# Patient Record
Sex: Female | Born: 1970
Health system: Southern US, Community
[De-identification: ages and names within clinical notes are randomized; demographics above are authoritative.]

## PROBLEM LIST (undated history)

## (undated) DIAGNOSIS — Q231 Congenital insufficiency of aortic valve: Secondary | ICD-10-CM

## (undated) DIAGNOSIS — Q2381 Bicuspid aortic valve: Secondary | ICD-10-CM

## (undated) DIAGNOSIS — E119 Type 2 diabetes mellitus without complications: Secondary | ICD-10-CM

## (undated) DIAGNOSIS — Q230 Congenital stenosis of aortic valve: Secondary | ICD-10-CM

## (undated) DIAGNOSIS — I350 Nonrheumatic aortic (valve) stenosis: Secondary | ICD-10-CM

## (undated) DIAGNOSIS — I10 Essential (primary) hypertension: Secondary | ICD-10-CM

## (undated) DIAGNOSIS — Z5189 Encounter for other specified aftercare: Secondary | ICD-10-CM

## (undated) DIAGNOSIS — R002 Palpitations: Secondary | ICD-10-CM

## (undated) DIAGNOSIS — Z90710 Acquired absence of both cervix and uterus: Secondary | ICD-10-CM

## (undated) HISTORY — DX: Essential (primary) hypertension: I10

## (undated) HISTORY — DX: Encounter for other specified aftercare: Z51.89

## (undated) HISTORY — DX: Bicuspid aortic valve: Q23.81

## (undated) HISTORY — DX: Congenital stenosis of aortic valve: Q23.1

## (undated) HISTORY — PX: TUBAL LIGATION: SHX77

## (undated) HISTORY — DX: Nonrheumatic aortic (valve) stenosis: I35.0

## (undated) HISTORY — DX: Congenital stenosis of aortic valve: Q23.0

## (undated) HISTORY — DX: Palpitations: R00.2

## (undated) HISTORY — PX: ABDOMINAL HYSTERECTOMY: SHX81

## (undated) HISTORY — PX: CARDIAC CATHETERIZATION: SHX172

---

## 2006-06-10 ENCOUNTER — Other Ambulatory Visit: Admission: RE | Admit: 2006-06-10 | Discharge: 2006-06-10 | Payer: Self-pay | Admitting: Unknown Physician Specialty

## 2006-06-10 ENCOUNTER — Encounter (INDEPENDENT_AMBULATORY_CARE_PROVIDER_SITE_OTHER): Payer: Self-pay | Admitting: Unknown Physician Specialty

## 2010-11-27 ENCOUNTER — Encounter: Payer: Self-pay | Admitting: Cardiology

## 2010-11-28 ENCOUNTER — Encounter: Payer: Self-pay | Admitting: Cardiology

## 2010-11-28 ENCOUNTER — Ambulatory Visit (INDEPENDENT_AMBULATORY_CARE_PROVIDER_SITE_OTHER): Payer: Medicaid Other | Admitting: Cardiology

## 2010-11-28 VITALS — BP 125/85 | HR 76 | Ht 69.0 in | Wt 243.0 lb

## 2010-11-28 DIAGNOSIS — E669 Obesity, unspecified: Secondary | ICD-10-CM

## 2010-11-28 DIAGNOSIS — I10 Essential (primary) hypertension: Secondary | ICD-10-CM

## 2010-11-28 DIAGNOSIS — R011 Cardiac murmur, unspecified: Secondary | ICD-10-CM

## 2010-11-28 DIAGNOSIS — Z136 Encounter for screening for cardiovascular disorders: Secondary | ICD-10-CM

## 2010-11-28 DIAGNOSIS — R002 Palpitations: Secondary | ICD-10-CM | POA: Insufficient documentation

## 2010-11-28 DIAGNOSIS — Z6834 Body mass index (BMI) 34.0-34.9, adult: Secondary | ICD-10-CM | POA: Insufficient documentation

## 2010-11-28 NOTE — Assessment & Plan Note (Signed)
Systolic murmur, likely benign. Could not appreciate opening snap and second heart sound is normal. ECG without right bundle branch pattern. Need to exclude bicuspid aortic valve. 2-D echocardiogram will be ordered.

## 2010-11-28 NOTE — Patient Instructions (Signed)
Your follow up will be based on your test results. Your physician recommends that you continue on your current medications as directed. Please refer to the Current Medication list given to you today. Your physician has requested that you have an echocardiogram. Echocardiography is a painless test that uses sound waves to create images of your heart. It provides your doctor with information about the size and shape of your heart and how well your heart's chambers and valves are working. This procedure takes approximately one hour. There are no restrictions for this procedure. If the results of your test are normal or stable, you will receive a letter. If they are abnormal, the nurse will contact you by phone.

## 2010-11-28 NOTE — Assessment & Plan Note (Signed)
Blood pressure is reasonably well controlled today. 

## 2010-11-28 NOTE — Assessment & Plan Note (Signed)
Brief, not associated with dizziness or syncope. Patient states she is not particularly bothered by these. No further evaluation at this point unless symptoms progress.

## 2010-11-28 NOTE — Assessment & Plan Note (Signed)
We did discuss diet and weight loss.

## 2010-11-28 NOTE — Progress Notes (Signed)
Clinical Summary Ms. Cenci is a 40 y.o.female referred for cardiology consultation from the [facility]. She was noted to have a heart murmur on recent examination. History includes hypertension over the last several months by report, recently on antihypertensive medication. She also reports a history of brief palpitations, not associated with dizziness or syncope. She states that this occurs sporadically during the day, and is not particularly bothersome to her.  She is not aware of any history of heart murmur. No family history of congenital heart disease or valvular heart disease by report.  She reports no exertional chest pain or shortness of breath. 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 No orthopnea or PND.  No Known Allergies  Medication list reviewed.  Past Medical History  Diagnosis Date  . Essential hypertension, benign   . Palpitations     Past Surgical History  Procedure Date  . Cesarean section 2009    Family History  Problem Relation Age of Onset  . Diabetes Mother   . Brain cancer Brother   . Cancer Maternal Grandmother     Social History Ms. Stallings reports that she has never smoked. She has never used smokeless tobacco. Ms. Roulhac reports that she does not drink alcohol.  Review of Systems As outlined above. No history of TIA or stroke. No orthopnea or PND. Stable appetite. Otherwise negative.  Physical Examination Filed Vitals:   11/28/10 0857  BP: 125/85  Pulse: 76   Obese woman in no acute distress. HEENT: Conjunctiva and lids normal, oropharynx clear. Neck: Supple, no bruits, no elevated JVP, no thyromegaly. Lungs: Clear to auscultation, nonlabored. Cardiac: Regular rate and rhythm, 2/6 systolic murmur heard best at the right base, no diastolic murmur, no rub or gallop. Does not change in intensity on standing. Abdomen: Soft, nontender, bowel sounds present. Skin: Warm and dry. Extremities: No pitting edema,  distal pulses full. No cyanosis. Musculoskeletal: No kyphosis. Neuropsychiatric: Alert and oriented x3, affect appropriate.  ECG Reviewed in EMR.   Problem List and Plan

## 2010-12-13 ENCOUNTER — Other Ambulatory Visit (INDEPENDENT_AMBULATORY_CARE_PROVIDER_SITE_OTHER): Payer: Medicaid Other | Admitting: *Deleted

## 2010-12-13 DIAGNOSIS — R002 Palpitations: Secondary | ICD-10-CM

## 2010-12-13 DIAGNOSIS — R011 Cardiac murmur, unspecified: Secondary | ICD-10-CM

## 2010-12-17 ENCOUNTER — Telehealth: Payer: Self-pay | Admitting: *Deleted

## 2010-12-17 NOTE — Telephone Encounter (Signed)
Patient informed. 

## 2010-12-17 NOTE — Telephone Encounter (Signed)
Message copied by Eustace Moore on Mon Dec 17, 2010  9:29 AM ------      Message from: Eustace Moore      Created: Fri Dec 14, 2010  4:11 PM                   ----- Message -----         From: Jonelle Sidle, MD         Sent: 12/13/2010   9:40 PM           To: Hoover Brunette, LPN            Reviewed report. Aortic valve is described as trileaflet and mildly calcified, with mildly increased mean gradient of 15 mm mercury. Perhaps very mildly stenotic and an explanation for her murmur, however not of major clinical significance at this time. This can most likely be followed on serial exam by her primary care provider.

## 2012-06-05 ENCOUNTER — Other Ambulatory Visit (HOSPITAL_COMMUNITY): Payer: Self-pay | Admitting: Nurse Practitioner

## 2012-06-05 DIAGNOSIS — Z139 Encounter for screening, unspecified: Secondary | ICD-10-CM

## 2012-06-29 ENCOUNTER — Ambulatory Visit (HOSPITAL_COMMUNITY)
Admission: RE | Admit: 2012-06-29 | Discharge: 2012-06-29 | Disposition: A | Discharge status: Home / Self Care | Payer: Medicaid Other | Source: Ambulatory Visit | Attending: Nurse Practitioner | Admitting: Nurse Practitioner

## 2012-06-29 DIAGNOSIS — Z139 Encounter for screening, unspecified: Secondary | ICD-10-CM

## 2012-06-29 DIAGNOSIS — Z1231 Encounter for screening mammogram for malignant neoplasm of breast: Secondary | ICD-10-CM | POA: Insufficient documentation

## 2013-07-12 ENCOUNTER — Other Ambulatory Visit (HOSPITAL_COMMUNITY): Payer: Self-pay | Admitting: Nurse Practitioner

## 2013-07-12 DIAGNOSIS — Z1231 Encounter for screening mammogram for malignant neoplasm of breast: Secondary | ICD-10-CM

## 2013-07-23 ENCOUNTER — Ambulatory Visit (HOSPITAL_COMMUNITY): Payer: Medicaid Other

## 2013-07-29 ENCOUNTER — Ambulatory Visit (HOSPITAL_COMMUNITY)
Admission: RE | Admit: 2013-07-29 | Discharge: 2013-07-29 | Disposition: A | Discharge status: Home / Self Care | Payer: Medicaid Other | Source: Ambulatory Visit | Attending: Nurse Practitioner | Admitting: Nurse Practitioner

## 2013-07-29 DIAGNOSIS — Z1231 Encounter for screening mammogram for malignant neoplasm of breast: Secondary | ICD-10-CM | POA: Diagnosis not present

## 2013-10-27 ENCOUNTER — Ambulatory Visit (INDEPENDENT_AMBULATORY_CARE_PROVIDER_SITE_OTHER): Payer: BC Managed Care – PPO | Admitting: Cardiology

## 2013-10-27 ENCOUNTER — Encounter: Payer: Self-pay | Admitting: Cardiology

## 2013-10-27 VITALS — BP 128/78 | HR 82 | Ht 68.0 in | Wt 223.1 lb

## 2013-10-27 DIAGNOSIS — I10 Essential (primary) hypertension: Secondary | ICD-10-CM

## 2013-10-27 DIAGNOSIS — I350 Nonrheumatic aortic (valve) stenosis: Secondary | ICD-10-CM | POA: Insufficient documentation

## 2013-10-27 NOTE — Progress Notes (Signed)
   Anna Salinas Date of Birth: [birth year] Medical Record #[id number]  History of Present Illness: Anna Salinas is seen today for evaluation of murmur. She is a 43 yo WF with history of HTN and obesity. She has a history of a murmur evaluated by Dr Domenic Polite in 2012. An Echo was obtained which showed calcification of the aortic valve which appeared trileaflet. Mean gradient was 15 mm Hg. Peak of 28 mm Hg. One month ago she had a tubal ligation and was told that her murmur was worse. She does complain of SOB if she walks up steps or with more than usual exertion. Occasional heartburn. No dizziness. No syncope. Has regular dental follow up.    Medication List       This list is accurate as of: 10/27/13  5:22 PM.  Always use your most recent med list.               lisinopril-hydrochlorothiazide 10-12.5 MG per tablet  Commonly known as:  PRINZIDE,ZESTORETIC  Take 1 tablet by mouth daily.         Allergies  Allergen Reactions  . Glipizide Nausea And Vomiting and Other (See Comments)    GI upset    Past Medical History  Diagnosis Date  . Essential hypertension, benign   . Palpitations   . Prediabetes     Past Surgical History  Procedure Laterality Date  . Cesarean section  2009  . Tubal ligation      History   Social History  . Marital Status: Married    Spouse Name: N/A    Number of Children: 3  . Years of Education: N/A   Occupational History  . cashier    Social History Main Topics  . Smoking status: Never Smoker   . Smokeless tobacco: Never Used  . Alcohol Use: No  . Drug Use: No  . Sexual Activity: None   Other Topics Concern  . None   Social History Narrative  . None    Family History  Problem Relation Age of Onset  . Diabetes Mother   . Brain cancer Brother   . Cancer Maternal Grandmother     Review of Systems: As noted in HPI.  All other systems were reviewed and are negative.  Physical Exam: BP 128/78  Pulse 82  Ht 5\' 8"  (1.727 m)   Wt 223 lb 1.6 oz (101.197 kg)  BMI 33.93 kg/m2 Filed Weights   10/27/13 1507  Weight: 223 lb 1.6 oz (101.197 kg)  GENERAL:  Well appearing, obese WF in NAD HEENT:  PERRL, EOMI, sclera are clear. Oropharynx is clear. NECK:  No jugular venous distention, carotid upstroke brisk and symmetric, no bruits, no thyromegaly or adenopathy LUNGS:  Clear to auscultation bilaterally CHEST:  Unremarkable HEART:  RRR,  PMI not displaced or sustained,S1 and S2 within normal limits, no S3, no S4: there is a grade 2/6 harsh systolic murmur noted at the RUSB. No diastolic murmur.  ABD:  Soft, nontender. BS +, no masses or bruits. No hepatomegaly, no splenomegaly EXT:  2 + pulses throughout, no edema, no cyanosis no clubbing SKIN:  Warm and dry.  No rashes NEURO:  Alert and oriented x 3. Cranial nerves II through XII intact. PSYCH:  Cognitively intact    LABORATORY DATA: Ecg: NSR with minimal nonspecific ST abnormality.  Assessment / Plan: 1. Aortic stenosis. Mild by Echo in 2012. Class 1-2 symptoms. Will update Echo and compare to 2012.  2. HTN controlled.

## 2013-10-27 NOTE — Patient Instructions (Signed)
We will schedule you for an Echocardiogram   

## 2013-11-01 ENCOUNTER — Ambulatory Visit (HOSPITAL_COMMUNITY)
Admission: RE | Admit: 2013-11-01 | Discharge: 2013-11-01 | Disposition: A | Discharge status: Home / Self Care | Payer: BC Managed Care – PPO | Source: Ambulatory Visit | Attending: Cardiology | Admitting: Cardiology

## 2013-11-01 DIAGNOSIS — R011 Cardiac murmur, unspecified: Secondary | ICD-10-CM | POA: Diagnosis not present

## 2013-11-01 DIAGNOSIS — I359 Nonrheumatic aortic valve disorder, unspecified: Secondary | ICD-10-CM

## 2013-11-01 DIAGNOSIS — I10 Essential (primary) hypertension: Secondary | ICD-10-CM | POA: Diagnosis not present

## 2013-11-01 DIAGNOSIS — I350 Nonrheumatic aortic (valve) stenosis: Secondary | ICD-10-CM

## 2013-11-01 NOTE — Progress Notes (Signed)
2D Echo Performed 11/01/2013    Marygrace Drought, RCS

## 2013-11-08 ENCOUNTER — Ambulatory Visit: Payer: BC Managed Care – PPO | Admitting: Cardiology

## 2013-11-09 ENCOUNTER — Ambulatory Visit (INDEPENDENT_AMBULATORY_CARE_PROVIDER_SITE_OTHER): Payer: BC Managed Care – PPO | Admitting: Cardiology

## 2013-11-09 ENCOUNTER — Encounter: Payer: Self-pay | Admitting: Cardiology

## 2013-11-09 VITALS — BP 114/62 | HR 92 | Ht 68.0 in | Wt 225.8 lb

## 2013-11-09 DIAGNOSIS — I10 Essential (primary) hypertension: Secondary | ICD-10-CM

## 2013-11-09 DIAGNOSIS — I350 Nonrheumatic aortic (valve) stenosis: Secondary | ICD-10-CM

## 2013-11-09 NOTE — Patient Instructions (Signed)
We will see you back in January to arrange a cardiac cath

## 2013-11-09 NOTE — Progress Notes (Signed)
Anna Salinas Date of Birth: 04/02/1970 Medical Record #[id number]  History of Present Illness: Anna Salinas is seen today for follow up of her Echo. She is a 43 yo WF with history of HTN and obesity. She has a history of a murmur evaluated by Dr Domenic Polite in 2012. An Echo was obtained which showed calcification of the aortic valve which appeared trileaflet. Mean gradient was 15 mm Hg. Peak of 28 mm Hg. One month ago she had a tubal ligation and was told that her murmur was worse. She does complain of SOB if she walks up steps or with more than usual exertion. This has been noticeable over the past 2 months. Occasional heartburn. No dizziness. No syncope. Has regular dental follow up.    Medication List       This list is accurate as of: 11/09/13 12:09 PM.  Always use your most recent med list.               lisinopril-hydrochlorothiazide 10-12.5 MG per tablet  Commonly known as:  PRINZIDE,ZESTORETIC  Take 1 tablet by mouth daily.         Allergies  Allergen Reactions  . Glipizide Nausea And Vomiting and Other (See Comments)    GI upset    Past Medical History  Diagnosis Date  . Essential hypertension, benign   . Palpitations   . Prediabetes   . Aortic stenosis due to bicuspid aortic valve     Past Surgical History  Procedure Laterality Date  . Cesarean section  2009  . Tubal ligation      History   Social History  . Marital Status: Married    Spouse Name: N/A    Number of Children: 3  . Years of Education: N/A   Occupational History  . cashier    Social History Main Topics  . Smoking status: Never Smoker   . Smokeless tobacco: Never Used  . Alcohol Use: No  . Drug Use: No  . Sexual Activity: None   Other Topics Concern  . None   Social History Narrative  . None    Family History  Problem Relation Age of Onset  . Diabetes Mother   . Brain cancer Brother   . Cancer Maternal Grandmother     Review of Systems: As noted in HPI.  All other  systems were reviewed and are negative.  Physical Exam: BP 114/62  Pulse 92  Ht 5\' 8"  (1.727 m)  Wt 225 lb 12.8 oz (102.422 kg)  BMI 34.34 kg/m2 Filed Weights   11/09/13 1116  Weight: 225 lb 12.8 oz (102.422 kg)  GENERAL:  Well appearing, obese WF in NAD HEENT:  PERRL, EOMI, sclera are clear. Oropharynx is clear. NECK:  No jugular venous distention, carotid upstroke brisk and symmetric, no bruits, no thyromegaly or adenopathy LUNGS:  Clear to auscultation bilaterally CHEST:  Unremarkable HEART:  RRR,  PMI not displaced or sustained,S1 and S2 within normal limits, no S3, no S4: there is a harsh grade 2/6 harsh systolic murmur noted at the RUSB. No diastolic murmur.  ABD:  Soft, nontender. BS +, no masses or bruits. No hepatomegaly, no splenomegaly EXT:  2 + pulses throughout, no edema, no cyanosis no clubbing SKIN:  Warm and dry.  No rashes NEURO:  Alert and oriented x 3. Cranial nerves II through XII intact. PSYCH:  Cognitively intact    LABORATORY DATA: Echo:11/01/13:Study Conclusions  - Left ventricle: The cavity size was normal. Systolic function was vigorous. The  estimated ejection fraction was in the range of 65% to 70%. Wall motion was normal; there were no regional wall motion abnormalities. Doppler parameters are consistent with abnormal left ventricular relaxation (grade 1 diastolic dysfunction). - Aortic valve: A bicuspid morphology cannot be excluded; moderately thickened, moderately calcified leaflets. Valve mobility was restricted. There was moderate to severe stenosis. Peak velocity (S): 412 cm/s. Mean gradient (S): 31 mm Hg. Valve area (Vmax): 0.95 cm^2.  Impressions:  - When compared to 2012 echocardiogram, aortic stenosis has advanced (previously peak velocity 2.36m/s).  Transthoracic echocardiography. M-mode, complete 2D, spectral Doppler, and color Doppler. Birthdate: Patient birthdate: 1970-02-19. Age: Patient is 43 yr old. Sex: Gender: female. BMI:  33.9 kg/m^2. Blood pressure: 128/78 Patient status: Outpatient. Study date: Study date: 11/01/2013. Study time: 02:51 PM. Location: Echo laboratory.  -------------------------------------------------------------------  ------------------------------------------------------------------- Left ventricle: The cavity size was normal. Systolic function was vigorous. The estimated ejection fraction was in the range of 65% to 70%. Wall motion was normal; there were no regional wall motion abnormalities. The transmitral flow pattern was normal. The deceleration time of the early transmitral flow velocity was normal. The pulmonary vein flow pattern was normal. The tissue Doppler parameters were normal. Doppler parameters are consistent with abnormal left ventricular relaxation (grade 1 diastolic dysfunction).  ------------------------------------------------------------------- Aortic valve: A bicuspid morphology cannot be excluded; moderately thickened, moderately calcified leaflets. Valve mobility was restricted. Doppler: There was moderate to severe stenosis. There was no regurgitation. Valve area (VTI): 0.82 cm^2. Indexed valve area (VTI): 0.37 cm^2/m^2. Peak velocity ratio of LVOT to aortic valve: 0.3. Valve area (Vmax): 0.95 cm^2. Indexed valve area (Vmax): 0.43 cm^2/m^2. Mean gradient (S): 31 mm Hg. Peak gradient (S): 68 mm Hg.  ------------------------------------------------------------------- Aorta: The aorta was normal, not dilated, and non-diseased.  ------------------------------------------------------------------- Mitral valve: Structurally normal valve. Leaflet separation was normal. Doppler: Transvalvular velocity was within the normal range. There was no evidence for stenosis. There was no regurgitation. Peak gradient (D): 4 mm Hg.  ------------------------------------------------------------------- Left atrium: LA volume/ BSA=15.9 ml/m2. The atrium was normal  in size.  ------------------------------------------------------------------- Right ventricle: The cavity size was normal. Wall thickness was normal. Systolic function was normal.  ------------------------------------------------------------------- Pulmonic valve: Poorly visualized. Structurally normal valve. Cusp separation was normal. Doppler: Transvalvular velocity was within the normal range. There was trivial regurgitation.  ------------------------------------------------------------------- Tricuspid valve: Structurally normal valve. Leaflet separation was normal. Doppler: Transvalvular velocity was within the normal range. There was trivial regurgitation.  ------------------------------------------------------------------- Pulmonary artery: The main pulmonary artery was normal-sized.  ------------------------------------------------------------------- Right atrium: The atrium was normal in size.  ------------------------------------------------------------------- Pericardium: The pericardium was normal in appearance. There was no pericardial effusion.  ------------------------------------------------------------------- Systemic veins: Inferior vena cava: The vessel was normal in size. The respirophasic diameter changes were in the normal range (= 50%), consistent with normal central venous pressure. Diameter: 11 mm.  ------------------------------------------------------------------- Post procedure conclusions Ascending Aorta:  - The aorta was normal, not dilated, and non-diseased.  ------------------------------------------------------------------- Measurements  IVC Value Reference ID 11 mm ---------  Left ventricle Value Reference LV ID, ED, PLAX chordal (L) 34.4 mm 43 - 52 LV ID, ES, PLAX chordal 23.2 mm 23 - 38 LV fx shortening, PLAX chordal 33 % >=29 LV PW thickness, ED 9.82 mm --------- IVS/LV PW ratio, ED 0.7 <=1.3 Stroke volume, 2D 77 ml  --------- Stroke volume/bsa, 2D 34 ml/m^2 --------- LV e&', lateral 14.3 cm/s --------- LV E/e&', lateral 6.7 --------- LV e&', medial 8.99 cm/s --------- LV E/e&', medial 10.66 --------- LV e&', average 11.65 cm/s ---------  LV E/e&', average 8.23 ---------  Ventricular septum Value Reference IVS thickness, ED 6.86 mm ---------  LVOT Value Reference LVOT ID, S 20 mm --------- LVOT area 3.14 cm^2 --------- LVOT peak velocity, S 125 cm/s --------- LVOT peak gradient, S 6 mm Hg ---------  Aortic valve Value Reference Aortic valve peak velocity, S 412 cm/s --------- Aortic mean gradient, S 31 mm Hg --------- Aortic peak gradient, S 68 mm Hg --------- Aortic valve area, VTI 0.82 cm^2 --------- Aortic valve area/bsa, VTI 0.37 cm^2/m^2 --------- Velocity ratio, peak, LVOT/AV 0.3 --------- Aortic valve area, peak velocity 0.95 cm^2 --------- Aortic valve area/bsa, peak 0.43 cm^2/m^2 --------- velocity  Aorta Value Reference Aortic root ID, ED 30 mm ---------  Left atrium Value Reference LA ID, A-P, ES 33 mm --------- LA ID/bsa, A-P 1.47 cm/m^2 <=2.2 LA volume, ES, 1-p A4C 29 ml --------- LA volume/bsa, ES, 1-p A4C 12.9 ml/m^2 --------- LA volume, ES, 1-p A2C 33 ml --------- LA volume/bsa, ES, 1-p A2C 14.7 ml/m^2 ---------  Mitral valve Value Reference Mitral E-wave peak velocity 95.8 cm/s --------- Mitral A-wave peak velocity 70.1 cm/s --------- Mitral deceleration time 208 ms 150 - 230 Mitral peak gradient, D 4 mm Hg --------- Mitral E/A ratio, peak 1.4 ---------  Pulmonary arteries Value Reference PA pressure, S, DP 16 mm Hg <=30  Tricuspid valve Value Reference Tricuspid regurg peak velocity 182 cm/s --------- Tricuspid peak RV-RA gradient 13 mm Hg --------- Tricuspid maximal regurg 182 cm/s --------- velocity, PISA  Systemic veins Value Reference Estimated CVP 3 mm Hg ---------  Right ventricle Value Reference RV pressure, S, DP 16 mm Hg <=30 RV s&', lateral,  S 13.9 cm/s ---------  Legend: (L) and (H) mark values outside specified reference range.  ------------------------------------------------------------------- Prepared and Electronically Authenticated by  Candee Furbish, M.D. 2015-10-19T18:06:05   Assessment / Plan: 1. Aortic stenosis. Now severe. Stenosis has progressed significantly over the past 3 years with doubling of her valve gradient and now velocity greater than 4 m/sec. She does have mild symptoms. I have personally reviewed her Echo images and I think this is a functionally bicuspid valve. Given the significant progression from 2012 I have informed the patient that she will need to consider AV replacement in the near future. We discussed that this is a progressive process and discussed the natural history. At her age she will need a mechanical valve. She is not able to have more children with prior tubal ligation. We discussed the need for coumadin long term. Her family was present and very supportive. Will tentatively plan on reevaluation at the beginning of the year with a right and left heart cath. The procedure and risks were reviewed including but not limited to death, myocardial infarction, stroke, arrythmias, bleeding, transfusion, emergency surgery, dye allergy, or renal dysfunction. The patient voices understanding and is agreeable to proceed. Following cardiac cath will arrange evaluation with CT surgery.   2. HTN controlled.

## 2014-01-17 ENCOUNTER — Telehealth: Payer: Self-pay | Admitting: Nurse Practitioner

## 2014-01-17 NOTE — Telephone Encounter (Signed)
Pt requesting new pt appt, given with MMM 2/15 @10 :15.

## 2014-01-21 ENCOUNTER — Other Ambulatory Visit: Payer: Self-pay | Admitting: Cardiology

## 2014-01-21 ENCOUNTER — Ambulatory Visit
Admission: RE | Admit: 2014-01-21 | Discharge: 2014-01-21 | Disposition: A | Discharge status: Home / Self Care | Payer: BLUE CROSS/BLUE SHIELD | Source: Ambulatory Visit | Attending: Cardiology | Admitting: Cardiology

## 2014-01-21 ENCOUNTER — Encounter: Payer: Self-pay | Admitting: Cardiology

## 2014-01-21 ENCOUNTER — Ambulatory Visit (INDEPENDENT_AMBULATORY_CARE_PROVIDER_SITE_OTHER): Payer: BC Managed Care – PPO | Admitting: Cardiology

## 2014-01-21 VITALS — BP 102/70 | HR 74 | Ht 67.0 in | Wt 223.1 lb

## 2014-01-21 DIAGNOSIS — I10 Essential (primary) hypertension: Secondary | ICD-10-CM

## 2014-01-21 DIAGNOSIS — I350 Nonrheumatic aortic (valve) stenosis: Secondary | ICD-10-CM

## 2014-01-21 LAB — BASIC METABOLIC PANEL
BUN: 13 mg/dL (ref 6–23)
CO2: 22 mEq/L (ref 19–32)
Calcium: 8.9 mg/dL (ref 8.4–10.5)
Chloride: 97 mEq/L (ref 96–112)
Creat: 0.76 mg/dL (ref 0.50–1.10)
Glucose, Bld: 348 mg/dL — ABNORMAL HIGH (ref 70–99)
POTASSIUM: 3.9 meq/L (ref 3.5–5.3)
SODIUM: 134 meq/L — AB (ref 135–145)

## 2014-01-21 LAB — PROTIME-INR
INR: 1.08 (ref <1.50)
Prothrombin Time: 14 seconds (ref 11.6–15.2)

## 2014-01-21 LAB — CBC WITH DIFFERENTIAL/PLATELET
BASOS ABS: 0.1 10*3/uL (ref 0.0–0.1)
BASOS PCT: 1 % (ref 0–1)
Eosinophils Absolute: 0.2 10*3/uL (ref 0.0–0.7)
Eosinophils Relative: 2 % (ref 0–5)
HEMATOCRIT: 44.4 % (ref 36.0–46.0)
Hemoglobin: 14.6 g/dL (ref 12.0–15.0)
LYMPHS ABS: 2.3 10*3/uL (ref 0.7–4.0)
LYMPHS PCT: 30 % (ref 12–46)
MCH: 29.1 pg (ref 26.0–34.0)
MCHC: 32.9 g/dL (ref 30.0–36.0)
MCV: 88.6 fL (ref 78.0–100.0)
MONOS PCT: 5 % (ref 3–12)
Monocytes Absolute: 0.4 10*3/uL (ref 0.1–1.0)
Neutro Abs: 4.7 10*3/uL (ref 1.7–7.7)
Neutrophils Relative %: 62 % (ref 43–77)
Platelets: 200 10*3/uL (ref 150–400)
RBC: 5.01 MIL/uL (ref 3.87–5.11)
RDW: 13.5 % (ref 11.5–15.5)
WBC: 7.5 10*3/uL (ref 4.0–10.5)

## 2014-01-21 NOTE — Progress Notes (Signed)
Anna Salinas Date of Birth: August 13, 1970 Medical Record #[id number]  History of Present Illness: Anna Salinas is seen today for follow up of aortic stenosis. She is a 44 yo WF with history of HTN and obesity. She has a history of a murmur evaluated by Dr Domenic Polite in 2012. An Echo was obtained which showed calcification of the aortic valve which appeared trileaflet. Mean gradient was 15 mm Hg. Peak of 28 mm Hg. When she had a tubal ligation she was told that her murmur was worse. Repeat Echo showed a bicuspid valve with severe aortic stenosis. She does complain of SOB if she walks up steps or with more than usual exertion. This has been noticeable over the past 5 months. Occasional dyspnea and lightheadedness. Has regular dental follow up. She is now prepared to proceed with cardiac cath.    Medication List       This list is accurate as of: 01/21/14  9:04 AM.  Always use your most recent med list.               lisinopril-hydrochlorothiazide 10-12.5 MG per tablet  Commonly known as:  PRINZIDE,ZESTORETIC  Take 1 tablet by mouth daily.         Allergies  Allergen Reactions  . Glipizide Nausea And Vomiting and Other (See Comments)    GI upset    Past Medical History  Diagnosis Date  . Essential hypertension, benign   . Palpitations   . Prediabetes   . Aortic stenosis due to bicuspid aortic valve     Past Surgical History  Procedure Laterality Date  . Cesarean section  2009  . Tubal ligation      History   Social History  . Marital Status: Married    Spouse Name: N/A    Number of Children: 3  . Years of Education: N/A   Occupational History  . cashier    Social History Main Topics  . Smoking status: Never Smoker   . Smokeless tobacco: Never Used  . Alcohol Use: No  . Drug Use: No  . Sexual Activity: None   Other Topics Concern  . None   Social History Narrative    Family History  Problem Relation Age of Onset  . Diabetes Mother   . Brain cancer  Brother   . Cancer Maternal Grandmother     Review of Systems: As noted in HPI.  All other systems were reviewed and are negative.  Physical Exam: BP 102/70 mmHg  Pulse 74  Ht 5\' 7"  (1.702 m)  Wt 223 lb 1 oz (101.18 kg)  BMI 34.93 kg/m2 Filed Weights   01/21/14 0833  Weight: 223 lb 1 oz (101.18 kg)  GENERAL:  Well appearing, obese WF in NAD HEENT:  PERRL, EOMI, sclera are clear. Oropharynx is clear. NECK:  No jugular venous distention, carotid upstroke mildly reduced, no bruits, no thyromegaly or adenopathy LUNGS:  Clear to auscultation bilaterally CHEST:  Unremarkable HEART:  RRR,  PMI not displaced or sustained,S1 and S2 within normal limits, no S3, no S4: there is a harsh grade 2/6 harsh systolic murmur noted at the RUSB radiating to carotids. No diastolic murmur.  ABD:  Soft, nontender. BS +, no masses or bruits. No hepatomegaly, no splenomegaly EXT:  2 + pulses throughout, no edema, no cyanosis no clubbing SKIN:  Warm and dry.  No rashes NEURO:  Alert and oriented x 3. Cranial nerves II through XII intact. PSYCH:  Cognitively intact    LABORATORY DATA:  Echo:11/01/13:Study Conclusions  - Left ventricle: The cavity size was normal. Systolic function was vigorous. The estimated ejection fraction was in the range of 65% to 70%. Wall motion was normal; there were no regional wall motion abnormalities. Doppler parameters are consistent with abnormal left ventricular relaxation (grade 1 diastolic dysfunction). - Aortic valve: A bicuspid morphology cannot be excluded; moderately thickened, moderately calcified leaflets. Valve mobility was restricted. There was moderate to severe stenosis. Peak velocity (S): 412 cm/s. Mean gradient (S): 31 mm Hg. Valve area (Vmax): 0.95 cm^2.  Impressions:  - When compared to 2012 echocardiogram, aortic stenosis has advanced (previously peak velocity 2.57m/s).  Transthoracic echocardiography. M-mode, complete 2D, spectral Doppler, and  color Doppler. Birthdate: Patient birthdate: 11/14/1970. Age: Patient is 44 yr old. Sex: Gender: female. BMI: 33.9 kg/m^2. Blood pressure: 128/78 Patient status: Outpatient. Study date: Study date: 11/01/2013. Study time: 02:51 PM. Location: Echo laboratory.  -------------------------------------------------------------------  ------------------------------------------------------------------- Left ventricle: The cavity size was normal. Systolic function was vigorous. The estimated ejection fraction was in the range of 65% to 70%. Wall motion was normal; there were no regional wall motion abnormalities. The transmitral flow pattern was normal. The deceleration time of the early transmitral flow velocity was normal. The pulmonary vein flow pattern was normal. The tissue Doppler parameters were normal. Doppler parameters are consistent with abnormal left ventricular relaxation (grade 1 diastolic dysfunction).  ------------------------------------------------------------------- Aortic valve: A bicuspid morphology cannot be excluded; moderately thickened, moderately calcified leaflets. Valve mobility was restricted. Doppler: There was moderate to severe stenosis. There was no regurgitation. Valve area (VTI): 0.82 cm^2. Indexed valve area (VTI): 0.37 cm^2/m^2. Peak velocity ratio of LVOT to aortic valve: 0.3. Valve area (Vmax): 0.95 cm^2. Indexed valve area (Vmax): 0.43 cm^2/m^2. Mean gradient (S): 31 mm Hg. Peak gradient (S): 68 mm Hg.  ------------------------------------------------------------------- Aorta: The aorta was normal, not dilated, and non-diseased.  ------------------------------------------------------------------- Mitral valve: Structurally normal valve. Leaflet separation was normal. Doppler: Transvalvular velocity was within the normal range. There was no evidence for stenosis. There was no regurgitation. Peak gradient (D): 4 mm  Hg.  ------------------------------------------------------------------- Left atrium: LA volume/ BSA=15.9 ml/m2. The atrium was normal in size.  ------------------------------------------------------------------- Right ventricle: The cavity size was normal. Wall thickness was normal. Systolic function was normal.  ------------------------------------------------------------------- Pulmonic valve: Poorly visualized. Structurally normal valve. Cusp separation was normal. Doppler: Transvalvular velocity was within the normal range. There was trivial regurgitation.  ------------------------------------------------------------------- Tricuspid valve: Structurally normal valve. Leaflet separation was normal. Doppler: Transvalvular velocity was within the normal range. There was trivial regurgitation.  ------------------------------------------------------------------- Pulmonary artery: The main pulmonary artery was normal-sized.  ------------------------------------------------------------------- Right atrium: The atrium was normal in size.  ------------------------------------------------------------------- Pericardium: The pericardium was normal in appearance. There was no pericardial effusion.  ------------------------------------------------------------------- Systemic veins: Inferior vena cava: The vessel was normal in size. The respirophasic diameter changes were in the normal range (= 50%), consistent with normal central venous pressure. Diameter: 11 mm.  ------------------------------------------------------------------- Post procedure conclusions Ascending Aorta:  - The aorta was normal, not dilated, and non-diseased.  ------------------------------------------------------------------- Measurements  IVC Value Reference ID 11 mm ---------  Left ventricle Value Reference LV ID, ED, PLAX chordal (L) 34.4 mm 43 - 52 LV ID, ES, PLAX chordal 23.2 mm 23 - 38 LV fx  shortening, PLAX chordal 33 % >=29 LV PW thickness, ED 9.82 mm --------- IVS/LV PW ratio, ED 0.7 <=1.3 Stroke volume, 2D 77 ml --------- Stroke volume/bsa, 2D 34 ml/m^2 --------- LV e&', lateral 14.3 cm/s --------- LV E/e&', lateral 6.7 ---------  LV e&', medial 8.99 cm/s --------- LV E/e&', medial 10.66 --------- LV e&', average 11.65 cm/s --------- LV E/e&', average 8.23 ---------  Ventricular septum Value Reference IVS thickness, ED 6.86 mm ---------  LVOT Value Reference LVOT ID, S 20 mm --------- LVOT area 3.14 cm^2 --------- LVOT peak velocity, S 125 cm/s --------- LVOT peak gradient, S 6 mm Hg ---------  Aortic valve Value Reference Aortic valve peak velocity, S 412 cm/s --------- Aortic mean gradient, S 31 mm Hg --------- Aortic peak gradient, S 68 mm Hg --------- Aortic valve area, VTI 0.82 cm^2 --------- Aortic valve area/bsa, VTI 0.37 cm^2/m^2 --------- Velocity ratio, peak, LVOT/AV 0.3 --------- Aortic valve area, peak velocity 0.95 cm^2 --------- Aortic valve area/bsa, peak 0.43 cm^2/m^2 --------- velocity  Aorta Value Reference Aortic root ID, ED 30 mm ---------  Left atrium Value Reference LA ID, A-P, ES 33 mm --------- LA ID/bsa, A-P 1.47 cm/m^2 <=2.2 LA volume, ES, 1-p A4C 29 ml --------- LA volume/bsa, ES, 1-p A4C 12.9 ml/m^2 --------- LA volume, ES, 1-p A2C 33 ml --------- LA volume/bsa, ES, 1-p A2C 14.7 ml/m^2 ---------  Mitral valve Value Reference Mitral E-wave peak velocity 95.8 cm/s --------- Mitral A-wave peak velocity 70.1 cm/s --------- Mitral deceleration time 208 ms 150 - 230 Mitral peak gradient, D 4 mm Hg --------- Mitral E/A ratio, peak 1.4 ---------  Pulmonary arteries Value Reference PA pressure, S, DP 16 mm Hg <=30  Tricuspid valve Value Reference Tricuspid regurg peak velocity 182 cm/s --------- Tricuspid peak RV-RA gradient 13 mm Hg --------- Tricuspid maximal regurg 182 cm/s --------- velocity, PISA  Systemic veins Value  Reference Estimated CVP 3 mm Hg ---------  Right ventricle Value Reference RV pressure, S, DP 16 mm Hg <=30 RV s&', lateral, S 13.9 cm/s ---------  Legend: (L) and (H) mark values outside specified reference range.  ------------------------------------------------------------------- Prepared and Electronically Authenticated by  Candee Furbish, M.D. 2015-10-19T18:06:05  Ecg: 10/27/13 NSR with nonspecific ST abnormality.  Assessment / Plan: 1. Aortic stenosis. Now severe. Stenosis has progressed significantly over the past 3 years with doubling of her valve gradient and now velocity greater than 4 m/sec. She does have mild symptoms. I have personally reviewed her Echo images and I think this is a functionally bicuspid valve. Given the significant progression from 2012 I have informed the patient that she will need to consider AV replacement in the near future. We discussed that this is a progressive process and discussed the natural history. At her age she will need a mechanical valve. She is not able to have more children with prior tubal ligation. We discussed the need for coumadin long term. Her family was present and very supportive. Will proceed now with right and left heart cath. The procedure and risks were reviewed including but not limited to death, myocardial infarction, stroke, arrythmias, bleeding, transfusion, emergency surgery, dye allergy, or renal dysfunction. The patient voices understanding and is agreeable to proceed. Following cardiac cath will arrange evaluation with CT surgery.   2. HTN controlled.

## 2014-01-25 ENCOUNTER — Ambulatory Visit (HOSPITAL_COMMUNITY)
Admission: RE | Admit: 2014-01-25 | Discharge: 2014-01-25 | Disposition: A | Discharge status: Home / Self Care | Payer: BLUE CROSS/BLUE SHIELD | Source: Ambulatory Visit | Attending: Cardiology | Admitting: Cardiology

## 2014-01-25 ENCOUNTER — Encounter (HOSPITAL_COMMUNITY): Payer: Self-pay | Admitting: Cardiology

## 2014-01-25 ENCOUNTER — Encounter (HOSPITAL_COMMUNITY)
Admission: RE | Disposition: A | Discharge status: Home / Self Care | Payer: Self-pay | Source: Ambulatory Visit | Attending: Cardiology

## 2014-01-25 DIAGNOSIS — I10 Essential (primary) hypertension: Secondary | ICD-10-CM | POA: Diagnosis not present

## 2014-01-25 DIAGNOSIS — Z6834 Body mass index (BMI) 34.0-34.9, adult: Secondary | ICD-10-CM | POA: Insufficient documentation

## 2014-01-25 DIAGNOSIS — E669 Obesity, unspecified: Secondary | ICD-10-CM | POA: Diagnosis not present

## 2014-01-25 DIAGNOSIS — I350 Nonrheumatic aortic (valve) stenosis: Secondary | ICD-10-CM | POA: Diagnosis present

## 2014-01-25 HISTORY — PX: LEFT AND RIGHT HEART CATHETERIZATION WITH CORONARY ANGIOGRAM: SHX5449

## 2014-01-25 LAB — POCT I-STAT 3, VENOUS BLOOD GAS (G3P V)
BICARBONATE: 24.8 meq/L — AB (ref 20.0–24.0)
O2 Saturation: 70 %
PCO2 VEN: 42 mmHg — AB (ref 45.0–50.0)
TCO2: 26 mmol/L (ref 0–100)
pH, Ven: 7.38 — ABNORMAL HIGH (ref 7.250–7.300)
pO2, Ven: 38 mmHg (ref 30.0–45.0)

## 2014-01-25 LAB — POCT I-STAT 3, ART BLOOD GAS (G3+)
Bicarbonate: 24.2 mEq/L — ABNORMAL HIGH (ref 20.0–24.0)
O2 Saturation: 98 %
TCO2: 25 mmol/L (ref 0–100)
pCO2 arterial: 36.4 mmHg (ref 35.0–45.0)
pH, Arterial: 7.432 (ref 7.350–7.450)
pO2, Arterial: 97 mmHg (ref 80.0–100.0)

## 2014-01-25 LAB — HEMOGLOBIN A1C
Hgb A1c MFr Bld: 10.7 % — ABNORMAL HIGH (ref <5.7)
Mean Plasma Glucose: 260 mg/dL — ABNORMAL HIGH (ref <117)

## 2014-01-25 LAB — GLUCOSE, CAPILLARY: Glucose-Capillary: 258 mg/dL — ABNORMAL HIGH (ref 70–99)

## 2014-01-25 SURGERY — LEFT AND RIGHT HEART CATHETERIZATION WITH CORONARY ANGIOGRAM
Anesthesia: LOCAL

## 2014-01-25 MED ORDER — SODIUM CHLORIDE 0.9 % IJ SOLN: 3.0000 mL | INTRAMUSCULAR | Status: DC | PRN

## 2014-01-25 MED ORDER — HEPARIN (PORCINE) IN NACL 2-0.9 UNIT/ML-% IJ SOLN
INTRAMUSCULAR | Status: AC
  Filled 2014-01-25: qty 1000

## 2014-01-25 MED ORDER — HEPARIN SODIUM (PORCINE) 1000 UNIT/ML IJ SOLN
INTRAMUSCULAR | Status: AC
  Filled 2014-01-25: qty 1

## 2014-01-25 MED ORDER — SODIUM CHLORIDE 0.9 % IV SOLN: 250.0000 mL | INTRAVENOUS | Status: DC | PRN

## 2014-01-25 MED ORDER — LIDOCAINE HCL (PF) 1 % IJ SOLN
INTRAMUSCULAR | Status: AC
  Filled 2014-01-25: qty 30

## 2014-01-25 MED ORDER — FENTANYL CITRATE 0.05 MG/ML IJ SOLN
INTRAMUSCULAR | Status: AC
  Filled 2014-01-25: qty 2

## 2014-01-25 MED ORDER — SODIUM CHLORIDE 0.9 % IV SOLN
INTRAVENOUS | Status: DC
  Administered 2014-01-25: 08:00:00 via INTRAVENOUS

## 2014-01-25 MED ORDER — SODIUM CHLORIDE 0.9 % IJ SOLN: 3.0000 mL | Freq: Two times a day (BID) | INTRAMUSCULAR | Status: DC

## 2014-01-25 MED ORDER — MIDAZOLAM HCL 2 MG/2ML IJ SOLN
INTRAMUSCULAR | Status: AC
  Filled 2014-01-25: qty 2

## 2014-01-25 MED ORDER — SODIUM CHLORIDE 0.9 % IV SOLN: 1.0000 mL/kg/h | INTRAVENOUS | Status: DC

## 2014-01-25 MED ORDER — VERAPAMIL HCL 2.5 MG/ML IV SOLN
INTRAVENOUS | Status: AC
  Filled 2014-01-25: qty 2

## 2014-01-25 NOTE — Discharge Instructions (Signed)
Radial Site Care °Refer to this sheet in the next few weeks. These instructions provide you with information on caring for yourself after your procedure. Your caregiver may also give you more specific instructions. Your treatment has been planned according to current medical practices, but problems sometimes occur. Call your caregiver if you have any problems or questions after your procedure. °HOME CARE INSTRUCTIONS °· You may shower the day after the procedure. Remove the bandage (dressing) and gently wash the site with plain soap and water. Gently pat the site dry. °· Do not apply powder or lotion to the site. °· Do not submerge the affected site in water for 3 to 5 days. °· Inspect the site at least twice daily. °· Do not flex or bend the affected arm for 24 hours. °· No lifting over 5 pounds (2.3 kg) for 5 days after your procedure. °· Do not drive home if you are discharged the same day of the procedure. Have someone else drive you. °· You may drive 24 hours after the procedure unless otherwise instructed by your caregiver. °· Do not operate machinery or power tools for 24 hours. °· A responsible adult should be with you for the first 24 hours after you arrive home. °What to expect: °· Any bruising will usually fade within 1 to 2 weeks. °· Blood that collects in the tissue (hematoma) may be painful to the touch. It should usually decrease in size and tenderness within 1 to 2 weeks. °SEEK IMMEDIATE MEDICAL CARE IF: °· You have unusual pain at the radial site. °· You have redness, warmth, swelling, or pain at the radial site. °· You have drainage (other than a small amount of blood on the dressing). °· You have chills. °· You have a fever or persistent symptoms for more than 72 hours. °· You have a fever and your symptoms suddenly get worse. °· Your arm becomes pale, cool, tingly, or numb. °· You have heavy bleeding from the site. Hold pressure on the site. °Document Released: 02/02/2010 Document Revised:  03/25/2011 Document Reviewed: 02/02/2010 °[employer]® Patient Information ©2015 [employer]. This information is not intended to replace advice given to you by your health care provider. Make sure you discuss any questions you have with your health care provider. ° °

## 2014-01-25 NOTE — Interval H&P Note (Signed)
History and Physical Interval Note:  01/25/2014 9:19 AM  Anna Salinas  has presented today for surgery, with the diagnosis of severe aortic stenosis  The various methods of treatment have been discussed with the patient and family. After consideration of risks, benefits and other options for treatment, the patient has consented to  Procedure(s): LEFT AND RIGHT HEART CATHETERIZATION WITH CORONARY ANGIOGRAM (N/A) as a surgical intervention .  The patient's history has been reviewed, patient examined, no change in status, stable for surgery.  I have reviewed the patient's chart and labs.  Questions were answered to the patient's satisfaction.   Cath Lab Visit (complete for each Cath Lab visit)  Clinical Evaluation Leading to the Procedure:   ACS: No.  Non-ACS:    Anginal Classification: CCS II  Anti-ischemic medical therapy: No Therapy  Non-Invasive Test Results: No non-invasive testing performed  Prior CABG: No previous CABG        [name] [facility] 01/25/2014 9:19 AM

## 2014-01-25 NOTE — H&P (View-Only) (Signed)
[name] Date of Birth: [birth year] Medical Record #[id number]  History of Present Illness: Anna Salinas is seen today for follow up of aortic stenosis. She is a 44 yo WF with history of HTN and obesity. She has a history of a murmur evaluated by Dr Domenic Polite in 2012. An Echo was obtained which showed calcification of the aortic valve which appeared trileaflet. Mean gradient was 15 mm Hg. Peak of 28 mm Hg. When she had a tubal ligation she was told that her murmur was worse. Repeat Echo showed a bicuspid valve with severe aortic stenosis. She does complain of SOB if she walks up steps or with more than usual exertion. This has been noticeable over the past 5 months. Occasional dyspnea and lightheadedness. Has regular dental follow up. She is now prepared to proceed with cardiac cath.    Medication List       This list is accurate as of: 01/21/14  9:04 AM.  Always use your most recent med list.               lisinopril-hydrochlorothiazide 10-12.5 MG per tablet  Commonly known as:  PRINZIDE,ZESTORETIC  Take 1 tablet by mouth daily.         Allergies  Allergen Reactions  . Glipizide Nausea And Vomiting and Other (See Comments)    GI upset    Past Medical History  Diagnosis Date  . Essential hypertension, benign   . Palpitations   . Prediabetes   . Aortic stenosis due to bicuspid aortic valve     Past Surgical History  Procedure Laterality Date  . Cesarean section  2009  . Tubal ligation      History   Social History  . Marital Status: Married    Spouse Name: N/A    Number of Children: 3  . Years of Education: N/A   Occupational History  . cashier    Social History Main Topics  . Smoking status: Never Smoker   . Smokeless tobacco: Never Used  . Alcohol Use: No  . Drug Use: No  . Sexual Activity: None   Other Topics Concern  . None   Social History Narrative    Family History  Problem Relation Age of Onset  . Diabetes Mother   . Brain cancer  Brother   . Cancer Maternal Grandmother     Review of Systems: As noted in HPI.  All other systems were reviewed and are negative.  Physical Exam: BP 102/70 mmHg  Pulse 74  Ht 5\' 7"  (1.702 m)  Wt 223 lb 1 oz (101.18 kg)  BMI 34.93 kg/m2 Filed Weights   01/21/14 0833  Weight: 223 lb 1 oz (101.18 kg)  GENERAL:  Well appearing, obese WF in NAD HEENT:  PERRL, EOMI, sclera are clear. Oropharynx is clear. NECK:  No jugular venous distention, carotid upstroke mildly reduced, no bruits, no thyromegaly or adenopathy LUNGS:  Clear to auscultation bilaterally CHEST:  Unremarkable HEART:  RRR,  PMI not displaced or sustained,S1 and S2 within normal limits, no S3, no S4: there is a harsh grade 2/6 harsh systolic murmur noted at the RUSB radiating to carotids. No diastolic murmur.  ABD:  Soft, nontender. BS +, no masses or bruits. No hepatomegaly, no splenomegaly EXT:  2 + pulses throughout, no edema, no cyanosis no clubbing SKIN:  Warm and dry.  No rashes NEURO:  Alert and oriented x 3. Cranial nerves II through XII intact. PSYCH:  Cognitively intact    LABORATORY DATA:  Echo:11/01/13:Study Conclusions  - Left ventricle: The cavity size was normal. Systolic function was vigorous. The estimated ejection fraction was in the range of 65% to 70%. Wall motion was normal; there were no regional wall motion abnormalities. Doppler parameters are consistent with abnormal left ventricular relaxation (grade 1 diastolic dysfunction). - Aortic valve: A bicuspid morphology cannot be excluded; moderately thickened, moderately calcified leaflets. Valve mobility was restricted. There was moderate to severe stenosis. Peak velocity (S): 412 cm/s. Mean gradient (S): 31 mm Hg. Valve area (Vmax): 0.95 cm^2.  Impressions:  - When compared to 2012 echocardiogram, aortic stenosis has advanced (previously peak velocity 2.11m/s).  Transthoracic echocardiography. M-mode, complete 2D, spectral Doppler, and  color Doppler. Birthdate: Patient birthdate: August 13, 1970. Age: Patient is 44 yr old. Sex: Gender: female. BMI: 33.9 kg/m^2. Blood pressure: 128/78 Patient status: Outpatient. Study date: Study date: 11/01/2013. Study time: 02:51 PM. Location: Echo laboratory.  -------------------------------------------------------------------  ------------------------------------------------------------------- Left ventricle: The cavity size was normal. Systolic function was vigorous. The estimated ejection fraction was in the range of 65% to 70%. Wall motion was normal; there were no regional wall motion abnormalities. The transmitral flow pattern was normal. The deceleration time of the early transmitral flow velocity was normal. The pulmonary vein flow pattern was normal. The tissue Doppler parameters were normal. Doppler parameters are consistent with abnormal left ventricular relaxation (grade 1 diastolic dysfunction).  ------------------------------------------------------------------- Aortic valve: A bicuspid morphology cannot be excluded; moderately thickened, moderately calcified leaflets. Valve mobility was restricted. Doppler: There was moderate to severe stenosis. There was no regurgitation. Valve area (VTI): 0.82 cm^2. Indexed valve area (VTI): 0.37 cm^2/m^2. Peak velocity ratio of LVOT to aortic valve: 0.3. Valve area (Vmax): 0.95 cm^2. Indexed valve area (Vmax): 0.43 cm^2/m^2. Mean gradient (S): 31 mm Hg. Peak gradient (S): 68 mm Hg.  ------------------------------------------------------------------- Aorta: The aorta was normal, not dilated, and non-diseased.  ------------------------------------------------------------------- Mitral valve: Structurally normal valve. Leaflet separation was normal. Doppler: Transvalvular velocity was within the normal range. There was no evidence for stenosis. There was no regurgitation. Peak gradient (D): 4 mm  Hg.  ------------------------------------------------------------------- Left atrium: LA volume/ BSA=15.9 ml/m2. The atrium was normal in size.  ------------------------------------------------------------------- Right ventricle: The cavity size was normal. Wall thickness was normal. Systolic function was normal.  ------------------------------------------------------------------- Pulmonic valve: Poorly visualized. Structurally normal valve. Cusp separation was normal. Doppler: Transvalvular velocity was within the normal range. There was trivial regurgitation.  ------------------------------------------------------------------- Tricuspid valve: Structurally normal valve. Leaflet separation was normal. Doppler: Transvalvular velocity was within the normal range. There was trivial regurgitation.  ------------------------------------------------------------------- Pulmonary artery: The main pulmonary artery was normal-sized.  ------------------------------------------------------------------- Right atrium: The atrium was normal in size.  ------------------------------------------------------------------- Pericardium: The pericardium was normal in appearance. There was no pericardial effusion.  ------------------------------------------------------------------- Systemic veins: Inferior vena cava: The vessel was normal in size. The respirophasic diameter changes were in the normal range (= 50%), consistent with normal central venous pressure. Diameter: 11 mm.  ------------------------------------------------------------------- Post procedure conclusions Ascending Aorta:  - The aorta was normal, not dilated, and non-diseased.  ------------------------------------------------------------------- Measurements  IVC Value Reference ID 11 mm ---------  Left ventricle Value Reference LV ID, ED, PLAX chordal (L) 34.4 mm 43 - 52 LV ID, ES, PLAX chordal 23.2 mm 23 - 38 LV fx  shortening, PLAX chordal 33 % >=29 LV PW thickness, ED 9.82 mm --------- IVS/LV PW ratio, ED 0.7 <=1.3 Stroke volume, 2D 77 ml --------- Stroke volume/bsa, 2D 34 ml/m^2 --------- LV e&', lateral 14.3 cm/s --------- LV E/e&', lateral 6.7 ---------  LV e&', medial 8.99 cm/s --------- LV E/e&', medial 10.66 --------- LV e&', average 11.65 cm/s --------- LV E/e&', average 8.23 ---------  Ventricular septum Value Reference IVS thickness, ED 6.86 mm ---------  LVOT Value Reference LVOT ID, S 20 mm --------- LVOT area 3.14 cm^2 --------- LVOT peak velocity, S 125 cm/s --------- LVOT peak gradient, S 6 mm Hg ---------  Aortic valve Value Reference Aortic valve peak velocity, S 412 cm/s --------- Aortic mean gradient, S 31 mm Hg --------- Aortic peak gradient, S 68 mm Hg --------- Aortic valve area, VTI 0.82 cm^2 --------- Aortic valve area/bsa, VTI 0.37 cm^2/m^2 --------- Velocity ratio, peak, LVOT/AV 0.3 --------- Aortic valve area, peak velocity 0.95 cm^2 --------- Aortic valve area/bsa, peak 0.43 cm^2/m^2 --------- velocity  Aorta Value Reference Aortic root ID, ED 30 mm ---------  Left atrium Value Reference LA ID, A-P, ES 33 mm --------- LA ID/bsa, A-P 1.47 cm/m^2 <=2.2 LA volume, ES, 1-p A4C 29 ml --------- LA volume/bsa, ES, 1-p A4C 12.9 ml/m^2 --------- LA volume, ES, 1-p A2C 33 ml --------- LA volume/bsa, ES, 1-p A2C 14.7 ml/m^2 ---------  Mitral valve Value Reference Mitral E-wave peak velocity 95.8 cm/s --------- Mitral A-wave peak velocity 70.1 cm/s --------- Mitral deceleration time 208 ms 150 - 230 Mitral peak gradient, D 4 mm Hg --------- Mitral E/A ratio, peak 1.4 ---------  Pulmonary arteries Value Reference PA pressure, S, DP 16 mm Hg <=30  Tricuspid valve Value Reference Tricuspid regurg peak velocity 182 cm/s --------- Tricuspid peak RV-RA gradient 13 mm Hg --------- Tricuspid maximal regurg 182 cm/s --------- velocity, PISA  Systemic veins Value  Reference Estimated CVP 3 mm Hg ---------  Right ventricle Value Reference RV pressure, S, DP 16 mm Hg <=30 RV s&', lateral, S 13.9 cm/s ---------  Legend: (L) and (H) mark values outside specified reference range.  ------------------------------------------------------------------- Prepared and Electronically Authenticated by  Candee Furbish, M.D. 2015-10-19T18:06:05  Ecg: 10/27/13 NSR with nonspecific ST abnormality.  Assessment / Plan: 1. Aortic stenosis. Now severe. Stenosis has progressed significantly over the past 3 years with doubling of her valve gradient and now velocity greater than 4 m/sec. She does have mild symptoms. I have personally reviewed her Echo images and I think this is a functionally bicuspid valve. Given the significant progression from 2012 I have informed the patient that she will need to consider AV replacement in the near future. We discussed that this is a progressive process and discussed the natural history. At her age she will need a mechanical valve. She is not able to have more children with prior tubal ligation. We discussed the need for coumadin long term. Her family was present and very supportive. Will proceed now with right and left heart cath. The procedure and risks were reviewed including but not limited to death, myocardial infarction, stroke, arrythmias, bleeding, transfusion, emergency surgery, dye allergy, or renal dysfunction. The patient voices understanding and is agreeable to proceed. Following cardiac cath will arrange evaluation with CT surgery.   2. HTN controlled.

## 2014-01-25 NOTE — CV Procedure (Signed)
    Cardiac Catheterization Procedure Note  Name: Anna Salinas. Sliwinski MRN: [id number] DOB: 07-12-1970  Procedure: Right Heart Cath, Left Heart Cath, Selective Coronary Angiography, LV angiography, Aortic root angiography.  Indication: 44 yo WF with moderate to severe aortic stenosis that has progressed since 2012 and is now symptomatic.    Procedural Details: The right wrist was prepped, draped, and anesthetized with 1% lidocaine. Using the modified Seldinger technique a 6 Fr slender sheath was placed in the right radial artery and a 5 French sheath was placed in the right brachial vein. A Swan-Ganz catheter was used for the right heart catheterization. Standard protocol was followed for recording of right heart pressures and sampling of oxygen saturations. Fick cardiac output was calculated. Standard Judkins catheters were used for selective coronary angiography and left ventriculography. It was very difficult to cross the AV. Initially unable to cross with a pigtail catheter, LA1, or MP. Finally crossed with the JR4 catheter and straight wire. There were no immediate procedural complications. The patient was transferred to the post catheterization recovery area for further monitoring.  Procedural Findings: Hemodynamics RA 8/7 mean 5 mm Hg RV 27/6 mm Hg PA 23/6 mean 14 mm Hg PCWP 9/8 mean 7 mm Hg LV 144/16 mm Hg AO 110/73 mean 90 mm Hg  AV gradient mean 24 mm Hg AV area 1.0 cm squared.  Oxygen saturations: PA 70% AO 98%  Cardiac Output (Fick) 5.1 L/min  Cardiac Index (Fick) 2.4 L/min/meter squared.    Coronary angiography: Coronary dominance: right  Left mainstem: Normal  Left anterior descending (LAD): Normal  Left circumflex (LCx): Normal  Right coronary artery (RCA): Normal  Left ventriculography: Left ventricular systolic function is normal, LVEF is estimated at 65%, there is no significant mitral regurgitation   The proximal aorta is dilated. There is no significant  aortic insufficiency. The AV is calcified with restricted motion.  Final Conclusions:   1. Normal coronary anatomy 2. Moderate to severe aortic stenosis 3. Normal LV function 4. Normal right heart pressures. 5. Dilated aortic root.   Recommendations: Consider AV replacement +/- aortic root grafting for progressive aortic stenosis.    Peter Martinique, Potter 01/25/2014, 10:25 AM

## 2014-01-25 NOTE — Progress Notes (Signed)
TR BAND REMOVAL  LOCATION:    right radial  DEFLATED PER PROTOCOL:    Yes.    TIME BAND OFF / DRESSING APPLIED:    1300   SITE UPON ARRIVAL:    Level 0  SITE AFTER BAND REMOVAL:    Level 0  CIRCULATION SENSATION AND MOVEMENT:    Within Normal Limits   Yes.    COMMENTS:   TRB REMOVED/ TEGADERM DSG APPLIED.

## 2014-01-26 ENCOUNTER — Other Ambulatory Visit: Payer: Self-pay | Admitting: *Deleted

## 2014-01-26 ENCOUNTER — Encounter: Payer: Self-pay | Admitting: Surgery

## 2014-01-26 ENCOUNTER — Institutional Professional Consult (permissible substitution) (INDEPENDENT_AMBULATORY_CARE_PROVIDER_SITE_OTHER): Payer: BLUE CROSS/BLUE SHIELD | Admitting: Surgery

## 2014-01-26 VITALS — BP 109/80 | HR 78 | Resp 20 | Ht 67.0 in | Wt 223.0 lb

## 2014-01-26 DIAGNOSIS — I350 Nonrheumatic aortic (valve) stenosis: Secondary | ICD-10-CM

## 2014-01-26 NOTE — Progress Notes (Signed)
Cardiothoracic Surgery Consultation   PCP is Chevis Pretty, FNP Referring Provider is Martinique, Peter M, MD  Chief Complaint  Patient presents with  . Aortic Stenosis    Surgical eval for possible AVR, cardiac cath 01/25/14    HPI:  The patient is a 44 year old woman with a history of hypertension and known aortic stenosis since she was evaluated for a heart murmur by Dr. Domenic Polite in 2012. The valve was felt to be tri-leaflet with a mean gradient of 15 mm Hg and a peak of 28 mm Hg. An echo on 11/01/2013 showed progression of her aortic stenosis with a mean gradient of 31 mm Hg, peak of 68 mm Hg, and a valve area of 0.95 cm2 by Vmax. The LV function was normal. She does report development of symptoms over the past  6 months with shortness of breath going up stairs or doing more than mild exertion. She has had some dizziness and fatigue as well as occasional chest pressure. Left and right heart pressures were normal. The AV mean gradient was 24 mm Hg with an AVA of 1.0 cm2.   A recent cath showed no coronary disease. The ascending aorta appeared mildly dilated.  Past Medical History  Diagnosis Date  . Essential hypertension, benign   . Palpitations   . Prediabetes   . Aortic stenosis due to bicuspid aortic valve     Past Surgical History  Procedure Laterality Date  . Cesarean section  2009  . Tubal ligation    . Left and right heart catheterization with coronary angiogram N/A 01/25/2014    Procedure: LEFT AND RIGHT HEART CATHETERIZATION WITH CORONARY ANGIOGRAM;  Surgeon: Peter M Martinique, MD;  Location: [facility] CATH LAB;  Service: Cardiovascular;  Laterality: N/A;    Family History  Problem Relation Age of Onset  . Diabetes Mother   . Brain cancer Brother   . Cancer Maternal Grandmother     Social History History  Substance Use Topics  . Smoking status: Never Smoker   . Smokeless tobacco: Never Used  . Alcohol Use: No    Current Outpatient Prescriptions  Medication Sig  Dispense Refill  . lisinopril-hydrochlorothiazide (PRINZIDE,ZESTORETIC) 10-12.5 MG per tablet Take 1 tablet by mouth daily.       No current facility-administered medications for this visit.    Allergies  Allergen Reactions  . Glipizide Nausea And Vomiting and Other (See Comments)    GI upset  . Shellfish Allergy Swelling    Review of Systems  Constitutional: Positive for activity change and fatigue. Negative for fever, chills, diaphoresis, appetite change and unexpected weight change.  HENT: Negative.   Eyes: Negative.   Respiratory: Positive for shortness of breath.   Cardiovascular: Positive for chest pain. Negative for palpitations and leg swelling.  Gastrointestinal: Negative.   Endocrine: Negative.   Genitourinary: Negative.   Musculoskeletal: Negative.   Skin: Negative.   Allergic/Immunologic: Negative.   Neurological: Positive for dizziness. Negative for syncope.  Hematological: Negative.   Psychiatric/Behavioral: Negative.     BP 109/80 mmHg  Pulse 78  Resp 20  Ht 5\' 7"  (1.702 m)  Wt 223 lb (101.152 kg)  BMI 34.92 kg/m2  SpO2 99%  LMP 01/18/2014 (Within Days) Physical Exam  Constitutional: She is oriented to person, place, and time. She appears well-developed and well-nourished. No distress.  Obese   HENT:  Head: Normocephalic and atraumatic.  Mouth/Throat: Oropharynx is clear and moist.  Teeth in fair condition. Sees her dentist every 6 months.  Eyes: EOM are normal. Pupils are equal, round, and reactive to light.  Neck: Normal range of motion. Neck supple. No JVD present. No thyromegaly present.  Cardiovascular: Normal rate, regular rhythm and intact distal pulses.   Murmur heard. 3/6 systolic murmur along RSB  Pulmonary/Chest: Effort normal and breath sounds normal. No respiratory distress. She has no wheezes. She has no rales.  Abdominal: Soft. Bowel sounds are normal. She exhibits no distension and no mass. There is no tenderness.  Musculoskeletal:  Normal range of motion. She exhibits no edema.  Lymphadenopathy:    She has no cervical adenopathy.  Neurological: She is alert and oriented to person, place, and time. She has normal strength. No cranial nerve deficit or sensory deficit.  Skin: Skin is warm and dry.  Psychiatric: She has a normal mood and affect.     Diagnostic Tests:   *[facility] [place], [place], [place] [address]              [phone]  ------------------------------------------------------------------- Transthoracic Echocardiography  Patient:  Anna Salinas, Anna Salinas MR #:    [id number] Study Date: 11/01/2013 Gender:   F Age:    56 Height:   172.7 cm Weight:   101.2 kg BSA:    2.24 m^2 Pt. Status: Room:  ATTENDING  Peter Martinique, M.D. ORDERING   Peter Martinique, M.D. REFERRING  Peter Martinique, M.D. SONOGRAPHER Marygrace Drought, RCS PERFORMING  [facility], Outpatient  cc:  ------------------------------------------------------------------- LV EF: 65% -  70%  ------------------------------------------------------------------- Indications:   Murmur 785.2.  ------------------------------------------------------------------- History:  PMH:  Dyspnea. Aortic valve disease. Risk factors: Hypertension.  ------------------------------------------------------------------- Study Conclusions  - Left ventricle: The cavity size was normal. Systolic function was vigorous. The estimated ejection fraction was in the range of 65% to 70%. Wall motion was normal; there were no regional wall motion abnormalities. Doppler parameters are consistent with abnormal left ventricular relaxation (grade 1 diastolic dysfunction). - Aortic valve: A bicuspid morphology cannot be excluded; moderately thickened, moderately calcified leaflets. Valve mobility was restricted. There was  moderate to severe stenosis. Peak velocity (S): 412 cm/s. Mean gradient (S): 31 mm Hg. Valve area (Vmax): 0.95 cm^2.  Impressions:  - When compared to 2012 echocardiogram, aortic stenosis has advanced (previously peak velocity 2.70m/s).  Transthoracic echocardiography. M-mode, complete 2D, spectral Doppler, and color Doppler. Birthdate: Patient birthdate: 1970/08/11. Age: Patient is 44 yr old. Sex: Gender: female. BMI: 33.9 kg/m^2. Blood pressure:   128/78 Patient status: Outpatient. Study date: Study date: 11/01/2013. Study time: 02:51 PM. Location: Echo laboratory.  -------------------------------------------------------------------  ------------------------------------------------------------------- Left ventricle: The cavity size was normal. Systolic function was vigorous. The estimated ejection fraction was in the range of 65% to 70%. Wall motion was normal; there were no regional wall motion abnormalities. The transmitral flow pattern was normal. The deceleration time of the early transmitral flow velocity was normal. The pulmonary vein flow pattern was normal. The tissue Doppler parameters were normal. Doppler parameters are consistent with abnormal left ventricular relaxation (grade 1 diastolic dysfunction).  ------------------------------------------------------------------- Aortic valve:  A bicuspid morphology cannot be excluded; moderately thickened, moderately calcified leaflets. Valve mobility was restricted. Doppler:  There was moderate to severe stenosis. There was no regurgitation.  Valve area (VTI): 0.82 cm^2. Indexed valve area (VTI): 0.37 cm^2/m^2. Peak velocity ratio of LVOT to aortic valve: 0.3. Valve area (Vmax): 0.95 cm^2. Indexed valve area (Vmax): 0.43 cm^2/m^2.  Mean gradient (S): 31 mm Hg. Peak gradient (S): 68 mm Hg.  ------------------------------------------------------------------- Aorta: The aorta was normal, not  dilated, and non-diseased.  ------------------------------------------------------------------- Mitral valve:  Structurally normal valve.  Leaflet separation was normal. Doppler: Transvalvular velocity was within the normal range. There was no evidence for stenosis. There was no regurgitation.  Peak gradient (D): 4 mm Hg.  ------------------------------------------------------------------- Left atrium: LA volume/ BSA=15.9 ml/m2. The atrium was normal in size.  ------------------------------------------------------------------- Right ventricle: The cavity size was normal. Wall thickness was normal. Systolic function was normal.  ------------------------------------------------------------------- Pulmonic valve:  Poorly visualized. Structurally normal valve. Cusp separation was normal. Doppler: Transvalvular velocity was within the normal range. There was trivial regurgitation.  ------------------------------------------------------------------- Tricuspid valve:  Structurally normal valve.  Leaflet separation was normal. Doppler: Transvalvular velocity was within the normal range. There was trivial regurgitation.  ------------------------------------------------------------------- Pulmonary artery:  The main pulmonary artery was normal-sized.  ------------------------------------------------------------------- Right atrium: The atrium was normal in size.  ------------------------------------------------------------------- Pericardium: The pericardium was normal in appearance. There was no pericardial effusion.  ------------------------------------------------------------------- Systemic veins: Inferior vena cava: The vessel was normal in size. The respirophasic diameter changes were in the normal range (= 50%), consistent with normal central venous pressure. Diameter: 11 mm.  ------------------------------------------------------------------- Post procedure  conclusions Ascending Aorta:  - The aorta was normal, not dilated, and non-diseased.  ------------------------------------------------------------------- Measurements  IVC                    Value     Reference ID                    11  mm    ---------  Left ventricle              Value     Reference LV ID, ED, PLAX chordal      (L)   34.4 mm    43 - 52 LV ID, ES, PLAX chordal          23.2 mm    23 - 38 LV fx shortening, PLAX chordal      33  %    >=29 LV PW thickness, ED            9.82 mm    --------- IVS/LV PW ratio, ED            0.7      <=1.3 Stroke volume, 2D             77  ml    --------- Stroke volume/bsa, 2D           34  ml/m^2  --------- LV e&', lateral              14.3 cm/s   --------- LV E/e&', lateral             6.7      --------- LV e&', medial               8.99 cm/s   --------- LV E/e&', medial              10.66     --------- LV e&', average              11.65 cm/s   --------- LV E/e&', average             8.23      ---------  Ventricular septum            Value  Reference IVS thickness, ED             6.86 mm    ---------  LVOT                   Value     Reference LVOT ID, S                20  mm    --------- LVOT area                 3.14 cm^2   --------- LVOT peak velocity, S           125  cm/s   --------- LVOT peak gradient, S           6   mm Hg  ---------  Aortic valve               Value     Reference Aortic valve peak velocity, S       412  cm/s   --------- Aortic mean gradient, S           31  mm Hg  --------- Aortic peak gradient, S          68  mm Hg  --------- Aortic valve area, VTI          0.82 cm^2   --------- Aortic valve area/bsa, VTI        0.37 cm^2/m^2 --------- Velocity ratio, peak, LVOT/AV       0.3      --------- Aortic valve area, peak velocity     0.95 cm^2   --------- Aortic valve area/bsa, peak        0.43 cm^2/m^2 --------- velocity  Aorta                   Value     Reference Aortic root ID, ED            30  mm    ---------  Left atrium                Value     Reference LA ID, A-P, ES              33  mm    --------- LA ID/bsa, A-P              1.47 cm/m^2  <=2.2 LA volume, ES, 1-p A4C          29  ml    --------- LA volume/bsa, ES, 1-p A4C        12.9 ml/m^2  --------- LA volume, ES, 1-p A2C          33  ml    --------- LA volume/bsa, ES, 1-p A2C        14.7 ml/m^2  ---------  Mitral valve               Value     Reference Mitral E-wave peak velocity        95.8 cm/s   --------- Mitral A-wave peak velocity        70.1 cm/s   --------- Mitral deceleration time         208  ms    150 - 230 Mitral peak gradient, D          4   mm Hg  --------- Mitral E/A ratio, peak          1.4      ---------  Pulmonary arteries            Value     Reference PA pressure, S, DP            16  mm Hg  <=30  Tricuspid valve              Value     Reference Tricuspid regurg peak velocity      182  cm/s   --------- Tricuspid peak RV-RA gradient       13  mm Hg  --------- Tricuspid maximal regurg         182  cm/s   --------- velocity, PISA  Systemic veins               Value     Reference Estimated CVP               3   mm Hg  ---------  Right ventricle              Value     Reference RV pressure, S, DP            16  mm Hg  <=30 RV s&', lateral, S             13.9 cm/s   ---------  Legend: (L) and (H) mark values outside specified reference range.  ------------------------------------------------------------------- Prepared and Electronically Authenticated by  Candee Furbish, M.D. 2015-10-19T18:06:05    Cardiac Catheterization Procedure Note  Name: Anna Salinas MRN: [id number] DOB: 30-Dec-1970  Procedure: Right Heart Cath, Left Heart Cath, Selective Coronary Angiography, LV angiography, Aortic root angiography.  Indication: 44 yo WF with moderate to severe aortic stenosis that has progressed since 2012 and is now symptomatic.   Procedural Details: The right wrist was prepped, draped, and anesthetized with 1% lidocaine. Using the modified Seldinger technique a 6 Fr slender sheath was placed in the right radial artery and a 5 French sheath was placed in the right brachial vein. A Swan-Ganz catheter was used for the right heart catheterization. Standard protocol was followed for recording of right heart pressures and sampling of oxygen saturations. Fick cardiac output was calculated. Standard Judkins catheters were used for selective coronary angiography and left ventriculography. It was very difficult to cross the AV. Initially unable to cross with a pigtail catheter, LA1, or MP. Finally crossed with the JR4 catheter and straight wire. There were no immediate procedural complications. The patient was transferred to the post catheterization recovery area for further monitoring.  Procedural Findings: Hemodynamics RA 8/7 mean 5 mm Hg RV 27/6 mm Hg PA 23/6 mean 14 mm Hg PCWP 9/8 mean 7 mm Hg LV 144/16 mm Hg AO 110/73 mean 90 mm Hg  AV  gradient mean 24 mm Hg AV area 1.0 cm squared.  Oxygen saturations: PA 70% AO 98%  Cardiac Output (Fick) 5.1 L/min  Cardiac Index (Fick) 2.4 L/min/meter squared.   Coronary angiography: Coronary dominance: right  Left mainstem: Normal  Left anterior descending (LAD): Normal  Left circumflex (LCx): Normal  Right coronary artery (RCA): Normal  Left ventriculography: Left ventricular systolic function is normal, LVEF is estimated at 65%, there is no significant mitral regurgitation   The proximal aorta is dilated. There is no significant aortic insufficiency. The AV is calcified with restricted motion.  Final Conclusions:  1. Normal coronary anatomy 2. Moderate to severe aortic stenosis 3. Normal LV function 4. Normal right heart pressures. 5. Dilated aortic root.   Recommendations: Consider AV replacement +/-  aortic root grafting for progressive aortic stenosis.    Peter Martinique, [place] 01/25/2014, 10:25 AM    Impression:  I have personally reviewed her echo and cath films. Her aortic valve is very calcified with marked restriction to leaflet opening and looks like severe AS. Her transvalvular gradient by echo is almost in the severe range. She is symptomatic and I agree with Dr. Martinique that it is time to replace her valve. She does have some enlargement of the ascending aorta and I will check a CTA to assess the thoracic aorta. It is not clear if she has a bicuspid or a tricuspid valve but I think there are three commissures. I reviewed these studies with her, her two sisters, and mother and answered their questions. I discussed the operative procedure with the patient and family including alternatives, benefits and risks; including but not limited to bleeding, blood transfusion, infection, stroke, myocardial infarction, heart block requiring a permanent pacemaker, organ dysfunction, and death.  Anna Salinas understands and agrees to proceed.  I discussed the  pros and cons of mechanical and tissue valves and my strong recommendation for a mechanical valve given her age. She has already had children and a tubal ligation. She feels that compliance with coumadin would not be a problem and is in agreement with a mechanical valve.   Plan:  I will see her back after the CTA to discuss the results and make operative plans.

## 2014-02-02 ENCOUNTER — Ambulatory Visit
Admission: RE | Admit: 2014-02-02 | Discharge: 2014-02-02 | Disposition: A | Discharge status: Home / Self Care | Payer: BLUE CROSS/BLUE SHIELD | Source: Ambulatory Visit | Attending: Surgery | Admitting: Surgery

## 2014-02-02 ENCOUNTER — Other Ambulatory Visit: Payer: Self-pay | Admitting: *Deleted

## 2014-02-02 ENCOUNTER — Ambulatory Visit (INDEPENDENT_AMBULATORY_CARE_PROVIDER_SITE_OTHER): Payer: BLUE CROSS/BLUE SHIELD | Admitting: Surgery

## 2014-02-02 VITALS — BP 107/63 | HR 86 | Resp 20 | Ht 67.0 in | Wt 223.0 lb

## 2014-02-02 DIAGNOSIS — I350 Nonrheumatic aortic (valve) stenosis: Secondary | ICD-10-CM

## 2014-02-02 MED ORDER — IOHEXOL 350 MG/ML SOLN
75.0000 mL | Freq: Once | INTRAVENOUS | Status: AC | PRN
  Administered 2014-02-02: 75 mL via INTRAVENOUS

## 2014-02-03 ENCOUNTER — Encounter: Payer: Self-pay | Admitting: Surgery

## 2014-02-03 NOTE — Progress Notes (Signed)
HPI:  She returns today to review the results of the chest CTA and to schedule surgery for severe aortic stenosis. I have personally reviewed the CTA and the sinus portion appears normal. Above the sinotubular junction there is mild fusiform dilatation with a diameter of 3.5 cm at the level of the right pulmonary artery. The proximal aortic arch is 3.3 cm and the distal arch is 2.1 cm. The descending aorta is 2.25 cm.  She did see her dentist and is scheduled to have a broken tooth extracted tomorrow. She said that he did not feel that there was any associated infection.  Current Outpatient Prescriptions  Medication Sig Dispense Refill  . lisinopril-hydrochlorothiazide (PRINZIDE,ZESTORETIC) 10-12.5 MG per tablet Take 1 tablet by mouth daily.       No current facility-administered medications for this visit.     Physical Exam: BP 107/63 mmHg  Pulse 86  Resp 20  Ht 5\' 7"  (1.702 m)  Wt 223 lb (101.152 kg)  BMI 34.92 kg/m2  SpO2 97%  LMP 01/18/2014 (Within Days) She looks well Cardiac exam shows a regular rate and rhythm with a 3/6 harsh systolic murmur over the aorta. Lungs are clear There is no peripheral edema  Diagnostic Tests:  CLINICAL DATA: Aortic valvular stenosis with dyspnea on exertion and dizziness.  EXAM: CT ANGIOGRAPHY CHEST WITH CONTRAST  TECHNIQUE: Multidetector CT imaging of the chest was performed using the standard protocol during bolus administration of intravenous contrast. Multiplanar CT image reconstructions and MIPs were obtained to evaluate the vascular anatomy.  CONTRAST: 51mL OMNIPAQUE IOHEXOL 350 MG/ML SOLN  COMPARISON: Chest x-ray on 01/21/2014  FINDINGS: The aortic valve is calcified. The sinuses of Valsalva appear normal with no evidence of dilatation. The ascending thoracic aorta is mildly prominent in caliber, measuring 3.5- 3.7 cm in greatest diameter compared to caliber of the descending thoracic aorta of 2.0 cm. The  proximal arch measures 3.3 cm. The distal arch measures 2.1 cm.  Proximal great vessels show normal branching anatomy and are widely patent. No evidence of aortic dissection.  The heart size is normal. No calcified coronary artery plaque is identified. No pleural or pericardial fluid is seen.  There is no evidence of pulmonary edema, consolidation, pneumothorax, pulmonary nodule or lymphadenopathy. No airway obstruction identified. The thyroid gland appears normal. Visualized upper abdomen shows steatosis of the liver and probable small sliding hiatal hernia. Minimal spondylosis noted of the thoracic spine.  Review of the MIP images confirms the above findings.  IMPRESSION: 1. Calcified aortic valve. The ascending thoracic aorta is mildly prominent in caliber, measuring 3.5- 3.7 cm in greatest diameter. This is likely representative of mild aneurysmal disease given relative dilatation compared to the diameter of the descending thoracic aorta of 2 cm. 2. Steatosis of the liver.   Electronically Signed  By: Aletta Edouard M.D.  On: 02/02/2014 09:03  Impression:  She has severe, symptomatic aortic stenosis. It is not clear if she has a bicuspid valve. She has mild dilatation of the ascending aorta but at 3.5 cm it is still significantly below the threshold for recommending replacement with AVR even if it is a bicuspid valve. I reviewed the scan with her and her family and my recommendation for leaving the aorta alone. I discussed the small risk that it may progressively enlarge and require intervention in the future. I discussed the operative procedure again with the patient and family including alternatives, benefits and risks; including but not limited to bleeding, blood transfusion,  infection, stroke, myocardial infarction, heart block requiring a permanent pacemaker, organ dysfunction, and death. Jabria D. Charles understands and agrees to proceed. I discussed the pros  and cons of mechanical and tissue valves and my strong recommendation for a mechanical valve given her age. She has already had children and a tubal ligation. She feels that compliance with coumadin would not be a problem and is in agreement with a mechanical valve. She is having a tooth extracted tomorrow so we will give her a week to recover before AVR.  Plan:  AVR with a mechanical valve on Monday 02/14/2014.

## 2014-02-09 NOTE — Pre-Procedure Instructions (Signed)
Anna Salinas  02/09/2014   Your procedure is scheduled on:  Monday February 14, 2014 at 7:30 AM.  Report to [facility] Admitting at 5:30 AM.  Call this number if you have problems the morning of surgery: [phone]   For any other questions Monday-Friday from 8am-4pm call: [phone]   Remember:   Do not eat food or drink liquids after midnight.   Take these medicines the morning of surgery with A SIP OF WATER: NONE   Do not wear jewelry, make-up or nail polish.  Do not wear lotions, powders, or perfumes.   Do not shave 48 hours prior to surgery.   Do not bring valuables to the hospital.  [facility] is not responsible for any belongings or valuables.               Contacts, dentures or bridgework may not be worn into surgery.  Leave suitcase in the car. After surgery it may be brought to your room.  For patients admitted to the hospital, discharge time is determined by your treatment team.               Patients discharged the day of surgery will not be allowed to drive home.  Name and phone number of your driver:   Special Instructions: Shower using CHG soap the night before and the morning of your surgery   Please read over the following fact sheets that you were given: Pain Booklet, Coughing and Deep Breathing, Blood Transfusion Information, MRSA Information and Surgical Site Infection Prevention

## 2014-02-10 ENCOUNTER — Ambulatory Visit (HOSPITAL_COMMUNITY)
Admission: RE | Admit: 2014-02-10 | Discharge: 2014-02-10 | Disposition: A | Discharge status: Home / Self Care | Payer: BLUE CROSS/BLUE SHIELD | Source: Ambulatory Visit | Attending: Surgery | Admitting: Surgery

## 2014-02-10 ENCOUNTER — Encounter (HOSPITAL_COMMUNITY)
Admission: RE | Admit: 2014-02-10 | Discharge: 2014-02-10 | Disposition: A | Discharge status: Home / Self Care | Payer: BLUE CROSS/BLUE SHIELD | Source: Ambulatory Visit | Attending: Surgery | Admitting: Surgery

## 2014-02-10 ENCOUNTER — Encounter (HOSPITAL_COMMUNITY): Payer: Self-pay

## 2014-02-10 VITALS — BP 116/79 | HR 82 | Temp 98.1°F | Resp 20 | Ht 67.0 in | Wt 221.2 lb

## 2014-02-10 DIAGNOSIS — I350 Nonrheumatic aortic (valve) stenosis: Secondary | ICD-10-CM

## 2014-02-10 DIAGNOSIS — Z01818 Encounter for other preprocedural examination: Secondary | ICD-10-CM | POA: Diagnosis not present

## 2014-02-10 LAB — PULMONARY FUNCTION TEST
DL/VA % pred: 126 %
DL/VA: 6.63 ml/min/mmHg/L
DLCO cor % pred: 106 %
DLCO cor: 31.55 ml/min/mmHg
DLCO unc % pred: 110 %
DLCO unc: 32.66 ml/min/mmHg
FEF 25-75 Post: 3.58 L/sec
FEF 25-75 Pre: 3.03 L/sec
FEF2575-%Change-Post: 18 %
FEF2575-%Pred-Post: 108 %
FEF2575-%Pred-Pre: 91 %
FEV1-%Change-Post: 5 %
FEV1-%Pred-Post: 89 %
FEV1-%Pred-Pre: 84 %
FEV1-Post: 3.01 L
FEV1-Pre: 2.86 L
FEV1FVC-%Change-Post: -1 %
FEV1FVC-%Pred-Pre: 100 %
FEV6-%Change-Post: 4 %
FEV6-%Pred-Post: 87 %
FEV6-%Pred-Pre: 83 %
FEV6-Post: 3.59 L
FEV6-Pre: 3.42 L
FEV6FVC-%Change-Post: 0 %
FEV6FVC-%Pred-Post: 100 %
FEV6FVC-%Pred-Pre: 100 %
FVC-%Change-Post: 6 %
FVC-%Pred-Post: 88 %
FVC-%Pred-Pre: 82 %
FVC-Post: 3.69 L
FVC-Pre: 3.46 L
Post FEV1/FVC ratio: 82 %
Post FEV6/FVC ratio: 99 %
Pre FEV1/FVC ratio: 83 %
Pre FEV6/FVC Ratio: 99 %
RV % pred: 122 %
RV: 2.23 L
TLC % pred: 98 %
TLC: 5.55 L

## 2014-02-10 LAB — CBC
HCT: 41.2 % (ref 36.0–46.0)
HEMOGLOBIN: 14.2 g/dL (ref 12.0–15.0)
MCH: 29.3 pg (ref 26.0–34.0)
MCHC: 34.5 g/dL (ref 30.0–36.0)
MCV: 85.1 fL (ref 78.0–100.0)
Platelets: 154 10*3/uL (ref 150–400)
RBC: 4.84 MIL/uL (ref 3.87–5.11)
RDW: 12.9 % (ref 11.5–15.5)
WBC: 6.8 10*3/uL (ref 4.0–10.5)

## 2014-02-10 LAB — APTT: APTT: 32 s (ref 24–37)

## 2014-02-10 LAB — URINALYSIS, ROUTINE W REFLEX MICROSCOPIC
Bilirubin Urine: NEGATIVE
HGB URINE DIPSTICK: NEGATIVE
KETONES UR: NEGATIVE mg/dL
LEUKOCYTES UA: NEGATIVE
Nitrite: NEGATIVE
PH: 5 (ref 5.0–8.0)
Protein, ur: NEGATIVE mg/dL
Specific Gravity, Urine: 1.026 (ref 1.005–1.030)
Urobilinogen, UA: 0.2 mg/dL (ref 0.0–1.0)

## 2014-02-10 LAB — BLOOD GAS, ARTERIAL
Acid-base deficit: 0 mmol/L (ref 0.0–2.0)
BICARBONATE: 23.5 meq/L (ref 20.0–24.0)
Drawn by: 206361
FIO2: 0.21 %
O2 SAT: 98.4 %
Patient temperature: 98.6
TCO2: 24.5 mmol/L (ref 0–100)
pCO2 arterial: 34.2 mmHg — ABNORMAL LOW (ref 35.0–45.0)
pH, Arterial: 7.452 — ABNORMAL HIGH (ref 7.350–7.450)
pO2, Arterial: 102 mmHg — ABNORMAL HIGH (ref 80.0–100.0)

## 2014-02-10 LAB — GLUCOSE, CAPILLARY: Glucose-Capillary: 258 mg/dL — ABNORMAL HIGH (ref 70–99)

## 2014-02-10 LAB — COMPREHENSIVE METABOLIC PANEL
ALBUMIN: 3.8 g/dL (ref 3.5–5.2)
ALT: 155 U/L — AB (ref 0–35)
AST: 173 U/L — AB (ref 0–37)
Alkaline Phosphatase: 72 U/L (ref 39–117)
Anion gap: 11 (ref 5–15)
BUN: 10 mg/dL (ref 6–23)
CO2: 22 mmol/L (ref 19–32)
Calcium: 8.9 mg/dL (ref 8.4–10.5)
Chloride: 101 mmol/L (ref 96–112)
Creatinine, Ser: 0.62 mg/dL (ref 0.50–1.10)
GFR calc Af Amer: >90 mL/min (ref >90)
GFR calc non Af Amer: >90 mL/min (ref >90)
Glucose, Bld: 250 mg/dL — ABNORMAL HIGH (ref 70–99)
Potassium: 3.3 mmol/L — ABNORMAL LOW (ref 3.5–5.1)
Sodium: 134 mmol/L — ABNORMAL LOW (ref 135–145)
TOTAL PROTEIN: 7.3 g/dL (ref 6.0–8.3)
Total Bilirubin: 0.6 mg/dL (ref 0.3–1.2)

## 2014-02-10 LAB — HCG, SERUM, QUALITATIVE: PREG SERUM: NEGATIVE

## 2014-02-10 LAB — PROTIME-INR
INR: 1.07 (ref 0.00–1.49)
PROTHROMBIN TIME: 14 s (ref 11.6–15.2)

## 2014-02-10 LAB — URINE MICROSCOPIC-ADD ON

## 2014-02-10 LAB — SURGICAL PCR SCREEN
MRSA, PCR: NEGATIVE
Staphylococcus aureus: POSITIVE — AB

## 2014-02-10 LAB — ABO/RH: ABO/RH(D): A POS

## 2014-02-10 MED ORDER — ALBUTEROL SULFATE (2.5 MG/3ML) 0.083% IN NEBU
2.5000 mg | INHALATION_SOLUTION | Freq: Once | RESPIRATORY_TRACT | Status: AC
Start: 2014-02-10 — End: 2014-02-10
  Administered 2014-02-10: 2.5 mg via RESPIRATORY_TRACT

## 2014-02-10 NOTE — Progress Notes (Signed)
Nurse called pharmacy and left a voicemail as Pharmacist was busy with another patient. Nurse then called patient and informed her of positive PCR results and instructed her to call [facility] pharmacy to ensure prescription was ready for pickup before picking it up. Patient verbalized understanding.

## 2014-02-10 NOTE — Progress Notes (Addendum)
VASCULAR LAB PRELIMINARY  PRELIMINARY  PRELIMINARY  PRELIMINARY  Pre-op Cardiac Surgery  Carotid Findings:  Bilateral:  1-39% ICA stenosis.  Vertebral artery flow is antegrade.      Upper Extremity Right Left  Brachial Pressures  triphasic 109 triphasic  Radial Waveforms triphasic triphasic  Ulnar Waveforms triphasic triphasic  Palmar Arch (Allen's Test) WNL WNL   Findings:  Doppler waveforms remain normal with ulnar and radial compressions bilaterally.   Anna Salinas, RVT 02/10/2014, 2:38 PM

## 2014-02-11 LAB — HEMOGLOBIN A1C
HEMOGLOBIN A1C: 10.3 % — AB (ref 4.8–5.6)
Mean Plasma Glucose: 249 mg/dL

## 2014-02-11 NOTE — Progress Notes (Signed)
Anesthesia Chart Review:  Patient is a 44 year old female scheduled for AVR on 02/14/14 by Dr. Cyndia Bent.  History includes severe AS, non-smoker, HTN, palpitations, DM2 (currently no meds). Fatty liver and 3.7 ascending thoracic aortic aneurysm by 02/02/14 CTA. BMI is consistent with obesity. PCP Ronnald Collum, FNP (scheduled to get established 02/28/14).  Cardiologist Dr. Martinique.  Meds listed are lisinopril-HCTZ and PCN.  01/25/14 EKG: NSR.  01/25/14 cardiac cath: Final Conclusions:  1. Normal coronary anatomy 2. Moderate to severe aortic stenosis 3. Normal LV function 4. Normal right heart pressures. 5. Dilated aortic root.  Recommendations: Consider AV replacement +/- aortic root grafting for progressive aortic stenosis.   11/01/13 echo: - Left ventricle: The cavity size was normal. Systolic function was vigorous. The estimated ejection fraction was in the range of 65% to 70%. Wall motion was normal; there were no regional wall motion abnormalities. Doppler parameters are consistent with abnormal left ventricular relaxation (grade 1 diastolic dysfunction). - Aortic valve: A bicuspid morphology cannot be excluded; moderately thickened, moderately calcified leaflets. Valve mobility was restricted. There was moderate to severe stenosis. Peak velocity (S): 412 cm/s. Mean gradient (S): 31 mm Hg. Valve area (Vmax): 0.95 cm^2. Impressions: When compared to 2012 echocardiogram, aortic stenosis hasadvanced (previously peak velocity 2.41m/s).  02/02/14 CTA chest: IMPRESSION: 1. Calcified aortic valve. The ascending thoracic aorta is mildly prominent in caliber, measuring 3.5- 3.7 cm in greatest diameter. This is likely representative of mild aneurysmal disease given relative dilatation compared to the diameter of the descending thoracic aorta of 2 cm. 2. Steatosis of the liver.  Normal CXR 02/10/14.   02/10/14 PFTs: FVC 3.46 (82%), FEV1 2.86 (84%), DLCOunc 32.66  (110)  Preoperative labs noted.  AST/ALT elevated at 173/155. Glucose 250 with A1C of 10.3.  Results called to Pollyann Glen. She reviewed with Dr. Cyndia Bent.  He feels case should proceed as planned.  He is planning to hold her lisinopril and arrange further evaluation of her DM, elevated LFTs post-operatively.  I discussed with anesthesiologist Dr. Conrad Lewisburg.  Plan to repeat STAT HFP on the day of surgery.  She will also get a fasting CBG.  If labs are felt stable/acceptable by Dr. Cyndia Bent and her anesthesiologist then it is anticipated that she can proceed as planned.  George Hugh [facility] Short Stay Center/Anesthesiology Phone [phone] 02/11/2014 3:14 PM

## 2014-02-13 MED ORDER — POTASSIUM CHLORIDE 2 MEQ/ML IV SOLN
80.0000 meq | INTRAVENOUS | Status: DC
  Filled 2014-02-13: qty 40

## 2014-02-13 MED ORDER — DEXTROSE 5 % IV SOLN
1.5000 g | INTRAVENOUS | Status: AC
  Administered 2014-02-14: 1.5 g via INTRAVENOUS
  Administered 2014-02-14: .75 g via INTRAVENOUS
  Filled 2014-02-13: qty 1.5

## 2014-02-13 MED ORDER — PLASMA-LYTE 148 IV SOLN
INTRAVENOUS | Status: DC
  Filled 2014-02-13: qty 2.5

## 2014-02-13 MED ORDER — NITROGLYCERIN IN D5W 200-5 MCG/ML-% IV SOLN
2.0000 ug/min | INTRAVENOUS | Status: AC
  Administered 2014-02-14: 5 ug/min via INTRAVENOUS
  Filled 2014-02-13: qty 250

## 2014-02-13 MED ORDER — DEXMEDETOMIDINE HCL IN NACL 400 MCG/100ML IV SOLN
0.1000 ug/kg/h | INTRAVENOUS | Status: AC
  Administered 2014-02-14: 0.7 ug/kg/h via INTRAVENOUS
  Filled 2014-02-13: qty 100

## 2014-02-13 MED ORDER — CEFUROXIME SODIUM 750 MG IJ SOLR
750.0000 mg | INTRAMUSCULAR | Status: DC
  Filled 2014-02-13: qty 750

## 2014-02-13 MED ORDER — PHENYLEPHRINE HCL 10 MG/ML IJ SOLN
30.0000 ug/min | INTRAVENOUS | Status: DC
  Filled 2014-02-13: qty 2

## 2014-02-13 MED ORDER — SODIUM CHLORIDE 0.9 % IV SOLN
INTRAVENOUS | Status: AC
  Administered 2014-02-14: 69 mL/h via INTRAVENOUS
  Filled 2014-02-13: qty 40

## 2014-02-13 MED ORDER — MAGNESIUM SULFATE 50 % IJ SOLN
40.0000 meq | INTRAMUSCULAR | Status: DC
  Filled 2014-02-13: qty 10

## 2014-02-13 MED ORDER — EPINEPHRINE HCL 1 MG/ML IJ SOLN
0.0000 ug/min | INTRAVENOUS | Status: DC
  Filled 2014-02-13: qty 4

## 2014-02-13 MED ORDER — VANCOMYCIN HCL 10 G IV SOLR
1500.0000 mg | INTRAVENOUS | Status: AC
  Administered 2014-02-14: 1500 mg via INTRAVENOUS
  Filled 2014-02-13: qty 1500

## 2014-02-13 MED ORDER — DOPAMINE-DEXTROSE 3.2-5 MG/ML-% IV SOLN
0.0000 ug/kg/min | INTRAVENOUS | Status: DC
  Filled 2014-02-13: qty 250

## 2014-02-13 MED ORDER — SODIUM CHLORIDE 0.9 % IV SOLN
INTRAVENOUS | Status: AC
  Administered 2014-02-14: 6.5 [IU]/h via INTRAVENOUS
  Filled 2014-02-13: qty 2.5

## 2014-02-13 MED ORDER — SODIUM CHLORIDE 0.9 % IV SOLN
INTRAVENOUS | Status: DC
  Filled 2014-02-13: qty 30

## 2014-02-14 ENCOUNTER — Inpatient Hospital Stay (HOSPITAL_COMMUNITY): Payer: BLUE CROSS/BLUE SHIELD

## 2014-02-14 ENCOUNTER — Ambulatory Visit: Payer: Self-pay | Admitting: Cardiology

## 2014-02-14 ENCOUNTER — Inpatient Hospital Stay (HOSPITAL_COMMUNITY): Payer: BLUE CROSS/BLUE SHIELD | Admitting: Vascular Surgery

## 2014-02-14 ENCOUNTER — Encounter (HOSPITAL_COMMUNITY): Payer: Self-pay | Admitting: *Deleted

## 2014-02-14 ENCOUNTER — Inpatient Hospital Stay (HOSPITAL_COMMUNITY): Payer: BLUE CROSS/BLUE SHIELD | Admitting: Anesthesiology

## 2014-02-14 ENCOUNTER — Inpatient Hospital Stay (HOSPITAL_COMMUNITY)
Admission: RE | Admit: 2014-02-14 | Discharge: 2014-02-18 | DRG: 220 | Disposition: A | Discharge status: Home / Self Care | Payer: BLUE CROSS/BLUE SHIELD | Source: Ambulatory Visit | Attending: Surgery | Admitting: Surgery

## 2014-02-14 ENCOUNTER — Encounter (HOSPITAL_COMMUNITY)
Admission: RE | Disposition: A | Discharge status: Home / Self Care | Payer: BLUE CROSS/BLUE SHIELD | Source: Ambulatory Visit | Attending: Surgery

## 2014-02-14 DIAGNOSIS — I350 Nonrheumatic aortic (valve) stenosis: Principal | ICD-10-CM | POA: Diagnosis present

## 2014-02-14 DIAGNOSIS — Z91013 Allergy to seafood: Secondary | ICD-10-CM

## 2014-02-14 DIAGNOSIS — E6609 Other obesity due to excess calories: Secondary | ICD-10-CM | POA: Diagnosis present

## 2014-02-14 DIAGNOSIS — E8779 Other fluid overload: Secondary | ICD-10-CM | POA: Diagnosis not present

## 2014-02-14 DIAGNOSIS — Z6834 Body mass index (BMI) 34.0-34.9, adult: Secondary | ICD-10-CM | POA: Diagnosis not present

## 2014-02-14 DIAGNOSIS — Z79899 Other long term (current) drug therapy: Secondary | ICD-10-CM

## 2014-02-14 DIAGNOSIS — D696 Thrombocytopenia, unspecified: Secondary | ICD-10-CM | POA: Diagnosis not present

## 2014-02-14 DIAGNOSIS — Q231 Congenital insufficiency of aortic valve: Secondary | ICD-10-CM | POA: Diagnosis not present

## 2014-02-14 DIAGNOSIS — Z833 Family history of diabetes mellitus: Secondary | ICD-10-CM | POA: Diagnosis not present

## 2014-02-14 DIAGNOSIS — Z952 Presence of prosthetic heart valve: Secondary | ICD-10-CM

## 2014-02-14 DIAGNOSIS — E119 Type 2 diabetes mellitus without complications: Secondary | ICD-10-CM | POA: Diagnosis present

## 2014-02-14 DIAGNOSIS — I10 Essential (primary) hypertension: Secondary | ICD-10-CM | POA: Diagnosis present

## 2014-02-14 DIAGNOSIS — Z7901 Long term (current) use of anticoagulants: Secondary | ICD-10-CM | POA: Diagnosis not present

## 2014-02-14 DIAGNOSIS — E876 Hypokalemia: Secondary | ICD-10-CM | POA: Diagnosis not present

## 2014-02-14 DIAGNOSIS — D62 Acute posthemorrhagic anemia: Secondary | ICD-10-CM | POA: Diagnosis not present

## 2014-02-14 HISTORY — PX: INTRAOPERATIVE TRANSESOPHAGEAL ECHOCARDIOGRAM: SHX5062

## 2014-02-14 HISTORY — PX: AORTIC VALVE REPLACEMENT: SHX41

## 2014-02-14 LAB — POCT I-STAT 3, ART BLOOD GAS (G3+)
ACID-BASE EXCESS: 1 mmol/L (ref 0.0–2.0)
Acid-Base Excess: 1 mmol/L (ref 0.0–2.0)
Acid-base deficit: 2 mmol/L (ref 0.0–2.0)
Acid-base deficit: 4 mmol/L — ABNORMAL HIGH (ref 0.0–2.0)
Acid-base deficit: 3 mmol/L — ABNORMAL HIGH (ref 0.0–2.0)
BICARBONATE: 25.6 meq/L — AB (ref 20.0–24.0)
BICARBONATE: 23.7 meq/L (ref 20.0–24.0)
BICARBONATE: 22.9 meq/L (ref 20.0–24.0)
Bicarbonate: 27.3 mEq/L — ABNORMAL HIGH (ref 20.0–24.0)
Bicarbonate: 25.3 mEq/L — ABNORMAL HIGH (ref 20.0–24.0)
Bicarbonate: 22 mEq/L (ref 20.0–24.0)
O2 SAT: 100 %
O2 Saturation: 100 %
O2 Saturation: 100 %
O2 Saturation: 100 %
O2 Saturation: 100 %
O2 Saturation: 99 %
PCO2 ART: 51.7 mmHg — AB (ref 35.0–45.0)
PCO2 ART: 43.5 mmHg (ref 35.0–45.0)
PCO2 ART: 41.5 mmHg (ref 35.0–45.0)
PO2 ART: 437 mmHg — AB (ref 80.0–100.0)
PO2 ART: 381 mmHg — AB (ref 80.0–100.0)
PO2 ART: 307 mmHg — AB (ref 80.0–100.0)
PO2 ART: 186 mmHg — AB (ref 80.0–100.0)
Patient temperature: 36.9
Patient temperature: 37
TCO2: 29 mmol/L (ref 0–100)
TCO2: 26 mmol/L (ref 0–100)
TCO2: 27 mmol/L (ref 0–100)
TCO2: 25 mmol/L (ref 0–100)
TCO2: 23 mmol/L (ref 0–100)
TCO2: 24 mmol/L (ref 0–100)
pCO2 arterial: 39.7 mmHg (ref 35.0–45.0)
pCO2 arterial: 45.5 mmHg — ABNORMAL HIGH (ref 35.0–45.0)
pCO2 arterial: 40.3 mmHg (ref 35.0–45.0)
pH, Arterial: 7.332 — ABNORMAL LOW (ref 7.350–7.450)
pH, Arterial: 7.412 (ref 7.350–7.450)
pH, Arterial: 7.358 (ref 7.350–7.450)
pH, Arterial: 7.369 (ref 7.350–7.450)
pH, Arterial: 7.311 — ABNORMAL LOW (ref 7.350–7.450)
pH, Arterial: 7.35 (ref 7.350–7.450)
pO2, Arterial: 190 mmHg — ABNORMAL HIGH (ref 80.0–100.0)
pO2, Arterial: 170 mmHg — ABNORMAL HIGH (ref 80.0–100.0)

## 2014-02-14 LAB — POCT I-STAT, CHEM 8
BUN: 11 mg/dL (ref 6–23)
BUN: 12 mg/dL (ref 6–23)
BUN: 9 mg/dL (ref 6–23)
BUN: 11 mg/dL (ref 6–23)
BUN: 9 mg/dL (ref 6–23)
BUN: 9 mg/dL (ref 6–23)
BUN: 7 mg/dL (ref 6–23)
CALCIUM ION: 1.2 mmol/L (ref 1.12–1.23)
CALCIUM ION: 0.86 mmol/L — AB (ref 1.12–1.23)
CALCIUM ION: 0.93 mmol/L — AB (ref 1.12–1.23)
CHLORIDE: 101 mmol/L (ref 96–112)
CHLORIDE: 102 mmol/L (ref 96–112)
CHLORIDE: 97 mmol/L (ref 96–112)
CHLORIDE: 102 mmol/L (ref 96–112)
CHLORIDE: 99 mmol/L (ref 96–112)
CHLORIDE: 107 mmol/L (ref 96–112)
Calcium, Ion: 1.16 mmol/L (ref 1.12–1.23)
Calcium, Ion: 1.02 mmol/L — ABNORMAL LOW (ref 1.12–1.23)
Calcium, Ion: 0.95 mmol/L — ABNORMAL LOW (ref 1.12–1.23)
Calcium, Ion: 1.11 mmol/L — ABNORMAL LOW (ref 1.12–1.23)
Chloride: 98 mmol/L (ref 96–112)
Creatinine, Ser: 0.3 mg/dL — ABNORMAL LOW (ref 0.50–1.10)
Creatinine, Ser: 0.2 mg/dL — ABNORMAL LOW (ref 0.50–1.10)
Creatinine, Ser: 0.3 mg/dL — ABNORMAL LOW (ref 0.50–1.10)
Creatinine, Ser: 0.3 mg/dL — ABNORMAL LOW (ref 0.50–1.10)
Creatinine, Ser: 0.4 mg/dL — ABNORMAL LOW (ref 0.50–1.10)
Creatinine, Ser: 0.3 mg/dL — ABNORMAL LOW (ref 0.50–1.10)
Creatinine, Ser: 0.4 mg/dL — ABNORMAL LOW (ref 0.50–1.10)
GLUCOSE: 275 mg/dL — AB (ref 70–99)
GLUCOSE: 278 mg/dL — AB (ref 70–99)
GLUCOSE: 246 mg/dL — AB (ref 70–99)
GLUCOSE: 225 mg/dL — AB (ref 70–99)
Glucose, Bld: 223 mg/dL — ABNORMAL HIGH (ref 70–99)
Glucose, Bld: 217 mg/dL — ABNORMAL HIGH (ref 70–99)
Glucose, Bld: 154 mg/dL — ABNORMAL HIGH (ref 70–99)
HCT: 31 % — ABNORMAL LOW (ref 36.0–46.0)
HCT: 26 % — ABNORMAL LOW (ref 36.0–46.0)
HCT: 34 % — ABNORMAL LOW (ref 36.0–46.0)
HEMATOCRIT: 36 % (ref 36.0–46.0)
HEMATOCRIT: 26 % — AB (ref 36.0–46.0)
HEMATOCRIT: 34 % — AB (ref 36.0–46.0)
HEMATOCRIT: 26 % — AB (ref 36.0–46.0)
HEMOGLOBIN: 10.5 g/dL — AB (ref 12.0–15.0)
HEMOGLOBIN: 8.8 g/dL — AB (ref 12.0–15.0)
Hemoglobin: 12.2 g/dL (ref 12.0–15.0)
Hemoglobin: 11.6 g/dL — ABNORMAL LOW (ref 12.0–15.0)
Hemoglobin: 8.8 g/dL — ABNORMAL LOW (ref 12.0–15.0)
Hemoglobin: 8.8 g/dL — ABNORMAL LOW (ref 12.0–15.0)
Hemoglobin: 11.6 g/dL — ABNORMAL LOW (ref 12.0–15.0)
POTASSIUM: 3.2 mmol/L — AB (ref 3.5–5.1)
POTASSIUM: 3.8 mmol/L (ref 3.5–5.1)
Potassium: 3.3 mmol/L — ABNORMAL LOW (ref 3.5–5.1)
Potassium: 3.1 mmol/L — ABNORMAL LOW (ref 3.5–5.1)
Potassium: 3.2 mmol/L — ABNORMAL LOW (ref 3.5–5.1)
Potassium: 4.7 mmol/L (ref 3.5–5.1)
Potassium: 3.5 mmol/L (ref 3.5–5.1)
SODIUM: 137 mmol/L (ref 135–145)
SODIUM: 139 mmol/L (ref 135–145)
SODIUM: 142 mmol/L (ref 135–145)
Sodium: 139 mmol/L (ref 135–145)
Sodium: 139 mmol/L (ref 135–145)
Sodium: 139 mmol/L (ref 135–145)
Sodium: 134 mmol/L — ABNORMAL LOW (ref 135–145)
TCO2: 22 mmol/L (ref 0–100)
TCO2: 21 mmol/L (ref 0–100)
TCO2: 23 mmol/L (ref 0–100)
TCO2: 22 mmol/L (ref 0–100)
TCO2: 20 mmol/L (ref 0–100)
TCO2: 22 mmol/L (ref 0–100)
TCO2: 21 mmol/L (ref 0–100)

## 2014-02-14 LAB — GLUCOSE, CAPILLARY
GLUCOSE-CAPILLARY: 131 mg/dL — AB (ref 70–99)
GLUCOSE-CAPILLARY: 132 mg/dL — AB (ref 70–99)
GLUCOSE-CAPILLARY: 144 mg/dL — AB (ref 70–99)
GLUCOSE-CAPILLARY: 128 mg/dL — AB (ref 70–99)
Glucose-Capillary: 266 mg/dL — ABNORMAL HIGH (ref 70–99)
Glucose-Capillary: 129 mg/dL — ABNORMAL HIGH (ref 70–99)
Glucose-Capillary: 114 mg/dL — ABNORMAL HIGH (ref 70–99)
Glucose-Capillary: 155 mg/dL — ABNORMAL HIGH (ref 70–99)
Glucose-Capillary: 128 mg/dL — ABNORMAL HIGH (ref 70–99)

## 2014-02-14 LAB — POCT I-STAT 4, (NA,K, GLUC, HGB,HCT)
Glucose, Bld: 170 mg/dL — ABNORMAL HIGH (ref 70–99)
HEMATOCRIT: 30 % — AB (ref 36.0–46.0)
Hemoglobin: 10.2 g/dL — ABNORMAL LOW (ref 12.0–15.0)
Potassium: 3.5 mmol/L (ref 3.5–5.1)
Sodium: 140 mmol/L (ref 135–145)

## 2014-02-14 LAB — HEPATIC FUNCTION PANEL
ALK PHOS: 68 U/L (ref 39–117)
ALT: 141 U/L — ABNORMAL HIGH (ref 0–35)
AST: 72 U/L — ABNORMAL HIGH (ref 0–37)
Albumin: 3.3 g/dL — ABNORMAL LOW (ref 3.5–5.2)
BILIRUBIN TOTAL: 0.5 mg/dL (ref 0.3–1.2)
Total Protein: 6.6 g/dL (ref 6.0–8.3)

## 2014-02-14 LAB — CBC
HEMATOCRIT: 31 % — AB (ref 36.0–46.0)
HEMATOCRIT: 32.2 % — AB (ref 36.0–46.0)
HEMOGLOBIN: 11 g/dL — AB (ref 12.0–15.0)
Hemoglobin: 10.6 g/dL — ABNORMAL LOW (ref 12.0–15.0)
MCH: 29.2 pg (ref 26.0–34.0)
MCH: 29.4 pg (ref 26.0–34.0)
MCHC: 34.2 g/dL (ref 30.0–36.0)
MCHC: 34.2 g/dL (ref 30.0–36.0)
MCV: 85.4 fL (ref 78.0–100.0)
MCV: 86.1 fL (ref 78.0–100.0)
PLATELETS: 131 10*3/uL — AB (ref 150–400)
Platelets: 188 10*3/uL (ref 150–400)
RBC: 3.63 MIL/uL — ABNORMAL LOW (ref 3.87–5.11)
RBC: 3.74 MIL/uL — AB (ref 3.87–5.11)
RDW: 13 % (ref 11.5–15.5)
RDW: 13.3 % (ref 11.5–15.5)
WBC: 13.4 10*3/uL — ABNORMAL HIGH (ref 4.0–10.5)
WBC: 14.4 10*3/uL — AB (ref 4.0–10.5)

## 2014-02-14 LAB — CREATININE, SERUM
CREATININE: 0.51 mg/dL (ref 0.50–1.10)
GFR calc non Af Amer: >90 mL/min (ref >90)

## 2014-02-14 LAB — HEMOGLOBIN AND HEMATOCRIT, BLOOD
HEMATOCRIT: 26.6 % — AB (ref 36.0–46.0)
HEMOGLOBIN: 9.2 g/dL — AB (ref 12.0–15.0)

## 2014-02-14 LAB — PROTIME-INR
INR: 1.37 (ref 0.00–1.49)
Prothrombin Time: 17 seconds — ABNORMAL HIGH (ref 11.6–15.2)

## 2014-02-14 LAB — MAGNESIUM: Magnesium: 3.2 mg/dL — ABNORMAL HIGH (ref 1.5–2.5)

## 2014-02-14 LAB — PLATELET COUNT: Platelets: 99 10*3/uL — ABNORMAL LOW (ref 150–400)

## 2014-02-14 LAB — APTT: aPTT: 33 seconds (ref 24–37)

## 2014-02-14 SURGERY — REPLACEMENT, AORTIC VALVE, OPEN
Anesthesia: General | Site: Chest

## 2014-02-14 MED ORDER — MORPHINE SULFATE 2 MG/ML IJ SOLN
1.0000 mg | INTRAMUSCULAR | Status: AC | PRN
  Administered 2014-02-14: 1 mg via INTRAVENOUS
  Filled 2014-02-14: qty 1

## 2014-02-14 MED ORDER — SODIUM CHLORIDE 0.9 % IV SOLN
INTRAVENOUS | Status: DC
  Administered 2014-02-14: 13:00:00 via INTRAVENOUS

## 2014-02-14 MED ORDER — ROCURONIUM BROMIDE 100 MG/10ML IV SOLN
INTRAVENOUS | Status: DC | PRN
  Administered 2014-02-14: 30 mg via INTRAVENOUS
  Administered 2014-02-14: 70 mg via INTRAVENOUS

## 2014-02-14 MED ORDER — FENTANYL CITRATE 0.05 MG/ML IJ SOLN
INTRAMUSCULAR | Status: AC
  Filled 2014-02-14: qty 5

## 2014-02-14 MED ORDER — LACTATED RINGERS IV SOLN: 500.0000 mL | Freq: Once | INTRAVENOUS | Status: AC | PRN

## 2014-02-14 MED ORDER — NITROGLYCERIN IN D5W 200-5 MCG/ML-% IV SOLN: 0.0000 ug/min | INTRAVENOUS | Status: DC

## 2014-02-14 MED ORDER — SODIUM CHLORIDE 0.9 % IJ SOLN
10.0000 mL | Freq: Two times a day (BID) | INTRAMUSCULAR | Status: DC
  Administered 2014-02-14 – 2014-02-16 (×3): 10 mL

## 2014-02-14 MED ORDER — SODIUM CHLORIDE 0.9 % IV SOLN
INTRAVENOUS | Status: DC
  Administered 2014-02-14: 6.7 [IU]/h via INTRAVENOUS
  Filled 2014-02-14 (×2): qty 2.5

## 2014-02-14 MED ORDER — SODIUM CHLORIDE 0.9 % IV SOLN: 250.0000 mL | INTRAVENOUS | Status: DC

## 2014-02-14 MED ORDER — LACTATED RINGERS IV SOLN
INTRAVENOUS | Status: DC | PRN
  Administered 2014-02-14 (×4): via INTRAVENOUS

## 2014-02-14 MED ORDER — PNEUMOCOCCAL VAC POLYVALENT 25 MCG/0.5ML IJ INJ
0.5000 mL | INJECTION | INTRAMUSCULAR | Status: DC
  Filled 2014-02-14: qty 0.5

## 2014-02-14 MED ORDER — GELATIN ABSORBABLE MT POWD
OROMUCOSAL | Status: DC | PRN
  Administered 2014-02-14: 20 mL via TOPICAL

## 2014-02-14 MED ORDER — DOCUSATE SODIUM 100 MG PO CAPS
200.0000 mg | ORAL_CAPSULE | Freq: Every day | ORAL | Status: DC
  Administered 2014-02-15 – 2014-02-17 (×3): 200 mg via ORAL
  Filled 2014-02-14 (×4): qty 2

## 2014-02-14 MED ORDER — OXYCODONE HCL 5 MG PO TABS
5.0000 mg | ORAL_TABLET | ORAL | Status: DC | PRN
  Administered 2014-02-14: 5 mg via ORAL
  Administered 2014-02-15: 10 mg via ORAL
  Administered 2014-02-15: 5 mg via ORAL
  Administered 2014-02-15 – 2014-02-16 (×7): 10 mg via ORAL
  Administered 2014-02-16: 5 mg via ORAL
  Administered 2014-02-17 (×2): 10 mg via ORAL
  Filled 2014-02-14 (×2): qty 1
  Filled 2014-02-14 (×9): qty 2
  Filled 2014-02-14: qty 1

## 2014-02-14 MED ORDER — SODIUM CHLORIDE 0.9 % IJ SOLN
10.0000 mL | INTRAMUSCULAR | Status: DC | PRN
  Administered 2014-02-14: 20 mL
  Filled 2014-02-14: qty 40

## 2014-02-14 MED ORDER — ARTIFICIAL TEARS OP OINT
TOPICAL_OINTMENT | OPHTHALMIC | Status: DC | PRN
  Administered 2014-02-14: 1 via OPHTHALMIC

## 2014-02-14 MED ORDER — DEXMEDETOMIDINE HCL IN NACL 200 MCG/50ML IV SOLN
0.0000 ug/kg/h | INTRAVENOUS | Status: DC
  Administered 2014-02-14: 0.7 ug/kg/h via INTRAVENOUS
  Filled 2014-02-14: qty 50

## 2014-02-14 MED ORDER — PROTAMINE SULFATE 10 MG/ML IV SOLN
INTRAVENOUS | Status: DC | PRN
  Administered 2014-02-14: 160 mg via INTRAVENOUS
  Administered 2014-02-14: 10 mg via INTRAVENOUS

## 2014-02-14 MED ORDER — METOPROLOL TARTRATE 25 MG/10 ML ORAL SUSPENSION
12.5000 mg | Freq: Two times a day (BID) | ORAL | Status: DC
  Filled 2014-02-14 (×3): qty 5

## 2014-02-14 MED ORDER — ASPIRIN 81 MG PO CHEW: 324.0000 mg | CHEWABLE_TABLET | Freq: Every day | ORAL | Status: DC

## 2014-02-14 MED ORDER — POTASSIUM CHLORIDE 10 MEQ/50ML IV SOLN
10.0000 meq | Freq: Once | INTRAVENOUS | Status: AC
  Administered 2014-02-14: 10 meq via INTRAVENOUS

## 2014-02-14 MED ORDER — CHLORHEXIDINE GLUCONATE 4 % EX LIQD
30.0000 mL | CUTANEOUS | Status: DC
  Filled 2014-02-14: qty 30

## 2014-02-14 MED ORDER — POTASSIUM CHLORIDE 10 MEQ/50ML IV SOLN
10.0000 meq | INTRAVENOUS | Status: AC
  Administered 2014-02-14 (×3): 10 meq via INTRAVENOUS

## 2014-02-14 MED ORDER — ACETAMINOPHEN 160 MG/5ML PO SOLN
650.0000 mg | Freq: Once | ORAL | Status: AC
Start: 2014-02-14 — End: 2014-02-14

## 2014-02-14 MED ORDER — PROTAMINE SULFATE 10 MG/ML IV SOLN
INTRAVENOUS | Status: AC
  Filled 2014-02-14: qty 25

## 2014-02-14 MED ORDER — ONDANSETRON HCL 4 MG/2ML IJ SOLN
4.0000 mg | Freq: Four times a day (QID) | INTRAMUSCULAR | Status: DC | PRN
  Administered 2014-02-14: 4 mg via INTRAVENOUS
  Filled 2014-02-14: qty 2

## 2014-02-14 MED ORDER — MUPIROCIN 2 % EX OINT
1.0000 "application " | TOPICAL_OINTMENT | Freq: Two times a day (BID) | CUTANEOUS | Status: DC
  Administered 2014-02-14 – 2014-02-17 (×5): 1 via NASAL
  Filled 2014-02-14: qty 22

## 2014-02-14 MED ORDER — PANTOPRAZOLE SODIUM 40 MG PO TBEC
40.0000 mg | DELAYED_RELEASE_TABLET | Freq: Every day | ORAL | Status: DC
  Administered 2014-02-16 – 2014-02-17 (×2): 40 mg via ORAL
  Filled 2014-02-14 (×2): qty 1

## 2014-02-14 MED ORDER — INSULIN REGULAR BOLUS VIA INFUSION
0.0000 [IU] | Freq: Three times a day (TID) | INTRAVENOUS | Status: DC
  Filled 2014-02-14: qty 10

## 2014-02-14 MED ORDER — METOPROLOL TARTRATE 12.5 MG HALF TABLET
12.5000 mg | ORAL_TABLET | Freq: Once | ORAL | Status: AC
  Administered 2014-02-14: 12.5 mg via ORAL
  Filled 2014-02-14: qty 1

## 2014-02-14 MED ORDER — HEMOSTATIC AGENTS (NO CHARGE) OPTIME
TOPICAL | Status: DC | PRN
  Administered 2014-02-14: 1 via TOPICAL

## 2014-02-14 MED ORDER — THROMBIN 20000 UNITS EX SOLR
CUTANEOUS | Status: AC
  Filled 2014-02-14: qty 20000

## 2014-02-14 MED ORDER — ACETAMINOPHEN 500 MG PO TABS
1000.0000 mg | ORAL_TABLET | Freq: Four times a day (QID) | ORAL | Status: DC
  Administered 2014-02-15 – 2014-02-18 (×7): 1000 mg via ORAL
  Filled 2014-02-14 (×17): qty 2

## 2014-02-14 MED ORDER — FENTANYL CITRATE 0.05 MG/ML IJ SOLN
INTRAMUSCULAR | Status: DC | PRN
Start: 2014-02-14 — End: 2014-02-14
  Administered 2014-02-14 (×3): 250 ug via INTRAVENOUS
  Administered 2014-02-14: 1000 ug via INTRAVENOUS

## 2014-02-14 MED ORDER — BISACODYL 10 MG RE SUPP: 10.0000 mg | Freq: Every day | RECTAL | Status: DC

## 2014-02-14 MED ORDER — TRAMADOL HCL 50 MG PO TABS: 50.0000 mg | ORAL_TABLET | ORAL | Status: DC | PRN

## 2014-02-14 MED ORDER — SODIUM CHLORIDE 0.9 % IJ SOLN: 3.0000 mL | INTRAMUSCULAR | Status: DC | PRN

## 2014-02-14 MED ORDER — DEXTROSE 5 % IV SOLN
1.5000 g | Freq: Two times a day (BID) | INTRAVENOUS | Status: AC
  Administered 2014-02-14 – 2014-02-16 (×4): 1.5 g via INTRAVENOUS
  Filled 2014-02-14 (×4): qty 1.5

## 2014-02-14 MED ORDER — ALBUMIN HUMAN 5 % IV SOLN
250.0000 mL | INTRAVENOUS | Status: AC | PRN
  Administered 2014-02-14 (×3): 250 mL via INTRAVENOUS
  Filled 2014-02-14: qty 250

## 2014-02-14 MED ORDER — MORPHINE SULFATE 2 MG/ML IJ SOLN
2.0000 mg | INTRAMUSCULAR | Status: DC | PRN
  Administered 2014-02-14: 2 mg via INTRAVENOUS
  Administered 2014-02-15 (×5): 4 mg via INTRAVENOUS
  Administered 2014-02-16: 2 mg via INTRAVENOUS
  Administered 2014-02-16: 4 mg via INTRAVENOUS
  Filled 2014-02-14 (×3): qty 2
  Filled 2014-02-14 (×2): qty 1
  Filled 2014-02-14 (×3): qty 2

## 2014-02-14 MED ORDER — CETYLPYRIDINIUM CHLORIDE 0.05 % MT LIQD
7.0000 mL | Freq: Two times a day (BID) | OROMUCOSAL | Status: DC
  Administered 2014-02-14 – 2014-02-17 (×6): 7 mL via OROMUCOSAL

## 2014-02-14 MED ORDER — ACETAMINOPHEN 160 MG/5ML PO SOLN
1000.0000 mg | Freq: Four times a day (QID) | ORAL | Status: DC
  Filled 2014-02-14: qty 40

## 2014-02-14 MED ORDER — MIDAZOLAM HCL 10 MG/2ML IJ SOLN
INTRAMUSCULAR | Status: AC
  Filled 2014-02-14: qty 2

## 2014-02-14 MED ORDER — CHLORHEXIDINE GLUCONATE CLOTH 2 % EX PADS
6.0000 | MEDICATED_PAD | Freq: Every day | CUTANEOUS | Status: DC
  Administered 2014-02-14 – 2014-02-17 (×4): 6 via TOPICAL

## 2014-02-14 MED ORDER — METOPROLOL TARTRATE 1 MG/ML IV SOLN: 2.5000 mg | INTRAVENOUS | Status: DC | PRN

## 2014-02-14 MED ORDER — MIDAZOLAM HCL 2 MG/2ML IJ SOLN: 2.0000 mg | INTRAMUSCULAR | Status: DC | PRN

## 2014-02-14 MED ORDER — MIDAZOLAM HCL 5 MG/5ML IJ SOLN
INTRAMUSCULAR | Status: DC | PRN
  Administered 2014-02-14: 3 mg via INTRAVENOUS
  Administered 2014-02-14: 7 mg via INTRAVENOUS

## 2014-02-14 MED ORDER — ACETAMINOPHEN 650 MG RE SUPP
650.0000 mg | Freq: Once | RECTAL | Status: AC
Start: 2014-02-14 — End: 2014-02-14
  Administered 2014-02-14: 650 mg via RECTAL

## 2014-02-14 MED ORDER — SODIUM CHLORIDE 0.9 % IJ SOLN
3.0000 mL | Freq: Two times a day (BID) | INTRAMUSCULAR | Status: DC
Start: 2014-02-15 — End: 2014-02-16
  Administered 2014-02-15 (×2): 3 mL via INTRAVENOUS

## 2014-02-14 MED ORDER — FAMOTIDINE IN NACL 20-0.9 MG/50ML-% IV SOLN
20.0000 mg | Freq: Two times a day (BID) | INTRAVENOUS | Status: AC
  Administered 2014-02-14: 20 mg via INTRAVENOUS

## 2014-02-14 MED ORDER — METOPROLOL TARTRATE 12.5 MG HALF TABLET
12.5000 mg | ORAL_TABLET | Freq: Two times a day (BID) | ORAL | Status: DC
  Administered 2014-02-15: 12.5 mg via ORAL
  Filled 2014-02-14 (×3): qty 1

## 2014-02-14 MED ORDER — VANCOMYCIN HCL IN DEXTROSE 1-5 GM/200ML-% IV SOLN
1000.0000 mg | Freq: Once | INTRAVENOUS | Status: AC
  Administered 2014-02-14: 1000 mg via INTRAVENOUS
  Filled 2014-02-14: qty 200

## 2014-02-14 MED ORDER — PROPOFOL 10 MG/ML IV BOLUS
INTRAVENOUS | Status: DC | PRN
  Administered 2014-02-14: 30 mg via INTRAVENOUS

## 2014-02-14 MED ORDER — BISACODYL 5 MG PO TBEC
10.0000 mg | DELAYED_RELEASE_TABLET | Freq: Every day | ORAL | Status: DC
  Administered 2014-02-15 – 2014-02-17 (×3): 10 mg via ORAL
  Filled 2014-02-14 (×3): qty 2

## 2014-02-14 MED ORDER — LACTATED RINGERS IV SOLN
INTRAVENOUS | Status: DC
  Administered 2014-02-14 (×2): via INTRAVENOUS

## 2014-02-14 MED ORDER — HEPARIN SODIUM (PORCINE) 1000 UNIT/ML IJ SOLN
INTRAMUSCULAR | Status: DC | PRN
  Administered 2014-02-14: 22000 [IU] via INTRAVENOUS

## 2014-02-14 MED ORDER — ASPIRIN EC 325 MG PO TBEC
325.0000 mg | DELAYED_RELEASE_TABLET | Freq: Every day | ORAL | Status: DC
Start: 2014-02-15 — End: 2014-02-15
  Filled 2014-02-14: qty 1

## 2014-02-14 MED ORDER — MAGNESIUM SULFATE 4 GM/100ML IV SOLN
4.0000 g | Freq: Once | INTRAVENOUS | Status: AC
  Administered 2014-02-14: 4 g via INTRAVENOUS
  Filled 2014-02-14: qty 100

## 2014-02-14 MED ORDER — PHENYLEPHRINE HCL 10 MG/ML IJ SOLN
10.0000 mg | INTRAVENOUS | Status: DC | PRN
  Administered 2014-02-14: 15 ug/min via INTRAVENOUS

## 2014-02-14 MED ORDER — SODIUM CHLORIDE 0.45 % IV SOLN
INTRAVENOUS | Status: DC
  Administered 2014-02-14 (×2): via INTRAVENOUS

## 2014-02-14 MED ORDER — PHENYLEPHRINE HCL 10 MG/ML IJ SOLN
0.0000 ug/min | INTRAVENOUS | Status: DC
  Administered 2014-02-14 – 2014-02-15 (×2): 10 ug/min via INTRAVENOUS
  Filled 2014-02-14 (×2): qty 2

## 2014-02-14 MED ORDER — PROPOFOL 10 MG/ML IV BOLUS
INTRAVENOUS | Status: AC
  Filled 2014-02-14: qty 20

## 2014-02-14 SURGICAL SUPPLY — 62 items
ADAPTER CARDIO PERF ANTE/RETRO (ADAPTER) ×3 IMPLANT
APPLICATOR COTTON TIP 6IN STRL (MISCELLANEOUS) ×3 IMPLANT
ATTRACTOMAT 16X20 MAGNETIC DRP (DRAPES) ×3 IMPLANT
BAG DECANTER FOR FLEXI CONT (MISCELLANEOUS) ×3 IMPLANT
BLADE STERNUM SYSTEM 6 (BLADE) ×3 IMPLANT
BLADE SURG 15 STRL LF DISP TIS (BLADE) ×2 IMPLANT
BLADE SURG 15 STRL SS (BLADE) ×1
CANISTER SUCTION 2500CC (MISCELLANEOUS) ×3 IMPLANT
CANNULA GUNDRY RCSP 15FR (MISCELLANEOUS) ×3 IMPLANT
CATH HEART VENT LEFT (CATHETERS) ×2 IMPLANT
CATH ROBINSON RED A/P 18FR (CATHETERS) ×9 IMPLANT
CATH THORACIC 36FR (CATHETERS) ×3 IMPLANT
CATH THORACIC 36FR RT ANG (CATHETERS) ×3 IMPLANT
CLIP TI WIDE RED SMALL 24 (CLIP) ×3 IMPLANT
CONT SPEC 4OZ CLIKSEAL STRL BL (MISCELLANEOUS) ×3 IMPLANT
CRADLE DONUT ADULT HEAD (MISCELLANEOUS) ×3 IMPLANT
DRAPE SLUSH/WARMER DISC (DRAPES) ×3 IMPLANT
DRSG COVADERM 4X10 (GAUZE/BANDAGES/DRESSINGS) ×3 IMPLANT
DRSG COVADERM 4X14 (GAUZE/BANDAGES/DRESSINGS) ×3 IMPLANT
ELECT CAUTERY BLADE 6.4 (BLADE) ×3 IMPLANT
ELECT REM PT RETURN 9FT ADLT (ELECTROSURGICAL) ×6
ELECTRODE REM PT RTRN 9FT ADLT (ELECTROSURGICAL) ×4 IMPLANT
GAUZE SPONGE 4X4 12PLY STRL (GAUZE/BANDAGES/DRESSINGS) ×3 IMPLANT
GLOVE BIO SURGEON STRL SZ 6 (GLOVE) ×15 IMPLANT
GLOVE BIO SURGEON STRL SZ 6.5 (GLOVE) ×12 IMPLANT
GLOVE BIO SURGEON STRL SZ7 (GLOVE) ×3 IMPLANT
GLOVE BIO SURGEON STRL SZ7.5 (GLOVE) IMPLANT
GLOVE EUDERMIC 7 POWDERFREE (GLOVE) ×6 IMPLANT
GOWN STRL REUS W/ TWL LRG LVL3 (GOWN DISPOSABLE) ×12 IMPLANT
GOWN STRL REUS W/ TWL XL LVL3 (GOWN DISPOSABLE) ×2 IMPLANT
GOWN STRL REUS W/TWL LRG LVL3 (GOWN DISPOSABLE) ×6
GOWN STRL REUS W/TWL XL LVL3 (GOWN DISPOSABLE) ×1
HEART VENT LT CURVED (MISCELLANEOUS) ×3 IMPLANT
HEMOSTAT POWDER SURGIFOAM 1G (HEMOSTASIS) ×9 IMPLANT
HEMOSTAT SURGICEL 2X14 (HEMOSTASIS) ×3 IMPLANT
KIT BASIN OR (CUSTOM PROCEDURE TRAY) ×3 IMPLANT
KIT CATH CPB BARTLE (MISCELLANEOUS) ×3 IMPLANT
KIT ROOM TURNOVER OR (KITS) ×3 IMPLANT
KIT SUCTION CATH 14FR (SUCTIONS) ×3 IMPLANT
LINE VENT (MISCELLANEOUS) ×3 IMPLANT
NS IRRIG 1000ML POUR BTL (IV SOLUTION) ×18 IMPLANT
PACK OPEN HEART (CUSTOM PROCEDURE TRAY) ×3 IMPLANT
PAD ARMBOARD 7.5X6 YLW CONV (MISCELLANEOUS) ×6 IMPLANT
SET CARDIOPLEGIA MPS 5001102 (MISCELLANEOUS) ×3 IMPLANT
SUT BONE WAX W31G (SUTURE) ×3 IMPLANT
SUT ETHIBON 2 0 V 52N 30 (SUTURE) ×6 IMPLANT
SUT PROLENE 3 0 SH DA (SUTURE) ×3 IMPLANT
SUT PROLENE 3 0 SH1 36 (SUTURE) ×3 IMPLANT
SUT PROLENE 4 0 RB 1 (SUTURE) ×4
SUT PROLENE 4-0 RB1 .5 CRCL 36 (SUTURE) ×8 IMPLANT
SUT STEEL 6MS V (SUTURE) ×6 IMPLANT
SUT VIC AB 1 CTX 36 (SUTURE) ×2
SUT VIC AB 1 CTX36XBRD ANBCTR (SUTURE) ×4 IMPLANT
SUTURE E-PAK OPEN HEART (SUTURE) ×3 IMPLANT
SYSTEM SAHARA CHEST DRAIN ATS (WOUND CARE) ×3 IMPLANT
TOWEL OR 17X24 6PK STRL BLUE (TOWEL DISPOSABLE) ×6 IMPLANT
TOWEL OR 17X26 10 PK STRL BLUE (TOWEL DISPOSABLE) ×6 IMPLANT
TRAY FOLEY IC TEMP SENS 16FR (CATHETERS) ×3 IMPLANT
UNDERPAD 30X30 INCONTINENT (UNDERPADS AND DIAPERS) ×3 IMPLANT
VALVE REGENT 21MM (Valve) ×3 IMPLANT
VENT LEFT HEART 12002 (CATHETERS) ×3
WATER STERILE IRR 1000ML POUR (IV SOLUTION) ×6 IMPLANT

## 2014-02-14 NOTE — Plan of Care (Signed)
Problem: Phase I - Pre-Op Goal: Point person for discharge identified Outcome: Completed/Met Date Met:  02/14/14 Husband Jeff Mcgaugh     

## 2014-02-14 NOTE — Progress Notes (Signed)
NIF -40 VC 1.0 L 

## 2014-02-14 NOTE — Interval H&P Note (Signed)
History and Physical Interval Note:  02/14/2014 7:25 AM  Anna Salinas  has presented today for surgery, with the diagnosis of SEVERE AS  The various methods of treatment have been discussed with the patient and family. After consideration of risks, benefits and other options for treatment, the patient has consented to  Procedure(s): AORTIC VALVE REPLACEMENT (AVR) (N/A) INTRAOPERATIVE TRANSESOPHAGEAL ECHOCARDIOGRAM (N/A) as a surgical intervention .  The patient's history has been reviewed, patient examined, no change in status, stable for surgery.  I have reviewed the patient's chart and labs.  Questions were answered to the patient's satisfaction.     Gaye Pollack

## 2014-02-14 NOTE — Anesthesia Postprocedure Evaluation (Signed)
  Anesthesia Post-op Note  Patient: Anna Salinas  Procedure(s) Performed: Procedure(s): AORTIC VALVE REPLACEMENT (AVR) (N/A) INTRAOPERATIVE TRANSESOPHAGEAL ECHOCARDIOGRAM (N/A)  Patient Location: SICU  Anesthesia Type:General  Level of Consciousness: sedated and unresponsive  Airway and Oxygen Therapy: Patient remains intubated per anesthesia plan and Patient placed on Ventilator (see vital sign flow sheet for setting)  Post-op Pain: none  Post-op Assessment: Post-op Vital signs reviewed, Patient's Cardiovascular Status Stable, Respiratory Function Stable and Patent Airway  Post-op Vital Signs: Reviewed and stable  Last Vitals:  Filed Vitals:   02/14/14 0605  BP: 126/83  Pulse: 77  Temp: 36.9 C  Resp: 20    Complications: No apparent anesthesia complications

## 2014-02-14 NOTE — Plan of Care (Signed)
Problem: Phase II - Intermediate Post-Op Goal: Maintain Hemodynamic Stability Outcome: Progressing On neosynephrine at 20 mcg and AAI paced at 88. Goal: CBGs/Blood Glucose per SCIP Criteria Outcome: Progressing On IV insulin drip. Hgb A1c 10.3.

## 2014-02-14 NOTE — Anesthesia Procedure Notes (Signed)
Anesthesia Procedure Note PA catheter:  Routine monitors. Timeout, sterile prep, drape, FBP R neck.  Trendelenburg position.  1% [place] local, finder and trocar RIJ 1st pass with US guidance.  Cordis placed over J wire. PA catheter in easily.  Sterile dressing applied.  Patient tolerated well, VSS.  Jenita Seashore, MD  [phone]

## 2014-02-14 NOTE — Transfer of Care (Signed)
Immediate Anesthesia Transfer of Care Note  Patient: Anna Salinas  Procedure(s) Performed: Procedure(s): AORTIC VALVE REPLACEMENT (AVR) (N/A) INTRAOPERATIVE TRANSESOPHAGEAL ECHOCARDIOGRAM (N/A)  Patient Location: SICU  Anesthesia Type:General  Level of Consciousness: sedated and unresponsive  Airway & Oxygen Therapy: Patient remains intubated per anesthesia plan and Patient placed on Ventilator (see vital sign flow sheet for setting)  Post-op Assessment: Post -op Vital signs reviewed and stable  Post vital signs: Reviewed and stable  Last Vitals:  Filed Vitals:   02/14/14 0605  BP: 126/83  Pulse: 77  Temp: 36.9 C  Resp: 20    Complications: No apparent anesthesia complications

## 2014-02-14 NOTE — Progress Notes (Signed)
S/p AVR  Extubated  BP 94/55 mmHg  Pulse 88  Temp(Src) 98.4 F (36.9 C) (Oral)  Resp 14  Ht 5\' 7"  (1.702 m)  Wt 221 lb 1.9 oz (100.3 kg)  BMI 34.62 kg/m2  SpO2 100%  LMP 01/18/2014 (Within Days)   Intake/Output Summary (Last 24 hours) at 02/14/14 1803 Last data filed at 02/14/14 1600  Gross per 24 hour  Intake 4799.34 ml  Output   3445 ml  Net 1354.34 ml   Minimal CT output  CBG 170, insulin gtt at 5   Doing well early postop

## 2014-02-14 NOTE — Op Note (Signed)
CARDIOVASCULAR SURGERY OPERATIVE NOTE  02/14/2014 Anna Salinas [id number]  Surgeon:  Gaye Pollack, MD  First Assistant: Lars Pinks, PA-C   Preoperative Diagnosis:  Severe aortic stenosis   Postoperative Diagnosis:  Same   Procedure:  1. Median Sternotomy 2. Extracorporeal circulation 3.   Aortic valve replacement using a 21 mm St. Jude Regent Mechanical valve.  Anesthesia:  General Endotracheal   Clinical History/Surgical Indication:  The patient is a 44 year old woman with a history of hypertension and known aortic stenosis since she was evaluated for a heart murmur by Dr. Domenic Polite in 2012. The valve was felt to be tri-leaflet with a mean gradient of 15 mm Hg and a peak of 28 mm Hg. An echo on 11/01/2013 showed progression of her aortic stenosis with a mean gradient of 31 mm Hg, peak of 68 mm Hg, and a valve area of 0.95 cm2 by Vmax. The LV function was normal. She does report development of symptoms over the past 6 months with shortness of breath going up stairs or doing more than mild exertion. She has had some dizziness and fatigue as well as occasional chest pressure. Left and right heart pressures were normal. The AV mean gradient was 24 mm Hg with an AVA of 1.0 cm2.   A recent cath showed no coronary disease. The ascending aorta appeared mildly dilated.  CTA of the chest shows no significant enlargement of the aortic root or ascending aorta or arch.   Preparation:  The patient was seen in the preoperative holding area and the correct patient, correct operation were confirmed with the patient after reviewing the medical record and catheterization. The consent was signed by me. Preoperative antibiotics were given. A pulmonary arterial line and radial arterial line were placed by the anesthesia team. The patient was taken back to the operating room and positioned supine on the operating room table. After being placed under general endotracheal anesthesia by the  anesthesia team a foley catheter was placed. The neck, chest, abdomen, and both legs were prepped with betadine soap and solution and draped in the usual sterile manner. A surgical time-out was taken and the correct patient and operative procedure were confirmed with the nursing and anesthesia staff.   Pre-bypass TEE:   Complete TEE assessment was performed by Dr. Annye Asa. This showed moderate aortic stenosis with a bicuspid aortic valve.     Post-bypass TEE:   Normal functioning prosthetic aortic valve with no perivalvular leak or regurgitation through the valve. Left ventricular function preserved. no mitral regurgitation.    Cardiopulmonary Bypass:  A median sternotomy was performed. The pericardium was opened in the midline. Right ventricular function appeared normal. The ascending aorta was of normal size and had no palpable plaque. There were no contraindications to aortic cannulation or cross-clamping. The patient was fully systemically heparinized and the ACT was maintained > 400 sec. The proximal aortic arch was cannulated with a 20 F aortic cannula for arterial inflow. Venous cannulation was performed via the right atrial appendage using a two-staged venous cannula. An antegrade cardioplegia/vent cannula was inserted into the mid-ascending aorta. A left ventricular vent was placed via the right superior pulmonary vein. A retrograde cardioplegia cannnula was placed into the coronary sinus via the right atrium. Aortic occlusion was performed with a single cross-clamp. Systemic cooling to 32 degrees Centigrade and topical cooling of the heart with iced saline were used. Hyperkalemic antegrade cold blood cardioplegia was used to induce diastolic arrest and then cold blood  retrograde cardioplegia was given at about 20 minute intervals throughout the period of arrest to maintain myocardial temperature at or below 10 degrees centigrade. A temperature probe was inserted into the  interventricular septum and an insulating pad was placed in the pericardium. Carbon dioxide was insufflated into the pericardium at 5L/min throughout the procedure to minimize intracardiac air.   Aortic Valve Replacement:   The ascending aorta was relatively short and did not appear enlarged at all. A transverse aortotomy was performed 1 cm above the take-off of the right coronary artery. The native valve was bicuspid with calcified and thickened leaflets and minimal annular calcification. The right and non-coronary leaflets were fused with raphe.The ostia of the coronary arteries were in normal position and were not obstructed. The native valve leaflets were excised and the annulus was decalcified with rongeurs. Care was taken to remove all particulate debris. The left ventricle was directly inspected for debris and then irrigated with ice saline solution. The annulus was sized and a size 21 mm St. Jude Regent mechanical valve was chosen. The model number was [id number]  and the serial number was [id number]. While the valve was being prepared 2-0 Ethibond pledgeted horizontal mattress sutures were placed around the annulus with the pledgets in a sub-annular position. The sutures were placed through the sewing ring and the valve lowered into place. The sutures were tied sequentially. The valve seated nicely and the coronary ostia were not obstructed. The prosthetic valve leaflets moved normally and there was no sub-valvular obstruction. The aortotomy was closed using 4-0 Prolene suture in 2 layers with felt strips to reinforce the closure.  Completion:  The patient was rewarmed to 37 degrees Centigrade. De-airing maneuvers were performed and the head placed in trendelenburg position. The crossclamp was removed with a time of 98 minutes. There was spontaneous return of sinus rhythm. The aortotomy was checked for hemostasis. Two temporary epicardial pacing wires were placed on the right atrium and two on the  right ventricle. The left ventricular vent and retrograde cardioplegia cannulas were removed. The patient was weaned from CPB without difficulty on no inotropes. CPB time was 131 minutes. Cardiac output was 6 LPM. Heparin was fully reversed with protamine and the aortic and venous cannulas removed. Hemostasis was achieved. Mediastinal drainage tubes were placed. The sternum was closed with  #6 stainless steel wires. The fascia was closed with continuous # 1 vicryl suture. The subcutaneous tissue was closed with 2-0 vicryl continuous suture. The skin was closed with 3-0 vicryl subcuticular suture. All sponge, needle, and instrument counts were reported correct at the end of the case. Dry sterile dressings were placed over the incisions and around the chest tubes which were connected to pleurevac suction. The patient was then transported to the surgical intensive care unit in critical but stable condition.

## 2014-02-14 NOTE — Procedures (Signed)
Extubation Procedure Note  Patient Details:   Name: VIVIEN BARRETTO DOB: 25-Jan-1970 MRN: [id number]   Airway Documentation:     Evaluation  O2 sats: stable throughout Complications: No apparent complications Patient did tolerate procedure well. Bilateral Breath Sounds: Clear, Diminished Suctioning: Airway Yes   Patient extubated to [place]. Patient tolerated well. Vital signs stable. No complications. RN at bedside. RT will continue to monitor.  Mcneil Sober 02/14/2014, 5:43 PM

## 2014-02-14 NOTE — H&P (Signed)
[name]             [phone]      Cardiothoracic Surgery History and Physical   PCP is Chevis Pretty, FNP Referring Provider is Martinique, Peter M, MD  Chief Complaint  Patient presents with  . Aortic Stenosis    Surgical eval for possible AVR, cardiac cath 01/25/14    HPI:  The patient is a 44 year old woman with a history of hypertension and known aortic stenosis since she was evaluated for a heart murmur by Dr. Domenic Polite in 2012. The valve was felt to be tri-leaflet with a mean gradient of 15 mm Hg and a peak of 28 mm Hg. An echo on 11/01/2013 showed progression of her aortic stenosis with a mean gradient of 31 mm Hg, peak of 68 mm Hg, and a valve area of 0.95 cm2 by Vmax. The LV function was normal. She does report development of symptoms over the past 6 months with shortness of breath going up stairs or doing more than mild exertion. She has had some dizziness and fatigue as well as occasional chest pressure. Left and right heart pressures were normal. The AV mean gradient was 24 mm Hg with an AVA of 1.0 cm2.   A recent cath showed no coronary disease. The ascending aorta appeared mildly dilated.  CTA of the chest shows no significant enlargement of the aortic root or ascending aorta or arch.  She was seen by her dentist and had one tooth that needed to be extracted preop and this was done.   Past Medical History  Diagnosis Date  . Essential hypertension, benign   . Palpitations   . Prediabetes   . Aortic stenosis due to bicuspid aortic valve     Past Surgical History  Procedure Laterality Date  . Cesarean section  2009  . Tubal ligation    . Left and right heart catheterization with coronary angiogram N/A 01/25/2014    Procedure: LEFT AND RIGHT HEART CATHETERIZATION WITH CORONARY ANGIOGRAM; Surgeon: Peter M Martinique, MD; Location: [facility] CATH LAB; Service:  Cardiovascular; Laterality: N/A;    Family History  Problem Relation Age of Onset  . Diabetes Mother   . Brain cancer Brother   . Cancer Maternal Grandmother     Social History History  Substance Use Topics  . Smoking status: Never Smoker   . Smokeless tobacco: Never Used  . Alcohol Use: No    Current Outpatient Prescriptions  Medication Sig Dispense Refill  . lisinopril-hydrochlorothiazide (PRINZIDE,ZESTORETIC) 10-12.5 MG per tablet Take 1 tablet by mouth daily.      No current facility-administered medications for this visit.    Allergies  Allergen Reactions  . Glipizide Nausea And Vomiting and Other (See Comments)    GI upset  . Shellfish Allergy Swelling    Review of Systems  Constitutional: Positive for activity change and fatigue. Negative for fever, chills, diaphoresis, appetite change and unexpected weight change.  HENT: Negative.  Eyes: Negative.  Respiratory: Positive for shortness of breath.  Cardiovascular: Positive for chest pain. Negative for palpitations and leg swelling.  Gastrointestinal: Negative.  Endocrine: Negative.  Genitourinary: Negative.  Musculoskeletal: Negative.  Skin: Negative.  Allergic/Immunologic: Negative.  Neurological: Positive for dizziness. Negative for syncope.  Hematological: Negative.  Psychiatric/Behavioral: Negative.    BP 109/80 mmHg  Pulse 78  Resp 20  Ht 5\' 7"  (1.702 m)  Wt 223 lb (101.152  kg)  BMI 34.92 kg/m2  SpO2 99%  LMP 01/18/2014 (Within Days) Physical Exam  Constitutional: She is oriented to person, place, and time. She appears well-developed and well-nourished. No distress.  Obese HENT:  Head: Normocephalic and atraumatic.  Mouth/Throat: Oropharynx is clear and moist.  Teeth in fair condition. Sees her dentist every 6 months.  Eyes: EOM are normal. Pupils are equal, round, and reactive to light.  Neck: Normal range of motion.  Neck supple. No JVD present. No thyromegaly present.  Cardiovascular: Normal rate, regular rhythm and intact distal pulses.  Murmur heard. 3/6 systolic murmur along RSB  Pulmonary/Chest: Effort normal and breath sounds normal. No respiratory distress. She has no wheezes. She has no rales.  Abdominal: Soft. Bowel sounds are normal. She exhibits no distension and no mass. There is no tenderness.  Musculoskeletal: Normal range of motion. She exhibits no edema.  Lymphadenopathy:   She has no cervical adenopathy.  Neurological: She is alert and oriented to person, place, and time. She has normal strength. No cranial nerve deficit or sensory deficit.  Skin: Skin is warm and dry.  Psychiatric: She has a normal mood and affect.     Diagnostic Tests:   *[facility] [place], [place], [place] [address]              [phone]  ------------------------------------------------------------------- Transthoracic Echocardiography  Patient:  Anna Salinas, Anna Salinas MR #:    [id number] Study Date: 11/01/2013 Gender:   F Age:    62 Height:   172.7 cm Weight:   101.2 kg BSA:    2.24 m^2 Pt. Status: Room:  ATTENDING  Peter Martinique, M.D. ORDERING   Peter Martinique, M.D. REFERRING  Peter Martinique, M.D. SONOGRAPHER Marygrace Drought, RCS PERFORMING  [facility], Outpatient  cc:  ------------------------------------------------------------------- LV EF: 65% -  70%  ------------------------------------------------------------------- Indications:   Murmur 785.2.  ------------------------------------------------------------------- History:  PMH:  Dyspnea. Aortic valve disease. Risk factors: Hypertension.  ------------------------------------------------------------------- Study Conclusions  - Left ventricle: The cavity size was normal. Systolic function was vigorous.  The estimated ejection fraction was in the range of 65% to 70%. Wall motion was normal; there were no regional wall motion abnormalities. Doppler parameters are consistent with abnormal left ventricular relaxation (grade 1 diastolic dysfunction). - Aortic valve: A bicuspid morphology cannot be excluded; moderately thickened, moderately calcified leaflets. Valve mobility was restricted. There was moderate to severe stenosis. Peak velocity (S): 412 cm/s. Mean gradient (S): 31 mm Hg. Valve area (Vmax): 0.95 cm^2.  Impressions:  - When compared to 2012 echocardiogram, aortic stenosis has advanced (previously peak velocity 2.73m/s).  Transthoracic echocardiography. M-mode, complete 2D, spectral Doppler, and color Doppler. Birthdate: Patient birthdate: June 23, 1970. Age: Patient is 44 yr old. Sex: Gender: female. BMI: 33.9 kg/m^2. Blood pressure:   128/78 Patient status: Outpatient. Study date: Study date: 11/01/2013. Study time: 02:51 PM. Location: Echo laboratory.  -------------------------------------------------------------------  ------------------------------------------------------------------- Left ventricle: The cavity size was normal. Systolic function was vigorous. The estimated ejection fraction was in the range of 65% to 70%. Wall motion was normal; there were no regional wall motion abnormalities. The transmitral flow pattern was normal. The deceleration time of the early transmitral flow velocity was normal. The pulmonary vein flow pattern was normal. The tissue Doppler parameters were normal. Doppler parameters are consistent with abnormal left ventricular relaxation (grade 1 diastolic dysfunction).  ------------------------------------------------------------------- Aortic valve:  A bicuspid  morphology cannot be excluded; moderately thickened, moderately calcified leaflets. Valve mobility was restricted. Doppler:  There was moderate  to severe stenosis. There was no regurgitation.  Valve area (VTI): 0.82 cm^2. Indexed valve area (VTI): 0.37 cm^2/m^2. Peak velocity ratio of LVOT to aortic valve: 0.3. Valve area (Vmax): 0.95 cm^2. Indexed valve area (Vmax): 0.43 cm^2/m^2.  Mean gradient (S): 31 mm Hg. Peak gradient (S): 68 mm Hg.  ------------------------------------------------------------------- Aorta: The aorta was normal, not dilated, and non-diseased.  ------------------------------------------------------------------- Mitral valve:  Structurally normal valve.  Leaflet separation was normal. Doppler: Transvalvular velocity was within the normal range. There was no evidence for stenosis. There was no regurgitation.  Peak gradient (D): 4 mm Hg.  ------------------------------------------------------------------- Left atrium: LA volume/ BSA=15.9 ml/m2. The atrium was normal in size.  ------------------------------------------------------------------- Right ventricle: The cavity size was normal. Wall thickness was normal. Systolic function was normal.  ------------------------------------------------------------------- Pulmonic valve:  Poorly visualized. Structurally normal valve. Cusp separation was normal. Doppler: Transvalvular velocity was within the normal range. There was trivial regurgitation.  ------------------------------------------------------------------- Tricuspid valve:  Structurally normal valve.  Leaflet separation was normal. Doppler: Transvalvular velocity was within the normal range. There was trivial regurgitation.  ------------------------------------------------------------------- Pulmonary artery:  The main pulmonary artery was normal-sized.  ------------------------------------------------------------------- Right atrium: The atrium was normal in size.  ------------------------------------------------------------------- Pericardium: The pericardium was  normal in appearance. There was no pericardial effusion.  ------------------------------------------------------------------- Systemic veins: Inferior vena cava: The vessel was normal in size. The respirophasic diameter changes were in the normal range (= 50%), consistent with normal central venous pressure. Diameter: 11 mm.  ------------------------------------------------------------------- Post procedure conclusions Ascending Aorta:  - The aorta was normal, not dilated, and non-diseased.  ------------------------------------------------------------------- Measurements  IVC                    Value     Reference ID                    11  mm    ---------  Left ventricle              Value     Reference LV ID, ED, PLAX chordal      (L)   34.4 mm    43 - 52 LV ID, ES, PLAX chordal          23.2 mm    23 - 38 LV fx shortening, PLAX chordal      33  %    >=29 LV PW thickness, ED            9.82 mm    --------- IVS/LV PW ratio, ED            0.7      <=1.3 Stroke volume, 2D             77  ml    --------- Stroke volume/bsa, 2D           34  ml/m^2  --------- LV e&', lateral              14.3 cm/s   --------- LV E/e&', lateral             6.7      --------- LV e&', medial               8.99 cm/s   --------- LV E/e&', medial              10.66     --------- LV e&', average  11.65 cm/s   --------- LV E/e&', average             8.23      ---------  Ventricular septum            Value     Reference IVS thickness, ED             6.86 mm    ---------  LVOT                   Value     Reference LVOT ID, S                 20  mm    --------- LVOT area                 3.14 cm^2   --------- LVOT peak velocity, S           125  cm/s   --------- LVOT peak gradient, S           6   mm Hg  ---------  Aortic valve               Value     Reference Aortic valve peak velocity, S       412  cm/s   --------- Aortic mean gradient, S          31  mm Hg  --------- Aortic peak gradient, S          68  mm Hg  --------- Aortic valve area, VTI          0.82 cm^2   --------- Aortic valve area/bsa, VTI        0.37 cm^2/m^2 --------- Velocity ratio, peak, LVOT/AV       0.3      --------- Aortic valve area, peak velocity     0.95 cm^2   --------- Aortic valve area/bsa, peak        0.43 cm^2/m^2 --------- velocity  Aorta                   Value     Reference Aortic root ID, ED            30  mm    ---------  Left atrium                Value     Reference LA ID, A-P, ES              33  mm    --------- LA ID/bsa, A-P              1.47 cm/m^2  <=2.2 LA volume, ES, 1-p A4C          29  ml    --------- LA volume/bsa, ES, 1-p A4C        12.9 ml/m^2  --------- LA volume, ES, 1-p A2C          33  ml    --------- LA volume/bsa, ES, 1-p A2C        14.7 ml/m^2  ---------  Mitral valve               Value     Reference Mitral E-wave peak velocity        95.8 cm/s   --------- Mitral A-wave peak velocity        70.1 cm/s   --------- Mitral deceleration time         208  ms  150 - 230 Mitral peak gradient, D          4   mm Hg  --------- Mitral E/A ratio, peak          1.4      ---------  Pulmonary arteries             Value     Reference PA pressure, S, DP            16  mm Hg  <=30  Tricuspid valve              Value     Reference Tricuspid regurg peak velocity      182  cm/s   --------- Tricuspid peak RV-RA gradient       13  mm Hg  --------- Tricuspid maximal regurg         182  cm/s   --------- velocity, PISA  Systemic veins              Value     Reference Estimated CVP               3   mm Hg  ---------  Right ventricle              Value     Reference RV pressure, S, DP            16  mm Hg  <=30 RV s&', lateral, S             13.9 cm/s   ---------  Legend: (L) and (H) mark values outside specified reference range.  ------------------------------------------------------------------- Prepared and Electronically Authenticated by  Candee Furbish, M.D. 2015-10-19T18:06:05    Cardiac Catheterization Procedure Note  Name: Anna Salinas MRN: [id number] DOB: [birth year]  Procedure: Right Heart Cath, Left Heart Cath, Selective Coronary Angiography, LV angiography, Aortic root angiography.  Indication: 44 yo WF with moderate to severe aortic stenosis that has progressed since 2012 and is now symptomatic.   Procedural Details: The right wrist was prepped, draped, and anesthetized with 1% lidocaine. Using the modified Seldinger technique a 6 Fr slender sheath was placed in the right radial artery and a 5 French sheath was placed in the right brachial vein. A Swan-Ganz catheter was used for the right heart catheterization. Standard protocol was followed for recording of right heart pressures and sampling of oxygen saturations. Fick cardiac output was calculated. Standard Judkins catheters were used for selective coronary angiography and left ventriculography. It was very difficult to cross the AV. Initially unable  to cross with a pigtail catheter, LA1, or MP. Finally crossed with the JR4 catheter and straight wire. There were no immediate procedural complications. The patient was transferred to the post catheterization recovery area for further monitoring.  Procedural Findings: Hemodynamics RA 8/7 mean 5 mm Hg RV 27/6 mm Hg PA 23/6 mean 14 mm Hg PCWP 9/8 mean 7 mm Hg LV 144/16 mm Hg AO 110/73 mean 90 mm Hg  AV gradient mean 24 mm Hg AV area 1.0 cm squared.  Oxygen saturations: PA 70% AO 98%  Cardiac Output (Fick) 5.1 L/min  Cardiac Index (Fick) 2.4 L/min/meter squared.   Coronary angiography: Coronary dominance: right  Left mainstem: Normal  Left anterior descending (LAD): Normal  Left circumflex (LCx): Normal  Right coronary artery (RCA): Normal  Left ventriculography: Left ventricular systolic function is normal, LVEF is estimated at 65%, there is no significant mitral regurgitation   The proximal aorta is dilated. There is  no significant aortic insufficiency. The AV is calcified with restricted motion.  Final Conclusions:  1. Normal coronary anatomy 2. Moderate to severe aortic stenosis 3. Normal LV function 4. Normal right heart pressures. 5. Dilated aortic root.   Recommendations: Consider AV replacement +/- aortic root grafting for progressive aortic stenosis.    Peter Martinique, [place] 01/25/2014, 10:25 AM    Impression:  I have personally reviewed her echo and cath films and her CTA of the chest. Her aortic valve is very calcified with marked restriction to leaflet opening and looks like severe AS. Her transvalvular gradient by echo is almost in the severe range. She is symptomatic and I agree with Dr. Martinique that it is time to replace her valve. The sinus portion of the aorta appears normal. Above the sinotubular junction there is mild fusiform dilatation with a diameter of 3.5 cm at the level of the right pulmonary artery. The proximal aortic arch is 3.3  cm and the distal arch is 2.1 cm. The descending aorta is 2.25 cm. There is no indication for replacement of her aorta. I reviewed the scan with her and her family and my recommendation for leaving the aorta alone. I discussed the small risk that it may progressively enlarge and require intervention in the future. I discussed the operative procedure again with the patient and family including alternatives, benefits and risks; including but not limited to bleeding, blood transfusion, infection, stroke, myocardial infarction, heart block requiring a permanent pacemaker, organ dysfunction, and death. Anna Salinas understands and agrees to proceed. I discussed the pros and cons of mechanical and tissue valves and my strong recommendation for a mechanical valve given her age. She has already had children and a tubal ligation. She feels that compliance with coumadin would not be a problem and is in agreement with a mechanical valve.  Plan:  AVR with a mechanical valve on Monday 02/14/2014.

## 2014-02-14 NOTE — Progress Notes (Signed)
  Echocardiogram Echocardiogram Transesophageal has been performed.  Darlina Sicilian M 02/14/2014, 8:29 AM

## 2014-02-14 NOTE — Brief Op Note (Addendum)
02/14/2014  11:01 AM  PATIENT:  Anna Salinas  44 y.o. female  PRE-OPERATIVE DIAGNOSIS:  Severe AS  POST-OPERATIVE DIAGNOSIS: Severe AS  PROCEDURE:  INTRAOPERATIVE TRANSESOPHAGEAL ECHOCARDIOGRAM, MEDIAN STERNOTOMY for AORTIC VALVE REPLACEMENT (AVR)   SURGEON:  Surgeon(s) and Role:    * Gaye Pollack, MD - Primary  PHYSICIAN ASSISTANT: Lars Pinks PA-C  ANESTHESIA:   general  EBL:  Total I/O In: -  Out: 1400 [Urine:1400]  BLOOD ADMINISTERED:One PLTS  DRAINS: Chest tubes placed in the mediastinal and pleural spaces    PRE OP WEIGHT: 100 kg  Aortic Valve Etiology   Aortic Insufficiency:  Trivial/Trace  Aortic Valve Disease:  Yes.  Aortic Stenosis:  Yes. Smallest Aortic Valve Area: 0.95cm2; Highest Mean Gradient: 25mmHg.  Etiology (Choose at least one and up to  5 etiologies):  Bicuspid valve disease    SPECIMEN:  Source of Specimen:  Native aortic valve leaflets  DISPOSITION OF SPECIMEN:  PATHOLOGY  COUNTS CORRECT:  YES  DICTATION: .Dragon Dictation  PLAN OF CARE: Admit to inpatient   PATIENT DISPOSITION:  ICU - intubated and hemodynamically stable.   Delay start of Pharmacological VTE agent (>24hrs) due to surgical blood loss or risk of bleeding: yes  Aortic Valve  Procedure Performed:  Replacement: Yes.  Mechanical Valve. Implant Model Number:[id number], Size:21, Unique Device Identifier:[id number]  Repair/Reconstruction: No.   Aortic Annular Enlargement: No.

## 2014-02-14 NOTE — Anesthesia Preprocedure Evaluation (Addendum)
Anesthesia Evaluation  Patient identified by MRN, date of birth, ID band Patient awake    Reviewed: Allergy & Precautions, NPO status , Patient's Chart, lab work & pertinent test results, reviewed documented beta blocker date and time   History of Anesthesia Complications Negative for: history of anesthetic complications  Airway Mallampati: II  TM Distance: >3 FB Neck ROM: Full    Dental  (+) Dental Advisory Given   Pulmonary neg pulmonary ROS,  breath sounds clear to auscultation        Cardiovascular hypertension, Pt. on medications - angina- CAD Rhythm:Regular Rate:Normal + Systolic murmurs '15 ECHO: EF 75%, grade 1 diastolic dysfunction,  bicuspid aortic valve cannot be excluded; moderately thickened, moderately calcified leaflets. Valve mobility was restricted. There was moderate to severe stenosis. Peak velocity (S): 412 cm/s. Mean gradient (S): 31 mm Hg. Valve area (Vmax): 0.95 cm^2.   Neuro/Psych negative neurological ROS     GI/Hepatic negative GI ROS, Slightly elevated LFTs   Endo/Other  diabetes (no meds, diet controlled, glu 266)Morbid obesity  Renal/GU negative Renal ROS     Musculoskeletal   Abdominal (+) + obese,   Peds  Hematology negative hematology ROS (+)   Anesthesia Other Findings   Reproductive/Obstetrics 02/10/14 preg test: NEG                            Anesthesia Physical Anesthesia Plan  ASA: III  Anesthesia Plan: General   Post-op Pain Management:    Induction: Intravenous  Airway Management Planned: Oral ETT  Additional Equipment: Arterial line, PA Cath, 3D TEE and Ultrasound Guidance Line Placement  Intra-op Plan:   Post-operative Plan: Post-operative intubation/ventilation  Informed Consent: I have reviewed the patients History and Physical, chart, labs and discussed the procedure including the risks, benefits and alternatives for the proposed  anesthesia with the patient or authorized representative who has indicated his/her understanding and acceptance.   Dental advisory given  Plan Discussed with: CRNA and Surgeon  Anesthesia Plan Comments: (Plan routine monitors, A line, PA cath, GETA with TEE and post op ventilation)        Anesthesia Quick Evaluation

## 2014-02-15 ENCOUNTER — Inpatient Hospital Stay (HOSPITAL_COMMUNITY): Payer: BLUE CROSS/BLUE SHIELD

## 2014-02-15 ENCOUNTER — Encounter (HOSPITAL_COMMUNITY): Payer: Self-pay | Admitting: Surgery

## 2014-02-15 LAB — POCT I-STAT, CHEM 8
BUN: 7 mg/dL (ref 6–23)
CREATININE: 0.6 mg/dL (ref 0.50–1.10)
Calcium, Ion: 1.16 mmol/L (ref 1.12–1.23)
Chloride: 102 mmol/L (ref 96–112)
GLUCOSE: 180 mg/dL — AB (ref 70–99)
HEMATOCRIT: 29 % — AB (ref 36.0–46.0)
Hemoglobin: 9.9 g/dL — ABNORMAL LOW (ref 12.0–15.0)
Potassium: 4.1 mmol/L (ref 3.5–5.1)
Sodium: 138 mmol/L (ref 135–145)
TCO2: 21 mmol/L (ref 0–100)

## 2014-02-15 LAB — GLUCOSE, CAPILLARY
GLUCOSE-CAPILLARY: 119 mg/dL — AB (ref 70–99)
GLUCOSE-CAPILLARY: 129 mg/dL — AB (ref 70–99)
GLUCOSE-CAPILLARY: 139 mg/dL — AB (ref 70–99)
GLUCOSE-CAPILLARY: 149 mg/dL — AB (ref 70–99)
GLUCOSE-CAPILLARY: 172 mg/dL — AB (ref 70–99)
GLUCOSE-CAPILLARY: 175 mg/dL — AB (ref 70–99)
GLUCOSE-CAPILLARY: 190 mg/dL — AB (ref 70–99)
Glucose-Capillary: 100 mg/dL — ABNORMAL HIGH (ref 70–99)
Glucose-Capillary: 107 mg/dL — ABNORMAL HIGH (ref 70–99)
Glucose-Capillary: 135 mg/dL — ABNORMAL HIGH (ref 70–99)
Glucose-Capillary: 159 mg/dL — ABNORMAL HIGH (ref 70–99)
Glucose-Capillary: 177 mg/dL — ABNORMAL HIGH (ref 70–99)
Glucose-Capillary: 191 mg/dL — ABNORMAL HIGH (ref 70–99)

## 2014-02-15 LAB — CBC
HCT: 31.8 % — ABNORMAL LOW (ref 36.0–46.0)
HCT: 29.3 % — ABNORMAL LOW (ref 36.0–46.0)
HEMOGLOBIN: 10.7 g/dL — AB (ref 12.0–15.0)
HEMOGLOBIN: 9.8 g/dL — AB (ref 12.0–15.0)
MCH: 29.6 pg (ref 26.0–34.0)
MCH: 30 pg (ref 26.0–34.0)
MCHC: 33.6 g/dL (ref 30.0–36.0)
MCHC: 33.4 g/dL (ref 30.0–36.0)
MCV: 88.1 fL (ref 78.0–100.0)
MCV: 89.6 fL (ref 78.0–100.0)
PLATELETS: 167 10*3/uL (ref 150–400)
Platelets: 130 10*3/uL — ABNORMAL LOW (ref 150–400)
RBC: 3.61 MIL/uL — ABNORMAL LOW (ref 3.87–5.11)
RBC: 3.27 MIL/uL — ABNORMAL LOW (ref 3.87–5.11)
RDW: 13.6 % (ref 11.5–15.5)
RDW: 14 % (ref 11.5–15.5)
WBC: 15 10*3/uL — AB (ref 4.0–10.5)
WBC: 12.6 10*3/uL — ABNORMAL HIGH (ref 4.0–10.5)

## 2014-02-15 LAB — BASIC METABOLIC PANEL
ANION GAP: 3 — AB (ref 5–15)
BUN: 7 mg/dL (ref 6–23)
CALCIUM: 7.3 mg/dL — AB (ref 8.4–10.5)
CO2: 24 mmol/L (ref 19–32)
CREATININE: 0.53 mg/dL (ref 0.50–1.10)
Chloride: 109 mmol/L (ref 96–112)
GFR calc Af Amer: >90 mL/min (ref >90)
GFR calc non Af Amer: >90 mL/min (ref >90)
Glucose, Bld: 169 mg/dL — ABNORMAL HIGH (ref 70–99)
Potassium: 3.6 mmol/L (ref 3.5–5.1)
SODIUM: 136 mmol/L (ref 135–145)

## 2014-02-15 LAB — PREPARE PLATELET PHERESIS: Unit division: 0

## 2014-02-15 LAB — CREATININE, SERUM
Creatinine, Ser: 0.69 mg/dL (ref 0.50–1.10)
GFR calc Af Amer: >90 mL/min (ref >90)
GFR calc non Af Amer: >90 mL/min (ref >90)

## 2014-02-15 LAB — MAGNESIUM
Magnesium: 2.4 mg/dL (ref 1.5–2.5)
Magnesium: 2 mg/dL (ref 1.5–2.5)

## 2014-02-15 LAB — TYPE AND SCREEN
ABO/RH(D): A POS
ANTIBODY SCREEN: NEGATIVE

## 2014-02-15 MED ORDER — INSULIN ASPART 100 UNIT/ML ~~LOC~~ SOLN
0.0000 [IU] | SUBCUTANEOUS | Status: DC
  Administered 2014-02-15: 4 [IU] via SUBCUTANEOUS
  Administered 2014-02-15: 2 [IU] via SUBCUTANEOUS
  Administered 2014-02-16 (×3): 4 [IU] via SUBCUTANEOUS

## 2014-02-15 MED ORDER — POTASSIUM CHLORIDE 10 MEQ/50ML IV SOLN
10.0000 meq | INTRAVENOUS | Status: AC | PRN
  Administered 2014-02-15 (×3): 10 meq via INTRAVENOUS
  Filled 2014-02-15: qty 50

## 2014-02-15 MED ORDER — FUROSEMIDE 10 MG/ML IJ SOLN
40.0000 mg | Freq: Once | INTRAMUSCULAR | Status: AC
  Administered 2014-02-15: 40 mg via INTRAVENOUS
  Filled 2014-02-15: qty 4

## 2014-02-15 MED ORDER — KETOROLAC TROMETHAMINE 30 MG/ML IJ SOLN
30.0000 mg | Freq: Four times a day (QID) | INTRAMUSCULAR | Status: DC
  Administered 2014-02-15: 30 mg via INTRAVENOUS
  Filled 2014-02-15: qty 1

## 2014-02-15 MED ORDER — POTASSIUM CHLORIDE CRYS ER 20 MEQ PO TBCR
40.0000 meq | EXTENDED_RELEASE_TABLET | Freq: Once | ORAL | Status: AC
  Administered 2014-02-15: 40 meq via ORAL
  Filled 2014-02-15: qty 2

## 2014-02-15 MED ORDER — WARFARIN - PHYSICIAN DOSING INPATIENT
Freq: Every day | Status: DC
  Administered 2014-02-15 – 2014-02-17 (×3)

## 2014-02-15 MED ORDER — METOPROLOL TARTRATE 25 MG PO TABS
25.0000 mg | ORAL_TABLET | Freq: Two times a day (BID) | ORAL | Status: DC
Start: 2014-02-15 — End: 2014-02-15
  Filled 2014-02-15: qty 1

## 2014-02-15 MED ORDER — METOPROLOL TARTRATE 25 MG/10 ML ORAL SUSPENSION: 25.0000 mg | Freq: Two times a day (BID) | ORAL | Status: DC

## 2014-02-15 MED ORDER — INSULIN DETEMIR 100 UNIT/ML ~~LOC~~ SOLN
10.0000 [IU] | Freq: Every day | SUBCUTANEOUS | Status: DC
  Administered 2014-02-15: 10 [IU] via SUBCUTANEOUS
  Filled 2014-02-15 (×2): qty 0.1

## 2014-02-15 MED ORDER — WARFARIN SODIUM 5 MG PO TABS
5.0000 mg | ORAL_TABLET | Freq: Once | ORAL | Status: AC
  Administered 2014-02-15: 5 mg via ORAL
  Filled 2014-02-15: qty 1

## 2014-02-15 NOTE — Progress Notes (Signed)
Inpatient Diabetes Program Recommendations  AACE/ADA: New Consensus Statement on Inpatient Glycemic Control (2013)  Target Ranges:  Prepandial:   less than 140 mg/dL      Peak postprandial:   less than 180 mg/dL (1-2 hours)      Critically ill patients:  140 - 180 mg/dL   Reason for Assessment:  Note patient transitioning off insulin drip today post-op. Results for TRENESHA, ALCAIDE (MRN [id number]) as of 02/15/2014 10:02  Ref. Range 02/10/2014 14:03  Hgb A1c MFr Bld Latest Range: 4.8-5.6 % 10.3 (H)   Based on insulin drip rates over night and patient's weight, may consider increasing Levemir to 30 units daily and adding Novolog meal coverage 4 units tid with meals (Hold if patient eats less than 50%).   Will talk with patient regarding A1C.  Note that history indicates pre-diabetes, however A1c is markedly elevated.   Thanks, Adah Perl, RN, BC-ADM Inpatient Diabetes Coordinator Pager [phone]

## 2014-02-15 NOTE — Progress Notes (Addendum)
TCTS DAILY ICU PROGRESS NOTE                   [address]            [place] [address]          [phone]   1 Day Post-Op Procedure(s) (LRB): AORTIC VALVE REPLACEMENT (AVR) (N/A) INTRAOPERATIVE TRANSESOPHAGEAL ECHOCARDIOGRAM (N/A)  Total Length of Stay:  LOS: 1 day   Subjective: Patient awake, alert, has incisional pain.  Objective: Vital signs in last 24 hours: Temp:  [95.9 F (35.5 C)-99.3 F (37.4 C)] 99 F (37.2 C) (02/02 0700) Pulse Rate:  [78-108] 108 (02/02 0700) Cardiac Rhythm:  [-] Sinus tachycardia (02/02 0400) Resp:  [9-36] 20 (02/02 0700) BP: (64-112)/(39-65) 80/39 mmHg (02/01 2000) SpO2:  [96 %-100 %] 96 % (02/02 0700) Arterial Line BP: (86-145)/(49-73) 123/54 mmHg (02/02 0700) FiO2 (%):  [40 %-50 %] 40 % (02/01 1615) Weight:  [221 lb 1.9 oz (100.3 kg)-235 lb 14.3 oz (107 kg)] 235 lb 14.3 oz (107 kg) (02/02 0500)  Filed Weights   02/14/14 1245 02/15/14 0500  Weight: 221 lb 1.9 oz (100.3 kg) 235 lb 14.3 oz (107 kg)    Weight change:    Hemodynamic parameters for last 24 hours: PAP: (14-36)/(7-23) 24/13 mmHg CO:  [3.3 L/min-5.4 L/min] 5.1 L/min CI:  [1.6 L/min/m2-2.5 L/min/m2] 2.4 L/min/m2  Intake/Output from previous day: 02/01 0701 - 02/02 0700 In: 6090 [P.O.:180; I.V.:4018; Blood:612; NG/GT:30; IV Piggyback:1250] Out: 8938 [Urine:3530; Blood:500; Chest Tube:400]  Intake/Output this shift:    Current Meds: Scheduled Meds: . acetaminophen  1,000 mg Oral 4 times per day   Or  . acetaminophen (TYLENOL) oral liquid 160 mg/5 mL  1,000 mg Per Tube 4 times per day  . antiseptic oral rinse  7 mL Mouth Rinse BID  . aspirin EC  325 mg Oral Daily   Or  . aspirin  324 mg Per Tube Daily  . bisacodyl  10 mg Oral Daily   Or  . bisacodyl  10 mg Rectal Daily  . cefUROXime (ZINACEF)  IV  1.5 g Intravenous Q12H  . Chlorhexidine Gluconate Cloth  6 each Topical Daily  . docusate sodium  200 mg Oral Daily  . famotidine (PEPCID) IV  20 mg  Intravenous Q12H  . insulin regular  0-10 Units Intravenous TID WC  . metoprolol tartrate  12.5 mg Oral BID   Or  . metoprolol tartrate  12.5 mg Per Tube BID  . mupirocin ointment  1 application Nasal BID  . [START ON 02/16/2014] pantoprazole  40 mg Oral Daily  . [START ON 02/18/2014] pneumococcal 23 valent vaccine  0.5 mL Intramuscular Tomorrow-1000  . sodium chloride  10-40 mL Intracatheter Q12H  . sodium chloride  3 mL Intravenous Q12H   Continuous Infusions: . sodium chloride 20 mL/hr at 02/15/14 0400  . sodium chloride    . sodium chloride 20 mL/hr at 02/14/14 1300  . dexmedetomidine Stopped (02/14/14 1900)  . insulin (NOVOLIN-R) infusion 11.3 Units/hr (02/15/14 0700)  . lactated ringers 20 mL/hr at 02/15/14 0400  . nitroGLYCERIN 83.333 mcg/min (02/15/14 0600)  . phenylephrine (NEO-SYNEPHRINE) Adult infusion Stopped (02/15/14 0200)   PRN Meds:.albumin human, metoprolol, midazolam, morphine injection, ondansetron (ZOFRAN) IV, oxyCODONE, potassium chloride, sodium chloride, sodium chloride, traMADol  General appearance: alert, cooperative and no distress Neurologic: intact Heart: RRR, rub with CTS in place, sharp valve click Lungs: Diminished at bases Abdomen: soft, non-tender; bowel sounds normal; no masses,  no organomegaly Extremities: SCDs in place Wound: Dressing is clean, dry, intact  Lab Results: CBC: Recent Labs  02/14/14 1800 02/14/14 1806 02/15/14 0302  WBC 14.4*  --  15.0*  HGB 11.0* 11.6* 10.7*  HCT 32.2* 34.0* 31.8*  PLT 188  --  167   BMET:  Recent Labs  02/14/14 1806 02/15/14 0302  NA 142 136  K 3.8 3.6  CL 107 109  CO2  --  24  GLUCOSE 154* 169*  BUN 7 7  CREATININE 0.40* 0.53  CALCIUM  --  7.3*    PT/INR:  Recent Labs  02/14/14 1255  LABPROT 17.0*  INR 1.37   Radiology: Dg Chest Port 1 View  02/14/2014   CLINICAL DATA:  Status post aortic valve replacement today for aortic stenosis secondary to a bicuspid aortic valve.  EXAM: PORTABLE  CHEST - 1 VIEW  COMPARISON:  PA and lateral chest 02/10/2014.  CT chest 02/02/2014.  FINDINGS: Endotracheal tube is in place with the tip in good position at the level of the clavicular heads. Tip of right IJ approach Swan-Ganz catheter is in the right main pulmonary artery. NG tube courses into the stomach and below the inferior margin of the film. Chest tubes are in place. Lung volumes are low but the lungs are clear. No pneumothorax or pleural effusion.  IMPRESSION: Support apparatus a projects in good position. Negative for pneumothorax or other acute abnormality.   Electronically Signed   By: Inge Rise M.D.   On: 02/14/2014 13:01    Assessment/Plan: S/P Procedure(s) (LRB): AORTIC VALVE REPLACEMENT (AVR) (N/A) INTRAOPERATIVE TRANSESOPHAGEAL ECHOCARDIOGRAM (N/A)  1.CV-Previously AAI paced. Not given Lopressor yesterday. ST, PVCs, and hypertensive this am. Lopressor 12.5 given. Will increase to 25 bid. On Nitro drip as well-wean as tolerates. Was on Zestoretic pre op. Will start Lisinopril component if remains hypertensive. Will start Coumadin tonight as has St. Jude mechanical valve and monitor daily PT/INR. 2.Pulmonary-Chest tubes with 400 cc since surgery. May be able to remove later today if output decreases;if not CTs to remain until am.CXR shows no pneumothorax, cardiomegaly, and low lung volumes. Encourage incentive spirometer and flutter valve. 3.Volume overload-will give Lasix 40 IV today 4.ABL anemia-H and H 10.7 and 31.8 5. Supplement potassium 6. Regarding pain control, taking a lot of Morphine. Will give several doses of Toradol 7.DM-CBGs 149/177/175. Pre op HGA1C 10.3. Will give Levemir to stay off Insulin drip. Will start oral agent once tolerating diet better. Will need to follow up with medical doctor after discharge. 8. Please see progression orders    Arnoldo Lenis 02/15/2014 7:39 AM   Chart reviewed, patient examined, agree with above. She has been very  stable. Will start coumadin tonight.  She has newly diagnosed DM and will need to get started on an oral agent with outpatient education and followup. Her brother is diabetic.

## 2014-02-15 NOTE — Progress Notes (Signed)
Patient ID: Anna Salinas, female   DOB: 14-May-1970, 44 y.o.   MRN: [id number]  SICU Evening Rounds:  Hemodynamically stable but still on neo Sinus rhythm Urine output adequate Labs pending this afternoon.  Awake and alert. No complaints.

## 2014-02-16 ENCOUNTER — Inpatient Hospital Stay (HOSPITAL_COMMUNITY): Payer: BLUE CROSS/BLUE SHIELD

## 2014-02-16 LAB — GLUCOSE, CAPILLARY
GLUCOSE-CAPILLARY: 171 mg/dL — AB (ref 70–99)
GLUCOSE-CAPILLARY: 167 mg/dL — AB (ref 70–99)
Glucose-Capillary: 189 mg/dL — ABNORMAL HIGH (ref 70–99)
Glucose-Capillary: 211 mg/dL — ABNORMAL HIGH (ref 70–99)
Glucose-Capillary: 178 mg/dL — ABNORMAL HIGH (ref 70–99)

## 2014-02-16 LAB — BASIC METABOLIC PANEL
Anion gap: 3 — ABNORMAL LOW (ref 5–15)
BUN: 8 mg/dL (ref 6–23)
CO2: 28 mmol/L (ref 19–32)
Calcium: 7.7 mg/dL — ABNORMAL LOW (ref 8.4–10.5)
Chloride: 105 mmol/L (ref 96–112)
Creatinine, Ser: 0.63 mg/dL (ref 0.50–1.10)
GFR calc Af Amer: >90 mL/min (ref >90)
Glucose, Bld: 198 mg/dL — ABNORMAL HIGH (ref 70–99)
Potassium: 3.8 mmol/L (ref 3.5–5.1)
SODIUM: 136 mmol/L (ref 135–145)

## 2014-02-16 LAB — CBC
HCT: 29.4 % — ABNORMAL LOW (ref 36.0–46.0)
HEMOGLOBIN: 9.7 g/dL — AB (ref 12.0–15.0)
MCH: 29.2 pg (ref 26.0–34.0)
MCHC: 33 g/dL (ref 30.0–36.0)
MCV: 88.6 fL (ref 78.0–100.0)
PLATELETS: 121 10*3/uL — AB (ref 150–400)
RBC: 3.32 MIL/uL — ABNORMAL LOW (ref 3.87–5.11)
RDW: 14.2 % (ref 11.5–15.5)
WBC: 9.9 10*3/uL (ref 4.0–10.5)

## 2014-02-16 LAB — PROTIME-INR
INR: 1.4 (ref 0.00–1.49)
Prothrombin Time: 17.3 seconds — ABNORMAL HIGH (ref 11.6–15.2)

## 2014-02-16 MED ORDER — LIVING WELL WITH DIABETES BOOK
Freq: Once | Status: AC
  Administered 2014-02-16: 17:00:00
  Filled 2014-02-16: qty 1

## 2014-02-16 MED ORDER — PATIENT'S GUIDE TO USING COUMADIN BOOK
Freq: Once | Status: AC
  Administered 2014-02-16: 17:00:00
  Filled 2014-02-16: qty 1

## 2014-02-16 MED ORDER — POTASSIUM CHLORIDE CRYS ER 20 MEQ PO TBCR
20.0000 meq | EXTENDED_RELEASE_TABLET | Freq: Every day | ORAL | Status: DC
  Filled 2014-02-16: qty 1

## 2014-02-16 MED ORDER — SODIUM CHLORIDE 0.9 % IJ SOLN
3.0000 mL | Freq: Two times a day (BID) | INTRAMUSCULAR | Status: DC
  Administered 2014-02-16 – 2014-02-17 (×3): 3 mL via INTRAVENOUS

## 2014-02-16 MED ORDER — WARFARIN VIDEO
Freq: Once | Status: AC
  Administered 2014-02-16: 17:00:00

## 2014-02-16 MED ORDER — SODIUM CHLORIDE 0.9 % IV SOLN: 250.0000 mL | INTRAVENOUS | Status: DC | PRN

## 2014-02-16 MED ORDER — SODIUM CHLORIDE 0.9 % IJ SOLN: 3.0000 mL | INTRAMUSCULAR | Status: DC | PRN

## 2014-02-16 MED ORDER — WARFARIN SODIUM 5 MG PO TABS
5.0000 mg | ORAL_TABLET | Freq: Every day | ORAL | Status: DC
  Administered 2014-02-16 – 2014-02-17 (×2): 5 mg via ORAL
  Filled 2014-02-16 (×4): qty 1

## 2014-02-16 MED ORDER — METOPROLOL TARTRATE 25 MG PO TABS
25.0000 mg | ORAL_TABLET | Freq: Two times a day (BID) | ORAL | Status: DC
  Administered 2014-02-16: 25 mg via ORAL
  Filled 2014-02-16 (×2): qty 1

## 2014-02-16 MED ORDER — MOVING RIGHT ALONG BOOK
Freq: Once | Status: DC
  Filled 2014-02-16: qty 1

## 2014-02-16 MED ORDER — METOPROLOL TARTRATE 25 MG PO TABS
25.0000 mg | ORAL_TABLET | Freq: Two times a day (BID) | ORAL | Status: DC
  Administered 2014-02-16 – 2014-02-18 (×4): 25 mg via ORAL
  Filled 2014-02-16 (×5): qty 1

## 2014-02-16 MED ORDER — INSULIN ASPART 100 UNIT/ML ~~LOC~~ SOLN
0.0000 [IU] | Freq: Three times a day (TID) | SUBCUTANEOUS | Status: DC
  Administered 2014-02-16 (×2): 4 [IU] via SUBCUTANEOUS
  Administered 2014-02-16 – 2014-02-17 (×2): 8 [IU] via SUBCUTANEOUS
  Administered 2014-02-17 (×2): 4 [IU] via SUBCUTANEOUS
  Administered 2014-02-17: 8 [IU] via SUBCUTANEOUS
  Administered 2014-02-18: 2 [IU] via SUBCUTANEOUS

## 2014-02-16 MED ORDER — FUROSEMIDE 40 MG PO TABS
40.0000 mg | ORAL_TABLET | Freq: Every day | ORAL | Status: DC
  Administered 2014-02-17 – 2014-02-18 (×2): 40 mg via ORAL
  Filled 2014-02-16 (×2): qty 1

## 2014-02-16 MED ORDER — POTASSIUM CHLORIDE CRYS ER 20 MEQ PO TBCR
40.0000 meq | EXTENDED_RELEASE_TABLET | Freq: Once | ORAL | Status: AC
  Administered 2014-02-16: 40 meq via ORAL
  Filled 2014-02-16: qty 2

## 2014-02-16 MED ORDER — LINAGLIPTIN 5 MG PO TABS
5.0000 mg | ORAL_TABLET | Freq: Every day | ORAL | Status: DC
  Administered 2014-02-16 – 2014-02-18 (×3): 5 mg via ORAL
  Filled 2014-02-16 (×3): qty 1

## 2014-02-16 MED ORDER — FUROSEMIDE 40 MG PO TABS: 40.0000 mg | ORAL_TABLET | Freq: Every day | ORAL | Status: DC

## 2014-02-16 MED ORDER — FUROSEMIDE 10 MG/ML IJ SOLN
40.0000 mg | Freq: Once | INTRAMUSCULAR | Status: AC
  Administered 2014-02-16: 40 mg via INTRAVENOUS
  Filled 2014-02-16: qty 4

## 2014-02-16 MED FILL — Potassium Chloride Inj 2 mEq/ML: INTRAVENOUS | Qty: 40 | Status: AC

## 2014-02-16 MED FILL — Heparin Sodium (Porcine) Inj 1000 Unit/ML: INTRAMUSCULAR | Qty: 30 | Status: AC

## 2014-02-16 MED FILL — Magnesium Sulfate Inj 50%: INTRAMUSCULAR | Qty: 10 | Status: AC

## 2014-02-16 NOTE — Progress Notes (Signed)
Report called to [place], 2W-RN. VS stable at time of transfer. Pt to transfer to 2W-39 via ambulation. Family and belongings at bedside. Meds in chart. No current questions or complaints at this time. Bedside handoff to [place], [place]. Anna Salinas

## 2014-02-16 NOTE — Progress Notes (Signed)
Spoke briefly with patient regarding new onset diabetes.  She states that she was told that she had "Borderline" diabetes and has been on medications in the past.  She states that the metformin upset her stomach.  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  Discussed results of A1C=10.3%.  She states,  "they told me".  She seemed very interested in learning about eating and caring for her diabetes.  Will order outpatient diabetes education per protocol.  Patient will need meter, strips, and Rx. For diabetes medications at discharge.  States she has a f/u appt. With PCP on February 15.  Will order basic diabetes education by bedside RN including videos, booklet, and diet education.  Will follow-up with patient on 2/4 to answer further questions.  Adah Perl, RN, BC-ADM Inpatient Diabetes Coordinator Pager [phone]

## 2014-02-16 NOTE — Discharge Instructions (Addendum)
Information on my medicine - Coumadin®   (Warfarin) ° °This medication education was reviewed with me or my healthcare representative as part of my discharge preparation. ° °Why was Coumadin prescribed for you? °Coumadin was prescribed for you because you have a blood clot or a medical condition that can cause an increased risk of forming blood clots. Blood clots can cause serious health problems by blocking the flow of blood to the heart, lung, or brain. Coumadin can prevent harmful blood clots from forming. °As a reminder your indication for Coumadin is:   Blood Clot Prevention After Heart Valve Surgery ° °What test will check on my response to Coumadin? °While on Coumadin (warfarin) you will need to have an INR test regularly to ensure that your dose is keeping you in the desired range. The INR (international normalized ratio) number is calculated from the result of the laboratory test called prothrombin time (PT). ° °If an INR APPOINTMENT HAS NOT ALREADY BEEN MADE FOR YOU please schedule an appointment to have this lab work done by your health care provider within 7 days. °Your INR goal is usually a number between:  2 to 3 or your provider may give you a more narrow range like 2-2.5.  Ask your health care provider during an office visit what your goal INR is. ° °What  do you need to  know  About  COUMADIN? °Take Coumadin (warfarin) exactly as prescribed by your healthcare provider about the same time each day.  DO NOT stop taking without talking to the doctor who prescribed the medication.  Stopping without other blood clot prevention medication to take the place of Coumadin may increase your risk of developing a new clot or stroke.  Get refills before you run out. ° °What do you do if you miss a dose? °If you miss a dose, take it as soon as you remember on the same day then continue your regularly scheduled regimen the next day.  Do not take two doses of Coumadin at the same time. ° °Important Safety  Information °A possible side effect of Coumadin (Warfarin) is an increased risk of bleeding. You should call your healthcare provider right away if you experience any of the following: °? Bleeding from an injury or your nose that does not stop. °? Unusual colored urine (red or dark brown) or unusual colored stools (red or black). °? Unusual bruising for unknown reasons. °? A serious fall or if you hit your head (even if there is no bleeding). ° °Some foods or medicines interact with Coumadin® (warfarin) and might alter your response to warfarin. To help avoid this: °? Eat a balanced diet, maintaining a consistent amount of Vitamin K. °? Notify your provider about major diet changes you plan to make. °? Avoid alcohol or limit your intake to 1 drink for women and 2 drinks for men per day. °(1 drink is 5 oz. wine, 12 oz. beer, or 1.5 oz. liquor.) ° °Make sure that ANY health care provider who prescribes medication for you knows that you are taking Coumadin (warfarin).  Also make sure the healthcare provider who is monitoring your Coumadin knows when you have started a new medication including herbals and non-prescription products. ° °Coumadin® (Warfarin)  Major Drug Interactions  °Increased Warfarin Effect Decreased Warfarin Effect  °Alcohol (large quantities) °Antibiotics (esp. Septra/Bactrim, Flagyl, Cipro) °Amiodarone (Cordarone) °Aspirin (ASA) °Cimetidine (Tagamet) °Megestrol (Megace) °NSAIDs (ibuprofen, naproxen, etc.) °Piroxicam (Feldene) °Propafenone (Rythmol SR) °Propranolol (Inderal) °Isoniazid (INH) °Posaconazole (Noxafil) Barbiturates (Phenobarbital) °Carbamazepine (  Phenobarbital) Carbamazepine (Tegretol) Chlordiazepoxide (Librium) Cholestyramine (Questran) Griseofulvin Oral Contraceptives Rifampin Sucralfate (Carafate) Vitamin K   Coumadin (Warfarin) Major Herbal Interactions  Increased Warfarin Effect Decreased Warfarin Effect  Garlic Ginseng Ginkgo biloba Coenzyme Q10 Green tea St. Johns wort    Coumadin (Warfarin)  FOOD Interactions  Eat a consistent number of servings per week of foods HIGH in Vitamin K (1 serving =  cup)  Collards (cooked, or boiled & drained) Kale (cooked, or boiled & drained) Mustard greens (cooked, or boiled & drained) Parsley *serving size only =  cup Spinach (cooked, or boiled & drained) Swiss chard (cooked, or boiled & drained) Turnip greens (cooked, or boiled & drained)  Eat a consistent number of servings per week of foods MEDIUM-HIGH in Vitamin K (1 serving = 1 cup)  Asparagus (cooked, or boiled & drained) Broccoli (cooked, boiled & drained, or raw & chopped) Brussel sprouts (cooked, or boiled & drained) *serving size only =  cup Lettuce, raw (green leaf, endive, romaine) Spinach, raw Turnip greens, raw & chopped   These websites have more information on Coumadin (warfarin):  FailFactory.se; VeganReport.com.au;   Activity: 1.May walk up steps                2.No lifting more than ten pounds for four weeks.                 3.No driving for four weeks.                4.Stop any activity that causes chest pain, shortness of breath, dizziness,                            sweating or excessive weakness.                5.Avoid straining.                6.Continue with your breathing exercises daily.  Diet: Diabetic diet and Low fat, Low salt diet  Wound Care: May shower.  Clean wounds with mild soap and water daily. Contact the office at [phone] if any problems arise.  Aortic Valve Replacement, Care After Refer to this sheet in the next few weeks. These instructions provide you with information on caring for yourself after your procedure. Your health care provider may also give you specific instructions. Your treatment has been planned according to current medical practices, but problems sometimes occur. Call your health care provider if you have any problems or questions after your procedure. HOME CARE INSTRUCTIONS   Take medicines only as  directed by your health care provider.  If your health care provider has prescribed elastic stockings, wear them as directed.  Take frequent naps or rest often throughout the day.  Avoid lifting over 10 lbs (4.5 kg) or pushing or pulling things with your arms for 6-8 weeks or as directed by your health care provider.  Avoid driving or airplane travel for 4-6 weeks after surgery or as directed by your health care provider. If you are riding in a car for an extended period, stop every 1-2 hours to stretch your legs. Keep a record of your medicines and medical history with you when traveling.  Do not drive or operate heavy machinery while taking pain medicine. (narcotics).  Do not cross your legs.  Do not use any tobacco products including cigarettes, chewing tobacco, or electronic cigarettes. If you need help quitting, ask your health care provider.  Do not take baths, swim, or use a hot tub until your health care provider approves. Take showers once your health care provider approves. Pat incisions dry. Do not rub incisions with a washcloth or towel.  Avoid climbing stairs and using the handrail to pull yourself up for the first 2-3 weeks after surgery.  Return to work as directed by your health care provider.  Drink enough fluid to keep your urine clear or pale yellow.  Do not strain to have a bowel movement. Eat high-fiber foods if you become constipated. You may also take a medicine to help you have a bowel movement (laxative) as directed by your health care provider.  Resume sexual activity as directed by your health care provider. Men should not use medicines for erectile dysfunction until their doctor says it isokay.  If you had a certain type of heart condition in the past, you may need to take antibiotic medicine before having dental work or surgery. Let your dentist and health care providers know if you had one or more of the following:  Previous endocarditis.  An artificial  (prosthetic) heart valve.  Congenital heart disease. SEEK MEDICAL CARE IF:  You develop a skin rash.   You experience sudden changes in your weight.  You have a fever. SEEK IMMEDIATE MEDICAL CARE IF:   You develop chest pain that is not coming from your incision.  You have drainage (pus), redness, swelling, or pain at your incision site.   You develop shortness of breath or have difficulty breathing.   You have increased bleeding from your incision site.   You develop light-headedness.  MAKE SURE YOU:   Understand these directions.  Will watch your condition.  Will get help right away if you are not doing well or get worse. Document Released: 07/19/2004 Document Revised: 05/17/2013 Document Reviewed: 10/15/2011 [facility] Patient Information 2015 [place], [place]. This information is not intended to replace advice given to you by your health care provider. Make sure you discuss any questions you have with your health care provider.

## 2014-02-16 NOTE — Progress Notes (Addendum)
TCTS DAILY ICU PROGRESS NOTE                   [address]            [place] [address]          [phone]   2 Days Post-Op Procedure(s) (LRB): AORTIC VALVE REPLACEMENT (AVR) (N/A) INTRAOPERATIVE TRANSESOPHAGEAL ECHOCARDIOGRAM (N/A)  Total Length of Stay:  LOS: 2 days   Subjective: Patient awake, alert. Toradol helped with incisional pain.  Objective: Vital signs in last 24 hours: Temp:  [98.1 F (36.7 C)-99.3 F (37.4 C)] 98.9 F (37.2 C) (02/03 0726) Pulse Rate:  [85-110] 104 (02/03 0700) Cardiac Rhythm:  [-] Normal sinus rhythm (02/03 0400) Resp:  [13-24] 24 (02/03 0700) BP: (90-121)/(27-74) 121/74 mmHg (02/03 0700) SpO2:  [92 %-100 %] 95 % (02/03 0700) Arterial Line BP: (91-136)/(41-57) 97/54 mmHg (02/02 1300) Weight:  [236 lb 8.9 oz (107.3 kg)] 236 lb 8.9 oz (107.3 kg) (02/03 0500)  Filed Weights   02/14/14 1245 02/15/14 0500 02/16/14 0500  Weight: 221 lb 1.9 oz (100.3 kg) 235 lb 14.3 oz (107 kg) 236 lb 8.9 oz (107.3 kg)    Weight change: 15 lb 6.9 oz (7 kg)   Hemodynamic parameters for last 24 hours: PAP: (22-23)/(12-13) 23/12 mmHg CO:  [5.1 L/min] 5.1 L/min CI:  [2.4 L/min/m2] 2.4 L/min/m2  Intake/Output from previous day: 02/02 0701 - 02/03 0700 In: 2244.9 [P.O.:720; I.V.:1424.9; IV Piggyback:100] Out: 6546 [Urine:1295; Chest Tube:240]     Current Meds: Scheduled Meds: . acetaminophen  1,000 mg Oral 4 times per day   Or  . acetaminophen (TYLENOL) oral liquid 160 mg/5 mL  1,000 mg Per Tube 4 times per day  . antiseptic oral rinse  7 mL Mouth Rinse BID  . bisacodyl  10 mg Oral Daily   Or  . bisacodyl  10 mg Rectal Daily  . cefUROXime (ZINACEF)  IV  1.5 g Intravenous Q12H  . Chlorhexidine Gluconate Cloth  6 each Topical Daily  . docusate sodium  200 mg Oral Daily  . insulin aspart  0-24 Units Subcutaneous 6 times per day  . insulin detemir  10 Units Subcutaneous Daily  . insulin regular  0-10 Units Intravenous TID WC  .  mupirocin ointment  1 application Nasal BID  . pantoprazole  40 mg Oral Daily  . [START ON 02/18/2014] pneumococcal 23 valent vaccine  0.5 mL Intramuscular Tomorrow-1000  . sodium chloride  10-40 mL Intracatheter Q12H  . sodium chloride  3 mL Intravenous Q12H  . Warfarin - Physician Dosing Inpatient   Does not apply q1800   Continuous Infusions: . sodium chloride 20 mL/hr at 02/16/14 0400  . sodium chloride    . sodium chloride 20 mL/hr at 02/15/14 1800  . insulin (NOVOLIN-R) infusion Stopped (02/15/14 1400)  . lactated ringers 20 mL/hr at 02/15/14 1800  . phenylephrine (NEO-SYNEPHRINE) Adult infusion Stopped (02/16/14 0400)   PRN Meds:.morphine injection, ondansetron (ZOFRAN) IV, oxyCODONE, sodium chloride, sodium chloride, traMADol  General appearance: alert, cooperative and no distress Neurologic: intact Heart: RRR, rub with CTS in place, sharp valve click Lungs: Diminished at bases Abdomen: soft, non-tender; bowel sounds normal; no masses,  no organomegaly Extremities: SCDs in place Wound: Dressing is clean, dry, intact  Lab Results: CBC:  Recent Labs  02/15/14 1700 02/15/14 1715 02/16/14 0400  WBC 12.6*  --  9.9  HGB 9.8* 9.9* 9.7*  HCT 29.3* 29.0* 29.4*  PLT 130*  --  121*   BMET:  Recent Labs  02/15/14 0302  02/15/14 1715 02/16/14 0400  NA 136  --  138 136  K 3.6  --  4.1 3.8  CL 109  --  102 105  CO2 24  --   --  28  GLUCOSE 169*  --  180* 198*  BUN 7  --  7 8  CREATININE 0.53  < > 0.60 0.63  CALCIUM 7.3*  --   --  7.7*  < > = values in this interval not displayed.  PT/INR:   Recent Labs  02/16/14 0400  LABPROT 17.3*  INR 1.40   Radiology: Dg Chest Port 1 View  02/15/2014   CLINICAL DATA:  Aortic valve replacement.  EXAM: PORTABLE CHEST - 1 VIEW  COMPARISON:  02/14/2014.  FINDINGS: Interim extubation and removal of NG tube. Swan-Ganz catheter tip in the left main pulmonary artery. Mediastinal and pericardial drainage catheters in stable position.  Stable cardiomegaly. Aortic valve replacement . Low lung volumes with basilar atelectasis. No pleural effusion or pneumothorax. Mild gastric distention.  IMPRESSION: 1. Interim extubation and removal NG tube. 2. Swan-Ganz catheter tip noted in the left main pulmonary artery. Mediastinal and pericardial drainage catheters in stable position. 3. Stable cardiomegaly.  Prior aortic valve replacement. 4. Low lung volumes with mild bibasilar atelectasis. 5. Mild gastric distention.   Electronically Signed   By: Marcello Moores  Register   On: 02/15/2014 07:43    Assessment/Plan: S/P Procedure(s) (LRB): AORTIC VALVE REPLACEMENT (AVR) (N/A) INTRAOPERATIVE TRANSESOPHAGEAL ECHOCARDIOGRAM (N/A)  1.CV- ST this am. On Lopressor 25 bid.  Continue Coumadin 5 mg tonight as has St. Jude mechanical valve. INR 1.4 this am. 2.Pulmonary-On room air. Chest tubes with 250 cc last 24 hours (90 cc last 12 hours). Remove chest tubes.CXR is stable. Encourage incentive spirometer and flutter valve. 3.Volume overload-will give Lasix 40 IV today and start oral in am 4.ABL anemia-H and H 9.7 and 29.4 5. Supplement potassium 6. Regarding pain control,Toradol helped a lot. 7.DM-CBGs 190/191/171. Pre op HGA1C 10.3. On Levemir to stay off Insulin drip. Patient states she cannot tolerate Glipizide or Metformin. Will start Januvia (substitured by Tradjenta here) and see if she can tolerate this. She will need close medical follow up after discharge. 8. Mild thrombocytopenia-platelets 121,000 9. Remove foley 10. Transfer to [place] PA-C 02/16/2014 7:43 AM   Chart reviewed, patient examined, agree with above. She feels well and is ready to go to 2W. Weight is 15 lbs over preop. Start diuresis today.

## 2014-02-17 ENCOUNTER — Inpatient Hospital Stay (HOSPITAL_COMMUNITY): Payer: BLUE CROSS/BLUE SHIELD

## 2014-02-17 DIAGNOSIS — Z736 Limitation of activities due to disability: Secondary | ICD-10-CM

## 2014-02-17 LAB — CBC
HCT: 29.1 % — ABNORMAL LOW (ref 36.0–46.0)
Hemoglobin: 9.5 g/dL — ABNORMAL LOW (ref 12.0–15.0)
MCH: 29.2 pg (ref 26.0–34.0)
MCHC: 32.6 g/dL (ref 30.0–36.0)
MCV: 89.5 fL (ref 78.0–100.0)
Platelets: 135 10*3/uL — ABNORMAL LOW (ref 150–400)
RBC: 3.25 MIL/uL — ABNORMAL LOW (ref 3.87–5.11)
RDW: 14.2 % (ref 11.5–15.5)
WBC: 8.7 10*3/uL (ref 4.0–10.5)

## 2014-02-17 LAB — PROTIME-INR
INR: 1.66 — AB (ref 0.00–1.49)
Prothrombin Time: 19.7 seconds — ABNORMAL HIGH (ref 11.6–15.2)

## 2014-02-17 LAB — BASIC METABOLIC PANEL
Anion gap: 5 (ref 5–15)
BUN: 10 mg/dL (ref 6–23)
CALCIUM: 7.9 mg/dL — AB (ref 8.4–10.5)
CO2: 24 mmol/L (ref 19–32)
Chloride: 107 mmol/L (ref 96–112)
Creatinine, Ser: 0.53 mg/dL (ref 0.50–1.10)
GFR calc Af Amer: >90 mL/min (ref >90)
Glucose, Bld: 168 mg/dL — ABNORMAL HIGH (ref 70–99)
POTASSIUM: 3.6 mmol/L (ref 3.5–5.1)
SODIUM: 136 mmol/L (ref 135–145)

## 2014-02-17 LAB — GLUCOSE, CAPILLARY
GLUCOSE-CAPILLARY: 221 mg/dL — AB (ref 70–99)
Glucose-Capillary: 192 mg/dL — ABNORMAL HIGH (ref 70–99)
Glucose-Capillary: 164 mg/dL — ABNORMAL HIGH (ref 70–99)
Glucose-Capillary: 211 mg/dL — ABNORMAL HIGH (ref 70–99)

## 2014-02-17 MED ORDER — POTASSIUM CHLORIDE CRYS ER 20 MEQ PO TBCR
40.0000 meq | EXTENDED_RELEASE_TABLET | Freq: Every day | ORAL | Status: DC
  Administered 2014-02-17 – 2014-02-18 (×2): 40 meq via ORAL
  Filled 2014-02-17 (×2): qty 2

## 2014-02-17 NOTE — Progress Notes (Addendum)
       [address]       [place] [address]             [phone]          3 Days Post-Op Procedure(s) (LRB): AORTIC VALVE REPLACEMENT (AVR) (N/A) INTRAOPERATIVE TRANSESOPHAGEAL ECHOCARDIOGRAM (N/A)  Subjective: Feels well, no complaints. Appetite good, +BM.  Walking in halls without difficulty.   Objective: Vital signs in last 24 hours: Patient Vitals for the past 24 hrs:  BP Temp Temp src Pulse Resp SpO2 Weight  02/17/14 0424 (!) 97/58 mmHg 98.8 F (37.1 C) Oral 96 18 99 % 233 lb 11 oz (106 kg)  02/16/14 2000 107/62 mmHg 99 F (37.2 C) Oral 94 18 98 % -  02/16/14 1300 108/65 mmHg 97.8 F (36.6 C) Oral 75 20 96 % -  02/16/14 1200 108/65 mmHg - - - - - -  02/16/14 1100 113/71 mmHg - - (!) 101 (!) 22 95 % -  02/16/14 1000 126/83 mmHg - - (!) 108 (!) 23 96 % -  02/16/14 0900 127/70 mmHg - - (!) 101 18 96 % -  02/16/14 0800 125/72 mmHg - - (!) 104 18 97 % -   Current Weight  02/17/14 233 lb 11 oz (106 kg)   PRE OP WEIGHT: 100 kg   Intake/Output from previous day: 02/03 0701 - 02/04 0700 In: 850 [P.O.:720; I.V.:80; IV Piggyback:50] Out: 1480 [Urine:1460; Chest Tube:20]  CBGs [id number]   PHYSICAL EXAM:  Heart: RRR Lungs: Clear Wound: Clean and dry Extremities: Mild LE edema    Lab Results: CBC: Recent Labs  02/16/14 0400 02/17/14 0402  WBC 9.9 8.7  HGB 9.7* 9.5*  HCT 29.4* 29.1*  PLT 121* 135*   BMET:  Recent Labs  02/16/14 0400 02/17/14 0402  NA 136 136  K 3.8 3.6  CL 105 107  CO2 28 24  GLUCOSE 198* 168*  BUN 8 10  CREATININE 0.63 0.53  CALCIUM 7.7* 7.9*    PT/INR:  Recent Labs  02/17/14 0402  LABPROT 19.7*  INR 1.66*      Assessment/Plan: S/P Procedure(s) (LRB): AORTIC VALVE REPLACEMENT (AVR) (N/A) INTRAOPERATIVE TRANSESOPHAGEAL ECHOCARDIOGRAM (N/A)  CV- Mildly tachy, BPs stable. Continue current Lopressor dose, Coumadin load. She had a run of ST/VT on telemetry overnight, asymptomatic.  Review of  tele reveals other runs which appear to be artifact.  She has mild hypokalemia, so will replace.  Continue to monitor.  Vol overload- continue diuresis, resume po Lasix.   DM- CBGs generally stable. Continue Trajenta (pt intolerant of Glipizide and Metformin). A1C=10.3  CRPI, pulm toilet.  Hopefully home in 1-2 days if she remains stable.   LOS: 3 days    Lace Chenevert H 02/17/2014

## 2014-02-17 NOTE — Discharge Summary (Signed)
Physician Discharge Summary       [address]       [place] [address]             [phone]    Patient ID: Anna Salinas MRN: [id number] DOB/AGE: 07/23/1970 44 y.o.  Admit date: 02/14/2014 Discharge date: 02/18/2014   Admission Diagnoses: 1. Severe aortic stenosis 2. Possible bicuspid aortic valve 3. History of benign, essential hypertension 4. History of pre diabetes   Discharge Diagnoses:  1. Severe aortic stenosis 2. Bicuspid aortic valve 3. History of benign, essential hypertension 4. Diabetes Mellitus (HGA1C 10.3) 5. ABL anemia 6. Mild thrombocytopenia   Procedure (s):  1. Median Sternotomy 2. Extracorporeal circulation 3. Aortic valve replacement using a 21 mm [employer] Mechanical valve by Dr. Cyndia Bent on 02/14/2014   History of Presenting Illness: This is a 44 year old woman with a history of hypertension and known aortic stenosis since she was evaluated for a heart murmur by Dr. Domenic Polite in 2012. The valve was felt to be tri-leaflet with a mean gradient of 15 mm Hg and a peak of 28 mm Hg. An echo on 11/01/2013 showed progression of her aortic stenosis with a mean gradient of 31 mm Hg, peak of 68 mm Hg, and a valve area of 0.95 cm2 by Vmax. The LV function was normal. She does report development of symptoms over the past 6 months with shortness of breath going up stairs or doing more than mild exertion. She has had some dizziness and fatigue as well as occasional chest pressure. Left and right heart pressures were normal. The AV mean gradient was 24 mm Hg with an AVA of 1.0 cm2.   A recent cath showed no coronary disease. The ascending aorta appeared mildly dilated.  CTA of the chest shows no significant enlargement of the aortic root or ascending aorta or arch.  She was seen by her dentist and had one tooth that needed to be extracted preop and this was done.  Dr. Cyndia Bent discussed the need for aortic valve replacement. Potential risks,  benefits, and complications were discussed with the patient and she agreed to proceed with surgery. A discussion was also had regarding the pros and cons of tissue vs mechanical valve. She has children and has had a tubal ligation. She opted for a mechanical valve and compliance with Coumadin. Preoperative carotid duplex US showed no significant carotid artery stenosis bilaterally. She underwent an AVR (using a St. Jude Regent mechanical valve) on 02/14/2014.     Brief Hospital Course:  The patient was extubated the evening of surgery without difficulty. She remained afebrile and hemodynamically stable. She was initially AAI paced and required Neo synephrine. Gordy Councilman, a line, and foley were removed early in the postoperative course.  Chest tubes remained until post operative day 2 and then were removed.  She did have a fair amount of inicisonal pain and Toradol was given, which helped a lot. Neo synephrine drip was stopped.  Lopressor was then started and titrated accordingly.  She was started on Coumadin for her mechanical valve. PT and INR were monitored daily. Last INR was 2.08.  She was volume overloaded and diuresed. She was weaned off the insulin drip.  Initially, Levemir was started.  Once she was tolerating a diet, Tradjenta was started. (She has intolerance to Metformin and allergy to Glipizide.)  The patient's HGA1C pre op was 10.3.  She will need close medical follow up after discharge.  She did  have mild thrombocytopenia.  Platelet count was up to 135,000 on 2/4.  She also had postop anemia. She did not require a transfusion. H and H were 9.5 and 29.1 on 2/4. The patient was felt surgically stable for transfer from the ICU to [facility]U for further convalescence on 02/16/2014. She continues to progress with cardiac rehab. She was ambulating on room air. She has been tolerating a diet and has had a bowel movement.  Epicardial pacing wires will be removed prior to discharge.  Chest tube sutures will be  removed after discharge. The patient has been evaluated and is surgically stable for discharge on 02/18/2014.    Latest Vital Signs: Blood pressure 101/51, pulse 90, temperature 97.8 F (36.6 C), temperature source Oral, resp. rate 18, height 5\' 7"  (1.702 m), weight 228 lb 6.3 oz (103.6 kg), last menstrual period 01/18/2014, SpO2 98 %.  Physical Exam: Heart: RRR Lungs: Clear Wound: Clean and dry Extremities: Mild LE edema    Discharge Condition:Stable  Recent laboratory studies:  Lab Results  Component Value Date   WBC 8.7 02/17/2014   HGB 9.5* 02/17/2014   HCT 29.1* 02/17/2014   MCV 89.5 02/17/2014   PLT 135* 02/17/2014   Lab Results  Component Value Date   NA 136 02/17/2014   K 3.6 02/17/2014   CL 107 02/17/2014   CO2 24 02/17/2014   CREATININE 0.53 02/17/2014   GLUCOSE 168* 02/17/2014      Diagnostic Studies: Dg Chest 2 View  02/17/2014   CLINICAL DATA:  Aortic valve replacement.  Soreness after surgery.  EXAM: CHEST  2 VIEW  COMPARISON:  02/16/2014  FINDINGS: Stable heart size and aortic contours after aortic valve replacement. There is shallow inspiration with bibasilar atelectasis and trace effusions. No pulmonary edema. No pneumothorax. The sternotomy wires are in line and intact.  IMPRESSION: Shallow inspiration with bibasilar atelectasis and small effusions.   Electronically Signed   By: Jorje Guild M.D.   On: 02/17/2014 08:00    Ct Angio Chest Aorta W/cm &/or Wo/cm  02/02/2014   CLINICAL DATA:  Aortic valvular stenosis with dyspnea on exertion and dizziness.  EXAM: CT ANGIOGRAPHY CHEST WITH CONTRAST  TECHNIQUE: Multidetector CT imaging of the chest was performed using the standard protocol during bolus administration of intravenous contrast. Multiplanar CT image reconstructions and MIPs were obtained to evaluate the vascular anatomy.  CONTRAST:  76mL OMNIPAQUE IOHEXOL 350 MG/ML SOLN  COMPARISON:  Chest x-ray on 01/21/2014  FINDINGS: The aortic valve is calcified.  The sinuses of Valsalva appear normal with no evidence of dilatation. The ascending thoracic aorta is mildly prominent in caliber, measuring 3.5- 3.7 cm in greatest diameter compared to caliber of the descending thoracic aorta of 2.0 cm. The proximal arch measures 3.3 cm. The distal arch measures 2.1 cm.  Proximal great vessels show normal branching anatomy and are widely patent. No evidence of aortic dissection.  The heart size is normal. No calcified coronary artery plaque is identified. No pleural or pericardial fluid is seen.  There is no evidence of pulmonary edema, consolidation, pneumothorax, pulmonary nodule or lymphadenopathy. No airway obstruction identified. The thyroid gland appears normal. Visualized upper abdomen shows steatosis of the liver and probable small sliding hiatal hernia. Minimal spondylosis noted of the thoracic spine.  Review of the MIP images confirms the above findings.  IMPRESSION: 1. Calcified aortic valve. The ascending thoracic aorta is mildly prominent in caliber, measuring 3.5- 3.7 cm in greatest diameter. This is likely representative of mild aneurysmal  disease given relative dilatation compared to the diameter of the descending thoracic aorta of 2 cm. 2. Steatosis of the liver.   Electronically Signed   By: Aletta Edouard M.D.   On: 02/02/2014 09:03     Discharge Instructions    Ambulatory referral to Nutrition and Diabetic Education    Complete by:  As directed   New onset:  A1C=10.3%.  Lives in [place]. Please wait 4-6 weeks to set-up appointment.           Discharge Medications:   Medication List    STOP taking these medications        lisinopril-hydrochlorothiazide 10-12.5 MG per tablet  Commonly known as:  PRINZIDE,ZESTORETIC     penicillin v potassium 500 MG tablet  Commonly known as:  VEETID      TAKE these medications        furosemide 40 MG tablet  Commonly known as:  LASIX  Take 1 tablet (40 mg total) by mouth daily. X 1 week       linagliptin 5 MG Tabs tablet  Commonly known as:  TRADJENTA  Take 1 tablet (5 mg total) by mouth daily.     metoprolol tartrate 25 MG tablet  Commonly known as:  LOPRESSOR  Take 1 tablet (25 mg total) by mouth 2 (two) times daily.     oxyCODONE 5 MG immediate release tablet  Commonly known as:  Oxy IR/ROXICODONE  Take 1-2 tablets (5-10 mg total) by mouth every 3 (three) hours as needed for severe pain.     potassium chloride SA 20 MEQ tablet  Commonly known as:  K-DUR,KLOR-CON  Take 1 tablet (20 mEq total) by mouth daily. X 1 week     warfarin 5 MG tablet  Commonly known as:  COUMADIN  Take 1 tablet (5 mg total) by mouth daily. Or as directed by the Coumadin Clinic         The patient has been discharged on:   1.Beta Blocker:  Yes [  x ]                              No   [   ]                              If No, reason:  2.Ace Inhibitor/ARB: Yes [   ]                                     No  [  x  ]                                     If No, reason: low systolic BP and preserved LVEF  3.Statin:   Yes [   ]                  No  [  x ]                  If No, reason: No CAD  4.Shela CommonsVelta Addison  [   ]                  No   [ x  ]  If No, reason: No CAD, on Coumadin    Follow Up Appointments: Follow-up Information    Follow up with HAGER, BRYAN, PA-C On 03/09/2014.   Specialty:  Physician Assistant   Why:  Appoointment time is at 10:00 am   Contact information:   [address] [place] [address] [phone]       Follow up with Gaye Pollack, MD On 03/23/2014.   Specialty:  Cardiothoracic Surgery   Why:  PA/LAT CXR to be taken (at [facility] which is in the same building as Dr. Vivi Martens office) on 03/23/2014 at 8:30 am;Appointment time is at 9:30 am   Contact information:   [place] [address] [phone]       Follow up with Chevis Pretty, [place].   Specialty:  Nurse Practitioner    Why:  Call for a follow up appointment regarding further surveillance of HGA1C 10.3 and further diabetes management   Contact information:   [place] [address] [phone]       Follow up with Nurse On 02/24/2014.   Why:  Appointment is with nurse only to have chest tube sutures removed. Appointment time is at 10:00 am   Contact information:   [place] [address] [phone]      Follow up with [facility] On 02/21/2014.   Why:  PT and INR (as is on Coumadin) need to be drawn on Monday at 9:50 am   Contact information:   [place] [address] [phone]      Signed: Zamauri Nez HPA-C 02/18/2014, 8:02 AM

## 2014-02-17 NOTE — Progress Notes (Signed)
CARDIAC REHAB PHASE I   PRE:  Rate/Rhythm: 98 SR  BP:  Supine: 101/59  Sitting:   Standing:    SaO2: 97%RA  MODE:  Ambulation: 550 ft   POST:  Rate/Rhythm: 101 ST  BP:  Supine:   Sitting: 123/57  Standing:    SaO2: 99%RA 0820-0850 Pt walked 550 ft with hand held asst with steady gait. Tolerated well. No complaints. To recliner after walk. Discussed with pt the videos she could watch re post surgery and diabetes when ready.   Graylon Good, RN BSN  02/17/2014 8:47 AM

## 2014-02-17 NOTE — Progress Notes (Signed)
Inpatient Diabetes Program Recommendations  AACE/ADA: New Consensus Statement on Inpatient Glycemic Control (2013)  Target Ranges:  Prepandial:   less than 140 mg/dL      Peak postprandial:   less than 180 mg/dL (1-2 hours)      Critically ill patients:  140 - 180 mg/dL   Reason for Assessment: Results for MEHREEN, AZIZI (MRN [id number]) as of 02/17/2014 10:09  Ref. Range 02/16/2014 07:22 02/16/2014 12:09 02/16/2014 16:59 02/16/2014 20:43 02/17/2014 06:00  Glucose-Capillary Latest Range: 70-99 mg/dL 189 (H) 211 (H) 178 (H) 167 (H) 221 (H)    Results for MIREYA, MEDITZ (MRN [id number]) as of 02/17/2014 10:09  Ref. Range 02/10/2014 14:03  Hgb A1c MFr Bld Latest Range: 4.8-5.6 % 10.3 (H)   Based on AM CBG and A1C, patient may benefit from basal insulin at discharge.  Consider Levemir 14 units q HS. If basal insulin ordered at discharge, will need insulin education by bedside RN.   Thanks, Adah Perl, RN, BC-ADM Inpatient Diabetes Coordinator Pager [phone]

## 2014-02-17 NOTE — Progress Notes (Signed)
Spoke with PA will d/c EPW tomorrow morning.

## 2014-02-17 NOTE — Progress Notes (Signed)
Problem: Food- and Nutrition-Related Knowledge Deficit (NB-1.1) Goal: Nutrition Education Formal process to instruct or train a patient/client in a skill or to impart knowledge to help patients/clients voluntarily manage or modify food choices and eating behavior to maintain or improve health.  Outcome: Completed/Met Date Met: 01/24/14  RD consulted for nutrition education regarding diabetes.   Pt with hx of prediabetes, and reported mother has it. She is somewhat familiar with CHO sources. Pt likes to drink Sprite and sweet tea. She usually eats three meals a day, and stated she would ask her mother for carb counting advice.   Hemoglobin A1c is 10.3  RD provided "Carbohydrate Counting for People with Diabetes" handout from the Academy of Nutrition and Dietetics. Discussed reducing intake of sugary beverages such as Sprite and sweet tea. Explained differences between food groups and their effects on blood sugar, emphasizing carbohydrate-containing foods. Provided list of carbohydrates and recommended serving sizes of common foods.  Discussed importance of controlled and consistent carbohydrate intake throughout the day. Provided examples of ways to balance meals/snacks, by demonstrating appropriate macronutrient distributions on plate. Provided healthy snack options. Teach back method used.   Expect fair compliance.  Body mass index is 34.7 kg/(m^2). Pt meets criteria for obesity, class I based on current BMI.  Current diet order is Heart Healthy/Carb Modified. Labs and medications reviewed. No further nutrition interventions warranted at this time. If additional nutrition issues arise, please re-consult RD.  Elisa Grant, MS Dietetic Intern Pager: [phone]  

## 2014-02-18 LAB — PROTIME-INR
INR: 2.08 — ABNORMAL HIGH (ref 0.00–1.49)
Prothrombin Time: 23.6 seconds — ABNORMAL HIGH (ref 11.6–15.2)

## 2014-02-18 LAB — GLUCOSE, CAPILLARY: Glucose-Capillary: 160 mg/dL — ABNORMAL HIGH (ref 70–99)

## 2014-02-18 MED ORDER — LINAGLIPTIN 5 MG PO TABS: 5.0000 mg | ORAL_TABLET | Freq: Every day | ORAL | Status: DC

## 2014-02-18 MED ORDER — METOPROLOL TARTRATE 25 MG PO TABS: 25.0000 mg | ORAL_TABLET | Freq: Two times a day (BID) | ORAL | Status: DC

## 2014-02-18 MED ORDER — WARFARIN SODIUM 5 MG PO TABS: 5.0000 mg | ORAL_TABLET | Freq: Every day | ORAL | Status: DC

## 2014-02-18 MED ORDER — POTASSIUM CHLORIDE CRYS ER 20 MEQ PO TBCR
20.0000 meq | EXTENDED_RELEASE_TABLET | Freq: Every day | ORAL | Status: DC
Start: 2014-02-18 — End: 2014-03-03

## 2014-02-18 MED ORDER — FUROSEMIDE 40 MG PO TABS: 40.0000 mg | ORAL_TABLET | Freq: Every day | ORAL | Status: DC

## 2014-02-18 MED ORDER — OXYCODONE HCL 5 MG PO TABS: 5.0000 mg | ORAL_TABLET | ORAL | Status: DC | PRN

## 2014-02-18 NOTE — Progress Notes (Signed)
Pacing wires removed per MD order.  Sutures were clipped and wires were removed without incident.  There was some minimal bleeding from the left wire site that was stopped after a minute and gauze was placed over sites as well as chest tube suture sites.  Chest tube sutures are still in place.  Pt VS remained stable before and after removal.  Pt does not have c/o pain.  Will continue to monitor.

## 2014-02-18 NOTE — Progress Notes (Signed)
       [address]       [place] [address]             [phone]          4 Days Post-Op Procedure(s) (LRB): AORTIC VALVE REPLACEMENT (AVR) (N/A) INTRAOPERATIVE TRANSESOPHAGEAL ECHOCARDIOGRAM (N/A)  Subjective: Feels well, no complaints.  Ready to go home.   Objective: Vital signs in last 24 hours: Patient Vitals for the past 24 hrs:  BP Temp Temp src Pulse Resp SpO2 Weight  02/18/14 0535 (!) 101/51 mmHg 97.8 F (36.6 C) Oral 90 18 98 % 228 lb 6.3 oz (103.6 kg)  02/17/14 2244 (!) 122/56 mmHg - - - - - -  02/17/14 2021 109/65 mmHg 99.4 F (37.4 C) Oral (!) 103 18 97 % -  02/17/14 1448 91/64 mmHg 98.4 F (36.9 C) Oral 99 19 98 % -   Current Weight  02/18/14 228 lb 6.3 oz (103.6 kg)  PRE OP WEIGHT: 100 kg   Intake/Output from previous day: 02/04 0701 - 02/05 0700 In: 720 [P.O.:720] Out: -   CBGs [phone]   PHYSICAL EXAM:  Heart: RRR, good valve click Lungs: Clear Wound: Clean and dry Extremities: No significant LE edema    Lab Results: CBC: Recent Labs  02/16/14 0400 02/17/14 0402  WBC 9.9 8.7  HGB 9.7* 9.5*  HCT 29.4* 29.1*  PLT 121* 135*   BMET:  Recent Labs  02/16/14 0400 02/17/14 0402  NA 136 136  K 3.8 3.6  CL 105 107  CO2 28 24  GLUCOSE 198* 168*  BUN 8 10  CREATININE 0.63 0.53  CALCIUM 7.7* 7.9*    PT/INR:  Recent Labs  02/18/14 0526  LABPROT 23.6*  INR 2.08*      Assessment/Plan: S/P Procedure(s) (LRB): AORTIC VALVE REPLACEMENT (AVR) (N/A) INTRAOPERATIVE TRANSESOPHAGEAL ECHOCARDIOGRAM (N/A)  CV- HR controlled, BPs stable. Continue Lopressor. Coumadin.   Vol overload- continue diuresis.   DM- CBGs generally stable. Continue Trajenta (pt intolerant of Glipizide and Metformin). A1C=10.3  Home later today- instructions reviewed with patient.   LOS: 4 days    Xitlalic Maslin H 02/18/2014

## 2014-02-18 NOTE — Progress Notes (Signed)
0921-0952 Education completed with pt and daughter who voiced understanding. Discussed carb counting, sternal precautions, IS, ex ed, CRP 2. Pt stated she had had ed on Coumadin already. Put on discharge video when I left. Discussed CRP 2 and permission given to refer to [place] 2. Graylon Good RN BSN 02/18/2014 9:50 AM

## 2014-02-18 NOTE — Progress Notes (Signed)
Pt D/C'd home.  Alert and oriented x4.  VS at time of discharge were WNL.  Pt had no c/o pain.  Education was given to pt regarding medications including coumadin, post-op avr discharge instructions, diet, activity, and follow-up care and appointments.  Pt verbalized understanding.  Pt was given her prescriptions and taken home by husband and daughter.

## 2014-02-21 ENCOUNTER — Ambulatory Visit (INDEPENDENT_AMBULATORY_CARE_PROVIDER_SITE_OTHER): Payer: BLUE CROSS/BLUE SHIELD | Admitting: Pharmacist Clinician (PhC)/ Clinical Pharmacy Specialist

## 2014-02-21 DIAGNOSIS — Z7901 Long term (current) use of anticoagulants: Secondary | ICD-10-CM | POA: Insufficient documentation

## 2014-02-21 DIAGNOSIS — Z954 Presence of other heart-valve replacement: Secondary | ICD-10-CM

## 2014-02-21 DIAGNOSIS — Z952 Presence of prosthetic heart valve: Secondary | ICD-10-CM

## 2014-02-24 ENCOUNTER — Ambulatory Visit: Payer: Self-pay

## 2014-02-24 DIAGNOSIS — G8918 Other acute postprocedural pain: Secondary | ICD-10-CM

## 2014-02-24 DIAGNOSIS — Z954 Presence of other heart-valve replacement: Secondary | ICD-10-CM

## 2014-02-24 DIAGNOSIS — Z4802 Encounter for removal of sutures: Secondary | ICD-10-CM

## 2014-02-24 MED ORDER — OXYCODONE HCL 5 MG PO TABS: 5.0000 mg | ORAL_TABLET | Freq: Four times a day (QID) | ORAL | Status: DC | PRN

## 2014-02-24 NOTE — Progress Notes (Signed)
Removed 2 sutures from chest tube sites. No signs of infection and patient tolerated well. RX refill given for pain medication.

## 2014-02-28 ENCOUNTER — Ambulatory Visit: Payer: BLUE CROSS/BLUE SHIELD | Admitting: Pharmacist Clinician (PhC)/ Clinical Pharmacy Specialist

## 2014-02-28 ENCOUNTER — Ambulatory Visit: Payer: Self-pay | Admitting: Nurse Practitioner

## 2014-03-03 ENCOUNTER — Telehealth: Payer: Self-pay | Admitting: Nurse Practitioner

## 2014-03-03 ENCOUNTER — Ambulatory Visit (INDEPENDENT_AMBULATORY_CARE_PROVIDER_SITE_OTHER): Payer: BLUE CROSS/BLUE SHIELD | Admitting: Family

## 2014-03-03 ENCOUNTER — Encounter: Payer: Self-pay | Admitting: Family

## 2014-03-03 VITALS — BP 127/87 | HR 90 | Temp 99.0°F | Ht 67.0 in | Wt 222.0 lb

## 2014-03-03 DIAGNOSIS — R059 Cough, unspecified: Secondary | ICD-10-CM

## 2014-03-03 DIAGNOSIS — R05 Cough: Secondary | ICD-10-CM

## 2014-03-03 DIAGNOSIS — J069 Acute upper respiratory infection, unspecified: Secondary | ICD-10-CM

## 2014-03-03 MED ORDER — ALBUTEROL SULFATE HFA 108 (90 BASE) MCG/ACT IN AERS: 2.0000 | INHALATION_SPRAY | Freq: Four times a day (QID) | RESPIRATORY_TRACT | Status: DC | PRN

## 2014-03-03 MED ORDER — BENZONATATE 200 MG PO CAPS: 200.0000 mg | ORAL_CAPSULE | Freq: Three times a day (TID) | ORAL | Status: DC | PRN

## 2014-03-03 MED ORDER — AZITHROMYCIN 250 MG PO TABS: ORAL_TABLET | ORAL | Status: DC

## 2014-03-03 NOTE — Patient Instructions (Addendum)
Upper Respiratory Infection, Adult An upper respiratory infection (URI) is also sometimes known as the common cold. The upper respiratory tract includes the nose, sinuses, throat, trachea, and bronchi. Bronchi are the airways leading to the lungs. Most people improve within 1 week, but symptoms can last up to 2 weeks. A residual cough may last even longer.  CAUSES Many different viruses can infect the tissues lining the upper respiratory tract. The tissues become irritated and inflamed and often become very moist. Mucus production is also common. A cold is contagious. You can easily spread the virus to others by oral contact. This includes kissing, sharing a glass, coughing, or sneezing. Touching your mouth or nose and then touching a surface, which is then touched by another person, can also spread the virus. SYMPTOMS  Symptoms typically develop 1 to 3 days after you come in contact with a cold virus. Symptoms vary from person to person. They may include:  Runny nose.  Sneezing.  Nasal congestion.  Sinus irritation.  Sore throat.  Loss of voice (laryngitis).  Cough.  Fatigue.  Muscle aches.  Loss of appetite.  Headache.  Low-grade fever. DIAGNOSIS  You might diagnose your own cold based on familiar symptoms, since most people get a cold 2 to 3 times a year. Your caregiver can confirm this based on your exam. Most importantly, your caregiver can check that your symptoms are not due to another disease such as strep throat, sinusitis, pneumonia, asthma, or epiglottitis. Blood tests, throat tests, and X-rays are not necessary to diagnose a common cold, but they may sometimes be helpful in excluding other more serious diseases. Your caregiver will decide if any further tests are required. RISKS AND COMPLICATIONS  You may be at risk for a more severe case of the common cold if you smoke cigarettes, have chronic heart disease (such as heart failure) or lung disease (such as asthma), or if  you have a weakened immune system. The very young and very old are also at risk for more serious infections. Bacterial sinusitis, middle ear infections, and bacterial pneumonia can complicate the common cold. The common cold can worsen asthma and chronic obstructive pulmonary disease (COPD). Sometimes, these complications can require emergency medical care and may be life-threatening. PREVENTION  The best way to protect against getting a cold is to practice good hygiene. Avoid oral or hand contact with people with cold symptoms. Wash your hands often if contact occurs. There is no clear evidence that vitamin C, vitamin E, echinacea, or exercise reduces the chance of developing a cold. However, it is always recommended to get plenty of rest and practice good nutrition. TREATMENT  Treatment is directed at relieving symptoms. There is no cure. Antibiotics are not effective, because the infection is caused by a virus, not by bacteria. Treatment may include:  Increased fluid intake. Sports drinks offer valuable electrolytes, sugars, and fluids.  Breathing heated mist or steam (vaporizer or shower).  Eating chicken soup or other clear broths, and maintaining good nutrition.  Getting plenty of rest.  Using gargles or lozenges for comfort.  Controlling fevers with ibuprofen or acetaminophen as directed by your caregiver.  Increasing usage of your inhaler if you have asthma. Zinc gel and zinc lozenges, taken in the first 24 hours of the common cold, can shorten the duration and lessen the severity of symptoms. Pain medicines may help with fever, muscle aches, and throat pain. A variety of non-prescription medicines are available to treat congestion and runny nose. Your caregiver   can make recommendations and may suggest nasal or lung inhalers for other symptoms.  HOME CARE INSTRUCTIONS   Only take over-the-counter or prescription medicines for pain, discomfort, or fever as directed by your  caregiver.  Use a warm mist humidifier or inhale steam from a shower to increase air moisture. This may keep secretions moist and make it easier to breathe.  Drink enough water and fluids to keep your urine clear or pale yellow.  Rest as needed.  Return to work when your temperature has returned to normal or as your caregiver advises. You may need to stay home longer to avoid infecting others. You can also use a face mask and careful hand washing to prevent spread of the virus. SEEK MEDICAL CARE IF:   After the first few days, you feel you are getting worse rather than better.  You need your caregiver's advice about medicines to control symptoms.  You develop chills, worsening shortness of breath, or brown or red sputum. These may be signs of pneumonia.  You develop yellow or brown nasal discharge or pain in the face, especially when you bend forward. These may be signs of sinusitis.  You develop a fever, swollen neck glands, pain with swallowing, or white areas in the back of your throat. These may be signs of strep throat. SEEK IMMEDIATE MEDICAL CARE IF:   You have a fever.  You develop severe or persistent headache, ear pain, sinus pain, or chest pain.  You develop wheezing, a prolonged cough, cough up blood, or have a change in your usual mucus (if you have chronic lung disease).  You develop sore muscles or a stiff neck. Document Released: 06/26/2000 Document Revised: 03/25/2011 Document Reviewed: 04/07/2013 ExitCare Patient Information 2015 [employer]. This information is not intended to replace advice given to you by your health care provider. Make sure you discuss any questions you have with your health care provider.  - Take meds as prescribed - Use a cool mist humidifier  -Use saline nose sprays frequently -Saline irrigations of the nose can be very helpful if done frequently.  * 4X daily for 1 week*  * Use of a nettie pot can be helpful with this. Follow  directions with this* -Force fluids -For any cough or congestion  Use plain Mucinex- regular strength or max strength is fine   * Children- consult with Pharmacist for dosing -For fever or aces or pains- take tylenol or ibuprofen appropriate for age and weight.  * for fevers greater than 101 orally you may alternate ibuprofen and tylenol every  3 hours. -Throat lozenges if help   Yazmeen Tab Rylee, FNP   

## 2014-03-03 NOTE — Telephone Encounter (Signed)
Appointment for 2:40 today

## 2014-03-03 NOTE — Progress Notes (Signed)
Subjective:    Patient ID: Anna Salinas, female    DOB: May 08, 1970, 44 y.o.   MRN: [id number]  Cough This is a new problem. The current episode started yesterday. The problem has been unchanged. The problem occurs constantly. The cough is productive of sputum and productive of purulent sputum. Associated symptoms include chills, nasal congestion, postnasal drip and rhinorrhea. Pertinent negatives include no ear congestion, ear pain, fever, headaches, hemoptysis, myalgias, sore throat, shortness of breath or wheezing. The symptoms are aggravated by lying down. She has tried OTC cough suppressant for the symptoms. The treatment provided mild relief. There is no history of asthma or COPD.      Review of Systems  Constitutional: Positive for chills. Negative for fever.  HENT: Positive for postnasal drip and rhinorrhea. Negative for ear pain and sore throat.   Eyes: Negative.   Respiratory: Positive for cough. Negative for hemoptysis, shortness of breath and wheezing.   Cardiovascular: Negative.  Negative for palpitations.  Gastrointestinal: Negative.   Endocrine: Negative.   Genitourinary: Negative.   Musculoskeletal: Negative.  Negative for myalgias.  Neurological: Negative.  Negative for headaches.  Hematological: Negative.   Psychiatric/Behavioral: Negative.   All other systems reviewed and are negative.      Objective:   Physical Exam  Constitutional: She is oriented to person, place, and time. She appears well-developed and well-nourished. No distress.  HENT:  Head: Normocephalic and atraumatic.  Right Ear: External ear normal.  Left Ear: External ear normal.  Nasal passage erythemas with mild swelling  Oropharynx erythemas   Eyes: Pupils are equal, round, and reactive to light.  Neck: Normal range of motion. Neck supple. No thyromegaly present.  Cardiovascular: Normal rate, regular rhythm, normal heart sounds and intact distal pulses.   No murmur  heard. Pulmonary/Chest: Effort normal. No respiratory distress. She has wheezes.  Abdominal: Soft. Bowel sounds are normal. She exhibits no distension. There is no tenderness.  Musculoskeletal: Normal range of motion. She exhibits no edema or tenderness.  Neurological: She is alert and oriented to person, place, and time. She has normal reflexes. No cranial nerve deficit.  Skin: Skin is warm and dry.  Psychiatric: She has a normal mood and affect. Her behavior is normal. Judgment and thought content normal.  Vitals reviewed.   BP 127/87 mmHg  Pulse 90  Temp(Src) 99 F (37.2 C) (Oral)  Ht 5\' 7"  (1.702 m)  Wt 222 lb (100.699 kg)  BMI 34.76 kg/m2  LMP 02/10/2014       Assessment & Plan:  1. URI (upper respiratory infection) -- Take meds as prescribed - Use a cool mist humidifier  -Use saline nose sprays frequently -Saline irrigations of the nose can be very helpful if done frequently.  * 4X daily for 1 week*  * Use of a nettie pot can be helpful with this. Follow directions with this* -Force fluids -For any cough or congestion  Use plain Mucinex- regular strength or max strength is fine   * Children- consult with Pharmacist for dosing -For fever or aces or pains- take tylenol or ibuprofen appropriate for age and weight.  * for fevers greater than 101 orally you may alternate ibuprofen and tylenol every  3 hours. -Throat lozenges if help - albuterol (PROVENTIL HFA;VENTOLIN HFA) 108 (90 BASE) MCG/ACT inhaler; Inhale 2 puffs into the lungs every 6 (six) hours as needed for wheezing or shortness of breath.  Dispense: 1 Inhaler; Refill: 2 - benzonatate (TESSALON) 200 MG capsule; Take 1 capsule (  200 mg total) by mouth 3 (three) times daily as needed.  Dispense: 30 capsule; Refill: 1 - azithromycin (ZITHROMAX) 250 MG tablet; Take 500 mg once, then 250 mg for four days  Dispense: 6 tablet; Refill: 0  2. Cough - benzonatate (TESSALON) 200 MG capsule; Take 1 capsule (200 mg total) by  mouth 3 (three) times daily as needed.  Dispense: 30 capsule; Refill: Cross, FNP

## 2014-03-04 ENCOUNTER — Ambulatory Visit: Payer: Self-pay | Admitting: Nurse Practitioner

## 2014-03-07 ENCOUNTER — Telehealth: Payer: Self-pay | Admitting: Cardiology

## 2014-03-07 ENCOUNTER — Ambulatory Visit (INDEPENDENT_AMBULATORY_CARE_PROVIDER_SITE_OTHER): Payer: BLUE CROSS/BLUE SHIELD | Admitting: Pharmacist Clinician (PhC)/ Clinical Pharmacy Specialist

## 2014-03-07 DIAGNOSIS — Z7901 Long term (current) use of anticoagulants: Secondary | ICD-10-CM

## 2014-03-07 DIAGNOSIS — Z952 Presence of prosthetic heart valve: Secondary | ICD-10-CM

## 2014-03-07 DIAGNOSIS — Z954 Presence of other heart-valve replacement: Secondary | ICD-10-CM

## 2014-03-07 LAB — PROTIME-INR
INR: 5.33 (ref <1.50)
PROTHROMBIN TIME: 48.8 s — AB (ref 11.6–15.2)

## 2014-03-07 NOTE — Telephone Encounter (Signed)
See anit coag note,  Pt informed, will repeat INR Wednesday

## 2014-03-07 NOTE — Telephone Encounter (Signed)
Received a call from [facility] with [facility].Patient's INR 5.33.Message sent to [facility].

## 2014-03-09 ENCOUNTER — Ambulatory Visit (INDEPENDENT_AMBULATORY_CARE_PROVIDER_SITE_OTHER): Payer: BLUE CROSS/BLUE SHIELD | Admitting: Physician Assistant

## 2014-03-09 ENCOUNTER — Encounter: Payer: Self-pay | Admitting: Physician Assistant

## 2014-03-09 ENCOUNTER — Ambulatory Visit (INDEPENDENT_AMBULATORY_CARE_PROVIDER_SITE_OTHER): Payer: BLUE CROSS/BLUE SHIELD | Admitting: Pharmacist Clinician (PhC)/ Clinical Pharmacy Specialist

## 2014-03-09 VITALS — BP 110/70 | HR 88 | Ht 67.0 in | Wt 227.0 lb

## 2014-03-09 DIAGNOSIS — R0600 Dyspnea, unspecified: Secondary | ICD-10-CM

## 2014-03-09 DIAGNOSIS — Z954 Presence of other heart-valve replacement: Secondary | ICD-10-CM

## 2014-03-09 DIAGNOSIS — Z952 Presence of prosthetic heart valve: Secondary | ICD-10-CM

## 2014-03-09 DIAGNOSIS — Z7901 Long term (current) use of anticoagulants: Secondary | ICD-10-CM

## 2014-03-09 DIAGNOSIS — I10 Essential (primary) hypertension: Secondary | ICD-10-CM

## 2014-03-09 LAB — POCT INR: INR: 2.5

## 2014-03-09 NOTE — Patient Instructions (Signed)
Your physician recommends that you schedule a follow-up appointment in: 3 months with Dr. Martinique

## 2014-03-09 NOTE — Assessment & Plan Note (Signed)
INR today is 2.5. Crisp mechanical valve clicks noted on exam. There is no murmur noted.

## 2014-03-09 NOTE — Assessment & Plan Note (Signed)
Blood pressure well controlled

## 2014-03-09 NOTE — Progress Notes (Signed)
Patient ID: Anna Salinas, female   DOB: 01-Apr-1970, 44 y.o.   MRN: [id number]    Date:  03/09/2014   ID:  Anna Salinas, DOB 06-23-1970, MRN [id number]  PCP:  Chevis Pretty, FNP  Primary Cardiologist:  Martinique  Chief Complaint  Patient presents with  . Follow-up    Post aortic valve surgery follow-up     History of Present Illness: Anna Salinas is a 44 y.o. female with a history of hypertension and known aortic stenosis. She underwent aortic valve replacement with Dr. Caffie Pinto on 02/14/2014 with a St. Jude Regent mechanical heart valve. She is now on Coumadin with an INR today of 2.5.  She presents today for a posthospital follow-up. She was seen by her primary care provider recently started on azithromycin for upper to upper respiratory infection. She's had some mild left pectoral/axillary pain which was precipitated by cough. Other than this she feels well  The patient currently denies nausea, vomiting, fever, chest pain, shortness of breath, orthopnea, dizziness, PND, cough, congestion, abdominal pain, hematochezia, melena, lower extremity edema, claudication.  Wt Readings from Last 3 Encounters:  03/09/14 227 lb (102.967 kg)  03/03/14 222 lb (100.699 kg)  02/18/14 228 lb 6.3 oz (103.6 kg)     Past Medical History  Diagnosis Date  . Essential hypertension, benign   . Palpitations   . Prediabetes   . Aortic stenosis due to bicuspid aortic valve   . Heart murmur   . Diabetes mellitus without complication     Current Outpatient Prescriptions  Medication Sig Dispense Refill  . Chlorpheniramine-DM (CORICIDIN HBP COUGH/COLD PO) Take 1 tablet by mouth as needed (for cough).    . metoprolol tartrate (LOPRESSOR) 25 MG tablet Take 1 tablet (25 mg total) by mouth 2 (two) times daily. 60 tablet 1  . oxyCODONE (OXY IR/ROXICODONE) 5 MG immediate release tablet Take 1-2 tablets (5-10 mg total) by mouth every 6 (six) hours as needed for severe pain. 40 tablet 0  . warfarin  (COUMADIN) 5 MG tablet Take 1 tablet (5 mg total) by mouth daily. Or as directed by the Coumadin Clinic 40 tablet 1   No current facility-administered medications for this visit.    Allergies:    Allergies  Allergen Reactions  . Glipizide Nausea And Vomiting and Other (See Comments)    GI upset  . Shellfish Allergy Swelling    Social History:  The patient  reports that she has never smoked. She has never used smokeless tobacco. She reports that she does not drink alcohol or use illicit drugs.   Family history:   Family History  Problem Relation Age of Onset  . Diabetes Mother   . Brain cancer Brother   . Cancer Maternal Grandmother     ROS:  Please see the history of present illness.  All other systems reviewed and negative.   PHYSICAL EXAM: VS:  BP 110/70 mmHg  Pulse 88  Ht 5\' 7"  (1.702 m)  Wt 227 lb (102.967 kg)  BMI 35.55 kg/m2  LMP 02/10/2014 Obese, well developed, in no acute distress HEENT: Pupils are equal round react to light accommodation extraocular movements are intact.  Neck: no JVDNo cervical lymphadenopathy. Cardiac: Regular rate and rhythm crisp mechanical valve clicks  Lungs:  Left-sided crackles and wheezing Abd: soft, nontender, positive bowel sounds all quadrants, no hepatosplenomegaly Ext: no lower extremity edema.  2+ radial and dorsalis pedis pulses. Skin: warm and dry Neuro:  Grossly normal  EKG:  Normal sinus rhythm  rate 88 bpm septal Q waves   ASSESSMENT AND PLAN:  Problem List Items Addressed This Visit    Long-term (current) use of anticoagulants   Essential hypertension, benign    Blood pressure well-controlled.      Aortic valve replaced    INR today is 2.5. Crisp mechanical valve clicks noted on exam. There is no murmur noted.       Other Visit Diagnoses    Dyspnea    -  Primary    Relevant Orders    EKG 12-Lead

## 2014-03-14 ENCOUNTER — Other Ambulatory Visit: Payer: Self-pay | Admitting: *Deleted

## 2014-03-14 DIAGNOSIS — G8918 Other acute postprocedural pain: Secondary | ICD-10-CM

## 2014-03-14 MED ORDER — OXYCODONE HCL 5 MG PO TABS: 5.0000 mg | ORAL_TABLET | Freq: Four times a day (QID) | ORAL | Status: DC | PRN

## 2014-03-14 NOTE — Telephone Encounter (Signed)
Anna Salinas has called requesting a refill for pain.  I said a new script would be available at our office tomorrow and she agreed.

## 2014-03-16 ENCOUNTER — Telehealth: Payer: Self-pay | Admitting: Pharmacist Clinician (PhC)/ Clinical Pharmacy Specialist

## 2014-03-16 ENCOUNTER — Ambulatory Visit (INDEPENDENT_AMBULATORY_CARE_PROVIDER_SITE_OTHER): Payer: BLUE CROSS/BLUE SHIELD | Admitting: Pharmacist Clinician (PhC)/ Clinical Pharmacy Specialist

## 2014-03-16 DIAGNOSIS — Z7901 Long term (current) use of anticoagulants: Secondary | ICD-10-CM

## 2014-03-16 DIAGNOSIS — Z954 Presence of other heart-valve replacement: Secondary | ICD-10-CM

## 2014-03-16 DIAGNOSIS — Z952 Presence of prosthetic heart valve: Secondary | ICD-10-CM

## 2014-03-16 LAB — POCT INR: INR: 5.2

## 2014-03-16 NOTE — Telephone Encounter (Signed)
Pt in office for INR check this AM.  Has complaint about metoprolol, states that 30-60 minutes after taking she begins to feel flush, nauseated, dizzy, like she might pass out.   Spoke to her surgeon who suggested only take qd (25 mg bid dose) until sees cardiology.  I asked pt to try 12.5 mg (1/2 tab) twice daily for 2-3 days and call if no change.  Pt agreeable to this.  Also, patient concerned due to increased swelling in R foot for last day or two.  Denies twisting ankle, hitting foot.  No muscle pain or tenderness, just swollen.  Advised patient I would pass this information to Dr. Martinique to see how it can be addressed.

## 2014-03-17 ENCOUNTER — Encounter (HOSPITAL_COMMUNITY)
Admission: RE | Admit: 2014-03-17 | Discharge: 2014-03-17 | Disposition: A | Discharge status: Home / Self Care | Payer: BLUE CROSS/BLUE SHIELD | Source: Ambulatory Visit | Attending: Cardiology | Admitting: Cardiology

## 2014-03-17 ENCOUNTER — Telehealth: Payer: Self-pay | Admitting: Cardiology

## 2014-03-17 ENCOUNTER — Encounter (HOSPITAL_COMMUNITY): Payer: Self-pay

## 2014-03-17 VITALS — BP 118/78 | HR 103 | Ht 67.0 in | Wt 237.8 lb

## 2014-03-17 DIAGNOSIS — Z954 Presence of other heart-valve replacement: Secondary | ICD-10-CM | POA: Insufficient documentation

## 2014-03-17 DIAGNOSIS — Z952 Presence of prosthetic heart valve: Secondary | ICD-10-CM

## 2014-03-17 DIAGNOSIS — I350 Nonrheumatic aortic (valve) stenosis: Secondary | ICD-10-CM | POA: Insufficient documentation

## 2014-03-17 NOTE — Telephone Encounter (Signed)
Not sure what to make of swollen right ankle. If no pain or redness I would just elevate it. She has appt. Next week with Dr. Cyndia Bent.   Peter Martinique MD, [facility]

## 2014-03-17 NOTE — Progress Notes (Signed)
Patient referred to Cardiac Rehab by Dr. Peter Martinique due to Aortic Valve Repair (Z95.4) Dr. Peter Martinique is her cardiologist and Dr. Shelah Lewandowsky Martinis her PCP.  During orientation advised patient on arrival and appointment times what to wear, what to do before, during and after exercise.  Reviewed attendance and class policy.  Talked about inclement weather and class consultation policy. Patient is scheduled to start cardiac Rehab on Monday, March 21, 2014 at 0930 am.  Patient was advised to come to class 5 minutes before class starts.  She was also given instructions on meeting with the dietician and attending the Family Structure classes. Pt is eager to get started.  Pt finished pre 6 minute walk test.

## 2014-03-17 NOTE — Telephone Encounter (Signed)
Returned call to patient no answer.LMTC. 

## 2014-03-17 NOTE — Patient Instructions (Signed)
Pt has finished orientation and is scheduled to start CR on Monday, March 21, 2014 at 0930. Pt has been instructed to arrive to class 15 minutes early for scheduled class. Pt has been instructed to wear comfortable clothing and shoes with rubber soles. Pt has been told to take their medications 1 hour prior to coming to class.  If the patient is not going to attend class, she has been instructed to call.

## 2014-03-17 NOTE — Telephone Encounter (Signed)
See previous 03/17/14 note.

## 2014-03-17 NOTE — Telephone Encounter (Signed)
Returned call to patient no answer.Left message on personal voice mail Dr.Jordan advised if no pain or redness just elevate it.Advised to keep appointment with [facility] 03/23/14.

## 2014-03-17 NOTE — Telephone Encounter (Signed)
Pt is returning Anna Salinas's call in reference to some swelling she was having , and she was waiting for input from Dr. Martinique on what she should do next. Please f/u  Thanks

## 2014-03-17 NOTE — Progress Notes (Signed)
Cardiac/Pulmonary Rehab Medication Review by a Pharmacist  Does the patient  feel that his/her medications are working for him/her?  yes  Has the patient been experiencing any side effects to the medications prescribed?  yes  Does the patient measure his/her own blood pressure or blood glucose at home?  no   Does the patient have any problems obtaining medications due to transportation or finances?   no  Understanding of regimen: good Understanding of indications: good Potential of compliance: excellent  Questions asked to Determine Patient Understanding of Medication Regimen:  1. What is the name of the medication?  2. What is the medication used for?  3. When should it be taken?  4. How much should be taken?  5. How will you take it?  6. What side effects should you report?  Understanding Defined as: Excellent: All questions above are correct Good: Questions 1-4 are correct Fair: Questions 1-2 are correct  Poor: 1 or none of the above questions are correct   Pharmacist comments: Pt states that the Metoprolol is "making her sick" and the Dr reduced the dose to 12.5mg  (1/2 tablet) twice daily.  Will re-evaluate on next visit to MD.  Pt on Coumadin for mechanical valve and dose was reduced due to elevated INR at last check.  Next INR check is scheduled for next Wed (03/23/14).  Pt tolerates Oxycodone fine with no issues except gets sleepy.    Hart Robinsons A 03/17/2014 9:16 AM

## 2014-03-21 ENCOUNTER — Telehealth: Payer: Self-pay | Admitting: Cardiology

## 2014-03-21 ENCOUNTER — Encounter (HOSPITAL_COMMUNITY)
Admission: RE | Admit: 2014-03-21 | Discharge: 2014-03-21 | Disposition: A | Discharge status: Home / Self Care | Payer: BLUE CROSS/BLUE SHIELD | Source: Ambulatory Visit | Attending: Cardiology | Admitting: Cardiology

## 2014-03-21 ENCOUNTER — Telehealth: Payer: Self-pay | Admitting: Nurse Practitioner

## 2014-03-21 ENCOUNTER — Other Ambulatory Visit: Payer: Self-pay | Admitting: Surgery

## 2014-03-21 DIAGNOSIS — I350 Nonrheumatic aortic (valve) stenosis: Secondary | ICD-10-CM | POA: Diagnosis not present

## 2014-03-21 DIAGNOSIS — Z954 Presence of other heart-valve replacement: Secondary | ICD-10-CM | POA: Diagnosis present

## 2014-03-21 NOTE — Telephone Encounter (Signed)
Pt says her feet are swollen much worse than last week. Please call to advise.

## 2014-03-21 NOTE — Telephone Encounter (Signed)
Appointment scheduled for 3/17 with Ronnald Collum, FNP.

## 2014-03-21 NOTE — Telephone Encounter (Signed)
Returned call to patient she stated swelling in feet worse,can hardly get shoes on this morning.Sob when she walks.Stated she has been elevating feet and is not helping.Message sent to [place] for advice.

## 2014-03-22 ENCOUNTER — Telehealth: Payer: Self-pay | Admitting: Cardiology

## 2014-03-22 NOTE — Telephone Encounter (Signed)
I spoke with patient.  The forms came from her insurance company inquiring about her diagnosis. They are not FMLA forms.  I don't see anything in Epic.  I will defer to [facility] for form information.

## 2014-03-22 NOTE — Telephone Encounter (Signed)
Pt called in stating that her insurance company faxed over some disability forms on 3/2 for Dr. Martinique to sign and she wanted to know if he received them. Please call  Thanks

## 2014-03-22 NOTE — Telephone Encounter (Signed)
Start lasix 40 mg daily x 3 days then 20 mg daily. Also take potassium 20 meq daily. Will need BMET in 1-2 weeks.  Brittannie Tawney Martinique MD, [facility]

## 2014-03-23 ENCOUNTER — Encounter (HOSPITAL_COMMUNITY): Payer: BLUE CROSS/BLUE SHIELD

## 2014-03-23 ENCOUNTER — Ambulatory Visit
Admission: RE | Admit: 2014-03-23 | Discharge: 2014-03-23 | Disposition: A | Discharge status: Home / Self Care | Payer: BLUE CROSS/BLUE SHIELD | Source: Ambulatory Visit | Attending: Surgery | Admitting: Surgery

## 2014-03-23 ENCOUNTER — Ambulatory Visit: Payer: Self-pay | Admitting: Surgery

## 2014-03-23 ENCOUNTER — Ambulatory Visit (INDEPENDENT_AMBULATORY_CARE_PROVIDER_SITE_OTHER): Payer: BLUE CROSS/BLUE SHIELD | Admitting: Pharmacist Clinician (PhC)/ Clinical Pharmacy Specialist

## 2014-03-23 DIAGNOSIS — Z7901 Long term (current) use of anticoagulants: Secondary | ICD-10-CM

## 2014-03-23 DIAGNOSIS — Z952 Presence of prosthetic heart valve: Secondary | ICD-10-CM

## 2014-03-23 DIAGNOSIS — Z954 Presence of other heart-valve replacement: Secondary | ICD-10-CM

## 2014-03-23 DIAGNOSIS — I350 Nonrheumatic aortic (valve) stenosis: Secondary | ICD-10-CM

## 2014-03-23 LAB — POCT INR: INR: 2.7

## 2014-03-23 MED ORDER — POTASSIUM CHLORIDE ER 20 MEQ PO TBCR: 20.0000 meq | EXTENDED_RELEASE_TABLET | Freq: Every day | ORAL | Status: DC

## 2014-03-23 MED ORDER — FUROSEMIDE 20 MG PO TABS: ORAL_TABLET | ORAL | Status: DC

## 2014-03-23 NOTE — Telephone Encounter (Signed)
She wants to know if her papers are ready? Also she has a question about her medicine please.

## 2014-03-23 NOTE — Telephone Encounter (Signed)
Returned call to patient.She stated metoprolol 25 mg 1/2 tablet twice a day making her sick.Advised Dr.Jordan out of office will check with him tomorrow and call her back.Also she stated her work faxed over short term disability paperwork 03/16/14 and they have not heard back.Advised I will check with Jeani Hawking in medical records and let her know.

## 2014-03-23 NOTE — Telephone Encounter (Signed)
Patient had  INR with Erasmo Downer this morning she gave patient Dr.Jordan's instructions and sent prescriptions to pharmacy.

## 2014-03-24 NOTE — Telephone Encounter (Signed)
Returned call to patient Dr.Jordan advised to stop metoprolol.Advised to monitor B/P and call back if elevated.Advised Jeani Hawking in medical records out of office this afternoon will check with her tomorrow about paperwork.

## 2014-03-25 ENCOUNTER — Other Ambulatory Visit: Payer: Self-pay | Admitting: Physician Assistant

## 2014-03-25 ENCOUNTER — Encounter (HOSPITAL_COMMUNITY): Payer: BLUE CROSS/BLUE SHIELD

## 2014-03-25 ENCOUNTER — Other Ambulatory Visit: Payer: Self-pay | Admitting: Surgery

## 2014-03-25 ENCOUNTER — Telehealth: Payer: Self-pay | Admitting: Physician Assistant

## 2014-03-25 DIAGNOSIS — I350 Nonrheumatic aortic (valve) stenosis: Secondary | ICD-10-CM

## 2014-03-25 DIAGNOSIS — Z952 Presence of prosthetic heart valve: Secondary | ICD-10-CM

## 2014-03-25 DIAGNOSIS — R600 Localized edema: Secondary | ICD-10-CM

## 2014-03-25 NOTE — Telephone Encounter (Signed)
Has this been handled yet?

## 2014-03-25 NOTE — Telephone Encounter (Signed)
Returned call to patient spoke to [place] in medical records she will check on disability paperwork and call you back this afternoon.

## 2014-03-25 NOTE — Telephone Encounter (Signed)
Pt called because she has been gaining fluid, with increasing LE edema and DOE. Her weight is up about 6 lbs. She cannot get on her shoes. The edema is bilateral and equal.  She had called about this on 03/07, and had taken Lasix 40 mg daily x 3 days as directed. She got some relief from this, but still had significant edema. What to do?  Advised her to continue taking the extra Lasix and potassium. Advised her she needs lab work next week.   Will send message to office to get Flex clinic visit next week w/ BMET.  Rosaria Ferries, PA-C 03/25/2014 10:35 PM Beeper [phone]

## 2014-03-26 ENCOUNTER — Emergency Department: Payer: BLUE CROSS/BLUE SHIELD

## 2014-03-26 ENCOUNTER — Inpatient Hospital Stay (HOSPITAL_COMMUNITY)
Admission: EM | Admit: 2014-03-26 | Discharge: 2014-04-01 | DRG: 271 | Disposition: A | Discharge status: Home / Self Care | Payer: BLUE CROSS/BLUE SHIELD | Attending: Cardiology | Admitting: Cardiology

## 2014-03-26 ENCOUNTER — Emergency Department (HOSPITAL_COMMUNITY)
Admit: 2014-03-26 | Discharge: 2014-03-26 | Disposition: A | Discharge status: Home / Self Care | Payer: BLUE CROSS/BLUE SHIELD | Attending: Surgery | Admitting: Surgery

## 2014-03-26 ENCOUNTER — Encounter (HOSPITAL_COMMUNITY): Payer: Self-pay | Admitting: Physical Medicine and Rehabilitation

## 2014-03-26 DIAGNOSIS — J90 Pleural effusion, not elsewhere classified: Secondary | ICD-10-CM | POA: Diagnosis present

## 2014-03-26 DIAGNOSIS — Z952 Presence of prosthetic heart valve: Secondary | ICD-10-CM | POA: Diagnosis not present

## 2014-03-26 DIAGNOSIS — I319 Disease of pericardium, unspecified: Secondary | ICD-10-CM

## 2014-03-26 DIAGNOSIS — R000 Tachycardia, unspecified: Secondary | ICD-10-CM | POA: Diagnosis present

## 2014-03-26 DIAGNOSIS — Z794 Long term (current) use of insulin: Secondary | ICD-10-CM

## 2014-03-26 DIAGNOSIS — E119 Type 2 diabetes mellitus without complications: Secondary | ICD-10-CM | POA: Diagnosis present

## 2014-03-26 DIAGNOSIS — Z79899 Other long term (current) drug therapy: Secondary | ICD-10-CM | POA: Diagnosis not present

## 2014-03-26 DIAGNOSIS — J9811 Atelectasis: Secondary | ICD-10-CM | POA: Diagnosis present

## 2014-03-26 DIAGNOSIS — I313 Pericardial effusion (noninflammatory): Secondary | ICD-10-CM | POA: Diagnosis present

## 2014-03-26 DIAGNOSIS — I350 Nonrheumatic aortic (valve) stenosis: Secondary | ICD-10-CM | POA: Diagnosis present

## 2014-03-26 DIAGNOSIS — E669 Obesity, unspecified: Secondary | ICD-10-CM | POA: Diagnosis present

## 2014-03-26 DIAGNOSIS — Z6837 Body mass index (BMI) 37.0-37.9, adult: Secondary | ICD-10-CM | POA: Diagnosis not present

## 2014-03-26 DIAGNOSIS — Z91013 Allergy to seafood: Secondary | ICD-10-CM | POA: Diagnosis not present

## 2014-03-26 DIAGNOSIS — I314 Cardiac tamponade: Secondary | ICD-10-CM | POA: Diagnosis present

## 2014-03-26 DIAGNOSIS — Z7901 Long term (current) use of anticoagulants: Secondary | ICD-10-CM

## 2014-03-26 DIAGNOSIS — Z6834 Body mass index (BMI) 34.0-34.9, adult: Secondary | ICD-10-CM | POA: Diagnosis present

## 2014-03-26 DIAGNOSIS — I970 Postcardiotomy syndrome: Secondary | ICD-10-CM | POA: Insufficient documentation

## 2014-03-26 DIAGNOSIS — I3139 Other pericardial effusion (noninflammatory): Secondary | ICD-10-CM | POA: Diagnosis present

## 2014-03-26 DIAGNOSIS — Z9689 Presence of other specified functional implants: Secondary | ICD-10-CM

## 2014-03-26 DIAGNOSIS — I10 Essential (primary) hypertension: Secondary | ICD-10-CM | POA: Diagnosis present

## 2014-03-26 DIAGNOSIS — Z888 Allergy status to other drugs, medicaments and biological substances status: Secondary | ICD-10-CM | POA: Diagnosis not present

## 2014-03-26 DIAGNOSIS — Z954 Presence of other heart-valve replacement: Secondary | ICD-10-CM | POA: Diagnosis not present

## 2014-03-26 DIAGNOSIS — Z452 Encounter for adjustment and management of vascular access device: Secondary | ICD-10-CM

## 2014-03-26 HISTORY — DX: Type 2 diabetes mellitus without complications: E11.9

## 2014-03-26 LAB — CBC WITH DIFFERENTIAL/PLATELET
Basophils Absolute: 0 10*3/uL (ref 0.0–0.1)
Basophils Relative: 0 % (ref 0–1)
Eosinophils Absolute: 0.1 10*3/uL (ref 0.0–0.7)
Eosinophils Relative: 2 % (ref 0–5)
HCT: 36.5 % (ref 36.0–46.0)
HEMOGLOBIN: 11.3 g/dL — AB (ref 12.0–15.0)
Lymphocytes Relative: 25 % (ref 12–46)
Lymphs Abs: 1.5 10*3/uL (ref 0.7–4.0)
MCH: 26 pg (ref 26.0–34.0)
MCHC: 31 g/dL (ref 30.0–36.0)
MCV: 84.1 fL (ref 78.0–100.0)
MONOS PCT: 11 % (ref 3–12)
Monocytes Absolute: 0.6 10*3/uL (ref 0.1–1.0)
Neutro Abs: 3.7 10*3/uL (ref 1.7–7.7)
Neutrophils Relative %: 62 % (ref 43–77)
Platelets: 229 10*3/uL (ref 150–400)
RBC: 4.34 MIL/uL (ref 3.87–5.11)
RDW: 14.8 % (ref 11.5–15.5)
WBC: 5.9 10*3/uL (ref 4.0–10.5)

## 2014-03-26 LAB — COMPREHENSIVE METABOLIC PANEL
ALBUMIN: 3.5 g/dL (ref 3.5–5.2)
ALT: 27 U/L (ref 0–35)
ANION GAP: 10 (ref 5–15)
AST: 41 U/L — AB (ref 0–37)
Alkaline Phosphatase: 65 U/L (ref 39–117)
BUN: <5 mg/dL — ABNORMAL LOW (ref 6–23)
CALCIUM: 8.8 mg/dL (ref 8.4–10.5)
CHLORIDE: 99 mmol/L (ref 96–112)
CO2: 29 mmol/L (ref 19–32)
Creatinine, Ser: 0.72 mg/dL (ref 0.50–1.10)
GFR calc Af Amer: >90 mL/min (ref >90)
GFR calc non Af Amer: >90 mL/min (ref >90)
Glucose, Bld: 178 mg/dL — ABNORMAL HIGH (ref 70–99)
Potassium: 2.9 mmol/L — ABNORMAL LOW (ref 3.5–5.1)
SODIUM: 138 mmol/L (ref 135–145)
TOTAL PROTEIN: 6.7 g/dL (ref 6.0–8.3)
Total Bilirubin: 0.8 mg/dL (ref 0.3–1.2)

## 2014-03-26 LAB — GLUCOSE, CAPILLARY: Glucose-Capillary: 154 mg/dL — ABNORMAL HIGH (ref 70–99)

## 2014-03-26 LAB — BRAIN NATRIURETIC PEPTIDE: B Natriuretic Peptide: 272 pg/mL — ABNORMAL HIGH (ref 0.0–100.0)

## 2014-03-26 LAB — I-STAT TROPONIN, ED: Troponin i, poc: 0.01 ng/mL (ref 0.00–0.08)

## 2014-03-26 LAB — PROTIME-INR
INR: 2.22 — ABNORMAL HIGH (ref 0.00–1.49)
PROTHROMBIN TIME: 24.8 s — AB (ref 11.6–15.2)

## 2014-03-26 LAB — MRSA PCR SCREENING: MRSA by PCR: NEGATIVE

## 2014-03-26 MED ORDER — DEXMEDETOMIDINE HCL IN NACL 400 MCG/100ML IV SOLN
0.1000 ug/kg/h | INTRAVENOUS | Status: DC
  Filled 2014-03-26: qty 100

## 2014-03-26 MED ORDER — INSULIN ASPART 100 UNIT/ML ~~LOC~~ SOLN: 0.0000 [IU] | Freq: Three times a day (TID) | SUBCUTANEOUS | Status: DC

## 2014-03-26 MED ORDER — POTASSIUM CHLORIDE 2 MEQ/ML IV SOLN
80.0000 meq | INTRAVENOUS | Status: DC
  Filled 2014-03-26: qty 40

## 2014-03-26 MED ORDER — MAGNESIUM SULFATE 50 % IJ SOLN
40.0000 meq | INTRAMUSCULAR | Status: DC
  Filled 2014-03-26: qty 10

## 2014-03-26 MED ORDER — SODIUM CHLORIDE 0.9 % IV SOLN
INTRAVENOUS | Status: DC
  Filled 2014-03-26: qty 30

## 2014-03-26 MED ORDER — CEFUROXIME SODIUM 1.5 G IJ SOLR
1.5000 g | INTRAMUSCULAR | Status: AC
  Administered 2014-03-27: 1.5 g via INTRAVENOUS
  Filled 2014-03-26: qty 1.5

## 2014-03-26 MED ORDER — SODIUM CHLORIDE 0.9 % IJ SOLN: 3.0000 mL | INTRAMUSCULAR | Status: DC | PRN

## 2014-03-26 MED ORDER — AMINOCAPROIC ACID 250 MG/ML IV SOLN
INTRAVENOUS | Status: DC
  Filled 2014-03-26: qty 40

## 2014-03-26 MED ORDER — PAPAVERINE HCL 30 MG/ML IJ SOLN
INTRAMUSCULAR | Status: DC
  Filled 2014-03-26: qty 2.5

## 2014-03-26 MED ORDER — CHLORHEXIDINE GLUCONATE CLOTH 2 % EX PADS
6.0000 | MEDICATED_PAD | Freq: Once | CUTANEOUS | Status: AC
  Administered 2014-03-27: 6 via TOPICAL

## 2014-03-26 MED ORDER — NITROGLYCERIN IN D5W 200-5 MCG/ML-% IV SOLN
2.0000 ug/min | INTRAVENOUS | Status: DC
  Filled 2014-03-26: qty 250

## 2014-03-26 MED ORDER — METOPROLOL TARTRATE 12.5 MG HALF TABLET
12.5000 mg | ORAL_TABLET | Freq: Two times a day (BID) | ORAL | Status: DC
  Administered 2014-03-26 – 2014-03-29 (×5): 12.5 mg via ORAL
  Filled 2014-03-26 (×9): qty 1

## 2014-03-26 MED ORDER — POTASSIUM CHLORIDE 10 MEQ/100ML IV SOLN
10.0000 meq | Freq: Once | INTRAVENOUS | Status: AC
  Administered 2014-03-26: 10 meq via INTRAVENOUS
  Filled 2014-03-26: qty 100

## 2014-03-26 MED ORDER — SODIUM CHLORIDE 0.9 % IV SOLN
INTRAVENOUS | Status: DC
  Filled 2014-03-26: qty 2.5

## 2014-03-26 MED ORDER — FUROSEMIDE 10 MG/ML IJ SOLN
40.0000 mg | Freq: Once | INTRAMUSCULAR | Status: AC
  Administered 2014-03-26: 40 mg via INTRAVENOUS
  Filled 2014-03-26: qty 4

## 2014-03-26 MED ORDER — POTASSIUM CHLORIDE CRYS ER 20 MEQ PO TBCR
40.0000 meq | EXTENDED_RELEASE_TABLET | Freq: Once | ORAL | Status: AC
  Administered 2014-03-26: 40 meq via ORAL
  Filled 2014-03-26: qty 2

## 2014-03-26 MED ORDER — CHLORHEXIDINE GLUCONATE CLOTH 2 % EX PADS
6.0000 | MEDICATED_PAD | Freq: Once | CUTANEOUS | Status: AC
  Administered 2014-03-26: 6 via TOPICAL

## 2014-03-26 MED ORDER — SODIUM CHLORIDE 0.9 % IJ SOLN
3.0000 mL | Freq: Two times a day (BID) | INTRAMUSCULAR | Status: DC
Start: 2014-03-26 — End: 2014-03-27
  Administered 2014-03-26: 3 mL via INTRAVENOUS

## 2014-03-26 MED ORDER — EPINEPHRINE HCL 1 MG/ML IJ SOLN
0.0000 ug/min | INTRAVENOUS | Status: DC
  Filled 2014-03-26: qty 4

## 2014-03-26 MED ORDER — PHENYLEPHRINE HCL 10 MG/ML IJ SOLN
30.0000 ug/min | INTRAMUSCULAR | Status: DC
  Filled 2014-03-26: qty 2

## 2014-03-26 MED ORDER — DEXTROSE 5 % IV SOLN
750.0000 mg | INTRAVENOUS | Status: DC
  Filled 2014-03-26: qty 750

## 2014-03-26 MED ORDER — DOPAMINE-DEXTROSE 3.2-5 MG/ML-% IV SOLN
0.0000 ug/kg/min | INTRAVENOUS | Status: DC
  Filled 2014-03-26: qty 250

## 2014-03-26 MED ORDER — ACETAMINOPHEN 325 MG PO TABS
650.0000 mg | ORAL_TABLET | Freq: Four times a day (QID) | ORAL | Status: DC | PRN
  Administered 2014-03-26: 650 mg via ORAL
  Filled 2014-03-26: qty 2

## 2014-03-26 MED ORDER — SODIUM CHLORIDE 0.9 % IV SOLN
1500.0000 mg | INTRAVENOUS | Status: DC
  Filled 2014-03-26: qty 1500

## 2014-03-26 MED ORDER — INSULIN ASPART 100 UNIT/ML ~~LOC~~ SOLN: 0.0000 [IU] | Freq: Every day | SUBCUTANEOUS | Status: DC

## 2014-03-26 NOTE — ED Notes (Signed)
Patient returned from X-ray 

## 2014-03-26 NOTE — H&P (Signed)
Primary cardiologist: Dr. Peter Martinique  Reason for admission: Shortness of breath, leg edema, pericardial effusion with tamponade physiology  Clinical Summary Anna Salinas is a 44 y.o.female with a history of bicuspid aortic valve and aortic stenosis status post St. Jude Regent mechanical AVR or Dr. Cyndia Bent in February of this year. She has been maintained on Coumadin and has done reasonably well until the last 7-10 days. She has been experiencing progressive leg edema associated with approximately 6 pound weight gain, also feeling of shortness of breath. She was seen in the office on February 24, at that time has been prescribed Lasix with marginal benefit. She came to the ER today with continued symptoms, noted to be tachycardic with heart rate between 680-321 bpm, systolic blood pressure in the 120s. Chest x-ray reported a moderate to large right pleural effusion and also moderate cardiomegaly. ECG showed sinus tachycardia with poor R-wave progression and low voltage in the limb leads. Comparison to prior chest x-rays shows significant progression in cardiac silhouette. Concern was that she had developed a pericardial effusion, and we requested a stat echocardiogram be performed. This study shows a large circumferential pericardial effusion with compression of the RV noted in the subcostal views, also associated with respiratory variation in mitral inflow greater than 50% consistent with tamponade physiology. Right pleural effusion also visualized.  She does not report any progressive chest pain, perhaps some intermittent tightness. No palpitations. She states that she has been compliant with her medications, INR is 2.2 on Coumadin.   Allergies  Allergen Reactions  . Glipizide Nausea And Vomiting and Other (See Comments)    GI upset  . Metoprolol Other (See Comments)    "Made me sick"  . Shellfish Allergy Swelling    Throat and eyes were swollen.  Had difficulty breathing.  Was hospitalized.       Home Medications No current facility-administered medications on file prior to encounter.   Current Outpatient Prescriptions on File Prior to Encounter  Medication Sig Dispense Refill  . Chlorpheniramine-DM (CORICIDIN HBP COUGH/COLD PO) Take 1 tablet by mouth as needed (for cough).    . furosemide (LASIX) 20 MG tablet Take 1 to 2 tablets by mouth daily as directed 45 tablet 3  . oxyCODONE (OXY IR/ROXICODONE) 5 MG immediate release tablet Take 1-2 tablets (5-10 mg total) by mouth every 6 (six) hours as needed for severe pain. 40 tablet 0  . Potassium Chloride ER 20 MEQ TBCR Take 20 mEq by mouth daily. 30 tablet 3  . warfarin (COUMADIN) 5 MG tablet Take 1 tablet (5 mg total) by mouth daily. Or as directed by the Coumadin Clinic (Patient taking differently: Take 5 mg by mouth daily. 03/17/14:  Pt states she takes Coumadin 5mg  on Monday then 2.5mg  all other days of the week.) 40 tablet 1  . metoprolol tartrate (LOPRESSOR) 25 MG tablet Take 1 tablet (25 mg total) by mouth 2 (two) times daily. (Patient taking differently: Take 12.5 mg by mouth 2 (two) times daily. Pt states Metoprolol is making her sick and dose has been reduced to 12.5mg  PO BID for now. MD to evaluate at next appointment.) 60 tablet 1     Past Medical History  Diagnosis Date  . Essential hypertension, benign   . Palpitations   . Aortic stenosis due to bicuspid aortic valve   . Type 2 diabetes mellitus     Past Surgical History  Procedure Laterality Date  . Cesarean section  2009  . Tubal ligation    .  Left and right heart catheterization with coronary angiogram N/A 01/25/2014    Procedure: LEFT AND RIGHT HEART CATHETERIZATION WITH CORONARY ANGIOGRAM;  Surgeon: Peter M Martinique, MD;  Location: [facility] CATH LAB;  Service: Cardiovascular;  Laterality: N/A;  . Cardiac catheterization    . Aortic valve replacement N/A 02/14/2014    Procedure: AORTIC VALVE REPLACEMENT (AVR);  Surgeon: Gaye Pollack, MD;  Location: [place];  Service: Open  Heart Surgery;  Laterality: N/A;  . Intraoperative transesophageal echocardiogram N/A 02/14/2014    Procedure: INTRAOPERATIVE TRANSESOPHAGEAL ECHOCARDIOGRAM;  Surgeon: Gaye Pollack, MD;  Location: [facility] OR;  Service: Open Heart Surgery;  Laterality: N/A;    Family History  Problem Relation Age of Onset  . Diabetes Mother   . Brain cancer Brother   . Cancer Maternal Grandmother     Social History Anna Salinas reports that she has never smoked. She has never used smokeless tobacco. Ms. Mione reports that she does not drink alcohol.  Review of Systems Complete review of systems negative except as otherwise outlined in the clinical summary and also the following. No fevers or chills. Stable appetite. No syncope.  Physical Examination Temp:  [98.3 F (36.8 C)] 98.3 F (36.8 C) (03/12 1031) Pulse Rate:  [106-114] 114 (03/12 1630) Resp:  [20-31] 31 (03/12 1630) BP: (101-133)/(50-86) 126/67 mmHg (03/12 1630) SpO2:  [96 %-100 %] 97 % (03/12 1630) Weight:  [241 lb 8 oz (109.544 kg)-242 lb (109.77 kg)] 241 lb 8 oz (109.544 kg) (03/12 1109) No intake or output data in the 24 hours ending 03/26/14 1700  Telemetry: Sinus tachycardia.  Obese woman, no acute distress. HEENT: Conjunctiva and lids normal, oropharynx clear. Neck: Supple, elevated JVP, no carotid bruits, no thyromegaly. Lungs: Decreased breath sounds right base, question Ewart's sign, nonlabored breathing at rest. Cardiac: Indistinct PMI, regular rate and rhythm, no S3, metallic click in S2. no pericardial rub. Abdomen: Soft, nontender, bowel sounds present, no guarding or rebound. Extremities: 1-2+ edema, distal pulses 2+. Skin: Warm and dry. Musculoskeletal: No kyphosis. Neuropsychiatric: Alert and oriented x3, affect grossly appropriate.   Lab Results  Basic Metabolic Panel:  Recent Labs Lab 03/26/14 1050  NA 138  K 2.9*  CL 99  CO2 29  GLUCOSE 178*  BUN <5*  CREATININE 0.72  CALCIUM 8.8    Liver Function  Tests:  Recent Labs Lab 03/26/14 1050  AST 41*  ALT 27  ALKPHOS 65  BILITOT 0.8  PROT 6.7  ALBUMIN 3.5    CBC:  Recent Labs Lab 03/26/14 1050  WBC 5.9  NEUTROABS 3.7  HGB 11.3*  HCT 36.5  MCV 84.1  PLT 229    Imaging CHEST 2 VIEW  COMPARISON: 03/23/2014  FINDINGS: The patient is status post median sternotomy and aortic valve replacement. The heart size is moderately enlarged. There is a moderate to large right pleural effusion which is unchanged from previous exam decrease in left pleural effusion. Atelectasis noted in the left base.  IMPRESSION: 1. Persistent right pleural effusion. 2. Interval improvement in left effusion.   Impression  1. Large circumferential pericardial effusion with echocardiographic evidence of tamponade physiology. She is tachycardic but not hypotensive at this time. She underwent St. Jude Regent mechanical AVR in February, has been compliant with Coumadin. INR 2.2.  2. History of bicuspid aortic valve with aortic stenosis status post mechanical AVR in February with Dr. Cyndia Bent as outlined.  3. Moderately large right pleural effusion.  4. Type 2 diabetes mellitus.  5. Essential hypertension.  Recommendations  Case discussed with patient and husband, also consulted Dr. Servando Snare on call for [facility] who is evaluating the patient now as well. We will hold Coumadin, she is being admitted to the ICU for further observation and management, plan therapeutic pericardial window and possibly also associated drainage of right pleural effusion with chest tube.  Satira Sark, M.D., F.A.C.C.

## 2014-03-26 NOTE — ED Notes (Signed)
MD at bedside. 

## 2014-03-26 NOTE — ED Notes (Signed)
Called Radiology for information on CXR results; they are going to fax them as it will not cross over in the system.

## 2014-03-26 NOTE — ED Notes (Signed)
Family at bedside. 

## 2014-03-26 NOTE — Progress Notes (Signed)
Patient ID: Anna Salinas, female   DOB: 07/30/1970, 44 y.o.   MRN: [id number]      [address]       [place], [address]             [phone]        [facility] Medical Record #[id number] Date of Birth: 1970/04/10  Referring: Dr Martinique Primary Care: Chevis Pretty, FNP  Chief Complaint:    Chief Complaint  Patient presents with  . Leg Swelling    History of Present Illness:     Patient is  44 y.o.female s/p  St. Jude [employer] mechanical AVR by  Dr. Cyndia Bent in February 1,2016 for  bicuspid aortic valve and aortic stenosis . She has been maintained on Coumadin, with inr as high as 5.5 . She had done well until 03/17/2014 when she started having increased SOB and pedal edema. She has been experiencing progressive leg edema associated with approximately 6 pound weight gain, also feeling of shortness of breath. She was seen in the office on February 24, at that time has been prescribed Lasix with marginal benefit. She came to the ER today with continued symptoms, noted to be tachycardic with heart rate between 621-308 bpm, systolic blood pressure in the 120s. Chest x-ray reported a moderate to large right pleural effusion and also moderate cardiomegaly. ECG showed sinus tachycardia with poor R-wave progression and low voltage in the limb leads.  This study shows a large circumferential pericardial effusion with compression of the RV noted in the subcostal views, also associated with respiratory variation in mitral inflow greater than 50% consistent with tamponade physiology. Right pleural effusion also visualized. The patient ate 3 hours ago and is drinking sprite now.  She does not report any progressive chest pain, perhaps some intermittent tightness. No palpitations.  INR is 2.2 on Coumadin.   Current Activity/ Functional Status: Patient is independent with mobility/ambulation, transfers, ADL's, IADL's.   Zubrod Score: At the time of surgery this  patient's 44 most appropriate activity status/level should be described as: []     0    Normal activity, no symptoms [x]     1    Restricted in physical strenuous activity but ambulatory, able to do out light work []     2    Ambulatory and capable of self care, unable to do work activities, up and about                 more than 50%  Of the time                            []     3    Only limited self care, in bed greater than 50% of waking hours []     4    Completely disabled, no self care, confined to bed or chair []     5    Moribund  Past Medical History  Diagnosis Date  . Essential hypertension, benign   . Palpitations   . Aortic stenosis due to bicuspid aortic valve   . Type 2 diabetes mellitus     Past Surgical History  Procedure Laterality Date  . Cesarean section  2009  . Tubal ligation    . Left and right heart catheterization with coronary angiogram N/A 01/25/2014    Procedure: LEFT AND RIGHT HEART CATHETERIZATION WITH CORONARY ANGIOGRAM;  Surgeon: Peter M Martinique, MD;  Location: [facility] CATH LAB;  Service: Cardiovascular;  Laterality: N/A;  . Cardiac catheterization    . Aortic valve replacement N/A 02/14/2014    Procedure: AORTIC VALVE REPLACEMENT (AVR);  Surgeon: Gaye Pollack, MD;  Location: [facility];  Service: Open Heart Surgery;  Laterality: N/A;  . Intraoperative transesophageal echocardiogram N/A 02/14/2014    Procedure: INTRAOPERATIVE TRANSESOPHAGEAL ECHOCARDIOGRAM;  Surgeon: Gaye Pollack, MD;  Location: [facility] OR;  Service: Open Heart Surgery;  Laterality: N/A;    History  Smoking status  . Never Smoker   Smokeless tobacco  . Never Used   History  Alcohol Use No    History   Social History  . Marital Status: Married    Spouse Name: N/A  . Number of Children: 3  . Years of Education: N/A   Occupational History  . cashier    Social History Main Topics  . Smoking status: Never Smoker   . Smokeless tobacco: Never Used  . Alcohol Use: No  . Drug Use: No  . Sexual  Activity: Not on file   Other Topics Concern  . Not on file   Social History Narrative    Allergies  Allergen Reactions  . Glipizide Nausea And Vomiting and Other (See Comments)    GI upset  . Metoprolol Other (See Comments)    "Made me sick"  . Shellfish Allergy Swelling    Throat and eyes were swollen.  Had difficulty breathing.  Was hospitalized.      No current facility-administered medications for this encounter.   Current Outpatient Prescriptions  Medication Sig Dispense Refill  . acetaminophen (TYLENOL) 325 MG tablet Take 650 mg by mouth every 6 (six) hours as needed (pain).    . Chlorpheniramine-DM (CORICIDIN HBP COUGH/COLD PO) Take 1 tablet by mouth as needed (for cough).    . furosemide (LASIX) 20 MG tablet Take 1 to 2 tablets by mouth daily as directed 45 tablet 3  . oxyCODONE (OXY IR/ROXICODONE) 5 MG immediate release tablet Take 1-2 tablets (5-10 mg total) by mouth every 6 (six) hours as needed for severe pain. 40 tablet 0  . Potassium Chloride ER 20 MEQ TBCR Take 20 mEq by mouth daily. 30 tablet 3  . warfarin (COUMADIN) 5 MG tablet Take 1 tablet (5 mg total) by mouth daily. Or as directed by the [facility] (Patient taking differently: Take 5 mg by mouth daily. 03/17/14:  Pt states she takes Coumadin 5mg  on Monday then 2.5mg  all other days of the week.) 40 tablet 1  . metoprolol tartrate (LOPRESSOR) 25 MG tablet Take 1 tablet (25 mg total) by mouth 2 (two) times daily. (Patient taking differently: Take 12.5 mg by mouth 2 (two) times daily. Pt states Metoprolol is making her sick and dose has been reduced to 12.5mg  PO BID for now. MD to evaluate at next appointment.) 60 tablet 1     Family History  Problem Relation Age of Onset  . Diabetes Mother   . Brain cancer Brother   . Cancer Maternal Grandmother      Review of Systems:     Cardiac Review of Systems: Y or N  Chest Pain [  n  ]  Resting SOB [  y ] Exertional SOB  [  y]  Orthopnea Blue.Reese  ]   Pedal Edema Blue.Reese    ]    Palpitations [  ] Syncope  [ n ]   Presyncope [  lightheaded ]  General Review of Systems: [Y] = yes [  ]=no Constitional:  recent weight change [  ]; anorexia [  ]; fatigue [  ]; nausea [  ]; night sweats [  ]; fever [ n ]; or chills [ n ]                                                               Dental: poor dentition[  ]; Last Dentist visit:   Eye : blurred vision [  ]; diplopia [   ]; vision changes [  ];  Amaurosis fugax[  ]; Resp: cough [  ];  wheezing[  ];  hemoptysis[  ]; shortness of breath[y  ]; paroxysmal nocturnal dyspnea[  ]; dyspnea on exertion[  ]; or orthopnea[  ];  GI:  gallstones[  ], vomiting[  ];  dysphagia[  ]; melena[  ];  hematochezia [  ]; heartburn[  ];   Hx of  Colonoscopy[  ]; GU: kidney stones [  ]; hematuria[  ];   dysuria [  ];  nocturia[  ];  history of     obstruction [  ]; urinary frequency [  ]             Skin: rash, swelling[  ];, hair loss[  ];  peripheral edema[  ];  or itching[  ]; Musculosketetal: myalgias[  ];  joint swelling[  ];  joint erythema[  ];  joint pain[  ];  back pain[  ];  Heme/Lymph: bruising[  ];  bleeding[  ];  anemia[  ];  Neuro: TIA[  ];  headaches[  ];  stroke[  ];  vertigo[  ];  seizures[  ];   paresthesias[  ];  difficulty walking[ n ];  Psych:depression[  ]; anxiety[  ];  Endocrine: diabetes[  ];  thyroid dysfunction[  ];  Immunizations: Flu Blue.Reese  ]; Pneumococcal[ n ];  Other:  Physical Exam: BP 126/67 mmHg  Pulse 114  Temp(Src) 98.3 F (36.8 C) (Oral)  Resp 31  Ht 5\' 7"  (1.702 m)  Wt 241 lb 8 oz (109.544 kg)  BMI 37.82 kg/m2  SpO2 97%  LMP 03/11/2014   General appearance: alert, cooperative, appears stated age, no distress and moderately obese Head: Normocephalic, without obvious abnormality, atraumatic Neck: no adenopathy, no carotid bruit, no JVD, supple, symmetrical, trachea midline and thyroid not enlarged, symmetric, no tenderness/mass/nodules Lymph nodes: Cervical, supraclavicular, and axillary nodes  normal. Resp: diminished breath sounds RLL Back: symmetric, no curvature. ROM normal. No CVA tenderness. Cardio: ejection click present and distant heart tones GI: soft, non-tender; bowel sounds normal; no masses,  no organomegaly Extremities: extremities normal, atraumatic, no cyanosis or edema and Homans sign is negative, no sign of DVT Neurologic: Grossly normal  Diagnostic Studies & Laboratory data:     Recent Radiology Findings:   Dg Chest 2 View  03/26/2014   CLINICAL DATA:  Aortic valve replacement on have door a first.  EXAM: CHEST  2 VIEW  COMPARISON:  03/23/2014  FINDINGS: The patient is status post median sternotomy and aortic valve replacement. The heart size is moderately enlarged. There is a moderate to large right pleural effusion which is unchanged from previous exam decrease in left pleural effusion. Atelectasis noted in the left base.  IMPRESSION: 1. Persistent right pleural effusion. 2. Interval improvement in left effusion.  Electronically Signed   By: Kerby Moors M.D.   On: 03/26/2014 12:12    I have independently reviewed the above radiologic studies.  I have independently reviewed the echo and reviewed the findings with the  patient .  Recent Lab Findings: Lab Results  Component Value Date   WBC 5.9 03/26/2014   HGB 11.3* 03/26/2014   HCT 36.5 03/26/2014   PLT 229 03/26/2014   GLUCOSE 178* 03/26/2014   ALT 27 03/26/2014   AST 41* 03/26/2014   NA 138 03/26/2014   K 2.9* 03/26/2014   CL 99 03/26/2014   CREATININE 0.72 03/26/2014   BUN <5* 03/26/2014   CO2 29 03/26/2014   INR 2.22* 03/26/2014   HGBA1C 10.3* 02/10/2014      Assessment / Plan:    s/p valve replacement  6 weeks ago on coumadin with large pericardial effusion and moderate right pleural effusion.  Will proceed with drainage with subxiphoid window in am, after npo and placement of right chest tube, this would offer better drainage then cath lab drainage  The goals risks and alternatives of  the planned surgical procedure subxiphoid window  and placement of right chest tube  have been discussed with the patient in detail. The risks of the procedure including death, infection, stroke, myocardial infarction, bleeding, blood transfusion have all been discussed specifically.  I have quoted [name] a 2% of perioperative mortality and a complication rate as high as 20 %. The patient's questions have been answered.Anna Salinas is willing  to proceed with the planned procedure.  I  spent 30 minutes counseling the patient face to face and 50% or more the  time was spent in counseling and coordination of care. The total time spent in the appointment was 45 minutes.  Grace Isaac MD      [address] [place] [address] Office [phone]   Beeper [phone]  03/26/2014 5:05 PM

## 2014-03-26 NOTE — Progress Notes (Signed)
Name: MARKISHA MEDING MRN: [id number] DOB: July 22, 1970  ELECTRONIC ICU PHYSICIAN NOTE  Problem:  Tamponade p AVR Feb 2016 admitted by Cards for window 03/27/14 - also has moderate R pleural effusion   She is comfortable on RA and hemodymically stable but as tamponade physiology on Echo done today   No CCM interventions anticipated/ will continue to monitor      Christinia Gully 03/26/2014, 6:42 PM

## 2014-03-26 NOTE — ED Notes (Signed)
Echo tech at bedside.

## 2014-03-26 NOTE — Plan of Care (Signed)
Problem: Consults Goal: General Surgical Patient Education (See Patient Education module for education specifics) Outcome: Completed/Met Date Met:  03/26/14 Pt s/p AVR 02/14/14, pericardial window procedure discussed with by Dr Servando Snare. Questions answered by staff, emotional support provided. Goal: Diabetes Guidelines if Diabetic/Glucose > 140 If diabetic or lab glucose is > 140 mg/dl - Initiate Diabetes/Hyperglycemia Guidelines & Document Interventions  Outcome: Completed/Met Date Met:  03/26/14 A1C drawn andf pending, CBG AC/HS ordered.

## 2014-03-26 NOTE — Progress Notes (Signed)
  Echocardiogram 2D Echocardiogram has been performed.  Anna Salinas 03/26/2014, 5:39 PM

## 2014-03-26 NOTE — ED Notes (Signed)
Patient transported to X-ray 

## 2014-03-26 NOTE — ED Notes (Signed)
Dr.Mcdowell paged for echo tech

## 2014-03-26 NOTE — ED Notes (Signed)
Pt presents to department for evaluation of swelling to bilateral lower extremities. Also states SOB when laying flat and on exertion. Respirations unlabored. Pt is alert and oriented x4. Denies chest pain. History of open heart surgery in February.

## 2014-03-26 NOTE — ED Provider Notes (Addendum)
CSN: [id number]     Arrival date & time 03/26/14  1017 History   First MD Initiated Contact with Patient 03/26/14 1058     Chief Complaint  Patient presents with  . Leg Swelling     (Consider location/radiation/quality/duration/timing/severity/associated sxs/prior Treatment) The history is provided by the patient.  pt with hx bicuspid aortic valve, aortic stenosis, s/p St Jude aortic valve replacement surgery 02/14/13, c/o bilateral, symmetric lower leg swelling for the past 1-2 weeks. Gradual onset. Persistent. States 3 days ago saw cardiologist for same, metoprolol was d/cd, and was placed on diuretic medication then. States since then, swelling no better, ?mildly worse. No hx dvt or pe. No leg or calf pain. No cp or sob. Pt states 1 week post surgery did develop a cough, while cough is better than initial, it has persisted. Non productive. No cp or discomfort. No fever or chills.       Past Medical History  Diagnosis Date  . Essential hypertension, benign   . Palpitations   . Prediabetes   . Aortic stenosis due to bicuspid aortic valve   . Heart murmur   . Diabetes mellitus without complication    Past Surgical History  Procedure Laterality Date  . Cesarean section  2009  . Tubal ligation    . Left and right heart catheterization with coronary angiogram N/A 01/25/2014    Procedure: LEFT AND RIGHT HEART CATHETERIZATION WITH CORONARY ANGIOGRAM;  Surgeon: Peter M Martinique, MD;  Location: [facility] CATH LAB;  Service: Cardiovascular;  Laterality: N/A;  . Cardiac catheterization    . Aortic valve replacement N/A 02/14/2014    Procedure: AORTIC VALVE REPLACEMENT (AVR);  Surgeon: Gaye Pollack, MD;  Location: [facility];  Service: Open Heart Surgery;  Laterality: N/A;  . Intraoperative transesophageal echocardiogram N/A 02/14/2014    Procedure: INTRAOPERATIVE TRANSESOPHAGEAL ECHOCARDIOGRAM;  Surgeon: Gaye Pollack, MD;  Location: [facility] OR;  Service: Open Heart Surgery;  Laterality: N/A;   Family History   Problem Relation Age of Onset  . Diabetes Mother   . Brain cancer Brother   . Cancer Maternal Grandmother    History  Substance Use Topics  . Smoking status: Never Smoker   . Smokeless tobacco: Never Used  . Alcohol Use: No   OB History    No data available     Review of Systems  Constitutional: Negative for fever and chills.  HENT: Negative for sore throat.   Eyes: Negative for redness.  Respiratory: Positive for cough. Negative for shortness of breath.   Cardiovascular: Positive for leg swelling. Negative for chest pain.  Gastrointestinal: Negative for vomiting, abdominal pain and diarrhea.  Genitourinary: Negative for dysuria and flank pain.  Musculoskeletal: Negative for back pain and neck pain.  Skin: Negative for rash.  Neurological: Negative for headaches.  Hematological: Does not bruise/bleed easily.  Psychiatric/Behavioral: Negative for confusion.      Allergies  Glipizide and Shellfish allergy  Home Medications   Prior to Admission medications   Medication Sig Start Date End Date Taking? Authorizing Provider  Chlorpheniramine-DM (CORICIDIN HBP COUGH/COLD PO) Take 1 tablet by mouth as needed (for cough).    Historical Provider, MD  furosemide (LASIX) 20 MG tablet Take 1 to 2 tablets by mouth daily as directed 03/23/14   Peter M Martinique, MD  metoprolol tartrate (LOPRESSOR) 25 MG tablet Take 1 tablet (25 mg total) by mouth 2 (two) times daily. Patient taking differently: Take 12.5 mg by mouth 2 (two) times daily. Pt states Metoprolol  is making her sick and dose has been reduced to 12.5mg  PO BID for now. MD to evaluate at next appointment. 02/18/14   Coolidge Breeze, PA-C  oxyCODONE (OXY IR/ROXICODONE) 5 MG immediate release tablet Take 1-2 tablets (5-10 mg total) by mouth every 6 (six) hours as needed for severe pain. 03/14/14   Melrose Nakayama, MD  Potassium Chloride ER 20 MEQ TBCR Take 20 mEq by mouth daily. 03/23/14   Peter M Martinique, MD  warfarin (COUMADIN) 5 MG  tablet Take 1 tablet (5 mg total) by mouth daily. Or as directed by the [facility] Patient taking differently: Take 5 mg by mouth daily. 03/17/14:  Pt states she takes Coumadin 5mg  on Monday then 2.5mg  all other days of the week. 02/18/14   Coolidge Breeze, PA-C   BP 133/84 mmHg  Pulse 113  Temp(Src) 98.3 F (36.8 C) (Oral)  Resp 20  Ht 5\' 7"  (1.702 m)  Wt 242 lb (109.77 kg)  BMI 37.89 kg/m2  SpO2 100%  LMP 03/11/2014 Physical Exam  Constitutional: She appears well-developed and well-nourished. No distress.  HENT:  Mouth/Throat: Oropharynx is clear and moist.  Eyes: Conjunctivae are normal. No scleral icterus.  Neck: Neck supple. No JVD present. No tracheal deviation present.  Cardiovascular: Normal rate, regular rhythm and intact distal pulses.   +crisp mechanical heart valve sound.   Pulmonary/Chest: Effort normal. No respiratory distress.  Decreased bs right base.   Abdominal: Soft. Normal appearance and bowel sounds are normal. She exhibits no distension. There is no tenderness.  Genitourinary:  No cva tenderness  Musculoskeletal: She exhibits no edema.  Moderate bil lower leg edema to knees. Symmetric. No calf pain or tenderness.   Neurological: She is alert.  Skin: Skin is warm and dry. No rash noted. She is not diaphoretic.  Psychiatric: She has a normal mood and affect.  Nursing note and vitals reviewed.   ED Course  Procedures (including critical care time) Labs Review  Results for orders placed or performed during the hospital encounter of 03/26/14  CBC with Differential  Result Value Ref Range   WBC 5.9 4.0 - 10.5 K/uL   RBC 4.34 3.87 - 5.11 MIL/uL   Hemoglobin 11.3 (L) 12.0 - 15.0 g/dL   HCT 36.5 36.0 - 46.0 %   MCV 84.1 78.0 - 100.0 fL   MCH 26.0 26.0 - 34.0 pg   MCHC 31.0 30.0 - 36.0 g/dL   RDW 14.8 11.5 - 15.5 %   Platelets 229 150 - 400 K/uL   Neutrophils Relative % 62 43 - 77 %   Neutro Abs 3.7 1.7 - 7.7 K/uL   Lymphocytes Relative 25 12 - 46 %    Lymphs Abs 1.5 0.7 - 4.0 K/uL   Monocytes Relative 11 3 - 12 %   Monocytes Absolute 0.6 0.1 - 1.0 K/uL   Eosinophils Relative 2 0 - 5 %   Eosinophils Absolute 0.1 0.0 - 0.7 K/uL   Basophils Relative 0 0 - 1 %   Basophils Absolute 0.0 0.0 - 0.1 K/uL  Comprehensive metabolic panel  Result Value Ref Range   Sodium 138 135 - 145 mmol/L   Potassium 2.9 (L) 3.5 - 5.1 mmol/L   Chloride 99 96 - 112 mmol/L   CO2 29 19 - 32 mmol/L   Glucose, Bld 178 (H) 70 - 99 mg/dL   BUN <5 (L) 6 - 23 mg/dL   Creatinine, Ser 0.72 0.50 - 1.10 mg/dL   Calcium 8.8  8.4 - 10.5 mg/dL   Total Protein 6.7 6.0 - 8.3 g/dL   Albumin 3.5 3.5 - 5.2 g/dL   AST 41 (H) 0 - 37 U/L   ALT 27 0 - 35 U/L   Alkaline Phosphatase 65 39 - 117 U/L   Total Bilirubin 0.8 0.3 - 1.2 mg/dL   GFR calc non Af Amer >90 >90 mL/min   GFR calc Af Amer >90 >90 mL/min   Anion gap 10 5 - 15  Brain natriuretic peptide  Result Value Ref Range   B Natriuretic Peptide 272.0 (H) 0.0 - 100.0 pg/mL  Protime-INR  Result Value Ref Range   Prothrombin Time 24.8 (H) 11.6 - 15.2 seconds   INR 2.22 (H) 0.00 - 1.49  I-Stat Troponin, ED (not at [facility])  Result Value Ref Range   Troponin i, poc 0.01 0.00 - 0.08 ng/mL   Comment 3           Dg Chest 2 View  03/26/2014   CLINICAL DATA:  Aortic valve replacement on have door a first.  EXAM: CHEST  2 VIEW  COMPARISON:  03/23/2014  FINDINGS: The patient is status post median sternotomy and aortic valve replacement. The heart size is moderately enlarged. There is a moderate to large right pleural effusion which is unchanged from previous exam decrease in left pleural effusion. Atelectasis noted in the left base.  IMPRESSION: 1. Persistent right pleural effusion. 2. Interval improvement in left effusion.   Electronically Signed   By: Kerby Moors M.D.   On: 03/26/2014 12:12       EKG Interpretation   Date/Time:  Saturday March 26 2014 10:37:42 EST Ventricular Rate:  107 PR Interval:  146 QRS Duration:  74 QT Interval:  376 QTC Calculation: 501 R Axis:   100 Text Interpretation:  Sinus tachycardia Rightward axis Nonspecific T wave  abnormality Confirmed by Ashok Cordia  MD, Lennette Bihari ([id number]) on 03/26/2014 1:01:57 PM      MDM   Iv ns. Labs.  Reviewed nursing notes and prior charts for additional history.   Lasix iv, kcl iv and po. Given appearance cardiac silhoutte on current xray compared w those from Feb, ?pericardial effusion.   Discussed pt with Dr Servando Snare, on call for cvts - he indicates to consult cardiology, and from surgical standpoint to follow up with Dr Cyndia Bent as outpt as planned - he indicates if effusion fails to improve w medical rx, they can discuss thoracentesis then.  Cardiology consulted.   Answering service w delays reaching cardiology.  Cardiology repaged.  Discussed w Dr Domenic Polite, incl concern pericardial effusion - he requests ECHO, they will call in tech, and he will see in ED.  Dispo per cardiology.   Signed out to Dr Doy Mince that echo and cardiology eval pending, anticipate cardiology admit.       Lajean Saver, MD 03/26/14 [phone]

## 2014-03-27 ENCOUNTER — Inpatient Hospital Stay (HOSPITAL_COMMUNITY): Payer: BLUE CROSS/BLUE SHIELD

## 2014-03-27 ENCOUNTER — Inpatient Hospital Stay (HOSPITAL_COMMUNITY): Payer: BLUE CROSS/BLUE SHIELD | Admitting: Anesthesiology

## 2014-03-27 ENCOUNTER — Encounter (HOSPITAL_COMMUNITY)
Admission: EM | Disposition: A | Discharge status: Home / Self Care | Payer: Self-pay | Source: Home / Self Care | Attending: Cardiology

## 2014-03-27 ENCOUNTER — Encounter (HOSPITAL_COMMUNITY): Payer: Self-pay | Admitting: Anesthesiology

## 2014-03-27 DIAGNOSIS — Z954 Presence of other heart-valve replacement: Secondary | ICD-10-CM

## 2014-03-27 HISTORY — PX: SUBXYPHOID PERICARDIAL WINDOW: SHX5075

## 2014-03-27 HISTORY — PX: CHEST TUBE INSERTION: SHX231

## 2014-03-27 HISTORY — PX: PLEURAL EFFUSION DRAINAGE: SHX5099

## 2014-03-27 HISTORY — PX: PERICARDIAL FLUID DRAINAGE: SHX5100

## 2014-03-27 LAB — BASIC METABOLIC PANEL
Anion gap: 7 (ref 5–15)
BUN: <5 mg/dL — ABNORMAL LOW (ref 6–23)
CO2: 28 mmol/L (ref 19–32)
Calcium: 8.4 mg/dL (ref 8.4–10.5)
Chloride: 102 mmol/L (ref 96–112)
Creatinine, Ser: 0.7 mg/dL (ref 0.50–1.10)
GFR calc Af Amer: >90 mL/min (ref >90)
GFR calc non Af Amer: >90 mL/min (ref >90)
Glucose, Bld: 173 mg/dL — ABNORMAL HIGH (ref 70–99)
Potassium: 3.1 mmol/L — ABNORMAL LOW (ref 3.5–5.1)
Sodium: 137 mmol/L (ref 135–145)

## 2014-03-27 LAB — GLUCOSE, CAPILLARY
GLUCOSE-CAPILLARY: 176 mg/dL — AB (ref 70–99)
Glucose-Capillary: 127 mg/dL — ABNORMAL HIGH (ref 70–99)
Glucose-Capillary: 133 mg/dL — ABNORMAL HIGH (ref 70–99)
Glucose-Capillary: 135 mg/dL — ABNORMAL HIGH (ref 70–99)

## 2014-03-27 LAB — CBC
HCT: 33.3 % — ABNORMAL LOW (ref 36.0–46.0)
Hemoglobin: 10.1 g/dL — ABNORMAL LOW (ref 12.0–15.0)
MCH: 25.6 pg — ABNORMAL LOW (ref 26.0–34.0)
MCHC: 30.3 g/dL (ref 30.0–36.0)
MCV: 84.3 fL (ref 78.0–100.0)
Platelets: 188 10*3/uL (ref 150–400)
RBC: 3.95 MIL/uL (ref 3.87–5.11)
RDW: 15 % (ref 11.5–15.5)
WBC: 5.8 10*3/uL (ref 4.0–10.5)

## 2014-03-27 LAB — BODY FLUID CELL COUNT WITH DIFFERENTIAL
EOS FL: 0 %
LYMPHS FL: 91 %
Monocyte-Macrophage-Serous Fluid: 8 % — ABNORMAL LOW (ref 50–90)
NEUTROPHIL FLUID: 1 % (ref 0–25)
Total Nucleated Cell Count, Fluid: 765 cu mm (ref 0–1000)

## 2014-03-27 LAB — GLUCOSE, SEROUS FLUID: Glucose, Fluid: 170 mg/dL

## 2014-03-27 LAB — PROTIME-INR
INR: 1.97 — ABNORMAL HIGH (ref 0.00–1.49)
Prothrombin Time: 22.6 seconds — ABNORMAL HIGH (ref 11.6–15.2)

## 2014-03-27 LAB — PROTEIN, BODY FLUID: Total protein, fluid: 5.3 g/dL

## 2014-03-27 LAB — TYPE AND SCREEN
ABO/RH(D): A POS
Antibody Screen: NEGATIVE

## 2014-03-27 LAB — LACTATE DEHYDROGENASE, PLEURAL OR PERITONEAL FLUID: LD, Fluid: 153 U/L — ABNORMAL HIGH (ref 3–23)

## 2014-03-27 SURGERY — CREATION, PERICARDIAL WINDOW, SUBXIPHOID APPROACH
Anesthesia: General | Site: Chest | Laterality: Right

## 2014-03-27 MED ORDER — HYDROMORPHONE HCL 1 MG/ML IJ SOLN
0.2500 mg | INTRAMUSCULAR | Status: DC | PRN
  Administered 2014-03-27 (×3): 0.5 mg via INTRAVENOUS

## 2014-03-27 MED ORDER — ETOMIDATE 2 MG/ML IV SOLN
INTRAVENOUS | Status: AC
  Filled 2014-03-27: qty 10

## 2014-03-27 MED ORDER — OXYCODONE HCL 5 MG PO TABS
5.0000 mg | ORAL_TABLET | ORAL | Status: DC | PRN
  Administered 2014-03-27 – 2014-03-28 (×4): 10 mg via ORAL
  Administered 2014-03-29: 5 mg via ORAL
  Filled 2014-03-27 (×3): qty 2
  Filled 2014-03-27: qty 1
  Filled 2014-03-27: qty 2

## 2014-03-27 MED ORDER — NEOSTIGMINE METHYLSULFATE 10 MG/10ML IV SOLN
INTRAVENOUS | Status: DC | PRN
  Administered 2014-03-27: 2 mg via INTRAVENOUS

## 2014-03-27 MED ORDER — SODIUM CHLORIDE 0.9 % IJ SOLN
10.0000 mL | Freq: Two times a day (BID) | INTRAMUSCULAR | Status: DC
  Administered 2014-03-27 – 2014-03-29 (×4): 10 mL
  Administered 2014-03-29: 20 mL
  Administered 2014-03-30: 10 mL

## 2014-03-27 MED ORDER — NEOSTIGMINE METHYLSULFATE 10 MG/10ML IV SOLN
INTRAVENOUS | Status: AC
  Filled 2014-03-27: qty 1

## 2014-03-27 MED ORDER — ETOMIDATE 2 MG/ML IV SOLN
INTRAVENOUS | Status: DC | PRN
  Administered 2014-03-27: 10 mg via INTRAVENOUS

## 2014-03-27 MED ORDER — SCOPOLAMINE 1 MG/3DAYS TD PT72
MEDICATED_PATCH | TRANSDERMAL | Status: DC | PRN
  Administered 2014-03-27: 1 via TRANSDERMAL

## 2014-03-27 MED ORDER — OXYCODONE HCL 5 MG PO TABS: 5.0000 mg | ORAL_TABLET | Freq: Four times a day (QID) | ORAL | Status: DC | PRN

## 2014-03-27 MED ORDER — WARFARIN SODIUM 5 MG PO TABS
5.0000 mg | ORAL_TABLET | Freq: Every day | ORAL | Status: DC
  Administered 2014-03-27 – 2014-03-29 (×3): 5 mg via ORAL
  Filled 2014-03-27 (×4): qty 1

## 2014-03-27 MED ORDER — GLYCOPYRROLATE 0.2 MG/ML IJ SOLN
INTRAMUSCULAR | Status: AC
  Filled 2014-03-27: qty 2

## 2014-03-27 MED ORDER — ROCURONIUM BROMIDE 50 MG/5ML IV SOLN
INTRAVENOUS | Status: AC
  Filled 2014-03-27: qty 1

## 2014-03-27 MED ORDER — ONDANSETRON HCL 4 MG/2ML IJ SOLN
INTRAMUSCULAR | Status: AC
  Filled 2014-03-27: qty 2

## 2014-03-27 MED ORDER — ACETAMINOPHEN 500 MG PO TABS
1000.0000 mg | ORAL_TABLET | Freq: Four times a day (QID) | ORAL | Status: DC
  Administered 2014-03-27 – 2014-03-28 (×4): 1000 mg via ORAL
  Filled 2014-03-27 (×8): qty 2

## 2014-03-27 MED ORDER — FENTANYL CITRATE 0.05 MG/ML IJ SOLN
INTRAMUSCULAR | Status: AC
  Filled 2014-03-27: qty 5

## 2014-03-27 MED ORDER — ONDANSETRON HCL 4 MG/2ML IJ SOLN
INTRAMUSCULAR | Status: DC | PRN
  Administered 2014-03-27: 4 mg via INTRAVENOUS

## 2014-03-27 MED ORDER — CEFUROXIME SODIUM 1.5 G IJ SOLR
1.5000 g | Freq: Two times a day (BID) | INTRAMUSCULAR | Status: AC
  Administered 2014-03-27 – 2014-03-28 (×2): 1.5 g via INTRAVENOUS
  Filled 2014-03-27 (×2): qty 1.5

## 2014-03-27 MED ORDER — POTASSIUM CHLORIDE 10 MEQ/50ML IV SOLN
10.0000 meq | Freq: Every day | INTRAVENOUS | Status: DC | PRN
  Administered 2014-03-28: 10 meq via INTRAVENOUS
  Filled 2014-03-27 (×3): qty 50

## 2014-03-27 MED ORDER — PROPOFOL 10 MG/ML IV BOLUS
INTRAVENOUS | Status: DC | PRN
  Administered 2014-03-27: 70 mg via INTRAVENOUS

## 2014-03-27 MED ORDER — METOPROLOL TARTRATE 12.5 MG HALF TABLET: 12.5000 mg | ORAL_TABLET | Freq: Two times a day (BID) | ORAL | Status: DC

## 2014-03-27 MED ORDER — KCL IN DEXTROSE-NACL 20-5-0.45 MEQ/L-%-% IV SOLN
INTRAVENOUS | Status: DC
  Administered 2014-03-27: 15 mL/h via INTRAVENOUS
  Administered 2014-03-27: 11:00:00 via INTRAVENOUS
  Filled 2014-03-27: qty 1000

## 2014-03-27 MED ORDER — SENNOSIDES-DOCUSATE SODIUM 8.6-50 MG PO TABS
1.0000 | ORAL_TABLET | Freq: Every day | ORAL | Status: DC
  Administered 2014-03-27 – 2014-03-28 (×2): 1 via ORAL
  Filled 2014-03-27 (×6): qty 1

## 2014-03-27 MED ORDER — ONDANSETRON HCL 4 MG/2ML IJ SOLN: 4.0000 mg | Freq: Four times a day (QID) | INTRAMUSCULAR | Status: DC | PRN

## 2014-03-27 MED ORDER — WARFARIN - PHYSICIAN DOSING INPATIENT: Freq: Every day | Status: DC

## 2014-03-27 MED ORDER — 0.9 % SODIUM CHLORIDE (POUR BTL) OPTIME
TOPICAL | Status: DC | PRN
  Administered 2014-03-27: 1000 mL

## 2014-03-27 MED ORDER — LIDOCAINE HCL (CARDIAC) 20 MG/ML IV SOLN
INTRAVENOUS | Status: DC | PRN
  Administered 2014-03-27: 100 mg via INTRAVENOUS

## 2014-03-27 MED ORDER — ALBUMIN HUMAN 5 % IV SOLN
INTRAVENOUS | Status: DC | PRN
  Administered 2014-03-27: 08:00:00 via INTRAVENOUS

## 2014-03-27 MED ORDER — HYDROMORPHONE HCL 1 MG/ML IJ SOLN
INTRAMUSCULAR | Status: AC
  Administered 2014-03-27: 0.5 mg via INTRAVENOUS
  Filled 2014-03-27: qty 1

## 2014-03-27 MED ORDER — FENTANYL CITRATE 0.05 MG/ML IJ SOLN
INTRAMUSCULAR | Status: DC | PRN
  Administered 2014-03-27: 50 ug via INTRAVENOUS
  Administered 2014-03-27 (×2): 100 ug via INTRAVENOUS

## 2014-03-27 MED ORDER — ONDANSETRON HCL 4 MG/2ML IJ SOLN: 4.0000 mg | Freq: Once | INTRAMUSCULAR | Status: DC | PRN

## 2014-03-27 MED ORDER — SODIUM CHLORIDE 0.9 % IJ SOLN: 10.0000 mL | INTRAMUSCULAR | Status: DC | PRN

## 2014-03-27 MED ORDER — MIDAZOLAM HCL 2 MG/2ML IJ SOLN
INTRAMUSCULAR | Status: AC
  Filled 2014-03-27: qty 2

## 2014-03-27 MED ORDER — SCOPOLAMINE 1 MG/3DAYS TD PT72
MEDICATED_PATCH | TRANSDERMAL | Status: AC
  Administered 2014-03-27: 12:00:00
  Filled 2014-03-27: qty 1

## 2014-03-27 MED ORDER — BISACODYL 5 MG PO TBEC
10.0000 mg | DELAYED_RELEASE_TABLET | Freq: Every day | ORAL | Status: DC
  Administered 2014-03-28 – 2014-03-29 (×2): 10 mg via ORAL
  Filled 2014-03-27 (×2): qty 2

## 2014-03-27 MED ORDER — HYDROMORPHONE HCL 1 MG/ML IJ SOLN
INTRAMUSCULAR | Status: AC
  Administered 2014-03-27: 10:00:00
  Filled 2014-03-27: qty 1

## 2014-03-27 MED ORDER — LIDOCAINE HCL (CARDIAC) 20 MG/ML IV SOLN
INTRAVENOUS | Status: AC
  Filled 2014-03-27: qty 5

## 2014-03-27 MED ORDER — CETYLPYRIDINIUM CHLORIDE 0.05 % MT LIQD
7.0000 mL | Freq: Two times a day (BID) | OROMUCOSAL | Status: DC
  Administered 2014-03-27 – 2014-03-30 (×7): 7 mL via OROMUCOSAL

## 2014-03-27 MED ORDER — TRAMADOL HCL 50 MG PO TABS
50.0000 mg | ORAL_TABLET | Freq: Four times a day (QID) | ORAL | Status: DC | PRN
  Administered 2014-03-30: 100 mg via ORAL
  Filled 2014-03-27: qty 2

## 2014-03-27 MED ORDER — INSULIN ASPART 100 UNIT/ML ~~LOC~~ SOLN
0.0000 [IU] | SUBCUTANEOUS | Status: DC
  Administered 2014-03-27: 4 [IU] via SUBCUTANEOUS
  Administered 2014-03-27 (×2): 2 [IU] via SUBCUTANEOUS

## 2014-03-27 MED ORDER — ACETAMINOPHEN 160 MG/5ML PO SOLN: 1000.0000 mg | Freq: Four times a day (QID) | ORAL | Status: DC

## 2014-03-27 MED ORDER — ROCURONIUM BROMIDE 100 MG/10ML IV SOLN
INTRAVENOUS | Status: DC | PRN
  Administered 2014-03-27: 50 mg via INTRAVENOUS

## 2014-03-27 MED ORDER — LACTATED RINGERS IV SOLN
INTRAVENOUS | Status: DC | PRN
  Administered 2014-03-27: 08:00:00 via INTRAVENOUS

## 2014-03-27 MED ORDER — MIDAZOLAM HCL 2 MG/2ML IJ SOLN
INTRAMUSCULAR | Status: DC | PRN
  Administered 2014-03-27: 2 mg via INTRAVENOUS

## 2014-03-27 MED ORDER — PHENYLEPHRINE HCL 10 MG/ML IJ SOLN
10.0000 mg | INTRAVENOUS | Status: DC | PRN
  Administered 2014-03-27: 80 ug/min via INTRAVENOUS

## 2014-03-27 MED ORDER — GLYCOPYRROLATE 0.2 MG/ML IJ SOLN
INTRAMUSCULAR | Status: DC | PRN
  Administered 2014-03-27: 0.4 mg via INTRAVENOUS

## 2014-03-27 MED ORDER — LACTATED RINGERS IV SOLN
INTRAVENOUS | Status: DC | PRN
  Administered 2014-03-27 (×2): via INTRAVENOUS

## 2014-03-27 MED ORDER — PROPOFOL 10 MG/ML IV BOLUS
INTRAVENOUS | Status: AC
  Filled 2014-03-27: qty 20

## 2014-03-27 SURGICAL SUPPLY — 44 items
BENZOIN TINCTURE PRP APPL 2/3 (GAUZE/BANDAGES/DRESSINGS) IMPLANT
CANISTER SUCTION 2500CC (MISCELLANEOUS) ×4 IMPLANT
CATH THORACIC 28FR (CATHETERS) IMPLANT
CATH THORACIC 28FR RT ANG (CATHETERS) IMPLANT
CATH THORACIC 36FR (CATHETERS) IMPLANT
CATH THORACIC 36FR RT ANG (CATHETERS) IMPLANT
CONN ST 1/4X3/8  BEN (MISCELLANEOUS) ×1
CONN ST 1/4X3/8 BEN (MISCELLANEOUS) ×3 IMPLANT
CONT SPEC 4OZ CLIKSEAL STRL BL (MISCELLANEOUS) ×8 IMPLANT
COVER SURGICAL LIGHT HANDLE (MISCELLANEOUS) ×4 IMPLANT
DERMABOND ADVANCED (GAUZE/BANDAGES/DRESSINGS) ×1
DERMABOND ADVANCED .7 DNX12 (GAUZE/BANDAGES/DRESSINGS) ×3 IMPLANT
DRAIN CHANNEL 28F RND 3/8 FF (WOUND CARE) IMPLANT
DRAPE LAPAROSCOPIC ABDOMINAL (DRAPES) ×4 IMPLANT
ELECT REM PT RETURN 9FT ADLT (ELECTROSURGICAL) ×4
ELECTRODE REM PT RTRN 9FT ADLT (ELECTROSURGICAL) ×3 IMPLANT
GAUZE SPONGE 4X4 12PLY STRL (GAUZE/BANDAGES/DRESSINGS) IMPLANT
GLOVE BIO SURGEON STRL SZ 6.5 (GLOVE) ×8 IMPLANT
HEMOSTAT POWDER SURGIFOAM 1G (HEMOSTASIS) IMPLANT
KIT BASIN OR (CUSTOM PROCEDURE TRAY) ×4 IMPLANT
KIT ROOM TURNOVER OR (KITS) ×4 IMPLANT
KIT SUCTION CATH 14FR (SUCTIONS) ×4 IMPLANT
NS IRRIG 1000ML POUR BTL (IV SOLUTION) ×8 IMPLANT
PACK CHEST (CUSTOM PROCEDURE TRAY) ×4 IMPLANT
PAD ARMBOARD 7.5X6 YLW CONV (MISCELLANEOUS) ×8 IMPLANT
PAD ELECT DEFIB RADIOL ZOLL (MISCELLANEOUS) ×4 IMPLANT
SPONGE GAUZE 4X4 12PLY STER LF (GAUZE/BANDAGES/DRESSINGS) ×4 IMPLANT
STRIP CLOSURE SKIN 1/2X4 (GAUZE/BANDAGES/DRESSINGS) IMPLANT
SUT SILK  1 MH (SUTURE) ×2
SUT SILK 1 MH (SUTURE) ×6 IMPLANT
SUT VIC AB 1 CTX 18 (SUTURE) ×4 IMPLANT
SUT VIC AB 2-0 CTX 27 (SUTURE) ×4 IMPLANT
SUT VIC AB 3-0 X1 27 (SUTURE) ×4 IMPLANT
SWAB COLLECTION DEVICE MRSA (MISCELLANEOUS) IMPLANT
SYR 50ML SLIP (SYRINGE) IMPLANT
SYRINGE 10CC LL (SYRINGE) IMPLANT
SYSTEM SAHARA CHEST DRAIN ATS (WOUND CARE) ×4 IMPLANT
TAPE CLOTH SURG 4X10 WHT LF (GAUZE/BANDAGES/DRESSINGS) ×4 IMPLANT
TOWEL OR 17X24 6PK STRL BLUE (TOWEL DISPOSABLE) ×4 IMPLANT
TOWEL OR 17X26 10 PK STRL BLUE (TOWEL DISPOSABLE) ×4 IMPLANT
TRAP SPECIMEN MUCOUS 40CC (MISCELLANEOUS) ×12 IMPLANT
TRAY FOLEY CATH 14FRSI W/METER (CATHETERS) ×4 IMPLANT
TUBE ANAEROBIC SPECIMEN COL (MISCELLANEOUS) IMPLANT
WATER STERILE IRR 1000ML POUR (IV SOLUTION) ×8 IMPLANT

## 2014-03-27 NOTE — Progress Notes (Signed)
UR Completed.  [phone]  

## 2014-03-27 NOTE — Transfer of Care (Signed)
Immediate Anesthesia Transfer of Care Note  Patient: [facility]  Procedure(s) Performed: Procedure(s) with comments: SUBXYPHOID PERICARDIAL WINDOW (N/A) CHEST TUBE INSERTION (Right) DRAINAGE OF PLEURAL EFFUSION (Right) - Drainage of right pleural effusion DRAINAGE OF PERICARDIAL FLUID (N/A)  Patient Location: PACU  Anesthesia Type:General  Level of Consciousness: awake  Airway & Oxygen Therapy: Patient Spontanous Breathing and Patient connected to nasal cannula oxygen  Post-op Assessment: Report given to RN and Post -op Vital signs reviewed and stable  Post vital signs: Reviewed and stable  Last Vitals:  Filed Vitals:   03/27/14 0700  BP: 112/59  Pulse: 95  Temp:   Resp: 23    Complications: No apparent anesthesia complications

## 2014-03-27 NOTE — Anesthesia Procedure Notes (Signed)
Procedure Name: Intubation Date/Time: 03/27/2014 8:00 AM Performed by: Marinda Elk A Pre-anesthesia Checklist: Patient identified, Timeout performed, Emergency Drugs available, Suction available and Patient being monitored Patient Re-evaluated:Patient Re-evaluated prior to inductionOxygen Delivery Method: Circle system utilized Preoxygenation: Pre-oxygenation with 100% oxygen Intubation Type: IV induction Ventilation: Mask ventilation without difficulty Laryngoscope Size: Mac and 3 Grade View: Grade II Tube type: Oral Tube size: 7.5 mm Number of attempts: 1 Airway Equipment and Method: Stylet Placement Confirmation: ETT inserted through vocal cords under direct vision,  breath sounds checked- equal and bilateral and positive ETCO2 Secured at: 21 cm Tube secured with: Tape Dental Injury: Teeth and Oropharynx as per pre-operative assessment

## 2014-03-27 NOTE — Anesthesia Postprocedure Evaluation (Signed)
  Anesthesia Post-op Note  Patient: Anna Salinas  Procedure(s) Performed: Procedure(s) with comments: SUBXYPHOID PERICARDIAL WINDOW (N/A) CHEST TUBE INSERTION (Right) DRAINAGE OF PLEURAL EFFUSION (Right) - Drainage of right pleural effusion DRAINAGE OF PERICARDIAL FLUID (N/A)  Patient Location: PACU  Anesthesia Type:General  Level of Consciousness: awake, alert , oriented and patient cooperative  Airway and Oxygen Therapy: Patient Spontanous Breathing  Post-op Pain: mild, moderate  Post-op Assessment: Post-op Vital signs reviewed, Patient's Cardiovascular Status Stable, Respiratory Function Stable, Patent Airway and No signs of Nausea or vomiting  Post-op Vital Signs: stable  Last Vitals:  Filed Vitals:   03/27/14 0928  BP: 113/68  Pulse:   Temp: 36.4 C  Resp: 20    Complications: No apparent anesthesia complications

## 2014-03-27 NOTE — Anesthesia Preprocedure Evaluation (Addendum)
Anesthesia Evaluation  Patient identified by MRN, date of birth, ID band Patient awake    Reviewed: Allergy & Precautions, NPO status , Patient's Chart, lab work & pertinent test results, reviewed documented beta blocker date and time   Airway Mallampati: I  TM Distance: >3 FB Neck ROM: Full    Dental  (+) Teeth Intact, Dental Advisory Given   Pulmonary shortness of breath and at rest,          Cardiovascular hypertension, Pt. on home beta blockers and Pt. on medications + Valvular Problems/Murmurs AS     Neuro/Psych    GI/Hepatic   Endo/Other  diabetes, Type 2, Oral Hypoglycemic Agents  Renal/GU      Musculoskeletal   Abdominal   Peds  Hematology   Anesthesia Other Findings Ao valve replacement Pericardial effusion  Reproductive/Obstetrics                            Anesthesia Physical Anesthesia Plan  ASA: III  Anesthesia Plan: General   Post-op Pain Management:    Induction: Intravenous  Airway Management Planned: Oral ETT  Additional Equipment: Arterial line and CVP  Intra-op Plan:   Post-operative Plan: Extubation in OR and Possible Post-op intubation/ventilation  Informed Consent: I have reviewed the patients History and Physical, chart, labs and discussed the procedure including the risks, benefits and alternatives for the proposed anesthesia with the patient or authorized representative who has indicated his/her understanding and acceptance.     Plan Discussed with: CRNA, Anesthesiologist and Surgeon  Anesthesia Plan Comments:         Anesthesia Quick Evaluation

## 2014-03-27 NOTE — Brief Op Note (Signed)
      [address]       [place] [address]             [phone]       03/27/2014  9:12 AM  PATIENT:  Anna Salinas  44 y.o. female  PRE-OPERATIVE DIAGNOSIS:  PERICARDIAL effusion and right Pleural effusion  POST-OPERATIVE DIAGNOSIS: same  PROCEDURE:  Procedure(s) with comments: SUBXYPHOID PERICARDIAL WINDOW (N/A) CHEST TUBE INSERTION (Right) DRAINAGE OF PLEURAL EFFUSION (Right) - Drainage of right pleural effusion DRAINAGE OF PERICARDIAL FLUID (N/A)  SURGEON:  Surgeon(s) and Role:    * Grace Isaac, MD - Primary    ANESTHESIA:   general  EBL:  Total I/O In: 250 [IV Piggyback:250] Out: 65 [Urine:30; Blood:25]  BLOOD ADMINISTERED:none  DRAINS: (28) Blake drain(s) in the pericardium 28 chest tube in right chest, foley in bladder  LOCAL MEDICATIONS USED:  NONE  SPECIMEN:  Source of Specimen:  pericardial fluid  DISPOSITION OF SPECIMEN:  lab  COUNTS:  YES   DICTATION: .Dragon Dictation  PLAN OF CARE: patient already admitted  PATIENT DISPOSITION:  PACU - hemodynamically stable.   Delay start of Pharmacological VTE agent (>24hrs) due to surgical blood loss or risk of bleeding: yes  Findings: 862ml of straw colored fluid from pericardium additional 1000 ml from right chest Tee done, valve functioning well, effusion of pericardium well drained

## 2014-03-27 NOTE — Progress Notes (Signed)
Patient ID: Anna Salinas, female   DOB: 02/15/1970, 44 y.o.   MRN: [id number] EVENING ROUNDS NOTE :     [address]       [place] [address]             [phone]                 Day of Surgery Procedure(s) (LRB): SUBXYPHOID PERICARDIAL WINDOW (N/A) CHEST TUBE INSERTION (Right) DRAINAGE OF PLEURAL EFFUSION (Right) DRAINAGE OF PERICARDIAL FLUID (N/A)  Total Length of Stay:  LOS: 1 day  BP 76/56 mmHg  Pulse 88  Temp(Src) 97.9 F (36.6 C) (Oral)  Resp 20  Ht 5\' 7"  (1.702 m)  Wt 238 lb 5.1 oz (108.1 kg)  BMI 37.32 kg/m2  SpO2 97%  LMP 03/11/2014  .Intake/Output      03/13 0701 - 03/14 0700   P.O. 360   I.V. (mL/kg) 1912.5 (17.7)   Other 10   IV Piggyback 250   Total Intake(mL/kg) 2532.5 (23.4)   Urine (mL/kg/hr) 270 (0.2)   Blood 25 (0)   Chest Tube 1790 (1.3)   Total Output 2085   Net +447.5         . dextrose 5 % and 0.45 % NaCl with KCl 20 mEq/L 15 mL/hr (03/27/14 1925)     Lab Results  Component Value Date   WBC 5.8 03/27/2014   HGB 10.1* 03/27/2014   HCT 33.3* 03/27/2014   PLT 188 03/27/2014   GLUCOSE 173* 03/27/2014   ALT 27 03/26/2014   AST 41* 03/26/2014   NA 137 03/27/2014   K 3.1* 03/27/2014   CL 102 03/27/2014   CREATININE 0.70 03/27/2014   BUN <5* 03/27/2014   CO2 28 03/27/2014   INR 1.97* 03/27/2014   HGBA1C 10.3* 02/10/2014   Stable post op Will need to adjust coumadin dose in am depending on inr Feels better  Grace Isaac MD  Beeper [phone] Office [phone] 03/27/2014 7:32 PM

## 2014-03-27 NOTE — Progress Notes (Signed)
Primary cardiologist: Dr. Peter Martinique  Seen for followup: Pericardial effusion with tamponade  Subjective:    Just back from PACU status post pericardial window and drainage of pleural effusion. Awake and alert.  Objective:   Temp:  [97.3 F (36.3 C)-98.4 F (36.9 C)] 97.3 F (36.3 C) (03/13 1030) Pulse Rate:  [80-114] 90 (03/13 1045) Resp:  [11-35] 16 (03/13 1045) BP: (100-128)/(42-81) 101/66 mmHg (03/13 1045) SpO2:  [95 %-100 %] 98 % (03/13 1045) Arterial Line BP: (128)/(70-73) 128/70 mmHg (03/13 1030) Weight:  [238 lb 5.1 oz (108.1 kg)-241 lb 8 oz (109.544 kg)] 238 lb 5.1 oz (108.1 kg) (03/13 0600) Last BM Date: 03/26/14  Filed Weights   03/26/14 1109 03/26/14 1805 03/27/14 0600  Weight: 241 lb 8 oz (109.544 kg) 241 lb (109.317 kg) 238 lb 5.1 oz (108.1 kg)    Intake/Output Summary (Last 24 hours) at 03/27/14 1104 Last data filed at 03/27/14 1030  Gross per 24 hour  Intake   2533 ml  Output   1955 ml  Net    578 ml    Telemetry: Sinus rhythm.  Exam:  General: No distress.  Lungs: Clear anteriorly. Has right sided chest tube in place.  Cardiac: RRR without rub, dressed pericardial drain.  Abdomen: NABS.  Extremities: 1+ edema.   Lab Results:  Basic Metabolic Panel:  Recent Labs Lab 03/26/14 1050 03/27/14 0336  NA 138 137  K 2.9* 3.1*  CL 99 102  CO2 29 28  GLUCOSE 178* 173*  BUN <5* <5*  CREATININE 0.72 0.70  CALCIUM 8.8 8.4    Liver Function Tests:  Recent Labs Lab 03/26/14 1050  AST 41*  ALT 27  ALKPHOS 65  BILITOT 0.8  PROT 6.7  ALBUMIN 3.5    CBC:  Recent Labs Lab 03/26/14 1050 03/27/14 0336  WBC 5.9 5.8  HGB 11.3* 10.1*  HCT 36.5 33.3*  MCV 84.1 84.3  PLT 229 188    Coagulation:  Recent Labs Lab 03/23/14 0747 03/26/14 1301 03/27/14 0336  INR 2.7 2.22* 1.97*     Medications:   Scheduled Medications: . cefUROXime (ZINACEF)  IV  750 mg Intravenous To OR  . dexmedetomidine  0.1-0.7 mcg/kg/hr  Intravenous To OR  . DOPamine  0-10 mcg/kg/min Intravenous To OR  . epinephrine  0-10 mcg/min Intravenous To OR  . heparin-papaverine-plasmalyte irrigation   Irrigation To OR  . HYDROmorphone      . insulin aspart  0-15 Units Subcutaneous TID WC  . insulin aspart  0-5 Units Subcutaneous QHS  . insulin (NOVOLIN-R) infusion   Intravenous To OR  . magnesium sulfate  40 mEq Other To OR  . metoprolol tartrate  12.5 mg Oral BID  . nitroGLYCERIN  2-200 mcg/min Intravenous To OR  . phenylephrine (NEO-SYNEPHRINE) Adult infusion  30-200 mcg/min Intravenous To OR  . potassium chloride  80 mEq Other To OR  . scopolamine      . sodium chloride  3 mL Intravenous Q12H  . vancomycin  1,500 mg Intravenous To OR      PRN Medications:  acetaminophen, HYDROmorphone (DILAUDID) injection, ondansetron (ZOFRAN) IV, sodium chloride   Assessment:   1. Large pericardial effusion with tamponade now status post pericardial window with drainage in place. 800 cc of straw-colored fluid removed initially.  2. Moderately large right pleural effusion status post drainage, chest tube in place, 1000 cc removed initially.  3. Status post [employer] mechanical AVR in February of this year. Coumadin being resumed.  4. Essential hypertension, blood pressure stable.  5. Type 2 diabetes mellitus. Currently on sliding scale. Reportedly was given Januvia as an outpatient, although did not get it filled due to cost.   Plan/Discussion:    Continue Lopressor and Coumadin. Management of surgical drains per TCTS. Follow glucose and transition back to appropriate oral agent.   Satira Sark, M.D., F.A.C.C.

## 2014-03-27 NOTE — Op Note (Signed)
[name], Anna Salinas NO.:  [id number]  MEDICAL RECORD NO.:  [id number]  LOCATION:  2S10C                        FACILITY:  [facility]  PHYSICIAN:  Lanelle Bal, MD    DATE OF BIRTH:  [birth year]  DATE OF PROCEDURE:  03/27/2014 DATE OF DISCHARGE:                              OPERATIVE REPORT   PREOPERATIVE DIAGNOSES:  Large pericardial effusion status post recent aortic valve replacement and right pleural effusion.  POSTOPERATIVE DIAGNOSES:  Large pericardial effusion status post recent aortic valve replacement and right pleural effusion.  PROCEDURE:  Subxiphoid pericardial window with drainage of pericardial effusion and placement of right chest tube with drainage of right pleural effusion.  SURGEON:  Lanelle Bal, MD  BRIEF HISTORY:  The patient is a 44 year old female who has been on Coumadin for replacement of aortic valve on February 14, 2014. Initially, she had been doing well until the last 7 to 10 days, when she developed increasing pedal edema and some shortness of breath.  She was started on diuretic therapy by the Cardiology Service.  She then presented to the emergency room.  Echocardiogram was performed that demonstrated a large pericardial effusion and we made arrangements to proceed with drainage of her effusion with subxiphoid window.  Risks and options of surgery were discussed with the patient in detail.  She agreed and signed informed consent.  DESCRIPTION OF PROCEDURE:  The patient was brought to the operating room in fasting state, hemodynamically stable and appropriate.  She underwent general endotracheal anesthesia and a TEE probe was placed by Dr. Tamala Julian. Skin of the chest, abdomen, and pelvis was prepped with Betadine and draped in sterile manner.  Appropriate time-out was performed.  We then proceeded with opening the lower sternal incision down to the xiphoid, elevating the xiphoid and dissecting to the pericardial space which  was opened and approximately 1000 mL of straw-colored fluid was evacuated. Portion of this fluid was sent for cell count and chemistry and culture. A small incision was made over the right chest and the right chest tube was placed without difficulty.  An additional 900 mL of straw-colored fluid was returned.  The chest tubes were secured in place through a separate site.  A Blake mediastinal drain was left inferiorly in the pericardial space and secured.  The fascia was then closed with interrupted 0 Vicryl, running 2-0 Vicryl in subcutaneous tissue, and a 3- 0 subcuticular stitch.  Dry dressings were applied.  TEE showed good functioning of the myocardium with resolution of the effusion.  The previously placed prosthetic valve was well seated without any evidence of perivalvular leak and both leaflets were moving normally. The sponge and needle count was reported as correct at the completion of the procedure.  The patient tolerated the procedure without obvious complication and was transferred to the Surgical Intensive Care Unit for further postoperative care, having tolerated the procedure without obvious complication.     Lanelle Bal, MD     EG/MEDQ  D:  03/27/2014  T:  03/27/2014  Job:  [id number]

## 2014-03-28 ENCOUNTER — Encounter (HOSPITAL_COMMUNITY): Admission: RE | Admit: 2014-03-28 | Payer: BLUE CROSS/BLUE SHIELD | Source: Ambulatory Visit

## 2014-03-28 ENCOUNTER — Inpatient Hospital Stay (HOSPITAL_COMMUNITY): Payer: BLUE CROSS/BLUE SHIELD

## 2014-03-28 DIAGNOSIS — Z7901 Long term (current) use of anticoagulants: Secondary | ICD-10-CM

## 2014-03-28 LAB — GLUCOSE, CAPILLARY
GLUCOSE-CAPILLARY: 144 mg/dL — AB (ref 70–99)
GLUCOSE-CAPILLARY: 113 mg/dL — AB (ref 70–99)

## 2014-03-28 LAB — PROTIME-INR
INR: 2.36 — AB (ref 0.00–1.49)
Prothrombin Time: 26 seconds — ABNORMAL HIGH (ref 11.6–15.2)

## 2014-03-28 LAB — POCT I-STAT 3, ART BLOOD GAS (G3+)
Acid-Base Excess: 2 mmol/L (ref 0.0–2.0)
BICARBONATE: 26 meq/L — AB (ref 20.0–24.0)
O2 Saturation: 96 %
TCO2: 27 mmol/L (ref 0–100)
pCO2 arterial: 39.2 mmHg (ref 35.0–45.0)
pH, Arterial: 7.429 (ref 7.350–7.450)
pO2, Arterial: 82 mmHg (ref 80.0–100.0)

## 2014-03-28 LAB — BASIC METABOLIC PANEL
Anion gap: 8 (ref 5–15)
BUN: 6 mg/dL (ref 6–23)
CO2: 26 mmol/L (ref 19–32)
Calcium: 8.1 mg/dL — ABNORMAL LOW (ref 8.4–10.5)
Chloride: 101 mmol/L (ref 96–112)
Creatinine, Ser: 0.64 mg/dL (ref 0.50–1.10)
GFR calc Af Amer: >90 mL/min (ref >90)
GFR calc non Af Amer: >90 mL/min (ref >90)
Glucose, Bld: 137 mg/dL — ABNORMAL HIGH (ref 70–99)
Potassium: 3.4 mmol/L — ABNORMAL LOW (ref 3.5–5.1)
Sodium: 135 mmol/L (ref 135–145)

## 2014-03-28 LAB — CBC
HCT: 33.7 % — ABNORMAL LOW (ref 36.0–46.0)
Hemoglobin: 10.5 g/dL — ABNORMAL LOW (ref 12.0–15.0)
MCH: 26.4 pg (ref 26.0–34.0)
MCHC: 31.2 g/dL (ref 30.0–36.0)
MCV: 84.9 fL (ref 78.0–100.0)
Platelets: 178 10*3/uL (ref 150–400)
RBC: 3.97 MIL/uL (ref 3.87–5.11)
RDW: 15.1 % (ref 11.5–15.5)
WBC: 8.5 10*3/uL (ref 4.0–10.5)

## 2014-03-28 LAB — PATHOLOGIST SMEAR REVIEW

## 2014-03-28 LAB — HEMOGLOBIN A1C
Hgb A1c MFr Bld: 8.6 % — ABNORMAL HIGH (ref 4.8–5.6)
Mean Plasma Glucose: 200 mg/dL

## 2014-03-28 MED ORDER — POTASSIUM CHLORIDE 10 MEQ/50ML IV SOLN
10.0000 meq | INTRAVENOUS | Status: AC
  Administered 2014-03-28 (×2): 10 meq via INTRAVENOUS

## 2014-03-28 NOTE — Progress Notes (Signed)
Report given to night shift.

## 2014-03-28 NOTE — Plan of Care (Signed)
Problem: Phase I Progression Outcomes Goal: Tubes/drains patent Outcome: Completed/Met Date Met:  03/28/14 1 PCT and 1 MT draining to -20 cm sx serosang fluid without evidence of air leak.

## 2014-03-28 NOTE — Progress Notes (Signed)
TCTS BRIEF SICU PROGRESS NOTE  1 Day Post-Op  S/P Procedure(s) (LRB): SUBXYPHOID PERICARDIAL WINDOW (N/A) CHEST TUBE INSERTION (Right) DRAINAGE OF PLEURAL EFFUSION (Right) DRAINAGE OF PERICARDIAL FLUID (N/A)   No complaints Minimal pain and breathing comfortably NSR w/ stable BP  Plan: Continue current plan  Rexene Alberts 03/28/2014 9:03 PM

## 2014-03-28 NOTE — Progress Notes (Signed)
1 Day Post-Op Procedure(s) (LRB): SUBXYPHOID PERICARDIAL WINDOW (N/A) CHEST TUBE INSERTION (Right) DRAINAGE OF PLEURAL EFFUSION (Right) DRAINAGE OF PERICARDIAL FLUID (N/A) Subjective: No complaints  Objective: Vital signs in last 24 hours: Temp:  [97.3 F (36.3 C)-98.7 F (37.1 C)] 98.7 F (37.1 C) (03/14 0400) Pulse Rate:  [80-93] 85 (03/14 0500) Cardiac Rhythm:  [-] Normal sinus rhythm (03/13 2000) Resp:  [11-33] 25 (03/14 0500) BP: (75-128)/(37-81) 106/62 mmHg (03/13 2135) SpO2:  [95 %-100 %] 96 % (03/14 0500) Arterial Line BP: (93-130)/(55-74) 93/57 mmHg (03/14 0500)  Hemodynamic parameters for last 24 hours:    Intake/Output from previous day: 03/13 0701 - 03/14 0700 In: 3887.5 [P.O.:1440; I.V.:2087.5; IV Piggyback:350] Out: 3570 [Urine:1105; Blood:25; Chest Tube:2240] Intake/Output this shift:    General appearance: alert and cooperative Heart: regular rate and rhythm, S1, S2 normal, no murmur, click, rub or gallop Lungs: diminished breath sounds bibasilar Wound: dressing dry CT output serous  Lab Results:  Recent Labs  03/27/14 0336 03/28/14 0347  WBC 5.8 8.5  HGB 10.1* 10.5*  HCT 33.3* 33.7*  PLT 188 178   BMET:  Recent Labs  03/27/14 0336 03/28/14 0347  NA 137 135  K 3.1* 3.4*  CL 102 101  CO2 28 26  GLUCOSE 173* 137*  BUN <5* 6  CREATININE 0.70 0.64  CALCIUM 8.4 8.1*    PT/INR:  Recent Labs  03/28/14 0347  LABPROT 26.0*  INR 2.36*   ABG    Component Value Date/Time   PHART 7.350 02/14/2014 1802   HCO3 22.9 02/14/2014 1802   TCO2 21 02/15/2014 1715   ACIDBASEDEF 3.0* 02/14/2014 1802   O2SAT 100.0 02/14/2014 1802   CBG (last 3)   Recent Labs  03/27/14 1939 03/27/14 2336 03/28/14 0445  GLUCAP 176* 127* 113*    Assessment/Plan: S/P Procedure(s) (LRB): SUBXYPHOID PERICARDIAL WINDOW (N/A) CHEST TUBE INSERTION (Right) DRAINAGE OF PLEURAL EFFUSION (Right) DRAINAGE OF PERICARDIAL FLUID (N/A)  She is doing well. DC  arterial line and foley Mobilize IS Coumadin for mechanical AVR Chest tubes to water seal   LOS: 2 days    Gaye Pollack 03/28/2014

## 2014-03-28 NOTE — Progress Notes (Signed)
       Patient Name: Anna Salinas Date of Encounter: 03/28/2014    SUBJECTIVE: Feels much better. Able to lie flat. No significant chest pain.  TELEMETRY:  Normal sinus rhythm. Filed Vitals:   03/28/14 0300 03/28/14 0400 03/28/14 0500 03/28/14 0700  BP:      Pulse: 87 85 85 85  Temp:  98.7 F (37.1 C)  99.5 F (37.5 C)  TempSrc:  Oral  Oral  Resp: 25 20 25 24   Height:      Weight:      SpO2: 96% 97% 96% 96%    Intake/Output Summary (Last 24 hours) at 03/28/14 0830 Last data filed at 03/28/14 0600  Gross per 24 hour  Intake 3887.5 ml  Output   3370 ml  Net  517.5 ml   LABS: Basic Metabolic Panel:  Recent Labs  03/27/14 0336 03/28/14 0347  NA 137 135  K 3.1* 3.4*  CL 102 101  CO2 28 26  GLUCOSE 173* 137*  BUN <5* 6  CREATININE 0.70 0.64  CALCIUM 8.4 8.1*   CBC:  Recent Labs  03/26/14 1050 03/27/14 0336 03/28/14 0347  WBC 5.9 5.8 8.5  NEUTROABS 3.7  --   --   HGB 11.3* 10.1* 10.5*  HCT 36.5 33.3* 33.7*  MCV 84.1 84.3 84.9  PLT 229 188 178     Radiology/Studies:   No new data  Physical Exam: Blood pressure 106/62, pulse 85, temperature 99.5 F (37.5 C), temperature source Oral, resp. rate 24, height 5\' 7"  (1.702 m), weight 238 lb 5.1 oz (108.1 kg), last menstrual period 03/11/2014, SpO2 96 %. Weight change:   Wt Readings from Last 3 Encounters:  03/27/14 238 lb 5.1 oz (108.1 kg)  03/09/14 227 lb (102.967 kg)  03/03/14 222 lb (100.699 kg)   Chest is clear anteriorly Cardiac exam reveals no pericardial rub. Crisp aortic valve closure sounds   Extremities reveal no edema  ASSESSMENT:  1. Post cardiotomy syndrome with pleural and pericardial effusion both mechanically drained with the patient markedly improved area 2.  Mechanical aortic valve 3. Normal left ventricular systolic function.  Plan:  Mechanical drainage of both pericardial and pleural effusions. Consider anti-inflammatory therapy.  Demetrios Isaacs 03/28/2014, 8:30 AM

## 2014-03-28 NOTE — Progress Notes (Signed)
K+= 3.4 and creat= 0.6 w/ urine o/p > 30cc/hr; TCTS KCL protocol initiated with 10 mEq KCL in 50cc IV x 3, each over one hour.

## 2014-03-29 ENCOUNTER — Telehealth: Payer: Self-pay | Admitting: Nurse Practitioner

## 2014-03-29 ENCOUNTER — Ambulatory Visit: Payer: BLUE CROSS/BLUE SHIELD | Admitting: Physician Assistant

## 2014-03-29 ENCOUNTER — Other Ambulatory Visit: Payer: BLUE CROSS/BLUE SHIELD

## 2014-03-29 ENCOUNTER — Inpatient Hospital Stay (HOSPITAL_COMMUNITY): Payer: BLUE CROSS/BLUE SHIELD

## 2014-03-29 ENCOUNTER — Encounter (HOSPITAL_COMMUNITY): Payer: Self-pay | Admitting: Cardiothoracic Surgery

## 2014-03-29 DIAGNOSIS — E669 Obesity, unspecified: Secondary | ICD-10-CM

## 2014-03-29 LAB — PROTIME-INR
INR: 2.6 — ABNORMAL HIGH (ref 0.00–1.49)
Prothrombin Time: 28.1 seconds — ABNORMAL HIGH (ref 11.6–15.2)

## 2014-03-29 NOTE — Progress Notes (Signed)
2 Days Post-Op Procedure(s) (LRB): SUBXYPHOID PERICARDIAL WINDOW (N/A) CHEST TUBE INSERTION (Right) DRAINAGE OF PLEURAL EFFUSION (Right) DRAINAGE OF PERICARDIAL FLUID (N/A) Subjective:  No complaints  Objective: Vital signs in last 24 hours: Temp:  [98.9 F (37.2 C)-100.4 F (38 C)] 98.9 F (37.2 C) (03/15 0400) Pulse Rate:  [90-108] 90 (03/15 0700) Cardiac Rhythm:  [-] Normal sinus rhythm (03/15 0600) Resp:  [14-42] 18 (03/15 0700) BP: (91-113)/(30-61) 107/54 mmHg (03/15 0700) SpO2:  [90 %-99 %] 96 % (03/15 0700) Weight:  [108.364 kg (238 lb 14.4 oz)] 108.364 kg (238 lb 14.4 oz) (03/15 0400)  Hemodynamic parameters for last 24 hours:    Intake/Output from previous day: 03/14 0701 - 03/15 0700 In: 685 [P.O.:240; I.V.:445] Out: 1480 [Urine:1050; Chest Tube:430] Intake/Output this shift:    General appearance: alert and cooperative Heart: regular rate and rhythm, S1, S2 normal, no murmur, click, rub or gallop Lungs: diminished breath sounds bibasilar Wound: incision ok CT output decreasing, serous.  Lab Results:  Recent Labs  03/27/14 0336 03/28/14 0347  WBC 5.8 8.5  HGB 10.1* 10.5*  HCT 33.3* 33.7*  PLT 188 178   BMET:  Recent Labs  03/27/14 0336 03/28/14 0347  NA 137 135  K 3.1* 3.4*  CL 102 101  CO2 28 26  GLUCOSE 173* 137*  BUN <5* 6  CREATININE 0.70 0.64  CALCIUM 8.4 8.1*    PT/INR:  Recent Labs  03/29/14 0430  LABPROT 28.1*  INR 2.60*   ABG    Component Value Date/Time   PHART 7.429 03/28/2014 0401   HCO3 26.0* 03/28/2014 0401   TCO2 27 03/28/2014 0401   ACIDBASEDEF 3.0* 02/14/2014 1802   O2SAT 96.0 03/28/2014 0401   CBG (last 3)   Recent Labs  03/27/14 1939 03/27/14 2336 03/28/14 0445  GLUCAP 176* 127* 113*    Assessment/Plan: S/P Procedure(s) (LRB): SUBXYPHOID PERICARDIAL WINDOW (N/A) CHEST TUBE INSERTION (Right) DRAINAGE OF PLEURAL EFFUSION (Right) DRAINAGE OF PERICARDIAL FLUID (N/A)  She is doing well overall.  Chest tube output is decreasing but I think they should stay in for another day. Continue mobilization, IS. Coumadin for mechanical aortic valve.   LOS: 3 days    Gaye Pollack 03/29/2014

## 2014-03-29 NOTE — Plan of Care (Signed)
Problem: Phase I Progression Outcomes Goal: OOB as tolerated unless otherwise ordered Outcome: Completed/Met Date Met:  03/29/14 OOB for 8 hours and ambulated a lap and half with little discomfort.  Goal: Voiding-avoid urinary catheter unless indicated Outcome: Completed/Met Date Met:  03/29/14 voiding  Problem: Phase II Progression Outcomes Goal: Progressing with IS, TCDB Outcome: Progressing 750 Goal: Dressings dry/intact Outcome: Progressing Dressing C/D/I, changed Goal: Return of bowel function (flatus, BM) IF ABDOMINAL SURGERY:  Outcome: Progressing Passing flatus Goal: Foley discontinued Outcome: Completed/Met Date Met:  03/29/14 Foley discontinued, voiding on her own

## 2014-03-29 NOTE — Progress Notes (Signed)
       Patient Name: Anna Salinas Date of Encounter: 03/29/2014    SUBJECTIVE:Feels better. Lying flat  TELEMETRY:  NSR, without AF. Filed Vitals:   03/29/14 0400 03/29/14 0500 03/29/14 0600 03/29/14 0700  BP: 113/51 110/58 98/51 107/54  Pulse: 96 97 96 90  Temp: 98.9 F (37.2 C)     TempSrc: Oral     Resp: 29 28 20 18   Height:      Weight: 238 lb 14.4 oz (108.364 kg)     SpO2: 95% 94% 94% 96%    Intake/Output Summary (Last 24 hours) at 03/29/14 0804 Last data filed at 03/29/14 0600  Gross per 24 hour  Intake    670 ml  Output   1455 ml  Net   -785 ml   LABS: Basic Metabolic Panel:  Recent Labs  03/27/14 0336 03/28/14 0347  NA 137 135  K 3.1* 3.4*  CL 102 101  CO2 28 26  GLUCOSE 173* 137*  BUN <5* 6  CREATININE 0.70 0.64  CALCIUM 8.4 8.1*   CBC:  Recent Labs  03/26/14 1050 03/27/14 0336 03/28/14 0347  WBC 5.9 5.8 8.5  NEUTROABS 3.7  --   --   HGB 11.3* 10.1* 10.5*  HCT 36.5 33.3* 33.7*  MCV 84.1 84.3 84.9  PLT 229 188 178  Hemoglobin A1C:  Recent Labs  03/26/14 1840  HGBA1C 8.6*   Fasting Lipid Panel: No results for input(s): CHOL, HDL, LDLCALC, TRIG, CHOLHDL, LDLDIRECT in the last 72 hours.   INR 2.6  Radiology/Studies:  Cardiomegaly with left lung opacification > right and worse than yesterday. Could be technique  Physical Exam: Blood pressure 107/54, pulse 90, temperature 98.9 F (37.2 C), temperature source Oral, resp. rate 18, height 5\' 7"  (1.702 m), weight 238 lb 14.4 oz (108.364 kg), last menstrual period 03/11/2014, SpO2 96 %. Weight change:   Wt Readings from Last 3 Encounters:  03/29/14 238 lb 14.4 oz (108.364 kg)  03/09/14 227 lb (102.967 kg)  03/03/14 222 lb (100.699 kg)    Clear lungs anteriorly Cardiac without rub  ASSESSMENT:  1. Post Cardiotomy syndrome 2. Pleural and pericardial effusions mechanically drained 3. S/P AVR with mechanical prosthesis  Plan:  1. Per TCTS 2. Follow INR to prevent super  therapeutic levels. Pharmacy on case. 3. We will follow. Demetrios Isaacs 03/29/2014, 8:04 AM

## 2014-03-29 NOTE — Progress Notes (Signed)
Patient ID: Anna Salinas, female   DOB: 06-07-1970, 44 y.o.   MRN: [id number]  SICU Evening Rounds:  Hemodynamically stable.  Chest tubes put out 250 cc so far today.  Ambulated well. Will decide about tube removal in the am.

## 2014-03-30 ENCOUNTER — Encounter (HOSPITAL_COMMUNITY): Payer: BLUE CROSS/BLUE SHIELD

## 2014-03-30 ENCOUNTER — Ambulatory Visit: Payer: Self-pay | Admitting: Surgery

## 2014-03-30 ENCOUNTER — Inpatient Hospital Stay (HOSPITAL_COMMUNITY): Payer: BLUE CROSS/BLUE SHIELD

## 2014-03-30 ENCOUNTER — Ambulatory Visit: Payer: BLUE CROSS/BLUE SHIELD | Admitting: Pharmacist Clinician (PhC)/ Clinical Pharmacy Specialist

## 2014-03-30 LAB — PROTIME-INR
INR: 3.1 — ABNORMAL HIGH (ref 0.00–1.49)
Prothrombin Time: 32.2 seconds — ABNORMAL HIGH (ref 11.6–15.2)

## 2014-03-30 MED ORDER — FUROSEMIDE 20 MG PO TABS
20.0000 mg | ORAL_TABLET | Freq: Every day | ORAL | Status: DC
  Administered 2014-03-30 – 2014-04-01 (×3): 20 mg via ORAL
  Filled 2014-03-30 (×3): qty 1

## 2014-03-30 MED ORDER — WARFARIN SODIUM 2.5 MG PO TABS
2.5000 mg | ORAL_TABLET | Freq: Every day | ORAL | Status: DC
  Administered 2014-03-30 – 2014-03-31 (×2): 2.5 mg via ORAL
  Filled 2014-03-30 (×3): qty 1

## 2014-03-30 MED ORDER — POTASSIUM CHLORIDE CRYS ER 20 MEQ PO TBCR
20.0000 meq | EXTENDED_RELEASE_TABLET | Freq: Two times a day (BID) | ORAL | Status: DC
  Administered 2014-03-30 – 2014-04-01 (×5): 20 meq via ORAL
  Filled 2014-03-30 (×6): qty 1

## 2014-03-30 NOTE — Progress Notes (Addendum)
3 Days Post-Op Procedure(s) (LRB): SUBXYPHOID PERICARDIAL WINDOW (N/A) CHEST TUBE INSERTION (Right) DRAINAGE OF PLEURAL EFFUSION (Right) DRAINAGE OF PERICARDIAL FLUID (N/A) Subjective:  No pain. Has noted some swelling in ankles.  Objective: Vital signs in last 24 hours: Temp:  [97.6 F (36.4 C)-98.8 F (37.1 C)] 98 F (36.7 C) (03/16 0744) Pulse Rate:  [79-99] 85 (03/16 0800) Cardiac Rhythm:  [-] Normal sinus rhythm (03/16 0800) Resp:  [17-38] 25 (03/16 0800) BP: (92-115)/(42-63) 103/57 mmHg (03/16 0800) SpO2:  [92 %-99 %] 92 % (03/16 0800)  Hemodynamic parameters for last 24 hours:    Intake/Output from previous day: 03/15 0701 - 03/16 0700 In: 195 [P.O.:180; I.V.:15] Out: 3440 [Urine:3100; Chest Tube:340] Intake/Output this shift: Total I/O In: -  Out: 430 [Urine:400; Chest Tube:30]  General appearance: alert and cooperative Neurologic: intact Heart: regular rate and rhythm, S1, S2 normal, no murmur, click, rub or gallop Lungs: diminished breath sounds bibasilar Abdomen: soft, non-tender; bowel sounds normal; no masses,  no organomegaly Extremities: edema mild ankle edema bilat R>L Wound: incision ok  Lab Results:  Recent Labs  03/28/14 0347  WBC 8.5  HGB 10.5*  HCT 33.7*  PLT 178   BMET:  Recent Labs  03/28/14 0347  NA 135  K 3.4*  CL 101  CO2 26  GLUCOSE 137*  BUN 6  CREATININE 0.64  CALCIUM 8.1*    PT/INR:  Recent Labs  03/30/14 0330  LABPROT 32.2*  INR 3.10*   ABG    Component Value Date/Time   PHART 7.429 03/28/2014 0401   HCO3 26.0* 03/28/2014 0401   TCO2 27 03/28/2014 0401   ACIDBASEDEF 3.0* 02/14/2014 1802   O2SAT 96.0 03/28/2014 0401   CBG (last 3)   Recent Labs  03/27/14 1939 03/27/14 2336 03/28/14 0445  GLUCAP 176* 127* 113*    Assessment/Plan: S/P Procedure(s) (LRB): SUBXYPHOID PERICARDIAL WINDOW (N/A) CHEST TUBE INSERTION (Right) DRAINAGE OF PLEURAL EFFUSION (Right) DRAINAGE OF PERICARDIAL FLUID (N/A) She  is hemodynamically stable with low normal BP. Will stop the lopressor since it was making her dizzy and nauseous at home. I suspect her BP was dropping too low.  Chest tube output low so will remove them.  Start low dose diuretic for edema. BP is normal so will have to keep on low dose. INR therapeutic. She was on 2.5 mg daily except 5 mg Monday prior to admission and INR was 2.22 on admission. Will continue this dose.  Transfer to 2W and continue mobilization.   LOS: 4 days    Anna Salinas 03/30/2014

## 2014-03-30 NOTE — Progress Notes (Signed)
Patient ID: ELAN MCELVAIN, female   DOB: November 23, 1970, 44 y.o.   MRN: [id number] EVENING ROUNDS NOTE :     [address]       [place] [address]             [phone]                 3 Days Post-Op Procedure(s) (LRB): SUBXYPHOID PERICARDIAL WINDOW (N/A) CHEST TUBE INSERTION (Right) DRAINAGE OF PLEURAL EFFUSION (Right) DRAINAGE OF PERICARDIAL FLUID (N/A)  Total Length of Stay:  LOS: 4 days  BP 103/51 mmHg  Pulse 90  Temp(Src) 98.1 F (36.7 C) (Oral)  Resp 21  Ht 5\' 7"  (1.702 m)  Wt 238 lb 14.4 oz (108.364 kg)  BMI 37.41 kg/m2  SpO2 100%  LMP 03/11/2014  .Intake/Output      03/15 0701 - 03/16 0700 03/16 0701 - 03/17 0700   P.O. 180 540   I.V. (mL/kg) 15 (0.1)    Total Intake(mL/kg) 195 (1.8) 540 (5)   Urine (mL/kg/hr) 3100 (1.2) 2600 (2.3)   Stool 0 (0)    Chest Tube 340 (0.1) 50 (0)   Total Output 3440 2650   Net -3245 -2110        Stool Occurrence 1 x          Lab Results  Component Value Date   WBC 8.5 03/28/2014   HGB 10.5* 03/28/2014   HCT 33.7* 03/28/2014   PLT 178 03/28/2014   GLUCOSE 137* 03/28/2014   ALT 27 03/26/2014   AST 41* 03/26/2014   NA 135 03/28/2014   K 3.4* 03/28/2014   CL 101 03/28/2014   CREATININE 0.64 03/28/2014   BUN 6 03/28/2014   CO2 26 03/28/2014   INR 3.10* 03/30/2014   HGBA1C 8.6* 03/26/2014   Feels better , inr now 3.10 on 5 mg per day, preop was on 2.5 mg x6 days then 5mg  one day will decrease dose to 2.5 mg day Waiting for transfer  Grace Isaac MD  [facility] [phone] Office [phone] 03/30/2014 5:19 PM

## 2014-03-31 ENCOUNTER — Inpatient Hospital Stay (HOSPITAL_COMMUNITY): Payer: BLUE CROSS/BLUE SHIELD

## 2014-03-31 ENCOUNTER — Ambulatory Visit: Payer: Self-pay | Admitting: Nurse Practitioner

## 2014-03-31 DIAGNOSIS — I970 Postcardiotomy syndrome: Secondary | ICD-10-CM | POA: Insufficient documentation

## 2014-03-31 DIAGNOSIS — J90 Pleural effusion, not elsewhere classified: Secondary | ICD-10-CM | POA: Insufficient documentation

## 2014-03-31 LAB — BODY FLUID CULTURE
Culture: NO GROWTH
Gram Stain: NONE SEEN

## 2014-03-31 LAB — GLUCOSE, CAPILLARY
GLUCOSE-CAPILLARY: 124 mg/dL — AB (ref 70–99)
Glucose-Capillary: 157 mg/dL — ABNORMAL HIGH (ref 70–99)

## 2014-03-31 LAB — PROTIME-INR
INR: 3.63 — AB (ref 0.00–1.49)
PROTHROMBIN TIME: 36.4 s — AB (ref 11.6–15.2)

## 2014-03-31 MED ORDER — INSULIN ASPART 100 UNIT/ML ~~LOC~~ SOLN
0.0000 [IU] | Freq: Three times a day (TID) | SUBCUTANEOUS | Status: DC
  Administered 2014-03-31: 3 [IU] via SUBCUTANEOUS

## 2014-03-31 MED ORDER — INSULIN ASPART 100 UNIT/ML ~~LOC~~ SOLN: 0.0000 [IU] | Freq: Every day | SUBCUTANEOUS | Status: DC

## 2014-03-31 NOTE — Progress Notes (Signed)
I spoke with Dr. Cyndia Bent regarding [name]'s discharge for tomorrow.  He states she should not be discharged on a Beta Blocker due to her low to normal blood pressure.  Also she may resume her home regimen of Coumadin.  We will see her back in our office in 2 weeks.  Appointment will be placed in the chart.   Briellah Baik PA-C

## 2014-03-31 NOTE — Progress Notes (Signed)
Patient trans in from [facility] to 2W07. Alert, oriented and independent. Old incision to the mid chest CDI. Placed on the heart monitor and oriented her to the room. Call bell placed within reach.

## 2014-03-31 NOTE — Progress Notes (Signed)
Patient Name: Anna Salinas Date of Encounter: 03/31/2014  Principal Problem:   Pericardial effusion Active Problems:   Obesity   Aortic valve replaced   Long-term (current) use of anticoagulants   Diabetes   Primary Cardiologist: Dr. Martinique  Patient Profile: 44 yo female w/ hx AVR 02/2104, on coumadin, who was admitted 03/12 w/ pericardial & pleural effusions, and got a window, plus pleural effusion was drained.  SUBJECTIVE: Getting better, lives w/ husband and children, knows she will need to take it easy after d/c.  OBJECTIVE Filed Vitals:   03/31/14 0400 03/31/14 0500 03/31/14 0600 03/31/14 0801  BP: 105/69  101/60   Pulse: 80 84 80   Temp:    97.4 F (36.3 C)  TempSrc:    Oral  Resp: 18 18 16    Height:      Weight:      SpO2: 96% 95% 95%     Intake/Output Summary (Last 24 hours) at 03/31/14 0939 Last data filed at 03/31/14 0600  Gross per 24 hour  Intake    920 ml  Output   3771 ml  Net  -2851 ml   Filed Weights   03/26/14 1805 03/27/14 0600 03/29/14 0400  Weight: 241 lb (109.317 kg) 238 lb 5.1 oz (108.1 kg) 238 lb 14.4 oz (108.364 kg)    PHYSICAL EXAM General: Well developed, well nourished, female in no acute distress. Head: Normocephalic, atraumatic.  Neck: Supple without bruits, JVD minimal elevation. Lungs:  Resp regular and unlabored, decreased BS R base, bronchial BS L. Heart: RRR, S1, S2, no S3, S4, Crisp valve click, soft murmur; no rub. Abdomen: Soft, non-tender, non-distended, BS + x 4.  Extremities: No clubbing, cyanosis, no edema.  Neuro: Alert and oriented X 3. Moves all extremities spontaneously. Psych: Normal affect.  LABS: CBC: INR: Recent Labs  03/31/14 0258  INR 3.63*   BNP:  B NATRIURETIC PEPTIDE  Date/Time Value Ref Range Status  03/26/2014 10:50 AM 272.0* 0.0 - 100.0 pg/mL Final    TELE:  SR      Radiology/Studies: Dg Chest 2 View 03/31/2014   CLINICAL DATA:  Pleural effusion  EXAM: CHEST  2 VIEW   COMPARISON:  March 30, 2014  FINDINGS: Central catheter has been with. Right chest tube is been removed. There is no demonstrable pneumothorax. There is a persistent left pleural effusion with left lower lobe consolidation. Right lung is clear. Heart is enlarged with pulmonary vascularity within normal limits. Patient is status post internal mammary bypass grafting as well as aortic valve replacement. No adenopathy. No bone lesions.  IMPRESSION: Left lower lobe patchy consolidation with small left effusion. Right lung clear. Stable cardiomegaly. No pneumothorax.   Electronically Signed   By: Lowella Grip III M.D.   On: 03/31/2014 07:56   Dg Chest Port 1 View 03/30/2014   CLINICAL DATA:  Post pericardial window creation in drainage of pleural effusion.  EXAM: PORTABLE CHEST - 1 VIEW  COMPARISON:  03/29/2014; 03/28/2014; 03/27/2014  FINDINGS: Grossly unchanged enlarged cardiac silhouette and mediastinal contours post median sternotomy and aortic valve replacement. Stable position of support apparatus peer no pneumothorax. Improved aeration of lungs with bibasilar heterogeneous opacities, left greater than right. Trace bilateral effusions are not excluded. Unchanged bones.  IMPRESSION: 1.  Stable positioning of support apparatus.  No pneumothorax. 2. Minimally improved aeration along suggests resolving edema and atelectasis. 3. Residual bibasilar opacities, left greater than right, likely atelectasis.   Electronically Signed   By: Jenny Reichmann  Watts M.D.   On: 03/30/2014 07:59     Current Medications:  . furosemide  20 mg Oral Daily  . potassium chloride  20 mEq Oral BID  . senna-docusate  1 tablet Oral QHS  . warfarin  2.5 mg Oral q1800  . Warfarin - Physician Dosing Inpatient   Does not apply q1800      ASSESSMENT AND PLAN: Principal Problem:   Pericardial effusion - See TCTS note, likely d/c in am  Active Problems:   Obesity - DM diet    Aortic valve replaced - doing well    Long-term  (current) use of anticoagulants - INR therapeutic, follow closely as OP    Diabetes - add SSI - A1c was 8.6, down from 10.3 on 01/28 - MD advise on adding oral medication such as Glucophage, then f/u with primary MD  Plan - all tubes are out, will transfer to telemetry, increase activity, d/c in am if does well.  Jonetta Speak , PA-C 9:39 AM 03/31/2014

## 2014-03-31 NOTE — Progress Notes (Signed)
4 Days Post-Op Procedure(s) (LRB): SUBXYPHOID PERICARDIAL WINDOW (N/A) CHEST TUBE INSERTION (Right) DRAINAGE OF PLEURAL EFFUSION (Right) DRAINAGE OF PERICARDIAL FLUID (N/A) Subjective:  No complaints  Objective: Vital signs in last 24 hours: Temp:  [97.4 F (36.3 C)-98.5 F (36.9 C)] 97.4 F (36.3 C) (03/17 0801) Pulse Rate:  [80-97] 80 (03/17 0600) Cardiac Rhythm:  [-] Normal sinus rhythm (03/17 0400) Resp:  [16-31] 16 (03/17 0600) BP: (97-116)/(43-69) 101/60 mmHg (03/17 0600) SpO2:  [94 %-100 %] 95 % (03/17 0600)  Hemodynamic parameters for last 24 hours:    Intake/Output from previous day: 03/16 0701 - 03/17 0700 In: 920 [P.O.:920] Out: 4201 [Urine:4150; Stool:1; Chest Tube:50] Intake/Output this shift:    General appearance: alert and cooperative Neurologic: intact Heart: regular rate and rhythm, S1, S2 normal, no murmur, click, rub or gallop Lungs: clear to auscultation bilaterally Extremities: edema mild bilat ankle edema Wound: incision ok  Lab Results: No results for input(s): WBC, HGB, HCT, PLT in the last 72 hours. BMET: No results for input(s): NA, K, CL, CO2, GLUCOSE, BUN, CREATININE, CALCIUM in the last 72 hours.  PT/INR:  Recent Labs  03/31/14 0258  LABPROT 36.4*  INR 3.63*   ABG    Component Value Date/Time   PHART 7.429 03/28/2014 0401   HCO3 26.0* 03/28/2014 0401   TCO2 27 03/28/2014 0401   ACIDBASEDEF 3.0* 02/14/2014 1802   O2SAT 96.0 03/28/2014 0401   CBG (last 3)  No results for input(s): GLUCAP in the last 72 hours.  Assessment/Plan: S/P Procedure(s) (LRB): SUBXYPHOID PERICARDIAL WINDOW (N/A) CHEST TUBE INSERTION (Right) DRAINAGE OF PLEURAL EFFUSION (Right) DRAINAGE OF PERICARDIAL FLUID (N/A)  She is doing well. CXR looks better. Small left effusion and basilar atelectasis. Negative 3281 cc yesterday and wt down 3 lbs. Will continue daily lasix and KCL. INR is 3.6. Continue present dose of coumadin and expect INR to drop some.   Plan home tomorrow.   LOS: 5 days    Gaye Pollack 03/31/2014

## 2014-04-01 ENCOUNTER — Telehealth: Payer: Self-pay | Admitting: Nurse Practitioner

## 2014-04-01 ENCOUNTER — Encounter (HOSPITAL_COMMUNITY): Payer: BLUE CROSS/BLUE SHIELD

## 2014-04-01 ENCOUNTER — Telehealth: Payer: Self-pay | Admitting: Cardiovascular Disease

## 2014-04-01 LAB — BASIC METABOLIC PANEL
Anion gap: 4 — ABNORMAL LOW (ref 5–15)
BUN: 5 mg/dL — ABNORMAL LOW (ref 6–23)
CO2: 28 mmol/L (ref 19–32)
Calcium: 8.6 mg/dL (ref 8.4–10.5)
Chloride: 105 mmol/L (ref 96–112)
Creatinine, Ser: 0.65 mg/dL (ref 0.50–1.10)
GFR calc non Af Amer: >90 mL/min (ref >90)
Glucose, Bld: 113 mg/dL — ABNORMAL HIGH (ref 70–99)
POTASSIUM: 3.7 mmol/L (ref 3.5–5.1)
Sodium: 137 mmol/L (ref 135–145)

## 2014-04-01 LAB — CBC
HEMATOCRIT: 34.2 % — AB (ref 36.0–46.0)
HEMOGLOBIN: 10.6 g/dL — AB (ref 12.0–15.0)
MCH: 25.8 pg — ABNORMAL LOW (ref 26.0–34.0)
MCHC: 31 g/dL (ref 30.0–36.0)
MCV: 83.2 fL (ref 78.0–100.0)
Platelets: 189 10*3/uL (ref 150–400)
RBC: 4.11 MIL/uL (ref 3.87–5.11)
RDW: 15.1 % (ref 11.5–15.5)
WBC: 6 10*3/uL (ref 4.0–10.5)

## 2014-04-01 LAB — GLUCOSE, CAPILLARY
GLUCOSE-CAPILLARY: 117 mg/dL — AB (ref 70–99)
Glucose-Capillary: 97 mg/dL (ref 70–99)

## 2014-04-01 LAB — PROTIME-INR
INR: 3.15 — AB (ref 0.00–1.49)
PROTHROMBIN TIME: 32.6 s — AB (ref 11.6–15.2)

## 2014-04-01 MED ORDER — COLCHICINE 0.6 MG PO TABS: 0.6000 mg | ORAL_TABLET | Freq: Two times a day (BID) | ORAL | Status: DC

## 2014-04-01 MED ORDER — WARFARIN SODIUM 5 MG PO TABS: 5.0000 mg | ORAL_TABLET | Freq: Every day | ORAL | Status: DC

## 2014-04-01 MED ORDER — CEPHALEXIN 500 MG PO CAPS: 500.0000 mg | ORAL_CAPSULE | Freq: Four times a day (QID) | ORAL | Status: DC

## 2014-04-01 MED ORDER — COLCHICINE 0.6 MG PO TABS
0.6000 mg | ORAL_TABLET | Freq: Two times a day (BID) | ORAL | Status: DC
  Administered 2014-04-01: 0.6 mg via ORAL
  Filled 2014-04-01 (×2): qty 1

## 2014-04-01 NOTE — Progress Notes (Signed)
       Patient Name: Anna Salinas Date of Encounter: 04/01/2014    SUBJECTIVE: Feels better. Had oozing from most recent sternal incision.  TELEMETRY:  No atrial fibrillation. Filed Vitals:   03/31/14 1500 03/31/14 1639 03/31/14 2029 04/01/14 0523  BP:   122/85 110/54  Pulse: 83  88 81  Temp:  98.5 F (36.9 C) 98.4 F (36.9 C) 98 F (36.7 C)  TempSrc:  Oral Oral Oral  Resp: 22  21 20   Height:      Weight:   225 lb 11.2 oz (102.377 kg)   SpO2: 98%  97% 97%    Intake/Output Summary (Last 24 hours) at 04/01/14 0736 Last data filed at 04/01/14 0500  Gross per 24 hour  Intake    850 ml  Output   3000 ml  Net  -2150 ml   LABS: Basic Metabolic Panel:  Recent Labs  04/01/14 0502  NA 137  K 3.7  CL 105  CO2 28  GLUCOSE 113*  BUN 5*  CREATININE 0.65  CALCIUM 8.6   CBC:  Recent Labs  04/01/14 0502  WBC 6.0  HGB 10.6*  HCT 34.2*  MCV 83.2  PLT 189     Radiology/Studies:  Chest x-ray: IMPRESSION: Left lower lobe patchy consolidation with small left effusion. Right lung clear. Stable cardiomegaly. No pneumothorax.  Physical Exam: Blood pressure 110/54, pulse 81, temperature 98 F (36.7 C), temperature source Oral, resp. rate 20, height 5\' 7"  (1.702 m), weight 225 lb 11.2 oz (102.377 kg), last menstrual period 03/11/2014, SpO2 97 %. Weight change:   Wt Readings from Last 3 Encounters:  03/31/14 225 lb 11.2 oz (102.377 kg)  03/09/14 227 lb (102.967 kg)  03/03/14 222 lb (100.699 kg)   Oozing from the subxiphoid incision Decreased bilateral breath sounds No pericardial rub. Aortic Valve sound is crisp.  ASSESSMENT:  1. Post cardiotomy syndrome with resolved pericardial and pleural effusion after mechanical drainage. 2. Normally functioning mechanical aortic valve based upon clinical auscultation  Plan:  Add colchicine 0.6 mg by mouth twice a day (clear with Dr. Cyndia Bent and pharmacy) Check drainage occurring at subxiphoid incision Possibly home  today  Signed, Sinclair Grooms 04/01/2014, 7:36 AM

## 2014-04-01 NOTE — Telephone Encounter (Signed)
New message      TCM appt on 04-11-14 per Suanne Marker

## 2014-04-01 NOTE — Discharge Summary (Signed)
CARDIOLOGY DISCHARGE SUMMARY   Patient ID: Anna Salinas MRN: [id number] DOB/AGE: 1970-07-17 44 y.o.  Admit date: 03/26/2014 Discharge date: 04/01/2014  PCP: Chevis Pretty, FNP Primary Cardiologist: Dr. Martinique  Primary Discharge Diagnosis:  Pericardial effusion Secondary Discharge Diagnosis:    Obesity   Aortic valve replaced   Long-term (current) use of anticoagulants   Diabetes   Pleural effusion   Post cardiotomy syndrome   Consults: TCTS  Procedures:  SUBXYPHOID PERICARDIAL WINDOW (N/A) CHEST TUBE INSERTION (Right) DRAINAGE OF PLEURAL EFFUSION (Right) DRAINAGE OF PERICARDIAL FLUID (N/A)  Serial chest x-rays 2-D echocardiogram  Hospital Course: Anna Salinas is a 44 y.o. female with a history of AVR 02/2104, on coumadin, who was admitted 03/12 w/ pericardial & pleural effusions.   A surgical consult was called and she was seen by Dr. Servando Snare. She had a subxiphoid pericardial window and drainage of the pericardial fluid. He also felt that her pleural effusion was significant and a chest tube was inserted for drainage of her pleural effusion. She tolerated the procedures well.   Her blood counts and her intake/outputs were followed closely. Her potassium required supplementation and this was done.  Labs:   Lab Results  Component Value Date   WBC 6.0 04/01/2014   HGB 10.6* 04/01/2014   HCT 34.2* 04/01/2014   MCV 83.2 04/01/2014   PLT 189 04/01/2014    Recent Labs Lab 03/26/14 1050  04/01/14 0502  NA 138  < > 137  K 2.9*  < > 3.7  CL 99  < > 105  CO2 29  < > 28  BUN <5*  < > 5*  CREATININE 0.72  < > 0.65  CALCIUM 8.8  < > 8.6  PROT 6.7  --   --   BILITOT 0.8  --   --   ALKPHOS 65  --   --   ALT 27  --   --   AST 41*  --   --   GLUCOSE 178*  < > 113*  < > = values in this interval not displayed.  B NATRIURETIC PEPTIDE  Date/Time Value Ref Range Status  03/26/2014 10:50 AM 272.0* 0.0 - 100.0 pg/mL Final   No results found for:  PROBNP  Recent Labs  04/01/14 0502  INR 3.15*      Radiology: Dg Chest 2 View 03/31/2014   CLINICAL DATA:  Pleural effusion  EXAM: CHEST  2 VIEW  COMPARISON:  March 30, 2014  FINDINGS: Central catheter has been with. Right chest tube is been removed. There is no demonstrable pneumothorax. There is a persistent left pleural effusion with left lower lobe consolidation. Right lung is clear. Heart is enlarged with pulmonary vascularity within normal limits. Patient is status post internal mammary bypass grafting as well as aortic valve replacement. No adenopathy. No bone lesions.  IMPRESSION: Left lower lobe patchy consolidation with small left effusion. Right lung clear. Stable cardiomegaly. No pneumothorax.   Electronically Signed   By: Lowella Grip III M.D.   On: 03/31/2014 07:56   Dg Chest 2 View 03/26/2014   CLINICAL DATA:  Aortic valve replacement on have door a first.  EXAM: CHEST  2 VIEW  COMPARISON:  03/23/2014  FINDINGS: The patient is status post median sternotomy and aortic valve replacement. The heart size is moderately enlarged. There is a moderate to large right pleural effusion which is unchanged from previous exam decrease in left pleural effusion. Atelectasis noted in the left base.  IMPRESSION: 1. Persistent right pleural effusion. 2. Interval improvement in left effusion.   Electronically Signed   By: Kerby Moors M.D.   On: 03/26/2014 12:12   Dg Chest 2 View 03/23/2014   CLINICAL DATA:  Aortic stenosis. Aortic valve replacement. Shortness breath. Cough.  EXAM: CHEST  2 VIEW  COMPARISON:  02/17/2014 appear  FINDINGS: Mediastinum and hilar structures are unremarkable. Prior aortic valve replacement. Cardiomegaly. No pulmonary venous congestion. Low lung volumes with bibasilar atelectasis. Right lower lobe infiltrate cannot be excluded. Bilateral pleural effusion. Developing congestive heart failure and or pneumonia cannot be excluded. No pneumothorax.  IMPRESSION: 1. Prior aortic  valve replacement.  Prominent cardiomegaly. 2. Low lung volumes with bibasilar atelectasis. Right lower lobe infiltrate cannot be excluded. Bilateral small pleural effusions are present. Developing congestive heart failure and/or pneumonia cannot be excluded.   Electronically Signed   By: Marcello Moores  Register   On: 03/23/2014 08:48   Dg Chest Port 1 View 03/30/2014   CLINICAL DATA:  Post pericardial window creation in drainage of pleural effusion.  EXAM: PORTABLE CHEST - 1 VIEW  COMPARISON:  03/29/2014; 03/28/2014; 03/27/2014  FINDINGS: Grossly unchanged enlarged cardiac silhouette and mediastinal contours post median sternotomy and aortic valve replacement. Stable position of support apparatus peer no pneumothorax. Improved aeration of lungs with bibasilar heterogeneous opacities, left greater than right. Trace bilateral effusions are not excluded. Unchanged bones.  IMPRESSION: 1.  Stable positioning of support apparatus.  No pneumothorax. 2. Minimally improved aeration along suggests resolving edema and atelectasis. 3. Residual bibasilar opacities, left greater than right, likely atelectasis.   Electronically Signed   By: Sandi Mariscal M.D.   On: 03/30/2014 07:59   Dg Chest Port 1 View 03/29/2014   CLINICAL DATA:  Pericardial effusion.  EXAM: PORTABLE CHEST - 1 VIEW  COMPARISON:  March 28, 2014.  FINDINGS: Stable cardiomegaly. Right-sided chest tube is unchanged in position with no evidence of pneumothorax. Stable bilateral pleural effusions are noted. Right internal jugular catheter is unchanged with distal tip overlying expected position of the SVC.  IMPRESSION: No significant change compared to prior exam. Stable bilateral pleural effusions. Right-sided chest tube is noted without evidence of pneumothorax.   Electronically Signed   By: Marijo Conception, M.D.   On: 03/29/2014 08:42   Dg Chest Port 1 View 03/28/2014   CLINICAL DATA:  Sub xiphoid pericardial window  EXAM: PORTABLE CHEST - 1 VIEW  COMPARISON:   03/27/2014  FINDINGS: Chest tubes remain. Right jugular central line extends into the low SVC. No pneumothorax is evident. There is moderate unchanged cardiomegaly. Basilar opacities persist bilaterally without significant interval change.  IMPRESSION: Unchanged chest tubes and right jugular central line. Unchanged basilar opacities.   Electronically Signed   By: Andreas Newport M.D.   On: 03/28/2014 06:50   Dg Chest Port 1 View 03/27/2014   CLINICAL DATA:  Status post chest tube insertion.  EXAM: PORTABLE CHEST - 1 VIEW  COMPARISON:  03/26/2014  FINDINGS: There is a right IJ catheter with tip in the projection of the SVC. Right-sided chest tube is in place. There has been mild decrease in volume of the right plural effusion. There is a new drain identified in the projection of the mediastinum and left lung base. New left pleural fluid and diminished aeration to the left lung base.  IMPRESSION: 1. No pneumothorax after right chest tube placement. There has been mild decrease in volume right effusion. 2. New left pleural effusion with diminished aeration to the  left lower lobe.   Electronically Signed   By: Kerby Moors M.D.   On: 03/27/2014 10:15    Echo: 03/26/2014 Study Conclusions - Left ventricle: The cavity size was normal. Wall thickness was normal. Systolic function was vigorous. The estimated ejection fraction was in the range of 65% to 70%. Wall motion was normal; there were no regional wall motion abnormalities. - Aortic valve: A [employer] mechanical prosthesis was present. No obvious perivalvular leak based on limited views. There was no significant regurgitation. Mean gradient (S): 15 mm Hg. Peak gradient (S): 28 mm Hg. - Right ventricle: The cavity size was below normal. - Tricuspid valve: There was trivial regurgitation. - Pulmonary arteries: Systolic pressure could not be accurately estimated. - Pericardium, extracardiac: A large pericardial effusion  was identified circumferential to the heart, measures approximately 4 cm posteriorly and 3 cm anteriorly. There is compression of the right ventricle noted in the subcostal view and further evidence of tamponade physiology based on respiratory variation in mitral inflow. There was a right pleural effusion. Impressions: - Normal LV wall thickness with LVEF 65-70%. [employer] mechanical AVR without obvious perivalvular leak based on limited and with grossly normal gradients. A large pericardial effusion was identified circumferential to the heart, measures approximately 4 cm posteriorly and 3-4 cm anteriorly. There is compression of the right ventricle noted in the subcostal view and further evidence of tamponade physiology based on respiratory variation in mitral inflow. Right pleural effusion also noted. Results discussed with Dr. Servando Snare.  FOLLOW UP PLANS AND APPOINTMENTS Allergies  Allergen Reactions  . Glipizide Nausea And Vomiting and Other (See Comments)    GI upset  . Metoprolol Other (See Comments)    "Made me sick"  . Shellfish Allergy Swelling    Throat and eyes were swollen.  Had difficulty breathing.  Was hospitalized.       Medication List    STOP taking these medications        metoprolol tartrate 25 MG tablet  Commonly known as:  LOPRESSOR      TAKE these medications        acetaminophen 325 MG tablet  Commonly known as:  TYLENOL  Take 650 mg by mouth every 6 (six) hours as needed (pain).     cephALEXin 500 MG capsule  Commonly known as:  KEFLEX  Take 1 capsule (500 mg total) by mouth 4 (four) times daily. For 1 week     colchicine 0.6 MG tablet  Take 1 tablet (0.6 mg total) by mouth 2 (two) times daily.     CORICIDIN HBP COUGH/COLD PO  Take 1 tablet by mouth as needed (for cough).     furosemide 20 MG tablet  Commonly known as:  LASIX  Take 1 to 2 tablets by mouth daily as directed     oxyCODONE 5 MG immediate release  tablet  Commonly known as:  Oxy IR/ROXICODONE  Take 1-2 tablets (5-10 mg total) by mouth every 6 (six) hours as needed for severe pain.     Potassium Chloride ER 20 MEQ Tbcr  Take 20 mEq by mouth daily.     warfarin 5 MG tablet  Commonly known as:  COUMADIN  Take 1 tablet (5 mg total) by mouth daily. 03/17/14:  Pt states she takes Coumadin 5mg  on Monday then 2.5mg  all other days of the week.        Discharge Instructions    (HEART FAILURE PATIENTS) Call MD:  Anytime you have any of  the following symptoms: 1) 3 pound weight gain in 24 hours or 5 pounds in 1 week 2) shortness of breath, with or without a dry hacking cough 3) swelling in the hands, feet or stomach 4) if you have to sleep on extra pillows at night in order to breathe.    Complete by:  As directed      Diet - low sodium heart healthy    Complete by:  As directed      Diet Carb Modified    Complete by:  As directed      Increase activity slowly    Complete by:  As directed           Follow-up Information    Follow up with Gaye Pollack, MD On 04/20/2014.   Specialty:  Cardiothoracic Surgery   Why:  Appointment is at 12:30   Contact information:   [place] [address] [phone]       Follow up with [facility] On 04/20/2014.   Why:  Please get CXR at 12;00   Contact information:   [facility]       Follow up with [facility] In 1 week.   Why:  for wound check, office will contact you with appointment   Contact information:   [place] [address]       Follow up with Peter Martinique, MD.   Specialty:  Cardiology   Why:  The office will call.   Contact information:   [address] [place] [address] [phone]       BRING ALL MEDICATIONS WITH YOU TO FOLLOW UP APPOINTMENTS  Time spent with patient to include physician time: 45 min Signed: Rosaria Ferries, PA-C 04/01/2014, 1:54 PM Co-Sign  MD  The patient was seen and examined and discussed with Mrs. Barrett PA-C. Adequate paracardial and pleural drainage has been achieved with mechanical means. She is able to ambulate and feels much better than upon admission. She was started on coaches seen to serve as an anti-inflammatory and hopefully help prevent re-accumulation of fluid. She is said to follow-up with T[facility], and Dr. Peter Martinique in the coming weeks. She should call if increased shortness of breath.  The details of the dictation/discharge summary have been reviewed and are accurate as dictated.  Cheral Almas Blenda Bridegroom, M.D. FACC

## 2014-04-01 NOTE — Progress Notes (Signed)
Pt d/c home.  Alert and oriented x4.  No c/o pain.  IV D/Cd Tele D/Cd.  Education given to pt on diet, activity, meds, and follow-up care and instructions.  Pt verbalized understanding.  Instructions given to pt regarding wound care and when to call the doctor.  Pt was taken home by her mother and will follow up for a wound check with CT surgery outpatient.

## 2014-04-01 NOTE — Progress Notes (Signed)
      [address]       [place] [address]             [phone]      5 Days Post-Op Procedure(s) (LRB): SUBXYPHOID PERICARDIAL WINDOW (N/A) CHEST TUBE INSERTION (Right) DRAINAGE OF PLEURAL EFFUSION (Right) DRAINAGE OF PERICARDIAL FLUID (N/A)   Subjective:  Ms. Corella has no complaints this morning.  She does have drainage from the lower portion of her subxiphoid incision.  Objective: Vital signs in last 24 hours: Temp:  [97.4 F (36.3 C)-98.5 F (36.9 C)] 98 F (36.7 C) (03/18 0523) Pulse Rate:  [81-88] 81 (03/18 0523) Cardiac Rhythm:  [-] Normal sinus rhythm (03/18 0001) Resp:  [17-30] 20 (03/18 0523) BP: (97-122)/(49-85) 110/54 mmHg (03/18 0523) SpO2:  [96 %-100 %] 97 % (03/18 0523) Weight:  [225 lb 11.2 oz (102.377 kg)] 225 lb 11.2 oz (102.377 kg) (03/17 2029)  Intake/Output from previous day: 03/17 0701 - 03/18 0700 In: 850 [P.O.:840; I.V.:10] Out: 3000 [Urine:3000]  General appearance: alert, cooperative and no distress Heart: regular rate and rhythm Lungs: clear to auscultation bilaterally Abdomen: soft, non-tender; bowel sounds normal; no masses,  no organomegaly Extremities: extremities normal, atraumatic, no cyanosis or edema Wound: clean, some serous drainage from incision, no signs of infection  Lab Results:  Recent Labs  04/01/14 0502  WBC 6.0  HGB 10.6*  HCT 34.2*  PLT 189   BMET:  Recent Labs  04/01/14 0502  NA 137  K 3.7  CL 105  CO2 28  GLUCOSE 113*  BUN 5*  CREATININE 0.65  CALCIUM 8.6    PT/INR:  Recent Labs  04/01/14 0502  LABPROT 32.6*  INR 3.15*   ABG    Component Value Date/Time   PHART 7.429 03/28/2014 0401   HCO3 26.0* 03/28/2014 0401   TCO2 27 03/28/2014 0401   ACIDBASEDEF 3.0* 02/14/2014 1802   O2SAT 96.0 03/28/2014 0401   CBG (last 3)   Recent Labs  03/31/14 1725 03/31/14 2100 04/01/14 0612  GLUCAP 157* 124* 117*    Assessment/Plan: S/P Procedure(s) (LRB): SUBXYPHOID  PERICARDIAL WINDOW (N/A) CHEST TUBE INSERTION (Right) DRAINAGE OF PLEURAL EFFUSION (Right) DRAINAGE OF PERICARDIAL FLUID (N/A)  1. CV- hemodynamically stable, continue to hold beta blocker for low to normal blood pressure 2. Pulm- remains stable, off oxygen 3. INR 3.15- would resume home regimen of Coumadin 4. ID- drainage from sub xiphoid incision, does not appear to be infection, most likely fat necrosis, however we will treat with a prophylaxis of Keflex and recheck her wound in 1 week.  5. Dispo- patient is stable, okay to d/c from ourstandpoint, Keflex script on chart, office will contact patient with wound check appointment   LOS: 6 days    Ellwood Handler 04/01/2014

## 2014-04-04 ENCOUNTER — Encounter (HOSPITAL_COMMUNITY): Payer: BLUE CROSS/BLUE SHIELD

## 2014-04-04 NOTE — Telephone Encounter (Signed)
Closed encounter °

## 2014-04-04 NOTE — Telephone Encounter (Signed)
Appointment scheduled for 3/31 with Ronnald Collum, FNP

## 2014-04-04 NOTE — Telephone Encounter (Signed)
LMTCB

## 2014-04-04 NOTE — Telephone Encounter (Signed)
**Note De-Identified [facility] Obfuscation** Patient contacted regarding discharge from [facility] on 04/01/14.  Patient understands to follow up with provider Truitt Merle, NP on 04/11/14 at 9 am at [facility] [place], [place], [place]. Patient understands discharge instructions? yes Patient understands medications and regiment? yes  Patient understands to bring all medications to this visit? yes  The pt states that she has a clear drainage from surgical site. She denies redness, swelling or fever at site.   She states that she is doing well and has our phone number is she has any questions or concerns.

## 2014-04-05 DIAGNOSIS — E119 Type 2 diabetes mellitus without complications: Secondary | ICD-10-CM | POA: Insufficient documentation

## 2014-04-05 DIAGNOSIS — Z9689 Presence of other specified functional implants: Secondary | ICD-10-CM | POA: Insufficient documentation

## 2014-04-05 DIAGNOSIS — Z794 Long term (current) use of insulin: Secondary | ICD-10-CM

## 2014-04-06 ENCOUNTER — Encounter (HOSPITAL_COMMUNITY): Payer: BLUE CROSS/BLUE SHIELD

## 2014-04-07 ENCOUNTER — Ambulatory Visit (INDEPENDENT_AMBULATORY_CARE_PROVIDER_SITE_OTHER): Payer: Self-pay | Admitting: Physician Assistant

## 2014-04-07 VITALS — BP 115/76 | HR 87 | Temp 97.3°F | Resp 20 | Ht 67.0 in | Wt 216.0 lb

## 2014-04-07 DIAGNOSIS — Z954 Presence of other heart-valve replacement: Secondary | ICD-10-CM

## 2014-04-07 DIAGNOSIS — I3139 Other pericardial effusion (noninflammatory): Secondary | ICD-10-CM

## 2014-04-07 DIAGNOSIS — I350 Nonrheumatic aortic (valve) stenosis: Secondary | ICD-10-CM

## 2014-04-07 DIAGNOSIS — I313 Pericardial effusion (noninflammatory): Secondary | ICD-10-CM

## 2014-04-07 DIAGNOSIS — Z952 Presence of prosthetic heart valve: Secondary | ICD-10-CM

## 2014-04-07 DIAGNOSIS — J90 Pleural effusion, not elsewhere classified: Secondary | ICD-10-CM

## 2014-04-07 DIAGNOSIS — J948 Other specified pleural conditions: Secondary | ICD-10-CM

## 2014-04-07 DIAGNOSIS — I319 Disease of pericardium, unspecified: Secondary | ICD-10-CM

## 2014-04-07 NOTE — Progress Notes (Signed)
  HPI: Patient returns for routine postoperative follow-up having undergone Sub Xiphoid Pericardial Window on 03/27/2014.  The patient's early postoperative recovery while in the hospital was notable for serous drainage from incision which appeared to be fat necrosis.  She was given Keflex empirically.  The patient presents today for 1 week follow up.  She states she is doing well.  She states her drainage from her incision has almost completely resolved.  She has been keeping the wound clean and dry as instructed.   Current Outpatient Prescriptions  Medication Sig Dispense Refill  . acetaminophen (TYLENOL) 325 MG tablet Take 650 mg by mouth every 6 (six) hours as needed (pain).    . cephALEXin (KEFLEX) 500 MG capsule Take 1 capsule (500 mg total) by mouth 4 (four) times daily. For 1 week 28 capsule 0  . Chlorpheniramine-DM (CORICIDIN HBP COUGH/COLD PO) Take 1 tablet by mouth as needed (for cough).    . colchicine 0.6 MG tablet Take 1 tablet (0.6 mg total) by mouth 2 (two) times daily. 60 tablet 1  . furosemide (LASIX) 20 MG tablet Take 1 to 2 tablets by mouth daily as directed 45 tablet 3  . oxyCODONE (OXY IR/ROXICODONE) 5 MG immediate release tablet Take 1-2 tablets (5-10 mg total) by mouth every 6 (six) hours as needed for severe pain. 40 tablet 0  . Potassium Chloride ER 20 MEQ TBCR Take 20 mEq by mouth daily. 30 tablet 3  . warfarin (COUMADIN) 5 MG tablet Take 1 tablet (5 mg total) by mouth daily. 03/17/14:  Pt states she takes Coumadin 5mg  on Monday then 2.5mg  all other days of the week. 40 tablet 1   No current facility-administered medications for this visit.    Physical Exam:  BP 115/76 mmHg  Pulse 87  Temp(Src) 97.3 F (36.3 C) (Oral)  Resp 20  Ht 5\' 7"  (1.702 m)  Wt 216 lb (97.977 kg)  BMI 33.82 kg/m2  SpO2 98%  LMP 03/11/2014  Gen: no apparent distress Heart: RRR Lungs: CTA bilaterally Skin: incision is clean and dry, chest tube sutures remain in place  A/P:  1. S/P  Subxiphoid Pericardial Window -doing well 2. Fat necrosis of surgical incision- healing well, little to no serous drainage present. Instructed patient to continue Keflex until gone.  3. Removed chest tube sutures 4. Dispo- patient will RTC on 4/6 as scheduled to see Dr. Cyndia Bent.  She was instructed to contact our office sooner should her drainage worsen or turn purulent.  Ellwood Handler, PA-C [facility] [phone]

## 2014-04-08 ENCOUNTER — Ambulatory Visit (INDEPENDENT_AMBULATORY_CARE_PROVIDER_SITE_OTHER): Payer: BLUE CROSS/BLUE SHIELD | Admitting: Pharmacist Clinician (PhC)/ Clinical Pharmacy Specialist

## 2014-04-08 ENCOUNTER — Encounter (HOSPITAL_COMMUNITY): Payer: BLUE CROSS/BLUE SHIELD

## 2014-04-08 DIAGNOSIS — Z954 Presence of other heart-valve replacement: Secondary | ICD-10-CM

## 2014-04-08 DIAGNOSIS — Z7901 Long term (current) use of anticoagulants: Secondary | ICD-10-CM | POA: Diagnosis not present

## 2014-04-08 DIAGNOSIS — Z952 Presence of prosthetic heart valve: Secondary | ICD-10-CM

## 2014-04-08 LAB — POCT INR: INR: 2.2

## 2014-04-11 ENCOUNTER — Encounter: Payer: Self-pay | Admitting: Nurse Practitioner

## 2014-04-11 ENCOUNTER — Encounter (HOSPITAL_COMMUNITY): Payer: BLUE CROSS/BLUE SHIELD

## 2014-04-11 ENCOUNTER — Ambulatory Visit (INDEPENDENT_AMBULATORY_CARE_PROVIDER_SITE_OTHER): Payer: BLUE CROSS/BLUE SHIELD | Admitting: Nurse Practitioner

## 2014-04-11 VITALS — BP 98/60 | HR 73 | Ht 67.0 in | Wt 219.0 lb

## 2014-04-11 DIAGNOSIS — I10 Essential (primary) hypertension: Secondary | ICD-10-CM

## 2014-04-11 DIAGNOSIS — I319 Disease of pericardium, unspecified: Secondary | ICD-10-CM | POA: Diagnosis not present

## 2014-04-11 DIAGNOSIS — Z952 Presence of prosthetic heart valve: Secondary | ICD-10-CM

## 2014-04-11 DIAGNOSIS — Z954 Presence of other heart-valve replacement: Secondary | ICD-10-CM

## 2014-04-11 DIAGNOSIS — I313 Pericardial effusion (noninflammatory): Secondary | ICD-10-CM

## 2014-04-11 DIAGNOSIS — I3139 Other pericardial effusion (noninflammatory): Secondary | ICD-10-CM

## 2014-04-11 NOTE — Patient Instructions (Signed)
We will be checking the following labs today BMET and CBC  Stay on your current medicines  We will get a follow up echocardiogram arranged.  See Dr. Martinique and Dr. Cyndia Bent as planned  Call the [place] office at [phone] if you have any questions, problems or concerns.

## 2014-04-11 NOTE — Progress Notes (Signed)
CARDIOLOGY OFFICE NOTE  Date:  04/11/2014    Anna Salinas Date of Birth: 19-Jan-1970 Medical Record #[id number]  PCP:  Chevis Pretty, FNP  Cardiologist:  Martinique    Chief Complaint  Patient presents with  . Post hospital - s/p pericardial window    Seen for Dr. Martinique.      History of Present Illness: Anna Salinas is a 44 y.o. female who presents today for a post hospital/TOC visit. Seen for Dr. Martinique. She has a history of AVR in 02/2014. She has been on coumadin.  Presented earlier this month with pericardial and pleural effusions. Had a subxiphoid pericardial window and drainage of the pericardial fluid. Required chest tube for her pleural effusion.   Comes back today. Here with her son. She is doing ok. No chest pain. Breathing "is such a great improvement". No cough. No swelling. Not dizzy or lightheaded. Seeing PCP later this week - this will be to address her diabetes. She has finished her Keflex - was put on for some drainage from her incision - this has caused some loose stools.   Past Medical History  Diagnosis Date  . Essential hypertension, benign   . Palpitations   . Aortic stenosis due to bicuspid aortic valve   . Type 2 diabetes mellitus     Past Surgical History  Procedure Laterality Date  . Cesarean section  2009  . Tubal ligation    . Left and right heart catheterization with coronary angiogram N/A 01/25/2014    Procedure: LEFT AND RIGHT HEART CATHETERIZATION WITH CORONARY ANGIOGRAM;  Surgeon: Peter M Martinique, MD;  Location: [facility] CATH LAB;  Service: Cardiovascular;  Laterality: N/A;  . Cardiac catheterization    . Aortic valve replacement N/A 02/14/2014    Procedure: AORTIC VALVE REPLACEMENT (AVR);  Surgeon: Gaye Pollack, MD;  Location: [place];  Service: Open Heart Surgery;  Laterality: N/A;  . Intraoperative transesophageal echocardiogram N/A 02/14/2014    Procedure: INTRAOPERATIVE TRANSESOPHAGEAL ECHOCARDIOGRAM;  Surgeon: Gaye Pollack, MD;   Location: [facility] OR;  Service: Open Heart Surgery;  Laterality: N/A;  . Subxyphoid pericardial window N/A 03/27/2014    Procedure: SUBXYPHOID PERICARDIAL WINDOW;  Surgeon: Grace Isaac, MD;  Location: [facility];  Service: Thoracic;  Laterality: N/A;  . Chest tube insertion Right 03/27/2014    Procedure: CHEST TUBE INSERTION;  Surgeon: Grace Isaac, MD;  Location: [place];  Service: Thoracic;  Laterality: Right;  . Pleural effusion drainage Right 03/27/2014    Procedure: DRAINAGE OF PLEURAL EFFUSION;  Surgeon: Grace Isaac, MD;  Location: [place];  Service: Thoracic;  Laterality: Right;  Drainage of right pleural effusion  . Pericardial fluid drainage N/A 03/27/2014    Procedure: DRAINAGE OF PERICARDIAL FLUID;  Surgeon: Grace Isaac, MD;  Location: [facility] OR;  Service: Thoracic;  Laterality: N/A;     Medications: Current Outpatient Prescriptions  Medication Sig Dispense Refill  . acetaminophen (TYLENOL) 325 MG tablet Take 650 mg by mouth every 6 (six) hours as needed (pain).    . colchicine 0.6 MG tablet Take 1 tablet (0.6 mg total) by mouth 2 (two) times daily. 60 tablet 1  . furosemide (LASIX) 20 MG tablet Take 1 to 2 tablets by mouth daily as directed 45 tablet 3  . oxyCODONE (OXY IR/ROXICODONE) 5 MG immediate release tablet Take 1-2 tablets (5-10 mg total) by mouth every 6 (six) hours as needed for severe pain. 40 tablet 0  . Potassium Chloride ER  20 MEQ TBCR Take 20 mEq by mouth daily. 30 tablet 3  . warfarin (COUMADIN) 5 MG tablet Take 1 tablet (5 mg total) by mouth daily. 03/17/14:  Pt states she takes Coumadin 5mg  on Monday then 2.5mg  all other days of the week. 40 tablet 1   No current facility-administered medications for this visit.    Allergies: Allergies  Allergen Reactions  . Glipizide Nausea And Vomiting and Other (See Comments)    GI upset  . Metoprolol Other (See Comments)    "Made me sick"  . Shellfish Allergy Swelling    Throat and eyes were swollen.  Had difficulty  breathing.  Was hospitalized.      Social History: The patient  reports that she has never smoked. She has never used smokeless tobacco. She reports that she does not drink alcohol or use illicit drugs.   Family History: The patient's family history includes Brain cancer in her brother; Cancer in her maternal grandmother; Diabetes in her mother.   Review of Systems: Please see the history of present illness.   Otherwise, the review of systems is positive for diarrhea.   All other systems are reviewed and negative.   Physical Exam: VS:  BP 98/60 mmHg  Pulse 73  Ht 5\' 7"  (1.702 m)  Wt 219 lb (99.338 kg)  BMI 34.29 kg/m2  LMP 03/11/2014 .  BMI Body mass index is 34.29 kg/(m^2).  Wt Readings from Last 3 Encounters:  04/11/14 219 lb (99.338 kg)  04/07/14 216 lb (97.977 kg)  03/31/14 225 lb 11.2 oz (102.377 kg)    General: Pleasant. Well developed, well nourished and in no acute distress.  HEENT: Normal. Neck: Supple, no JVD, carotid bruits, or masses noted.  Cardiac: Regular rate and rhythm. No murmurs, rubs, or gallops. Her valve is crisp. Sternum and lower chest looks ok. No edema.  Respiratory:  Lungs are clear to auscultation bilaterally with normal work of breathing. Little decreased in the bases.  GI: Soft and nontender.  MS: No deformity or atrophy. Gait and ROM intact. Skin: Warm and dry. Color is normal.  Neuro:  Strength and sensation are intact and no gross focal deficits noted.  Psych: Alert, appropriate and with normal affect.   LABORATORY DATA:  EKG:  EKG is ordered today. This demonstrates NSR.  Lab Results  Component Value Date   WBC 6.0 04/01/2014   HGB 10.6* 04/01/2014   HCT 34.2* 04/01/2014   PLT 189 04/01/2014   GLUCOSE 113* 04/01/2014   ALT 27 03/26/2014   AST 41* 03/26/2014   NA 137 04/01/2014   K 3.7 04/01/2014   CL 105 04/01/2014   CREATININE 0.65 04/01/2014   BUN 5* 04/01/2014   CO2 28 04/01/2014   INR 2.2 04/08/2014   HGBA1C 8.6*  03/26/2014    BNP (last 3 results)  Recent Labs  03/26/14 1050  BNP 272.0*    ProBNP (last 3 results) No results for input(s): PROBNP in the last 8760 hours.   Other Studies Reviewed Today:   Echo: 03/26/2014 Study Conclusions - Left ventricle: The cavity size was normal. Wall thickness was normal. Systolic function was vigorous. The estimated ejection fraction was in the range of 65% to 70%. Wall motion was normal; there were no regional wall motion abnormalities. - Aortic valve: A [employer]. [employer] mechanical prosthesis was present. No obvious perivalvular leak based on limited views. There was no significant regurgitation. Mean gradient (S): 15 mm Hg. Peak gradient (S): 28 mm Hg. -  Right ventricle: The cavity size was below normal. - Tricuspid valve: There was trivial regurgitation. - Pulmonary arteries: Systolic pressure could not be accurately estimated. - Pericardium, extracardiac: A large pericardial effusion was identified circumferential to the heart, measures approximately 4 cm posteriorly and 3 cm anteriorly. There is compression of the right ventricle noted in the subcostal view and further evidence of tamponade physiology based on respiratory variation in mitral inflow. There was a right pleural effusion. Impressions: - Normal LV wall thickness with LVEF 65-70%. St. Jude mechanical AVR without obvious perivalvular leak based on limited and with grossly normal gradients. A large pericardial effusion was identified circumferential to the heart, measures approximately 4 cm posteriorly and 3-4 cm anteriorly. There is compression of the right ventricle noted in the subcostal view and further evidence of tamponade physiology based on respiratory variation in mitral inflow. Right pleural effusion also noted. Results discussed with Dr. Servando Snare.  Assessment/Plan: 1. Pericardial and pleural effusion - s/p window and chest  tube placement - looks good and doing well. Get follow up limited echo. See Dr. Martinique and Dr. Cyndia Bent back as planned. Getting repeat CXR with her OV with Dr. Cyndia Bent. Recheck follow up labs today.   2. Newly diagnosed diabetes - has not seen PCP yet - not on treatment - to be seen later this week.   3. Recent AVR  4. Chronic coumadin therapy  Current medicines are reviewed with the patient today.  The patient does not have concerns regarding medicines other than what has been noted above.  The following changes have been made:  See above.  Labs/ tests ordered today include:    Orders Placed This Encounter  Procedures  . EKG 12-Lead     Disposition:   FU with Dr. Martinique as planned. Patient is agreeable to this plan and will call if any problems develop in the interim.   Signed: Burtis Junes, RN, ANP-C 04/11/2014 9:49 AM  [facility] [address] [place], [place]  [address] Phone: [phone] Fax: [phone]

## 2014-04-12 ENCOUNTER — Telehealth: Payer: Self-pay

## 2014-04-12 DIAGNOSIS — E119 Type 2 diabetes mellitus without complications: Secondary | ICD-10-CM

## 2014-04-12 DIAGNOSIS — I10 Essential (primary) hypertension: Secondary | ICD-10-CM

## 2014-04-12 DIAGNOSIS — I970 Postcardiotomy syndrome: Secondary | ICD-10-CM

## 2014-04-12 NOTE — Telephone Encounter (Signed)
Received a call from [place] with [facility].She stated patient needs a lipid panel done.Advised I will put order in.Patient called she will have fasting lipid panel done 04/13/14 at [facility] in [place].

## 2014-04-12 NOTE — Addendum Note (Signed)
Encounter addended by: Cathie Olden, RN on: 04/12/2014  3:29 PM<BR>     Documentation filed: Notes Section

## 2014-04-12 NOTE — Progress Notes (Signed)
Dr. Karl Luke office was called to request Lipid profile to finish Cardiac Rehab charting. Office stated they will call patient to get labs done.

## 2014-04-13 ENCOUNTER — Encounter (HOSPITAL_COMMUNITY): Payer: BLUE CROSS/BLUE SHIELD

## 2014-04-13 LAB — LIPID PANEL
CHOL/HDL RATIO: 8.6 ratio
Cholesterol: 181 mg/dL (ref 0–200)
HDL: 21 mg/dL — AB (ref >=46)
LDL CALC: 127 mg/dL — AB (ref 0–99)
Triglycerides: 164 mg/dL — ABNORMAL HIGH (ref <150)
VLDL: 33 mg/dL (ref 0–40)

## 2014-04-14 ENCOUNTER — Encounter (INDEPENDENT_AMBULATORY_CARE_PROVIDER_SITE_OTHER): Payer: Self-pay

## 2014-04-14 ENCOUNTER — Ambulatory Visit (INDEPENDENT_AMBULATORY_CARE_PROVIDER_SITE_OTHER): Payer: BLUE CROSS/BLUE SHIELD | Admitting: Nurse Practitioner

## 2014-04-14 ENCOUNTER — Encounter: Payer: Self-pay | Admitting: Nurse Practitioner

## 2014-04-14 VITALS — BP 123/83 | HR 87 | Temp 97.8°F | Ht 67.0 in | Wt 221.2 lb

## 2014-04-14 DIAGNOSIS — E669 Obesity, unspecified: Secondary | ICD-10-CM

## 2014-04-14 DIAGNOSIS — Z7901 Long term (current) use of anticoagulants: Secondary | ICD-10-CM | POA: Diagnosis not present

## 2014-04-14 DIAGNOSIS — I10 Essential (primary) hypertension: Secondary | ICD-10-CM | POA: Diagnosis not present

## 2014-04-14 DIAGNOSIS — Z952 Presence of prosthetic heart valve: Secondary | ICD-10-CM

## 2014-04-14 DIAGNOSIS — E119 Type 2 diabetes mellitus without complications: Secondary | ICD-10-CM

## 2014-04-14 DIAGNOSIS — Z954 Presence of other heart-valve replacement: Secondary | ICD-10-CM | POA: Diagnosis not present

## 2014-04-14 LAB — POCT GLYCOSYLATED HEMOGLOBIN (HGB A1C): HEMOGLOBIN A1C: 7.5

## 2014-04-14 LAB — POCT UA - MICROALBUMIN: Microalbumin Ur, POC: NEGATIVE mg/L

## 2014-04-14 NOTE — Progress Notes (Signed)
Subjective:    Patient ID: Anna Salinas, female    DOB: November 21, 1970, 44 y.o.   MRN: [id number]    Patient here today to reestablish care- [facility] just recently had an aortic valve replacement- she had to go back because she had fluid gathering around her heart. She has been doing well the last 2 weeks. Denies any chest pain or SOB. SHe was placed on coumadin therapy and she has had no complications.    Hypertension This is a chronic problem. The current episode started more than 1 year ago. The problem is unchanged. The problem is controlled. Pertinent negatives include no anxiety, blurred vision, chest pain, headaches, palpitations, peripheral edema or shortness of breath. Risk factors for coronary artery disease include dyslipidemia, obesity and sedentary lifestyle. Past treatments include diuretics. The current treatment provides mild improvement. There is no history of CAD/MI or heart failure.  Diabetes She presents for her follow-up diabetic visit. She has type 2 diabetes mellitus. No MedicAlert identification noted. Her disease course has been stable. Pertinent negatives for hypoglycemia include no headaches. Pertinent negatives for diabetes include no blurred vision, no chest pain, no foot ulcerations, no polydipsia, no polyphagia and no polyuria. When asked about current treatments, none were reported. She has not had a previous visit with a dietitian. Home blood sugar record trend: does not check blood sugars at home. An ACE inhibitor/angiotensin II receptor blocker is not being taken. She does not see a podiatrist.Eye exam is not current.  hypokalemia On potassium supplements daily- no c/ o lower ext cramping.      Review of Systems  Constitutional: Negative.   HENT: Negative.   Eyes: Negative for blurred vision.  Respiratory: Negative for shortness of breath.   Cardiovascular: Negative for chest pain and palpitations.  Endocrine: Negative for polydipsia, polyphagia and polyuria.    Neurological: Negative for headaches.  Psychiatric/Behavioral: Negative.   All other systems reviewed and are negative.      Objective:   Physical Exam  Constitutional: She is oriented to person, place, and time. She appears well-developed and well-nourished.  HENT:  Nose: Nose normal.  Mouth/Throat: Oropharynx is clear and moist.  Eyes: EOM are normal.  Neck: Trachea normal, normal range of motion and full passive range of motion without pain. Neck supple. No JVD present. Carotid bruit is not present. No thyromegaly present.  Cardiovascular: Normal rate, regular rhythm, normal heart sounds and intact distal pulses.  Exam reveals no gallop and no friction rub.   No murmur heard. Pulmonary/Chest: Effort normal and breath sounds normal.  Abdominal: Soft. Bowel sounds are normal. She exhibits no distension and no mass. There is no tenderness.  Musculoskeletal: Normal range of motion.  Lymphadenopathy:    She has no cervical adenopathy.  Neurological: She is alert and oriented to person, place, and time. She has normal reflexes.  Skin: Skin is warm and dry.  Psychiatric: She has a normal mood and affect. Her behavior is normal. Judgment and thought content normal.    BP 123/83 mmHg  Pulse 87  Temp(Src) 97.8 F (36.6 C) (Oral)  Ht 5' 7" (1.702 m)  Wt 221 lb 3.2 oz (100.336 kg)  BMI 34.64 kg/m2  LMP 03/11/2014       Assessment & Plan:  1. Essential hypertension, benign Do not add salt to diet  2. Diabetes mellitus type II, non insulin dependent watch carbs  3. Obesity Discussed diet and exercise for person with BMI >25 Will recheck weight in 3-6 months  4. Aortic valve replaced Keep follow up with cardiologist  5. Long-term (current) use of anticoagulants Watch fo rbleeding  Orders Placed This Encounter  Procedures  . CMP14+EGFR  . NMR, lipoprofile  . POCT glycosylated hemoglobin (Hb A1C)  . POCT UA - Microalbumin     Labs pending Health maintenance  reviewed Diet and exercise encouraged Continue all meds Follow up  In 3 months   Mary-Margaret Martin, FNP    

## 2014-04-14 NOTE — Patient Instructions (Signed)

## 2014-04-15 ENCOUNTER — Encounter (HOSPITAL_COMMUNITY): Payer: BLUE CROSS/BLUE SHIELD

## 2014-04-15 ENCOUNTER — Ambulatory Visit (HOSPITAL_COMMUNITY): Payer: BLUE CROSS/BLUE SHIELD | Attending: Nurse Practitioner | Admitting: Cardiology

## 2014-04-15 DIAGNOSIS — I319 Disease of pericardium, unspecified: Secondary | ICD-10-CM | POA: Diagnosis present

## 2014-04-15 DIAGNOSIS — I3139 Other pericardial effusion (noninflammatory): Secondary | ICD-10-CM

## 2014-04-15 DIAGNOSIS — Z952 Presence of prosthetic heart valve: Secondary | ICD-10-CM

## 2014-04-15 DIAGNOSIS — E119 Type 2 diabetes mellitus without complications: Secondary | ICD-10-CM | POA: Insufficient documentation

## 2014-04-15 DIAGNOSIS — I313 Pericardial effusion (noninflammatory): Secondary | ICD-10-CM

## 2014-04-15 DIAGNOSIS — I10 Essential (primary) hypertension: Secondary | ICD-10-CM | POA: Diagnosis not present

## 2014-04-15 LAB — NMR, LIPOPROFILE
Cholesterol: 191 mg/dL (ref 100–199)
HDL CHOLESTEROL BY NMR: 25 mg/dL — AB (ref >39)
HDL Particle Number: 19.3 umol/L — ABNORMAL LOW (ref >=30.5)
LDL PARTICLE NUMBER: 2108 nmol/L — AB (ref <1000)
LDL Size: 20.9 nm (ref >20.5)
LDL-C: 126 mg/dL — ABNORMAL HIGH (ref 0–99)
LP-IR SCORE: 69 — AB (ref <=45)
SMALL LDL PARTICLE NUMBER: 1210 nmol/L — AB (ref <=527)
Triglycerides by NMR: 198 mg/dL — ABNORMAL HIGH (ref 0–149)

## 2014-04-15 LAB — CMP14+EGFR
ALBUMIN: 3.7 g/dL (ref 3.5–5.5)
ALT: 34 IU/L — AB (ref 0–32)
AST: 33 IU/L (ref 0–40)
Albumin/Globulin Ratio: 1.2 (ref 1.1–2.5)
Alkaline Phosphatase: 71 IU/L (ref 39–117)
BILIRUBIN TOTAL: 0.4 mg/dL (ref 0.0–1.2)
BUN/Creatinine Ratio: 14 (ref 9–23)
BUN: 9 mg/dL (ref 6–24)
CHLORIDE: 99 mmol/L (ref 97–108)
CO2: 24 mmol/L (ref 18–29)
Calcium: 9.2 mg/dL (ref 8.7–10.2)
Creatinine, Ser: 0.64 mg/dL (ref 0.57–1.00)
GFR calc non Af Amer: 111 mL/min/{1.73_m2} (ref >59)
GFR, EST AFRICAN AMERICAN: 128 mL/min/{1.73_m2} (ref >59)
Globulin, Total: 3.1 g/dL (ref 1.5–4.5)
Glucose: 190 mg/dL — ABNORMAL HIGH (ref 65–99)
POTASSIUM: 4.1 mmol/L (ref 3.5–5.2)
Sodium: 138 mmol/L (ref 134–144)
Total Protein: 6.8 g/dL (ref 6.0–8.5)

## 2014-04-15 NOTE — Progress Notes (Signed)
Limited echo performed. 

## 2014-04-18 ENCOUNTER — Other Ambulatory Visit: Payer: Self-pay | Admitting: Nurse Practitioner

## 2014-04-18 ENCOUNTER — Encounter (HOSPITAL_COMMUNITY): Payer: BLUE CROSS/BLUE SHIELD

## 2014-04-18 ENCOUNTER — Ambulatory Visit: Payer: BLUE CROSS/BLUE SHIELD | Admitting: Pharmacist Clinician (PhC)/ Clinical Pharmacy Specialist

## 2014-04-18 MED ORDER — ATORVASTATIN CALCIUM 40 MG PO TABS: 40.0000 mg | ORAL_TABLET | Freq: Every day | ORAL | Status: DC

## 2014-04-19 ENCOUNTER — Other Ambulatory Visit: Payer: Self-pay | Admitting: Surgery

## 2014-04-19 DIAGNOSIS — Z952 Presence of prosthetic heart valve: Secondary | ICD-10-CM

## 2014-04-20 ENCOUNTER — Ambulatory Visit (INDEPENDENT_AMBULATORY_CARE_PROVIDER_SITE_OTHER): Payer: BLUE CROSS/BLUE SHIELD | Admitting: Pharmacist Clinician (PhC)/ Clinical Pharmacy Specialist

## 2014-04-20 ENCOUNTER — Encounter (HOSPITAL_COMMUNITY): Payer: BLUE CROSS/BLUE SHIELD

## 2014-04-20 ENCOUNTER — Encounter: Payer: Self-pay | Admitting: Surgery

## 2014-04-20 ENCOUNTER — Ambulatory Visit (INDEPENDENT_AMBULATORY_CARE_PROVIDER_SITE_OTHER): Payer: Self-pay | Admitting: Surgery

## 2014-04-20 ENCOUNTER — Ambulatory Visit
Admission: RE | Admit: 2014-04-20 | Discharge: 2014-04-20 | Disposition: A | Discharge status: Home / Self Care | Payer: BLUE CROSS/BLUE SHIELD | Source: Ambulatory Visit | Attending: Surgery | Admitting: Surgery

## 2014-04-20 VITALS — BP 121/80 | HR 72 | Resp 20 | Ht 67.0 in | Wt 220.0 lb

## 2014-04-20 DIAGNOSIS — Z7901 Long term (current) use of anticoagulants: Secondary | ICD-10-CM | POA: Diagnosis not present

## 2014-04-20 DIAGNOSIS — Z952 Presence of prosthetic heart valve: Secondary | ICD-10-CM

## 2014-04-20 DIAGNOSIS — Z954 Presence of other heart-valve replacement: Secondary | ICD-10-CM

## 2014-04-20 DIAGNOSIS — I350 Nonrheumatic aortic (valve) stenosis: Secondary | ICD-10-CM

## 2014-04-20 DIAGNOSIS — J948 Other specified pleural conditions: Secondary | ICD-10-CM

## 2014-04-20 DIAGNOSIS — J90 Pleural effusion, not elsewhere classified: Secondary | ICD-10-CM

## 2014-04-20 LAB — POCT INR: INR: 2.6

## 2014-04-22 ENCOUNTER — Encounter (HOSPITAL_COMMUNITY): Payer: BLUE CROSS/BLUE SHIELD

## 2014-04-23 ENCOUNTER — Encounter: Payer: Self-pay | Admitting: Surgery

## 2014-04-23 NOTE — Progress Notes (Signed)
      HPI:  The patient returns for follow up after undergoing AVR with a 21 mm St. Jude mechanical valve on 02/14/2014 and then subxyphoid pericardial window and placement of a right chest tube on 03/27/2014 for a large pericardial effusion and right pleural effusion. She has done well since and is at home walking without chest pain or shortness of breath.  Current Outpatient Prescriptions  Medication Sig Dispense Refill  . acetaminophen (TYLENOL) 325 MG tablet Take 650 mg by mouth every 6 (six) hours as needed (pain).    Marland Kitchen atorvastatin (LIPITOR) 40 MG tablet Take 1 tablet (40 mg total) by mouth daily. 90 tablet 1  . colchicine 0.6 MG tablet Take 1 tablet (0.6 mg total) by mouth 2 (two) times daily. 60 tablet 1  . furosemide (LASIX) 20 MG tablet Take 1 to 2 tablets by mouth daily as directed 45 tablet 3  . Potassium Chloride ER 20 MEQ TBCR Take 20 mEq by mouth daily. 30 tablet 3  . warfarin (COUMADIN) 5 MG tablet Take 1 tablet (5 mg total) by mouth daily. 03/17/14:  Pt states she takes Coumadin 5mg  on Monday then 2.5mg  all other days of the week. 40 tablet 1   No current facility-administered medications for this visit.     Physical Exam: BP 121/80 mmHg  Pulse 72  Resp 20  Ht 5\' 7"  (1.702 m)  Wt 220 lb (99.791 kg)  BMI 34.45 kg/m2  SpO2 99%  LMP 04/20/2014 She looks well. Lung exam is clear. Cardiac exam shows a regular rate and rhythm with mechanical heart sounds. Chest and epigastric incisions are healing well and sternum is stable.  Impression:  Overall she is doing very well following her surgery. Her INR is therapeutic at 2.6 on 04/20/2014. She will need close follow up of her diabetes. Her Hgb A1c was 7.5 preop on 04/14/2014.  Plan:  She will continue follow up with cardiology and her PCP. She will return to see me if she develops any problems with her incisions.   Gaye Pollack, MD [facility] [phone]

## 2014-04-25 ENCOUNTER — Encounter (HOSPITAL_COMMUNITY): Payer: BLUE CROSS/BLUE SHIELD

## 2014-04-26 ENCOUNTER — Telehealth: Payer: Self-pay

## 2014-04-26 NOTE — Telephone Encounter (Signed)
Received a call from patient she stated PCP prescribed lipitor and she wanted to make sure ok with Dr.Jordan to take.Dr.Jordan advised ok to take.

## 2014-04-27 ENCOUNTER — Encounter (HOSPITAL_COMMUNITY): Payer: BLUE CROSS/BLUE SHIELD

## 2014-04-29 ENCOUNTER — Encounter (HOSPITAL_COMMUNITY): Payer: BLUE CROSS/BLUE SHIELD

## 2014-05-02 ENCOUNTER — Encounter (HOSPITAL_COMMUNITY): Payer: BLUE CROSS/BLUE SHIELD

## 2014-05-04 ENCOUNTER — Encounter (HOSPITAL_COMMUNITY): Payer: BLUE CROSS/BLUE SHIELD

## 2014-05-04 ENCOUNTER — Telehealth: Payer: Self-pay | Admitting: Pharmacist Clinician (PhC)/ Clinical Pharmacy Specialist

## 2014-05-04 NOTE — Telephone Encounter (Signed)
°  1. Which medications need to be refilled? Coumadin  2. Which pharmacy is medication to be sent to?[facility] in [place]   3. Do they need a 30 day or 90 day supply? 30  4. Would they like a call back once the medication has been sent to the pharmacy? Yes

## 2014-05-05 ENCOUNTER — Other Ambulatory Visit: Payer: Self-pay | Admitting: Pharmacist Clinician (PhC)/ Clinical Pharmacy Specialist

## 2014-05-05 MED ORDER — WARFARIN SODIUM 5 MG PO TABS: ORAL_TABLET | ORAL | Status: DC

## 2014-05-05 NOTE — Telephone Encounter (Signed)
LMOM filled x 1, pt will need to get next refill from MD monitoring INR

## 2014-05-06 ENCOUNTER — Encounter (HOSPITAL_COMMUNITY): Payer: BLUE CROSS/BLUE SHIELD

## 2014-05-09 ENCOUNTER — Encounter (HOSPITAL_COMMUNITY): Payer: BLUE CROSS/BLUE SHIELD

## 2014-05-09 NOTE — Progress Notes (Signed)
04/13/2014 Patient is discharged from [facility] [place] program today, March 28, 2014 with completing only one session. Patient had health issues then decided to go back to work.

## 2014-05-09 NOTE — Addendum Note (Signed)
Encounter addended by: Cathie Olden, RN on: 05/09/2014 11:53 AM<BR>     Documentation filed: Notes Section

## 2014-05-11 ENCOUNTER — Encounter (HOSPITAL_COMMUNITY): Payer: BLUE CROSS/BLUE SHIELD

## 2014-05-13 ENCOUNTER — Encounter (HOSPITAL_COMMUNITY): Payer: BLUE CROSS/BLUE SHIELD

## 2014-05-16 ENCOUNTER — Encounter (HOSPITAL_COMMUNITY): Payer: BLUE CROSS/BLUE SHIELD

## 2014-05-18 ENCOUNTER — Encounter (HOSPITAL_COMMUNITY): Payer: BLUE CROSS/BLUE SHIELD

## 2014-05-20 ENCOUNTER — Encounter (HOSPITAL_COMMUNITY): Payer: BLUE CROSS/BLUE SHIELD

## 2014-05-23 ENCOUNTER — Encounter (HOSPITAL_COMMUNITY): Payer: BLUE CROSS/BLUE SHIELD

## 2014-05-25 ENCOUNTER — Encounter (HOSPITAL_COMMUNITY): Payer: BLUE CROSS/BLUE SHIELD

## 2014-05-27 ENCOUNTER — Encounter (HOSPITAL_COMMUNITY): Payer: BLUE CROSS/BLUE SHIELD

## 2014-05-30 ENCOUNTER — Ambulatory Visit (INDEPENDENT_AMBULATORY_CARE_PROVIDER_SITE_OTHER): Payer: BLUE CROSS/BLUE SHIELD | Admitting: Pharmacist

## 2014-05-30 ENCOUNTER — Encounter (HOSPITAL_COMMUNITY): Payer: BLUE CROSS/BLUE SHIELD

## 2014-05-30 DIAGNOSIS — Z952 Presence of prosthetic heart valve: Secondary | ICD-10-CM

## 2014-05-30 DIAGNOSIS — Z954 Presence of other heart-valve replacement: Secondary | ICD-10-CM

## 2014-05-30 LAB — POCT INR: INR: 1.8

## 2014-05-30 MED ORDER — WARFARIN SODIUM 5 MG PO TABS: ORAL_TABLET | ORAL | Status: DC

## 2014-05-30 NOTE — Patient Instructions (Signed)
Anticoagulation Dose Instructions as of 05/30/2014      Dorene Grebe Tue Wed Thu Fri Sat   New Dose 5 mg 2.5 mg 5 mg 2.5 mg 5 mg 2.5 mg 5 mg    Description        Take 2 tablets today, then increase dose to 1/2 tablet mondays and fridays and 1 tablet all other days.      INR was 1.8 today

## 2014-06-01 ENCOUNTER — Encounter (HOSPITAL_COMMUNITY): Payer: BLUE CROSS/BLUE SHIELD

## 2014-06-03 ENCOUNTER — Encounter (HOSPITAL_COMMUNITY): Payer: BLUE CROSS/BLUE SHIELD

## 2014-06-06 ENCOUNTER — Encounter (HOSPITAL_COMMUNITY): Payer: BLUE CROSS/BLUE SHIELD

## 2014-06-08 ENCOUNTER — Encounter (HOSPITAL_COMMUNITY): Payer: BLUE CROSS/BLUE SHIELD

## 2014-06-09 ENCOUNTER — Ambulatory Visit (INDEPENDENT_AMBULATORY_CARE_PROVIDER_SITE_OTHER): Payer: BLUE CROSS/BLUE SHIELD | Admitting: Cardiology

## 2014-06-09 ENCOUNTER — Encounter: Payer: Self-pay | Admitting: Cardiology

## 2014-06-09 VITALS — BP 132/77 | HR 71 | Ht 68.0 in | Wt 225.2 lb

## 2014-06-09 DIAGNOSIS — Z954 Presence of other heart-valve replacement: Secondary | ICD-10-CM | POA: Diagnosis not present

## 2014-06-09 DIAGNOSIS — I970 Postcardiotomy syndrome: Secondary | ICD-10-CM

## 2014-06-09 DIAGNOSIS — Z7901 Long term (current) use of anticoagulants: Secondary | ICD-10-CM

## 2014-06-09 DIAGNOSIS — Z952 Presence of prosthetic heart valve: Secondary | ICD-10-CM | POA: Insufficient documentation

## 2014-06-09 NOTE — Progress Notes (Signed)
CARDIOLOGY OFFICE NOTE  Date:  06/09/2014    [name] Date of Birth: 10-04-1970 Medical Record #[id number]  PCP:  Chevis Pretty, FNP  Cardiologist:  Martin[place]    Chief Complaint  Patient presents with  . Follow-up    soreness on right side,some sweling of ankles bilateral     History of Present Illness: CAROLEANN CASLER is a 44 y.o. female who presents for follow up AVR.  She has a history of AVR in 02/2014. She has been on coumadin.  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 These were drained. Repeat CXR and Echo showed resolution of effusions.   Comes back today. She is doing very well. Denies SOB. Still has some discomfort in right upper chest and shoulder. She has returned to work. Noted some swelling one day after she had been on her feet all day at work. No palpitations. She does complain of heavier menses on coumadin with more prolonged bleeding.   Past Medical History  Diagnosis Date  . Essential hypertension, benign   . Palpitations   . Aortic stenosis due to bicuspid aortic valve   . Type 2 diabetes mellitus     Past Surgical History  Procedure Laterality Date  . Cesarean section  2009  . Tubal ligation    . Left and right heart catheterization with coronary angiogram N/A 01/25/2014    Procedure: LEFT AND RIGHT HEART CATHETERIZATION WITH CORONARY ANGIOGRAM;  Surgeon: Peter M Martinique, MD;  Location: [facility] CATH LAB;  Service: Cardiovascular;  Laterality: N/A;  . Cardiac catheterization    . Aortic valve replacement N/A 02/14/2014    Procedure: AORTIC VALVE REPLACEMENT (AVR);  Surgeon: Gaye Pollack, MD;  Location: [place];  Service: Open Heart Surgery;  Laterality: N/A;  . Intraoperative transesophageal echocardiogram N/A 02/14/2014    Procedure: INTRAOPERATIVE TRANSESOPHAGEAL ECHOCARDIOGRAM;  Surgeon: Gaye Pollack, MD;  Location: [facility] OR;  Service: Open Heart Surgery;  Laterality: N/A;  . Subxyphoid  pericardial window N/A 03/27/2014    Procedure: SUBXYPHOID PERICARDIAL WINDOW;  Surgeon: Grace Isaac, MD;  Location: [place];  Service: Thoracic;  Laterality: N/A;  . Chest tube insertion Right 03/27/2014    Procedure: CHEST TUBE INSERTION;  Surgeon: Grace Isaac, MD;  Location: [place];  Service: Thoracic;  Laterality: Right;  . Pleural effusion drainage Right 03/27/2014    Procedure: DRAINAGE OF PLEURAL EFFUSION;  Surgeon: Grace Isaac, MD;  Location: [place];  Service: Thoracic;  Laterality: Right;  Drainage of right pleural effusion  . Pericardial fluid drainage N/A 03/27/2014    Procedure: DRAINAGE OF PERICARDIAL FLUID;  Surgeon: Grace Isaac, MD;  Location: [facility] OR;  Service: Thoracic;  Laterality: N/A;     Medications: Current Outpatient Prescriptions  Medication Sig Dispense Refill  . acetaminophen (TYLENOL) 325 MG tablet Take 650 mg by mouth every 6 (six) hours as needed (pain).    . furosemide (LASIX) 20 MG tablet Take 1 to 2 tablets by mouth daily as directed 45 tablet 3  . Potassium Chloride ER 20 MEQ TBCR Take 20 mEq by mouth daily. 30 tablet 3  . warfarin (COUMADIN) 5 MG tablet Take 1 tablet by mouth daily or as directed by coumadin clinic 90 tablet 1   No current facility-administered medications for this visit.    Allergies: Allergies  Allergen Reactions  . Glipizide Nausea And Vomiting and Other (See Comments)    GI upset  . Metoprolol Other (  See Comments)    "Made me sick"  . Shellfish Allergy Swelling    Throat and eyes were swollen.  Had difficulty breathing.  Was hospitalized.      Social History: The patient  reports that she has never smoked. She has never used smokeless tobacco. She reports that she does not drink alcohol or use illicit drugs.   Family History: The patient's family history includes Brain cancer in her brother; Cancer in her maternal grandmother; Diabetes in her mother.   Review of Systems: Please see the history of present  illness.   Otherwise, the review of systems is positive for diarrhea.   All other systems are reviewed and negative.   Physical Exam: VS:  BP 132/77 mmHg  Pulse 71  Ht 5\' 8"  (1.727 m)  Wt 102.15 kg (225 lb 3.2 oz)  BMI 34.25 kg/m2 .  BMI Body mass index is 34.25 kg/(m^2).  Wt Readings from Last 3 Encounters:  06/09/14 102.15 kg (225 lb 3.2 oz)  04/20/14 99.791 kg (220 lb)  04/14/14 100.336 kg (221 lb 3.2 oz)    General: Pleasant. Well developed, well nourished and in no acute distress.  HEENT: Normal. Neck: Supple, no JVD, carotid bruits, or masses noted.  Cardiac: Regular rate and rhythm. No murmurs, rubs, or gallops. Her valve is crisp. Sternum and lower chest looks ok. No edema.  Respiratory:  Lungs are clear to auscultation bilaterally with normal work of breathing.  GI: Soft and nontender.  MS: No deformity or atrophy. Gait and ROM intact. Skin: Warm and dry. Color is normal.  Neuro:  Strength and sensation are intact and no gross focal deficits noted.  Psych: Alert, appropriate and with normal affect.   LABORATORY DATA:  EKG:  EKG is ordered today. This demonstrates NSR. Normal Ecg. I have personally reviewed and interpreted this study.   Lab Results  Component Value Date   WBC 6.0 04/01/2014   HGB 10.6* 04/01/2014   HCT 34.2* 04/01/2014   PLT 189 04/01/2014   GLUCOSE 190* 04/14/2014   CHOL 191 04/14/2014   TRIG 198* 04/14/2014   HDL 25* 04/14/2014   LDLCALC 127* 04/12/2014   ALT 34* 04/14/2014   AST 33 04/14/2014   NA 138 04/14/2014   K 4.1 04/14/2014   CL 99 04/14/2014   CREATININE 0.64 04/14/2014   BUN 9 04/14/2014   CO2 24 04/14/2014   INR 1.8 05/30/2014   HGBA1C 7.5 04/14/2014    BNP (last 3 results)  Recent Labs  03/26/14 1050  BNP 272.0*    ProBNP (last 3 results) No results for input(s): PROBNP in the last 8760 hours.   Other Studies Reviewed Today:   Echo: 03/26/2014 Study Conclusions - Left ventricle: The cavity size was normal.  Wall thickness was normal. Systolic function was vigorous. The estimated ejection fraction was in the range of 65% to 70%. Wall motion was normal; there were no regional wall motion abnormalities. - Aortic valve: A [employer] mechanical prosthesis was present. No obvious perivalvular leak based on limited views. There was no significant regurgitation. Mean gradient (S): 15 mm Hg. Peak gradient (S): 28 mm Hg. - Right ventricle: The cavity size was below normal. - Tricuspid valve: There was trivial regurgitation. - Pulmonary arteries: Systolic pressure could not be accurately estimated. - Pericardium, extracardiac: A large pericardial effusion was identified circumferential to the heart, measures approximately 4 cm posteriorly and 3 cm anteriorly. There is compression of the right ventricle noted in the subcostal  view and further evidence of tamponade physiology based on respiratory variation in mitral inflow. There was a right pleural effusion. Impressions: - Normal LV wall thickness with LVEF 65-70%. [employer]. Jude mechanical AVR without obvious perivalvular leak based on limited and with grossly normal gradients. A large pericardial effusion was identified circumferential to the heart, measures approximately 4 cm posteriorly and 3-4 cm anteriorly. There is compression of the right ventricle noted in the subcostal view and further evidence of tamponade physiology based on respiratory variation in mitral inflow. Right pleural effusion also noted. Results discussed with Dr. Servando Snare.  Assessment/Plan: 1. Post pericardectomy syndrome- s/p window and chest tube placement - now resolved.   2. S/p AVR. Needs long term coumadin. Reviewed recommendations for SBE prophylaxis.   3. DM- dietary therapy  4. Chronic coumadin therapy  Current medicines are reviewed with the patient today.  The patient does not have concerns regarding medicines other  than what has been noted above.  The following changes have been made:  See above.  Labs/ tests ordered today include:    Orders Placed This Encounter  Procedures  . EKG 12-Lead     Disposition:   FU with me in 6 months. Patient is instructed to follow up with OB/GYN concerning her heavy menses.   Signed: Peter Martinique MD, [facility]    06/09/2014 12:39 PM

## 2014-06-09 NOTE — Patient Instructions (Signed)
You may take lasix and potassium as needed.  Continue coumadin  I will see you in 6 months.

## 2014-06-10 ENCOUNTER — Encounter (HOSPITAL_COMMUNITY): Payer: BLUE CROSS/BLUE SHIELD

## 2014-06-16 ENCOUNTER — Ambulatory Visit (INDEPENDENT_AMBULATORY_CARE_PROVIDER_SITE_OTHER): Payer: BLUE CROSS/BLUE SHIELD | Admitting: Pharmacist

## 2014-06-16 ENCOUNTER — Encounter (INDEPENDENT_AMBULATORY_CARE_PROVIDER_SITE_OTHER): Payer: Self-pay

## 2014-06-16 DIAGNOSIS — Z954 Presence of other heart-valve replacement: Secondary | ICD-10-CM

## 2014-06-16 DIAGNOSIS — Z952 Presence of prosthetic heart valve: Secondary | ICD-10-CM

## 2014-06-16 LAB — POCT INR: INR: 3.3

## 2014-06-16 NOTE — Patient Instructions (Signed)
Anticoagulation Dose Instructions as of 06/16/2014      Anna Salinas Tue Wed Thu Fri Sat   New Dose 5 mg 2.5 mg 5 mg 2.5 mg 5 mg 2.5 mg 5 mg    Description        Continue dose to 1/2 tablet mondays and fridays and 1 tablet all other days. Goal INR is 2.5 to 3.5     INR was 3.3 today

## 2014-07-06 ENCOUNTER — Ambulatory Visit: Payer: Self-pay | Admitting: Nurse Practitioner

## 2014-07-07 ENCOUNTER — Ambulatory Visit: Payer: Self-pay | Admitting: Nurse Practitioner

## 2014-07-11 ENCOUNTER — Encounter (INDEPENDENT_AMBULATORY_CARE_PROVIDER_SITE_OTHER): Payer: Self-pay

## 2014-07-11 ENCOUNTER — Ambulatory Visit (INDEPENDENT_AMBULATORY_CARE_PROVIDER_SITE_OTHER): Payer: BLUE CROSS/BLUE SHIELD | Admitting: Nurse Practitioner

## 2014-07-11 ENCOUNTER — Encounter: Payer: Self-pay | Admitting: Nurse Practitioner

## 2014-07-11 VITALS — BP 129/87 | HR 82 | Temp 99.0°F | Ht 68.0 in | Wt 231.4 lb

## 2014-07-11 DIAGNOSIS — E876 Hypokalemia: Secondary | ICD-10-CM

## 2014-07-11 DIAGNOSIS — Z954 Presence of other heart-valve replacement: Secondary | ICD-10-CM

## 2014-07-11 DIAGNOSIS — I10 Essential (primary) hypertension: Secondary | ICD-10-CM | POA: Diagnosis not present

## 2014-07-11 DIAGNOSIS — N921 Excessive and frequent menstruation with irregular cycle: Secondary | ICD-10-CM | POA: Diagnosis not present

## 2014-07-11 DIAGNOSIS — Z7901 Long term (current) use of anticoagulants: Secondary | ICD-10-CM

## 2014-07-11 DIAGNOSIS — E669 Obesity, unspecified: Secondary | ICD-10-CM

## 2014-07-11 DIAGNOSIS — Z6835 Body mass index (BMI) 35.0-35.9, adult: Secondary | ICD-10-CM | POA: Insufficient documentation

## 2014-07-11 DIAGNOSIS — E119 Type 2 diabetes mellitus without complications: Secondary | ICD-10-CM

## 2014-07-11 DIAGNOSIS — Z952 Presence of prosthetic heart valve: Secondary | ICD-10-CM

## 2014-07-11 LAB — POCT GLYCOSYLATED HEMOGLOBIN (HGB A1C): Hemoglobin A1C: 9.1

## 2014-07-11 LAB — POCT INR: INR: 1.5

## 2014-07-11 MED ORDER — METFORMIN HCL 500 MG PO TABS: 500.0000 mg | ORAL_TABLET | Freq: Two times a day (BID) | ORAL | Status: DC

## 2014-07-11 MED ORDER — MEDROXYPROGESTERONE ACETATE 10 MG PO TABS: 10.0000 mg | ORAL_TABLET | Freq: Every day | ORAL | Status: DC

## 2014-07-11 NOTE — Progress Notes (Signed)
Subjective:    Patient ID: Anna Salinas, female    DOB: [birth year], 44 y.o.   MRN: [id number]    Patient here today for follow up of chronic medical problems. Menses has lasted several weeks- went through a tampon in 1 hour today. She is on coumadin.   Hypertension This is a chronic problem. The current episode started more than 1 year ago. The problem is unchanged. The problem is controlled. Pertinent negatives include no anxiety, blurred vision, chest pain, headaches, palpitations, peripheral edema or shortness of breath. Risk factors for coronary artery disease include dyslipidemia, obesity and sedentary lifestyle. Past treatments include diuretics. The current treatment provides mild improvement. There is no history of CAD/MI or heart failure.  Diabetes She presents for her follow-up diabetic visit. She has type 2 diabetes mellitus. No MedicAlert identification noted. Her disease course has been stable. Pertinent negatives for hypoglycemia include no headaches. Pertinent negatives for diabetes include no blurred vision, no chest pain, no foot ulcerations, no polydipsia, no polyphagia and no polyuria. When asked about current treatments, none were reported. She has not had a previous visit with a dietitian. Home blood sugar record trend: does not check blood sugars at home. An ACE inhibitor/angiotensin II receptor blocker is not being taken. She does not see a podiatrist.Eye exam is not current.  hypokalemia No longer on potassium supplements daily- cardiologist said to only take as needed- no c/ o lower ext cramping. AVR- long term coumadin treatment See anticoag note     Review of Systems  Constitutional: Negative.   HENT: Negative.   Eyes: Negative for blurred vision.  Respiratory: Negative for shortness of breath.   Cardiovascular: Negative for chest pain and palpitations.  Endocrine: Negative for polydipsia, polyphagia and polyuria.  Neurological: Negative for headaches.    Psychiatric/Behavioral: Negative.   All other systems reviewed and are negative.      Objective:   Physical Exam  Constitutional: She is oriented to person, place, and time. She appears well-developed and well-nourished.  HENT:  Nose: Nose normal.  Mouth/Throat: Oropharynx is clear and moist.  Eyes: EOM are normal.  Neck: Trachea normal, normal range of motion and full passive range of motion without pain. Neck supple. No JVD present. Carotid bruit is not present. No thyromegaly present.  Cardiovascular: Normal rate, regular rhythm, normal heart sounds and intact distal pulses.  Exam reveals no gallop and no friction rub.   No murmur heard. Click of aortic valve audible  Pulmonary/Chest: Effort normal and breath sounds normal.  Abdominal: Soft. Bowel sounds are normal. She exhibits no distension and no mass. There is no tenderness.  Musculoskeletal: Normal range of motion.  Lymphadenopathy:    She has no cervical adenopathy.  Neurological: She is alert and oriented to person, place, and time. She has normal reflexes.  Skin: Skin is warm and dry.  Psychiatric: She has a normal mood and affect. Her behavior is normal. Judgment and thought content normal.    BP 129/87 mmHg  Pulse 82  Temp(Src) 99 F (37.2 C) (Oral)  Ht 5\' 8"  (1.727 m)  Wt 231 lb 6.4 oz (104.962 kg)  BMI 35.19 kg/m2  Results for orders placed or performed in visit on 07/11/14  POCT glycosylated hemoglobin (Hb A1C)  Result Value Ref Range   Hemoglobin A1C 9.1   POCT INR  Result Value Ref Range   INR 1.5         Assessment & Plan:  1. Essential hypertension, benign Do not  add salt to diet - CMP14+EGFR  2. Diabetes mellitus type II, non insulin dependent Continue to count carbs Added metformin500mg  1 po BID #180- 1 refill Keep diary of blood sugars - POCT glycosylated hemoglobin (Hb A1C) - Lipid panel  3. Aortic valve replaced - POCT INR  4. Long-term (current) use of anticoagulants - POCT  INR  5. Obesity  6. BMI 35.0-35.9,adult Discussed diet and exercise for person with BMI >25 Will recheck weight in 3-6 months   7. Hypokalemia If start having lower ext cramping- RTO  8. Menorrhagia with irregular cycle Bleeding may get heavy - medroxyPROGESTERone (PROVERA) 10 MG tablet; Take 1 tablet (10 mg total) by mouth daily.  Dispense: 10 tablet; Refill: 0    Labs pending Health maintenance reviewed Diet and exercise encouraged Continue all meds Follow up  In 3 month   Chandler, FNP

## 2014-07-11 NOTE — Patient Instructions (Signed)
Anticoagulation Dose Instructions as of 07/11/2014      Anna Salinas Tue Wed Thu Fri Sat   New Dose 5 mg 5 mg 5 mg 5 mg 5 mg 5 mg 5 mg    Description        Change and take 1 tablet daily and recheck monday     Recheck on Tuesday July5, 2016

## 2014-07-12 LAB — CMP14+EGFR
ALT: 68 IU/L — ABNORMAL HIGH (ref 0–32)
AST: 77 IU/L — ABNORMAL HIGH (ref 0–40)
Albumin/Globulin Ratio: 1.3 (ref 1.1–2.5)
Albumin: 3.8 g/dL (ref 3.5–5.5)
Alkaline Phosphatase: 96 IU/L (ref 39–117)
BUN / CREAT RATIO: 15 (ref 9–23)
BUN: 11 mg/dL (ref 6–24)
Bilirubin Total: 0.3 mg/dL (ref 0.0–1.2)
CALCIUM: 9.3 mg/dL (ref 8.7–10.2)
CO2: 21 mmol/L (ref 18–29)
Chloride: 95 mmol/L — ABNORMAL LOW (ref 97–108)
Creatinine, Ser: 0.71 mg/dL (ref 0.57–1.00)
GFR calc Af Amer: 121 mL/min/{1.73_m2} (ref >59)
GFR calc non Af Amer: 105 mL/min/{1.73_m2} (ref >59)
GLOBULIN, TOTAL: 2.9 g/dL (ref 1.5–4.5)
GLUCOSE: 356 mg/dL — AB (ref 65–99)
POTASSIUM: 4.1 mmol/L (ref 3.5–5.2)
SODIUM: 135 mmol/L (ref 134–144)
Total Protein: 6.7 g/dL (ref 6.0–8.5)

## 2014-07-12 LAB — LIPID PANEL
CHOL/HDL RATIO: 7.9 ratio — AB (ref 0.0–4.4)
Cholesterol, Total: 174 mg/dL (ref 100–199)
HDL: 22 mg/dL — AB (ref >39)
LDL CALC: 100 mg/dL — AB (ref 0–99)
Triglycerides: 260 mg/dL — ABNORMAL HIGH (ref 0–149)
VLDL CHOLESTEROL CAL: 52 mg/dL — AB (ref 5–40)

## 2014-07-12 NOTE — Progress Notes (Signed)
Patient aware. Says she will do as directed.

## 2014-07-22 ENCOUNTER — Telehealth: Payer: Self-pay

## 2014-07-22 NOTE — Telephone Encounter (Signed)
Received call from patient 07/12/14 she stated she has been having heavy menstrual periods.INR 07/12/14 1.7.Dr.Jordan advised needs to continue coumadin.Keep appointment with GYN 07/20/14.

## 2014-08-09 ENCOUNTER — Telehealth: Payer: Self-pay | Admitting: Cardiology

## 2014-08-09 NOTE — Telephone Encounter (Signed)
Spoke with patient who called to ask if she will need to stop Coumadin for an upcoming procedure that will numb her uterus (sounds like an uterine ablation).  I advised her that she will need to contact the surgeon's office to ask them to send [place] request for clearance to hold Coumadin for desired amount of time.  Patient verbalized understanding and agreement.

## 2014-08-09 NOTE — Telephone Encounter (Signed)
Pt is going to have surgery,wants to know what to do about her Coumadin please.

## 2014-08-22 ENCOUNTER — Ambulatory Visit (INDEPENDENT_AMBULATORY_CARE_PROVIDER_SITE_OTHER): Payer: BLUE CROSS/BLUE SHIELD | Admitting: Pharmacist

## 2014-08-22 DIAGNOSIS — Z954 Presence of other heart-valve replacement: Secondary | ICD-10-CM | POA: Diagnosis not present

## 2014-08-22 DIAGNOSIS — Z7901 Long term (current) use of anticoagulants: Secondary | ICD-10-CM | POA: Diagnosis not present

## 2014-08-22 DIAGNOSIS — D649 Anemia, unspecified: Secondary | ICD-10-CM | POA: Diagnosis not present

## 2014-08-22 DIAGNOSIS — Z952 Presence of prosthetic heart valve: Secondary | ICD-10-CM

## 2014-08-22 LAB — POCT INR: INR: 2.2

## 2014-08-22 NOTE — Patient Instructions (Signed)
Anticoagulation Dose Instructions as of 08/22/2014      Dorene Grebe Tue Wed Thu Fri Sat   New Dose 5 mg 5 mg 5 mg 5 mg 5 mg 5 mg 5 mg    Description        Continue warfarin 5mg  take 1 tablet daily.     INR was 2.2 today

## 2014-08-23 LAB — CBC WITH DIFFERENTIAL/PLATELET
BASOS ABS: 0 10*3/uL (ref 0.0–0.2)
Basos: 1 %
EOS (ABSOLUTE): 0.1 10*3/uL (ref 0.0–0.4)
Eos: 1 %
HEMATOCRIT: 27.3 % — AB (ref 34.0–46.6)
HEMOGLOBIN: 7.7 g/dL — AB (ref 11.1–15.9)
IMMATURE GRANULOCYTES: 0 %
Immature Grans (Abs): 0 10*3/uL (ref 0.0–0.1)
LYMPHS: 25 %
Lymphocytes Absolute: 1.5 10*3/uL (ref 0.7–3.1)
MCH: 21.8 pg — ABNORMAL LOW (ref 26.6–33.0)
MCHC: 28.2 g/dL — AB (ref 31.5–35.7)
MCV: 77 fL — ABNORMAL LOW (ref 79–97)
MONOCYTES: 8 %
Monocytes Absolute: 0.5 10*3/uL (ref 0.1–0.9)
Neutrophils Absolute: 4 10*3/uL (ref 1.4–7.0)
Neutrophils: 65 %
Platelets: 163 10*3/uL (ref 150–379)
RBC: 3.53 x10E6/uL — ABNORMAL LOW (ref 3.77–5.28)
RDW: 19.4 % — ABNORMAL HIGH (ref 12.3–15.4)
WBC: 6 10*3/uL (ref 3.4–10.8)

## 2014-08-24 ENCOUNTER — Other Ambulatory Visit: Payer: Self-pay | Admitting: Pharmacist

## 2014-08-24 DIAGNOSIS — D649 Anemia, unspecified: Secondary | ICD-10-CM

## 2014-08-25 ENCOUNTER — Other Ambulatory Visit (INDEPENDENT_AMBULATORY_CARE_PROVIDER_SITE_OTHER): Payer: BLUE CROSS/BLUE SHIELD

## 2014-08-25 DIAGNOSIS — D649 Anemia, unspecified: Secondary | ICD-10-CM | POA: Diagnosis not present

## 2014-08-26 ENCOUNTER — Other Ambulatory Visit: Payer: Self-pay | Admitting: Pharmacist

## 2014-08-26 DIAGNOSIS — D649 Anemia, unspecified: Secondary | ICD-10-CM

## 2014-08-26 LAB — CBC WITH DIFFERENTIAL/PLATELET
BASOS: 1 %
Basophils Absolute: 0 10*3/uL (ref 0.0–0.2)
EOS (ABSOLUTE): 0.1 10*3/uL (ref 0.0–0.4)
Eos: 1 %
HEMOGLOBIN: 7.6 g/dL — AB (ref 11.1–15.9)
Hematocrit: 27.2 % — ABNORMAL LOW (ref 34.0–46.6)
IMMATURE GRANS (ABS): 0 10*3/uL (ref 0.0–0.1)
Immature Granulocytes: 0 %
LYMPHS ABS: 1.4 10*3/uL (ref 0.7–3.1)
Lymphs: 24 %
MCH: 21 pg — AB (ref 26.6–33.0)
MCHC: 27.9 g/dL — AB (ref 31.5–35.7)
MCV: 75 fL — AB (ref 79–97)
MONOS ABS: 0.3 10*3/uL (ref 0.1–0.9)
Monocytes: 6 %
Neutrophils Absolute: 4.2 10*3/uL (ref 1.4–7.0)
Neutrophils: 68 %
Platelets: 155 10*3/uL (ref 150–379)
RBC: 3.62 x10E6/uL — ABNORMAL LOW (ref 3.77–5.28)
RDW: 18.9 % — ABNORMAL HIGH (ref 12.3–15.4)
WBC: 6 10*3/uL (ref 3.4–10.8)

## 2014-09-01 ENCOUNTER — Other Ambulatory Visit (INDEPENDENT_AMBULATORY_CARE_PROVIDER_SITE_OTHER): Payer: BLUE CROSS/BLUE SHIELD

## 2014-09-01 DIAGNOSIS — D649 Anemia, unspecified: Secondary | ICD-10-CM

## 2014-09-01 DIAGNOSIS — R710 Precipitous drop in hematocrit: Secondary | ICD-10-CM

## 2014-09-01 NOTE — Progress Notes (Signed)
Lab only 

## 2014-09-02 LAB — CBC WITH DIFFERENTIAL/PLATELET
BASOS ABS: 0 10*3/uL (ref 0.0–0.2)
Basos: 0 %
EOS (ABSOLUTE): 0.1 10*3/uL (ref 0.0–0.4)
Eos: 1 %
HEMOGLOBIN: 7.1 g/dL — AB (ref 11.1–15.9)
Hematocrit: 24.9 % — ABNORMAL LOW (ref 34.0–46.6)
Immature Grans (Abs): 0 10*3/uL (ref 0.0–0.1)
Immature Granulocytes: 0 %
LYMPHS ABS: 1.5 10*3/uL (ref 0.7–3.1)
Lymphs: 27 %
MCH: 20.6 pg — AB (ref 26.6–33.0)
MCHC: 28.5 g/dL — AB (ref 31.5–35.7)
MCV: 72 fL — ABNORMAL LOW (ref 79–97)
Monocytes Absolute: 0.4 10*3/uL (ref 0.1–0.9)
Monocytes: 7 %
NEUTROS ABS: 3.5 10*3/uL (ref 1.4–7.0)
Neutrophils: 65 %
PLATELETS: 163 10*3/uL (ref 150–379)
RBC: 3.44 x10E6/uL — ABNORMAL LOW (ref 3.77–5.28)
RDW: 19.1 % — ABNORMAL HIGH (ref 12.3–15.4)
WBC: 5.4 10*3/uL (ref 3.4–10.8)

## 2014-09-23 ENCOUNTER — Other Ambulatory Visit (INDEPENDENT_AMBULATORY_CARE_PROVIDER_SITE_OTHER): Payer: BLUE CROSS/BLUE SHIELD

## 2014-09-23 DIAGNOSIS — R799 Abnormal finding of blood chemistry, unspecified: Secondary | ICD-10-CM | POA: Diagnosis not present

## 2014-09-23 NOTE — Progress Notes (Signed)
Lab only 

## 2014-09-24 LAB — CBC WITH DIFFERENTIAL/PLATELET
BASOS: 0 %
Basophils Absolute: 0 10*3/uL (ref 0.0–0.2)
EOS (ABSOLUTE): 0.1 10*3/uL (ref 0.0–0.4)
EOS: 2 %
HEMATOCRIT: 34.3 % (ref 34.0–46.6)
Hemoglobin: 9.4 g/dL — ABNORMAL LOW (ref 11.1–15.9)
Immature Grans (Abs): 0 10*3/uL (ref 0.0–0.1)
Immature Granulocytes: 0 %
LYMPHS ABS: 2.2 10*3/uL (ref 0.7–3.1)
Lymphs: 30 %
MCH: 21.4 pg — AB (ref 26.6–33.0)
MCHC: 27.4 g/dL — ABNORMAL LOW (ref 31.5–35.7)
MCV: 78 fL — AB (ref 79–97)
MONOS ABS: 0.4 10*3/uL (ref 0.1–0.9)
Monocytes: 5 %
NEUTROS ABS: 4.5 10*3/uL (ref 1.4–7.0)
NEUTROS PCT: 63 %
PLATELETS: 191 10*3/uL (ref 150–379)
RBC: 4.39 x10E6/uL (ref 3.77–5.28)
RDW: 21.4 % — AB (ref 12.3–15.4)
WBC: 7.2 10*3/uL (ref 3.4–10.8)

## 2014-09-26 ENCOUNTER — Encounter: Payer: BLUE CROSS/BLUE SHIELD | Admitting: Pharmacist

## 2014-10-03 ENCOUNTER — Other Ambulatory Visit: Payer: BLUE CROSS/BLUE SHIELD

## 2014-10-03 DIAGNOSIS — R799 Abnormal finding of blood chemistry, unspecified: Secondary | ICD-10-CM

## 2014-10-04 LAB — CBC WITH DIFFERENTIAL/PLATELET
BASOS: 0 %
Basophils Absolute: 0 10*3/uL (ref 0.0–0.2)
EOS (ABSOLUTE): 0.1 10*3/uL (ref 0.0–0.4)
EOS: 2 %
HEMATOCRIT: 29.6 % — AB (ref 34.0–46.6)
Hemoglobin: 8.2 g/dL — CL (ref 11.1–15.9)
Immature Grans (Abs): 0 10*3/uL (ref 0.0–0.1)
Immature Granulocytes: 0 %
LYMPHS ABS: 1.6 10*3/uL (ref 0.7–3.1)
Lymphs: 29 %
MCH: 21.4 pg — ABNORMAL LOW (ref 26.6–33.0)
MCHC: 27.7 g/dL — AB (ref 31.5–35.7)
MCV: 77 fL — AB (ref 79–97)
MONOS ABS: 0.4 10*3/uL (ref 0.1–0.9)
Monocytes: 7 %
Neutrophils Absolute: 3.5 10*3/uL (ref 1.4–7.0)
Neutrophils: 62 %
Platelets: 222 10*3/uL (ref 150–379)
RBC: 3.84 x10E6/uL (ref 3.77–5.28)
RDW: 20.2 % — AB (ref 12.3–15.4)
WBC: 5.5 10*3/uL (ref 3.4–10.8)

## 2014-10-05 ENCOUNTER — Telehealth: Payer: Self-pay | Admitting: Nurse Practitioner

## 2014-10-05 NOTE — Telephone Encounter (Signed)
Stp and reviewed labs, also advised pt she has appt 9/23 with clinical pharmacist to recheck INR and CBC. Pt voiced understanding.

## 2014-10-07 ENCOUNTER — Ambulatory Visit (INDEPENDENT_AMBULATORY_CARE_PROVIDER_SITE_OTHER): Payer: BLUE CROSS/BLUE SHIELD | Admitting: Pharmacist

## 2014-10-07 ENCOUNTER — Other Ambulatory Visit: Payer: Self-pay

## 2014-10-07 DIAGNOSIS — E1165 Type 2 diabetes mellitus with hyperglycemia: Secondary | ICD-10-CM

## 2014-10-07 DIAGNOSIS — R739 Hyperglycemia, unspecified: Secondary | ICD-10-CM

## 2014-10-07 DIAGNOSIS — Z952 Presence of prosthetic heart valve: Secondary | ICD-10-CM

## 2014-10-07 DIAGNOSIS — Z954 Presence of other heart-valve replacement: Secondary | ICD-10-CM

## 2014-10-07 DIAGNOSIS — D649 Anemia, unspecified: Secondary | ICD-10-CM

## 2014-10-07 DIAGNOSIS — R7309 Other abnormal glucose: Secondary | ICD-10-CM | POA: Diagnosis not present

## 2014-10-07 LAB — POCT INR: INR: 3.1

## 2014-10-07 LAB — POCT HEMOGLOBIN: HEMOGLOBIN: 7.5 g/dL — AB (ref 12.2–16.2)

## 2014-10-07 LAB — GLUCOSE, POCT (MANUAL RESULT ENTRY): POC Glucose: 318 mg/dL — AB (ref 70–99)

## 2014-10-07 MED ORDER — METFORMIN HCL ER 500 MG PO TB24: 500.0000 mg | ORAL_TABLET | Freq: Every day | ORAL | Status: DC

## 2014-10-07 MED ORDER — SITAGLIPTIN PHOSPHATE 100 MG PO TABS: 100.0000 mg | ORAL_TABLET | Freq: Every day | ORAL | Status: DC

## 2014-10-07 NOTE — Patient Instructions (Signed)
Anticoagulation Dose Instructions as of 10/07/2014      Dorene Grebe Tue Wed Thu Fri Sat   New Dose 5 mg 5 mg 5 mg 5 mg 5 mg 2.5 mg 5 mg    Description        No warfarin today - Friday, September 23rd.  Then decrease dose to 1/2 tablet every Friday and 1 tablet all other days.     INR was 3.2 today

## 2014-10-07 NOTE — Progress Notes (Signed)
HPI:  Anna Salinas is here today to have PT/INR rechecked.  Over the last 2 month she has experienced low HBG.  She had RBC transfusion in July and then  Novasure procedure in August by Dr Evie Lacks. She currently reports that she has started bleeding vaginally again / menstruation for last 2 weeks.  SHe has notified Dr Evie Lacks of bleedind and she is to see him Monday.  Also noted that patient's last A1c was 9.1% (07/11/2014) asked about her HBG readings and she states that they have ranged from 190's to 300's.  SHe has stopped metformin due to diarrhea / loose stools.    Objective data  INR was 3.1  HBG = 7.5 (was 8.2 - 4 days ago)  RBG = 318 in office today  Assessment HGB continues to decrease despite iron supplementation therapeutic anticoagulation Uncontrolled type 2 DM  Plan.  1. DIscussed low HGB with PCP. Will recheck.  Patient is encouraged to follow up with Dr Evie Lacks Monday as planned.  2.  Restart metformin XR 500mg  - take 1 tablet daily with food.  Also start Januvia 100mg  take 1 tablet daily 3.   Anticoagulation Dose Instructions as of 10/07/2014      Dorene Grebe Tue Wed Thu Fri Sat   New Dose 5 mg 5 mg 5 mg 5 mg 5 mg 2.5 mg 5 mg    Description        No warfarin today - Friday, September 23rd.  Then decrease dose to 1/2 tablet every Friday and 1 tablet all other days.     RTC in 2 weeks to check BG and INR  Cherre Robins, PharmD, CPP

## 2014-10-08 LAB — CBC WITH DIFFERENTIAL/PLATELET
BASOS: 1 %
Basophils Absolute: 0 10*3/uL (ref 0.0–0.2)
EOS (ABSOLUTE): 0.1 10*3/uL (ref 0.0–0.4)
EOS: 2 %
HEMATOCRIT: 28 % — AB (ref 34.0–46.6)
HEMOGLOBIN: 7.6 g/dL — AB (ref 11.1–15.9)
IMMATURE GRANS (ABS): 0 10*3/uL (ref 0.0–0.1)
IMMATURE GRANULOCYTES: 0 %
LYMPHS: 31 %
Lymphocytes Absolute: 1.6 10*3/uL (ref 0.7–3.1)
MCH: 20.9 pg — ABNORMAL LOW (ref 26.6–33.0)
MCHC: 27.1 g/dL — ABNORMAL LOW (ref 31.5–35.7)
MCV: 77 fL — AB (ref 79–97)
MONOCYTES: 8 %
Monocytes Absolute: 0.4 10*3/uL (ref 0.1–0.9)
NEUTROS PCT: 58 %
Neutrophils Absolute: 2.9 10*3/uL (ref 1.4–7.0)
PLATELETS: 190 10*3/uL (ref 150–379)
RBC: 3.63 x10E6/uL — ABNORMAL LOW (ref 3.77–5.28)
RDW: 19.8 % — ABNORMAL HIGH (ref 12.3–15.4)
WBC: 5 10*3/uL (ref 3.4–10.8)

## 2014-10-11 ENCOUNTER — Other Ambulatory Visit (INDEPENDENT_AMBULATORY_CARE_PROVIDER_SITE_OTHER): Payer: BLUE CROSS/BLUE SHIELD

## 2014-10-11 DIAGNOSIS — D649 Anemia, unspecified: Secondary | ICD-10-CM

## 2014-10-11 NOTE — Progress Notes (Signed)
Lab only 

## 2014-10-12 LAB — CBC WITH DIFFERENTIAL/PLATELET
BASOS: 1 %
Basophils Absolute: 0 10*3/uL (ref 0.0–0.2)
EOS (ABSOLUTE): 0.1 10*3/uL (ref 0.0–0.4)
EOS: 2 %
HEMATOCRIT: 28.8 % — AB (ref 34.0–46.6)
Hemoglobin: 8.1 g/dL — CL (ref 11.1–15.9)
IMMATURE GRANS (ABS): 0 10*3/uL (ref 0.0–0.1)
IMMATURE GRANULOCYTES: 0 %
LYMPHS: 28 %
Lymphocytes Absolute: 1.7 10*3/uL (ref 0.7–3.1)
MCH: 21.6 pg — ABNORMAL LOW (ref 26.6–33.0)
MCHC: 28.1 g/dL — AB (ref 31.5–35.7)
MCV: 77 fL — AB (ref 79–97)
Monocytes Absolute: 0.4 10*3/uL (ref 0.1–0.9)
Monocytes: 6 %
NEUTROS PCT: 63 %
Neutrophils Absolute: 3.8 10*3/uL (ref 1.4–7.0)
Platelets: 187 10*3/uL (ref 150–379)
RBC: 3.75 x10E6/uL — AB (ref 3.77–5.28)
RDW: 19 % — AB (ref 12.3–15.4)
WBC: 6.1 10*3/uL (ref 3.4–10.8)

## 2014-10-20 ENCOUNTER — Other Ambulatory Visit: Payer: BLUE CROSS/BLUE SHIELD

## 2014-10-20 DIAGNOSIS — R799 Abnormal finding of blood chemistry, unspecified: Secondary | ICD-10-CM

## 2014-10-20 NOTE — Progress Notes (Signed)
Lab only 

## 2014-10-21 LAB — CBC WITH DIFFERENTIAL/PLATELET
BASOS: 0 %
Basophils Absolute: 0 10*3/uL (ref 0.0–0.2)
EOS (ABSOLUTE): 0.1 10*3/uL (ref 0.0–0.4)
EOS: 2 %
HEMATOCRIT: 30.8 % — AB (ref 34.0–46.6)
HEMOGLOBIN: 8.3 g/dL — AB (ref 11.1–15.9)
IMMATURE GRANS (ABS): 0 10*3/uL (ref 0.0–0.1)
IMMATURE GRANULOCYTES: 0 %
LYMPHS: 24 %
Lymphocytes Absolute: 1.4 10*3/uL (ref 0.7–3.1)
MCH: 20.1 pg — AB (ref 26.6–33.0)
MCHC: 26.9 g/dL — ABNORMAL LOW (ref 31.5–35.7)
MCV: 75 fL — AB (ref 79–97)
MONOCYTES: 7 %
Monocytes Absolute: 0.4 10*3/uL (ref 0.1–0.9)
NEUTROS ABS: 3.7 10*3/uL (ref 1.4–7.0)
NEUTROS PCT: 67 %
PLATELETS: 162 10*3/uL (ref 150–379)
RBC: 4.13 x10E6/uL (ref 3.77–5.28)
RDW: 18.1 % — ABNORMAL HIGH (ref 12.3–15.4)
WBC: 5.6 10*3/uL (ref 3.4–10.8)

## 2014-10-26 ENCOUNTER — Ambulatory Visit: Payer: Self-pay | Admitting: Pharmacist

## 2014-11-17 ENCOUNTER — Telehealth: Payer: Self-pay | Admitting: Nurse Practitioner

## 2014-11-28 ENCOUNTER — Encounter (HOSPITAL_COMMUNITY): Payer: Self-pay | Admitting: *Deleted

## 2014-11-28 ENCOUNTER — Ambulatory Visit (INDEPENDENT_AMBULATORY_CARE_PROVIDER_SITE_OTHER): Payer: BLUE CROSS/BLUE SHIELD | Admitting: Pharmacist

## 2014-11-28 ENCOUNTER — Encounter: Payer: Self-pay | Admitting: Pharmacist

## 2014-11-28 ENCOUNTER — Emergency Department (HOSPITAL_COMMUNITY)
Admission: EM | Admit: 2014-11-28 | Discharge: 2014-11-28 | Disposition: A | Discharge status: Home / Self Care | Payer: BLUE CROSS/BLUE SHIELD | Attending: Emergency Medicine | Admitting: Emergency Medicine

## 2014-11-28 VITALS — BP 136/82 | HR 80 | Ht 67.0 in | Wt 224.0 lb

## 2014-11-28 DIAGNOSIS — R011 Cardiac murmur, unspecified: Secondary | ICD-10-CM | POA: Diagnosis not present

## 2014-11-28 DIAGNOSIS — E1165 Type 2 diabetes mellitus with hyperglycemia: Secondary | ICD-10-CM

## 2014-11-28 DIAGNOSIS — Z794 Long term (current) use of insulin: Secondary | ICD-10-CM | POA: Insufficient documentation

## 2014-11-28 DIAGNOSIS — Z79899 Other long term (current) drug therapy: Secondary | ICD-10-CM | POA: Insufficient documentation

## 2014-11-28 DIAGNOSIS — I10 Essential (primary) hypertension: Secondary | ICD-10-CM | POA: Insufficient documentation

## 2014-11-28 DIAGNOSIS — Z7901 Long term (current) use of anticoagulants: Secondary | ICD-10-CM | POA: Diagnosis not present

## 2014-11-28 DIAGNOSIS — D649 Anemia, unspecified: Secondary | ICD-10-CM

## 2014-11-28 DIAGNOSIS — Z954 Presence of other heart-valve replacement: Secondary | ICD-10-CM | POA: Diagnosis not present

## 2014-11-28 DIAGNOSIS — Z952 Presence of prosthetic heart valve: Secondary | ICD-10-CM

## 2014-11-28 DIAGNOSIS — R739 Hyperglycemia, unspecified: Secondary | ICD-10-CM

## 2014-11-28 LAB — BASIC METABOLIC PANEL
Anion gap: 8 (ref 5–15)
BUN: 10 mg/dL (ref 6–20)
CALCIUM: 9 mg/dL (ref 8.9–10.3)
CO2: 25 mmol/L (ref 22–32)
Chloride: 99 mmol/L — ABNORMAL LOW (ref 101–111)
Creatinine, Ser: 0.63 mg/dL (ref 0.44–1.00)
GFR calc Af Amer: >60 mL/min (ref >60)
GLUCOSE: 403 mg/dL — AB (ref 65–99)
POTASSIUM: 3.7 mmol/L (ref 3.5–5.1)
Sodium: 132 mmol/L — ABNORMAL LOW (ref 135–145)

## 2014-11-28 LAB — POCT GLYCOSYLATED HEMOGLOBIN (HGB A1C): Hemoglobin A1C: 10.2

## 2014-11-28 LAB — POCT URINALYSIS DIPSTICK
BILIRUBIN UA: NEGATIVE
Blood, UA: NEGATIVE
GLUCOSE UA: 250
Leukocytes, UA: NEGATIVE
NITRITE UA: NEGATIVE
Protein, UA: NEGATIVE
Spec Grav, UA: 1.015
UROBILINOGEN UA: NEGATIVE
pH, UA: 6

## 2014-11-28 LAB — CBC
HEMATOCRIT: 30.7 % — AB (ref 36.0–46.0)
Hemoglobin: 8.6 g/dL — ABNORMAL LOW (ref 12.0–15.0)
MCH: 19.6 pg — AB (ref 26.0–34.0)
MCHC: 28 g/dL — AB (ref 30.0–36.0)
MCV: 70.1 fL — ABNORMAL LOW (ref 78.0–100.0)
Platelets: 164 10*3/uL (ref 150–400)
RBC: 4.38 MIL/uL (ref 3.87–5.11)
RDW: 17.5 % — AB (ref 11.5–15.5)
WBC: 7.8 10*3/uL (ref 4.0–10.5)

## 2014-11-28 LAB — CBG MONITORING, ED: Glucose-Capillary: 350 mg/dL — ABNORMAL HIGH (ref 65–99)

## 2014-11-28 LAB — POCT INR: INR: 2.9

## 2014-11-28 MED ORDER — WARFARIN SODIUM 5 MG PO TABS
ORAL_TABLET | ORAL | Status: DC
Start: 2014-11-28 — End: 2015-03-31

## 2014-11-28 MED ORDER — PEN NEEDLES 32G X 4 MM MISC: 1.0000 | Freq: Every day | Status: DC

## 2014-11-28 MED ORDER — INSULIN DEGLUDEC 100 UNIT/ML ~~LOC~~ SOPN: 10.0000 [IU] | PEN_INJECTOR | Freq: Every day | SUBCUTANEOUS | Status: DC

## 2014-11-28 MED ORDER — METFORMIN HCL ER 500 MG PO TB24: 500.0000 mg | ORAL_TABLET | Freq: Every day | ORAL | Status: DC

## 2014-11-28 NOTE — Discharge Instructions (Signed)
Hyperglycemia °Hyperglycemia occurs when the glucose (sugar) in your blood is too high. Hyperglycemia can happen for many reasons, but it most often happens to people who do not know they have diabetes or are not managing their diabetes properly.  °CAUSES  °Whether you have diabetes or not, there are other causes of hyperglycemia. Hyperglycemia can occur when you have diabetes, but it can also occur in other situations that you might not be as aware of, such as: °Diabetes °· If you have diabetes and are having problems controlling your blood glucose, hyperglycemia could occur because of some of the following reasons: °¨ Not following your meal plan. °¨ Not taking your diabetes medications or not taking it properly. °¨ Exercising less or doing less activity than you normally do. °¨ Being sick. °Pre-diabetes °· This cannot be ignored. Before people develop Type 2 diabetes, they almost always have "pre-diabetes." This is when your blood glucose levels are higher than normal, but not yet high enough to be diagnosed as diabetes. Research has shown that some long-term damage to the body, especially the heart and circulatory system, may already be occurring during pre-diabetes. If you take action to manage your blood glucose when you have pre-diabetes, you may delay or prevent Type 2 diabetes from developing. °Stress °· If you have diabetes, you may be "diet" controlled or on oral medications or insulin to control your diabetes. However, you may find that your blood glucose is higher than usual in the hospital whether you have diabetes or not. This is often referred to as "stress hyperglycemia." Stress can elevate your blood glucose. This happens because of hormones put out by the body during times of stress. If stress has been the cause of your high blood glucose, it can be followed regularly by your caregiver. That way he/she can make sure your hyperglycemia does not continue to get worse or progress to  diabetes. °Steroids °· Steroids are medications that act on the infection fighting system (immune system) to block inflammation or infection. One side effect can be a rise in blood glucose. Most people can produce enough extra insulin to allow for this rise, but for those who cannot, steroids make blood glucose levels go even higher. It is not unusual for steroid treatments to "uncover" diabetes that is developing. It is not always possible to determine if the hyperglycemia will go away after the steroids are stopped. A special blood test called an A1c is sometimes done to determine if your blood glucose was elevated before the steroids were started. °SYMPTOMS °· Thirsty. °· Frequent urination. °· Dry mouth. °· Blurred vision. °· Tired or fatigue. °· Weakness. °· Sleepy. °· Tingling in feet or leg. °DIAGNOSIS  °Diagnosis is made by monitoring blood glucose in one or all of the following ways: °· A1c test. This is a chemical found in your blood. °· Fingerstick blood glucose monitoring. °· Laboratory results. °TREATMENT  °First, knowing the cause of the hyperglycemia is important before the hyperglycemia can be treated. Treatment may include, but is not be limited to: °· Education. °· Change or adjustment in medications. °· Change or adjustment in meal plan. °· Treatment for an illness, infection, etc. °· More frequent blood glucose monitoring. °· Change in exercise plan. °· Decreasing or stopping steroids. °· Lifestyle changes. °HOME CARE INSTRUCTIONS  °· Test your blood glucose as directed. °· Exercise regularly. Your caregiver will give you instructions about exercise. Pre-diabetes or diabetes which comes on with stress is helped by exercising. °· Eat wholesome,   balanced meals. Eat often and at regular, fixed times. Your caregiver or nutritionist will give you a meal plan to guide your sugar intake. °· Being at an ideal weight is important. If needed, losing as little as 10 to 15 pounds may help improve blood  glucose levels. °SEEK MEDICAL CARE IF:  °· You have questions about medicine, activity, or diet. °· You continue to have symptoms (problems such as increased thirst, urination, or weight gain). °SEEK IMMEDIATE MEDICAL CARE IF:  °· You are vomiting or have diarrhea. °· Your breath smells fruity. °· You are breathing faster or slower. °· You are very sleepy or incoherent. °· You have numbness, tingling, or pain in your feet or hands. °· You have chest pain. °· Your symptoms get worse even though you have been following your caregiver's orders. °· If you have any other questions or concerns. °  °This information is not intended to replace advice given to you by your health care provider. Make sure you discuss any questions you have with your health care provider. °  °Document Released: 06/26/2000 Document Revised: 03/25/2011 Document Reviewed: 09/06/2014 °[employer]vier Interactive Patient Education ©2016 [employer] ° °

## 2014-11-28 NOTE — Patient Instructions (Addendum)
Anticoagulation Dose Instructions as of 11/28/2014      Anna Salinas Tue Wed Thu Fri Sat   New Dose 5 mg 5 mg 5 mg 5 mg 5 mg 2.5 mg 5 mg    Description        Continue current warfarin dose of 5mg  - take 1/2 tablet every Friday and 1 tablet all other days.     INR was 2.9 today  Go to ER  Start Tresiba - 10 units daily for next 4 days.  If blood glucose if still over 120 in the morning then increase by 1 unit daily until blood glucose is 120 or less (fasting) in the morning.  Continue metformin XR 500mg  once a day and Januvia 100mg  daily.   Hypoglycemia Hypoglycemia occurs when the glucose in your blood is too low. Glucose is a type of sugar that is your body's main energy source. Hormones, such as insulin and glucagon, control the level of glucose in the blood. Insulin lowers blood glucose and glucagon increases blood glucose. Having too much insulin in your blood stream, or not eating enough food containing sugar, can result in hypoglycemia. Hypoglycemia can happen to people with or without diabetes. It can develop quickly and can be a medical emergency.  CAUSES   Missing or delaying meals.  Not eating enough carbohydrates at meals.  Taking too much diabetes medicine.  Not timing your oral diabetes medicine or insulin doses with meals, snacks, and exercise.  Nausea and vomiting.  Certain medicines.  Severe illnesses, such as hepatitis, kidney disorders, and certain eating disorders.  Increased activity or exercise without eating something extra or adjusting medicines.  Drinking too much alcohol.  A nerve disorder that affects body functions like your heart rate, blood pressure, and digestion (autonomic neuropathy).  A condition where the stomach muscles do not function properly (gastroparesis). Therefore, medicines and food may not absorb properly.  Rarely, a tumor of the pancreas can produce too much insulin. SYMPTOMS   Hunger.  Sweating (diaphoresis).  Change in body  temperature.  Shakiness.  Headache.  Anxiety.  Lightheadedness.  Irritability.  Difficulty concentrating.  Dry mouth.  Tingling or numbness in the hands or feet.  Restless sleep or sleep disturbances.  Altered speech and coordination.  Change in mental status.  Seizures or prolonged convulsions.  Combativeness.  Drowsiness (lethargic).  Weakness.  Increased heart rate or palpitations.  Confusion.  Pale, gray skin color.  Blurred or double vision.  Fainting. DIAGNOSIS  A physical exam and medical history will be performed. Your caregiver may make a diagnosis based on your symptoms. Blood tests and other lab tests may be performed to confirm a diagnosis. Once the diagnosis is made, your caregiver will see if your signs and symptoms go away once your blood glucose is raised.  TREATMENT  Usually, you can easily treat your hypoglycemia when you notice symptoms.  Check your blood glucose. If it is less than 70 mg/dl, take one of the following:   3-4 glucose tablets.    cup juice.    cup regular soda.   1 cup skim milk.   -1 tube of glucose gel.   5-6 hard candies.   Avoid high-fat drinks or food that may delay a rise in blood glucose levels.  Do not take more than the recommended amount of sugary foods, drinks, gel, or tablets. Doing so will cause your blood glucose to go too high.   Wait 10-15 minutes and recheck your blood glucose. If it  is still less than 70 mg/dl or below your target range, repeat treatment.   Eat a snack if it is more than 1 hour until your next meal.  There may be a time when your blood glucose may go so low that you are unable to treat yourself at home when you start to notice symptoms. You may need someone to help you. You may even faint or be unable to swallow. If you cannot treat yourself, someone will need to bring you to the hospital.  [place]  If you have diabetes, follow your diabetes management  plan by:  Taking your medicines as directed.  Following your exercise plan.  Following your meal plan. Do not skip meals. Eat on time.  Testing your blood glucose regularly. Check your blood glucose before and after exercise. If you exercise longer or different than usual, be sure to check blood glucose more frequently.  Wearing your medical alert jewelry that says you have diabetes.  Identify the cause of your hypoglycemia. Then, develop ways to prevent the recurrence of hypoglycemia.  Do not take a hot bath or shower right after an insulin shot.  Always carry treatment with you. Glucose tablets are the easiest to carry.  If you are going to drink alcohol, drink it only with meals.  Tell friends or family members ways to keep you safe during a seizure. This may include removing hard or sharp objects from the area or turning you on your side.  Maintain a healthy weight. SEEK MEDICAL CARE IF:   You are having problems keeping your blood glucose in your target range.  You are having frequent episodes of hypoglycemia.  You feel you might be having side effects from your medicines.  You are not sure why your blood glucose is dropping so low.  You notice a change in vision or a new problem with your vision. SEEK IMMEDIATE MEDICAL CARE IF:   Confusion develops.  A change in mental status occurs.  The inability to swallow develops.  Fainting occurs.   This information is not intended to replace advice given to you by your health care provider. Make sure you discuss any questions you have with your health care provider.   Document Released: 12/31/2004 Document Revised: 01/05/2013 Document Reviewed: 09/06/2014 Elsevier Interactive Patient Education [employer].

## 2014-11-28 NOTE — ED Provider Notes (Signed)
CSN: [id number]     Arrival date & time 11/28/14  1604 History   First MD Initiated Contact with Patient 11/28/14 1709     Chief Complaint  Patient presents with  . Hyperglycemia     (Consider location/radiation/quality/duration/timing/severity/associated sxs/prior Treatment) Patient is a 44 y.o. female presenting with hyperglycemia. The history is provided by the patient.  Hyperglycemia Associated symptoms: no abdominal pain, no increased thirst, no polyuria and no shortness of breath    patient presents with hyperglycemia. States she was having a normal visit to have her Coumadin checked and her sugar was checked and found the also 500. Patient states she cheated by having juice and a jelly donut today. States she's been behaving herself otherwise. She is a diabetic. Patient states she feels fine otherwise. No dysuria. Denies possibility of pregnancy. She states her INR was 2.8. Patient states that she had been out of her insulin testing strips but still been taking her insulin.  Past Medical History  Diagnosis Date  . Essential hypertension, benign   . Palpitations   . Aortic stenosis due to bicuspid aortic valve   . Type 2 diabetes mellitus [facility])    Past Surgical History  Procedure Laterality Date  . Cesarean section  2009  . Tubal ligation    . Left and right heart catheterization with coronary angiogram N/A 01/25/2014    Procedure: LEFT AND RIGHT HEART CATHETERIZATION WITH CORONARY ANGIOGRAM;  Surgeon: Peter M Martinique, MD;  Location: [facility] CATH LAB;  Service: Cardiovascular;  Laterality: N/A;  . Cardiac catheterization    . Aortic valve replacement N/A 02/14/2014    Procedure: AORTIC VALVE REPLACEMENT (AVR);  Surgeon: Gaye Pollack, MD;  Location: [place];  Service: Open Heart Surgery;  Laterality: N/A;  . Intraoperative transesophageal echocardiogram N/A 02/14/2014    Procedure: INTRAOPERATIVE TRANSESOPHAGEAL ECHOCARDIOGRAM;  Surgeon: Gaye Pollack, MD;  Location: [facility] OR;  Service: Open  Heart Surgery;  Laterality: N/A;  . Subxyphoid pericardial window N/A 03/27/2014    Procedure: SUBXYPHOID PERICARDIAL WINDOW;  Surgeon: Grace Isaac, MD;  Location: [place];  Service: Thoracic;  Laterality: N/A;  . Chest tube insertion Right 03/27/2014    Procedure: CHEST TUBE INSERTION;  Surgeon: Grace Isaac, MD;  Location: [place];  Service: Thoracic;  Laterality: Right;  . Pleural effusion drainage Right 03/27/2014    Procedure: DRAINAGE OF PLEURAL EFFUSION;  Surgeon: Grace Isaac, MD;  Location: [place];  Service: Thoracic;  Laterality: Right;  Drainage of right pleural effusion  . Pericardial fluid drainage N/A 03/27/2014    Procedure: DRAINAGE OF PERICARDIAL FLUID;  Surgeon: Grace Isaac, MD;  [facility] OR;  Service: Thoracic;  Laterality: N/A;   Family History  Problem Relation Age of Onset  . Diabetes Mother   . Brain cancer Brother   . Cancer Maternal Grandmother    Social History  Substance Use Topics  . Smoking status: Never Smoker   . Smokeless tobacco: Never Used  . Alcohol Use: No   OB History    No data available     Review of Systems  Constitutional: Negative for appetite change.  Respiratory: Negative for chest tightness and shortness of breath.   Gastrointestinal: Negative for abdominal pain.  Endocrine: Negative for polydipsia, polyphagia and polyuria.  Genitourinary: Negative for flank pain.  Musculoskeletal: Negative for back pain.  Skin: Negative for rash and wound.      Allergies  Glipizide; Metoprolol; and Shellfish allergy  Home Medications   Prior  to Admission medications   Medication Sig Start Date End Date Taking? Authorizing Provider  acetaminophen (TYLENOL) 325 MG tablet Take 650 mg by mouth every 6 (six) hours as needed (pain).   Yes Historical Provider, MD  metFORMIN (GLUCOPHAGE XR) 500 MG 24 hr tablet Take 1 tablet (500 mg total) by mouth daily with breakfast. 11/28/14  Yes Tammy Eckard, PHARMD  sitaGLIPtin (JANUVIA) 100 MG  tablet Take 1 tablet (100 mg total) by mouth daily. 10/07/14  Yes Tammy Eckard, PHARMD  warfarin (COUMADIN) 5 MG tablet Take 1 tablet by mouth daily or as directed by coumadin clinic Patient taking differently: Take 2.5-5 mg by mouth at bedtime. Take 2.5mg   On Fridays only 11/28/14  Yes Tammy Eckard, PHARMD  furosemide (LASIX) 20 MG tablet Take 1 to 2 tablets by mouth daily as directed Patient not taking: Reported on 11/28/2014 03/23/14   Peter M Martinique, MD  Insulin Degludec (TRESIBA FLEXTOUCH) 100 UNIT/ML SOPN Inject 10 Units into the skin daily. 11/28/14   Tammy Eckard, PHARMD  Insulin Pen Needle (PEN NEEDLES) 32G X 4 MM MISC 1 each by Does not apply route daily. Use with insulin pen to administer once daily 11/28/14   Tammy Eckard, PHARMD  Potassium Chloride ER 20 MEQ TBCR Take 20 mEq by mouth daily. Patient not taking: Reported on 11/28/2014 03/23/14   Peter M Martinique, MD   BP 130/65 mmHg  Pulse 88  Temp(Src) 98 F (36.7 C) (Oral)  Resp 18  Ht 5\' 7"  (1.702 m)  Wt 224 lb (101.606 kg)  BMI 35.08 kg/m2  SpO2 99% Physical Exam  Constitutional: She appears well-developed and well-nourished.  HENT:  Head: Atraumatic.  Neck: Neck supple.  Cardiovascular: Normal rate.   Murmur heard. Pulmonary/Chest: Effort normal.  Abdominal: Soft.  Musculoskeletal: Normal range of motion.  Neurological: She is alert.  Skin: Skin is warm.    ED Course  Procedures (including critical care time) Labs Review Labs Reviewed  BASIC METABOLIC PANEL - Abnormal; Notable for the following:    Sodium 132 (*)    Chloride 99 (*)    Glucose, Bld 403 (*)    All other components within normal limits  CBC - Abnormal; Notable for the following:    Hemoglobin 8.6 (*)    HCT 30.7 (*)    MCV 70.1 (*)    MCH 19.6 (*)    MCHC 28.0 (*)    RDW 17.5 (*)    All other components within normal limits  CBG MONITORING, ED - Abnormal; Notable for the following:    Glucose-Capillary 350 (*)    All other components within  normal limits    Imaging Review No results found. I have personally reviewed and evaluated these images and lab results as part of my medical decision-making.   EKG Interpretation None      MDM   Final diagnoses:  Hyperglycemia    Patient with hyperglycemia. No DKA. Hemoglobin is at baseline. Sugars come down to 350. Likely due to noncompliance. Has insulin home and will be discharged.    Davonna Belling, MD 11/28/14 [phone]

## 2014-11-28 NOTE — ED Notes (Signed)
Patient went to PCP to have coumadin level checked and blood sugar was 465 and was sent her for eval.

## 2014-11-28 NOTE — Progress Notes (Signed)
Subjective:   Anna Salinas is here today to have PT/INR rechecked and to recheck type 2 DM. At our last visit patient restart metformin XR $RemoveBefo'500mg'HNNXsUbJwMY$  daily (she had difficulty tolerating higher doses due to diarrhea).  Also started Januvia $RemoveBeforeD'100mg'BotOkqLkSpuUsu$  qd.  Patient states however that she misses both medications yesterday and today.  Also over the last 4 months she has experienced low hemoglobin.  She had RBC transfusion in July and then  Novasure procedure in August by Dr Evie Lacks.   Also noted that patient's last A1c was 9.1% (07/11/2014) asked about her HBG readings and she states that they have ranged from 199 to 400's.     Patient does report increase thirst; denies polyuria Weight has decrease about 7lbs in last 4 months.  Objective:  Filed Vitals:   11/28/14 2148  BP: 136/82  Pulse: 80   Filed Weights   11/28/14 2148  Weight: 224 lb (101.606 kg)   Body mass index is 35.08 kg/(m^2).  INR was 2.9 HBG = 7.5 (was 8.2 - 4 days ago)  RBG = 465 in office today A1c = 10.2% today in office.  RBG was 465 Urinalysis revealed + trace ketones; + glucose   Assessment therapeutic anticoagulation Uncontrolled type 2 DM - with positive ketones in urine today  Plan.  1. Patient referred to ER.  Her mother is with her son and is meeting patient at the office so she will take her to ER.  2.  Continue metformin XR $RemoveBefo'500mg'bvYFiRSJBIT$   And Januvia $RemoveBef'100mg'RPVjILtyER$  take 1 tablet daily. 3.  Gave sample of Tresiba - patient is instructed to wait until after ER visit and will start Tresiba 10 units once daily.  After 4 days if FBG is still over 120 she is to increase by 1 unit daily until BG is less than 120 in am.   Anticoagulation Dose Instructions as of 11/28/2014      Dorene Grebe Tue Wed Thu Fri Sat   New Dose 5 mg 5 mg 5 mg 5 mg 5 mg 2.5 mg 5 mg    Description        Continue current warfarin dose of $Remov'5mg'XHwTxs$  - take 1/2 tablet every Friday and 1 tablet all other days.      Orders Placed This Encounter  Procedures  . BMP8+EGFR   . CBC with Differential/Platelet  . POCT glycosylated hemoglobin (Hb A1C)  . POCT urinalysis dipstick   RTC in 1 week to follow up  Cherre Robins, PharmD, CPP, CDE      Cherre Robins, PharmD, CPP

## 2014-11-28 NOTE — ED Notes (Signed)
CBG 350 

## 2014-11-29 LAB — BMP8+EGFR
BUN/Creatinine Ratio: 12 (ref 9–23)
BUN: 8 mg/dL (ref 6–24)
CALCIUM: 9.2 mg/dL (ref 8.7–10.2)
CO2: 26 mmol/L (ref 18–29)
CREATININE: 0.65 mg/dL (ref 0.57–1.00)
Chloride: 95 mmol/L — ABNORMAL LOW (ref 97–106)
GFR, EST AFRICAN AMERICAN: 127 mL/min/{1.73_m2} (ref >59)
GFR, EST NON AFRICAN AMERICAN: 110 mL/min/{1.73_m2} (ref >59)
Glucose: 450 mg/dL (ref 65–99)
POTASSIUM: 4.1 mmol/L (ref 3.5–5.2)
Sodium: 134 mmol/L — ABNORMAL LOW (ref 136–144)

## 2014-11-29 LAB — CBC WITH DIFFERENTIAL/PLATELET
BASOS ABS: 0.1 10*3/uL (ref 0.0–0.2)
BASOS: 1 %
EOS (ABSOLUTE): 0.3 10*3/uL (ref 0.0–0.4)
EOS: 3 %
HEMOGLOBIN: 9.1 g/dL — AB (ref 11.1–15.9)
Hematocrit: 33.2 % — ABNORMAL LOW (ref 34.0–46.6)
IMMATURE GRANS (ABS): 0 10*3/uL (ref 0.0–0.1)
IMMATURE GRANULOCYTES: 0 %
LYMPHS: 20 %
Lymphocytes Absolute: 1.6 10*3/uL (ref 0.7–3.1)
MCH: 19.3 pg — AB (ref 26.6–33.0)
MCHC: 27.4 g/dL — ABNORMAL LOW (ref 31.5–35.7)
MCV: 70 fL — ABNORMAL LOW (ref 79–97)
MONOCYTES: 5 %
Monocytes Absolute: 0.4 10*3/uL (ref 0.1–0.9)
NEUTROS ABS: 5.6 10*3/uL (ref 1.4–7.0)
Neutrophils: 71 %
Platelets: 222 10*3/uL (ref 150–379)
RBC: 4.72 x10E6/uL (ref 3.77–5.28)
RDW: 17.1 % — ABNORMAL HIGH (ref 12.3–15.4)
WBC: 8 10*3/uL (ref 3.4–10.8)

## 2014-11-30 ENCOUNTER — Other Ambulatory Visit: Payer: Self-pay | Admitting: Pharmacist

## 2014-11-30 MED ORDER — GLUCOSE BLOOD VI STRP: ORAL_STRIP | Status: DC

## 2014-11-30 MED ORDER — ONETOUCH DELICA LANCETS FINE MISC: Status: DC

## 2014-12-06 ENCOUNTER — Ambulatory Visit (INDEPENDENT_AMBULATORY_CARE_PROVIDER_SITE_OTHER): Payer: BLUE CROSS/BLUE SHIELD | Admitting: Cardiology

## 2014-12-06 ENCOUNTER — Encounter: Payer: Self-pay | Admitting: Cardiology

## 2014-12-06 VITALS — BP 124/74 | HR 98 | Ht 67.0 in | Wt 221.9 lb

## 2014-12-06 DIAGNOSIS — Z7901 Long term (current) use of anticoagulants: Secondary | ICD-10-CM

## 2014-12-06 DIAGNOSIS — I970 Postcardiotomy syndrome: Secondary | ICD-10-CM

## 2014-12-06 DIAGNOSIS — Z952 Presence of prosthetic heart valve: Secondary | ICD-10-CM

## 2014-12-06 DIAGNOSIS — Z954 Presence of other heart-valve replacement: Secondary | ICD-10-CM | POA: Diagnosis not present

## 2014-12-06 NOTE — Patient Instructions (Signed)
Continue your current therapy  I will see you in one year   

## 2014-12-06 NOTE — Progress Notes (Signed)
CARDIOLOGY OFFICE NOTE  Date:  12/06/2014    Anna Salinas Date of Birth: Jan 19, 1970 Medical Record [id number]  PCP:  Chevis Pretty, FNP  Cardiologist:  Martinique    Chief Complaint  Patient presents with  . Follow-up    no chest pain no edema no light headedness or dizziness  . Shortness of Breath    a little      History of Present Illness: Anna Salinas is a 44 y.o. female who presents for follow up AVR.  She has a history of AVR in 02/2014. She has been on coumadin.  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 These were drained. Repeat CXR and Echo in April 2016 showed resolution of effusions.   On follow up today she is doing very well. Denies SOB. mild sternal pain at times.  She has returned to work. Noted some swelling one day after she had been on her feet all day at work. Resolves with elevation.  No palpitations. Coumadin has been therapeutic. Blood sugars were higher but improved with addition of Januvia.  Past Medical History  Diagnosis Date  . Essential hypertension, benign   . Palpitations   . Aortic stenosis due to bicuspid aortic valve   . Type 2 diabetes mellitus [facility])     Past Surgical History  Procedure Laterality Date  . Cesarean section  2009  . Tubal ligation    . Left and right heart catheterization with coronary angiogram N/A 01/25/2014    Procedure: LEFT AND RIGHT HEART CATHETERIZATION WITH CORONARY ANGIOGRAM;  Surgeon: Peter M Martinique, MD;  Location: [facility] CATH LAB;  Service: Cardiovascular;  Laterality: N/A;  . Cardiac catheterization    . Aortic valve replacement N/A 02/14/2014    Procedure: AORTIC VALVE REPLACEMENT (AVR);  Surgeon: Gaye Pollack, MD;  Location: [place];  Service: Open Heart Surgery;  Laterality: N/A;  . Intraoperative transesophageal echocardiogram N/A 02/14/2014    Procedure: INTRAOPERATIVE TRANSESOPHAGEAL ECHOCARDIOGRAM;  Surgeon: Gaye Pollack, MD;  Location: [facility] OR;   Service: Open Heart Surgery;  Laterality: N/A;  . Subxyphoid pericardial window N/A 03/27/2014    Procedure: SUBXYPHOID PERICARDIAL WINDOW;  Surgeon: Grace Isaac, MD;  Location: [place];  Service: Thoracic;  Laterality: N/A;  . Chest tube insertion Right 03/27/2014    Procedure: CHEST TUBE INSERTION;  Surgeon: Grace Isaac, MD;  Location: [place];  Service: Thoracic;  Laterality: Right;  . Pleural effusion drainage Right 03/27/2014    Procedure: DRAINAGE OF PLEURAL EFFUSION;  Surgeon: Grace Isaac, MD;  Location: [place];  Service: Thoracic;  Laterality: Right;  Drainage of right pleural effusion  . Pericardial fluid drainage N/A 03/27/2014    Procedure: DRAINAGE OF PERICARDIAL FLUID;  Surgeon: Grace Isaac, MD;  Location: [facility] OR;  Service: Thoracic;  Laterality: N/A;     Medications: Current Outpatient Prescriptions  Medication Sig Dispense Refill  . glucose blood (ONETOUCH VERIO) test strip Use to check BG up to bid. 100 each 5  . Insulin Degludec (TRESIBA FLEXTOUCH) 100 UNIT/ML SOPN Inject 10 Units into the skin daily. 3 mL 0  . Insulin Pen Needle (PEN NEEDLES) 32G X 4 MM MISC 1 each by Does not apply route daily. Use with insulin pen to administer once daily 100 each 0  . metFORMIN (GLUCOPHAGE XR) 500 MG 24 hr tablet Take 1 tablet (500 mg total) by mouth daily with breakfast. 90 tablet 1  . ONETOUCH DELICA LANCETS  FINE MISC Use to check BG up to bid 100 each 5  . sitaGLIPtin (JANUVIA) 100 MG tablet Take 1 tablet (100 mg total) by mouth daily. 28 tablet 0  . warfarin (COUMADIN) 5 MG tablet Take 1 tablet by mouth daily or as directed by coumadin clinic (Patient taking differently: Take 2.5-5 mg by mouth at bedtime. Take 2.5mg   On Fridays only) 90 tablet 1   No current facility-administered medications for this visit.    Allergies: Allergies  Allergen Reactions  . Glipizide Nausea And Vomiting and Other (See Comments)    GI upset  . Metoprolol Other (See Comments)    "Made  me sick"  . Shellfish Allergy Swelling    Throat and eyes were swollen.  Had difficulty breathing.  Was hospitalized.      Social History: The patient  reports that she has never smoked. She has never used smokeless tobacco. She reports that she does not drink alcohol or use illicit drugs.   Family History: The patient's family history includes Brain cancer in her brother; Cancer in her maternal grandmother; Diabetes in her mother.   Review of Systems: Please see the history of present illness.   All other systems are reviewed and negative.   Physical Exam: VS:  BP 124/74 mmHg  Pulse 98  Ht 5\' 7"  (1.702 m)  Wt 100.653 kg (221 lb 14.4 oz)  BMI 34.75 kg/m2 .  BMI Body mass index is 34.75 kg/(m^2).  Wt Readings from Last 3 Encounters:  12/06/14 100.653 kg (221 lb 14.4 oz)  11/28/14 101.606 kg (224 lb)  11/28/14 101.606 kg (224 lb)    General: Pleasant. Well developed, well nourished and in no acute distress.  HEENT: Normal. Neck: Supple, no JVD, carotid bruits, or masses noted.  Cardiac: Regular rate and rhythm. No murmurs, rubs, or gallops. Her valve is crisp. Sternum a looks ok. No edema.  Respiratory:  Lungs are clear to auscultation bilaterally with normal work of breathing.  GI: Soft and nontender.  MS: No deformity or atrophy. Gait and ROM intact. Skin: Warm and dry. Color is normal.  Neuro:  Strength and sensation are intact and no gross focal deficits noted.  Psych: Alert, appropriate and with normal affect.   LABORATORY DATA:  EKG:  EKG is not ordered today.    Lab Results  Component Value Date   WBC 7.8 11/28/2014   HGB 8.6* 11/28/2014   HCT 30.7* 11/28/2014   PLT 164 11/28/2014   GLUCOSE 403* 11/28/2014   CHOL 174 07/11/2014   TRIG 260* 07/11/2014   HDL 22* 07/11/2014   LDLCALC 100* 07/11/2014   ALT 68* 07/11/2014   AST 77* 07/11/2014   NA 132* 11/28/2014   K 3.7 11/28/2014   CL 99* 11/28/2014   CREATININE 0.63 11/28/2014   BUN 10 11/28/2014   CO2  25 11/28/2014   INR 2.9 11/28/2014   HGBA1C 10.2 11/28/2014    BNP (last 3 results)  Recent Labs  03/26/14 1050  BNP 272.0*    ProBNP (last 3 results) No results for input(s): PROBNP in the last 8760 hours.   Other Studies Reviewed Today:   Echo: 03/26/2014 Study Conclusions - Left ventricle: The cavity size was normal. Wall thickness was normal. Systolic function was vigorous. The estimated ejection fraction was in the range of 65% to 70%. Wall motion was normal; there were no regional wall motion abnormalities. - Aortic valve: A [employer] mechanical prosthesis was present. No obvious perivalvular leak based  on limited views. There was no significant regurgitation. Mean gradient (S): 15 mm Hg. Peak gradient (S): 28 mm Hg. - Right ventricle: The cavity size was below normal. - Tricuspid valve: There was trivial regurgitation. - Pulmonary arteries: Systolic pressure could not be accurately estimated. - Pericardium, extracardiac: A large pericardial effusion was identified circumferential to the heart, measures approximately 4 cm posteriorly and 3 cm anteriorly. There is compression of the right ventricle noted in the subcostal view and further evidence of tamponade physiology based on respiratory variation in mitral inflow. There was a right pleural effusion. Impressions: - Normal LV wall thickness with LVEF 65-70%. [employer] mechanical AVR without obvious perivalvular leak based on limited and with grossly normal gradients. A large pericardial effusion was identified circumferential to the heart, measures approximately 4 cm posteriorly and 3-4 cm anteriorly. There is compression of the right ventricle noted in the subcostal view and further evidence of tamponade physiology based on respiratory variation in mitral inflow. Right pleural effusion also noted. Results discussed with Dr. Servando Snare.  Assessment/Plan: 1. Post  pericardectomy syndrome- s/p window and chest tube placement - now resolved.   2. S/p AVR. Needs long term coumadin. Reviewed recommendations for SBE prophylaxis.   3. DM- now on metformin and januvia.   4. Chronic coumadin therapy. INR monitored by primary care.   Current medicines are reviewed with the patient today.  The patient does not have concerns regarding medicines other than what has been noted above.  The following changes have been made:  See above.  Labs/ tests ordered today include:    No orders of the defined types were placed in this encounter.     Disposition:   FU with me in one year.  Signed: Peter Martinique MD, [facility]    12/06/2014 9:40 AM

## 2014-12-15 ENCOUNTER — Ambulatory Visit: Payer: Self-pay | Admitting: Pharmacist

## 2014-12-23 ENCOUNTER — Ambulatory Visit: Payer: Self-pay | Admitting: Pharmacist

## 2015-02-20 ENCOUNTER — Telehealth: Payer: Self-pay | Admitting: Nurse Practitioner

## 2015-03-09 ENCOUNTER — Ambulatory Visit (INDEPENDENT_AMBULATORY_CARE_PROVIDER_SITE_OTHER): Payer: BLUE CROSS/BLUE SHIELD | Admitting: Family

## 2015-03-09 ENCOUNTER — Encounter: Payer: Self-pay | Admitting: Family

## 2015-03-09 VITALS — BP 112/72 | HR 77 | Temp 96.8°F | Ht 67.0 in | Wt 220.0 lb

## 2015-03-09 DIAGNOSIS — J069 Acute upper respiratory infection, unspecified: Secondary | ICD-10-CM

## 2015-03-09 DIAGNOSIS — J309 Allergic rhinitis, unspecified: Secondary | ICD-10-CM | POA: Diagnosis not present

## 2015-03-09 DIAGNOSIS — N3000 Acute cystitis without hematuria: Secondary | ICD-10-CM | POA: Diagnosis not present

## 2015-03-09 DIAGNOSIS — E119 Type 2 diabetes mellitus without complications: Secondary | ICD-10-CM | POA: Diagnosis not present

## 2015-03-09 DIAGNOSIS — R350 Frequency of micturition: Secondary | ICD-10-CM

## 2015-03-09 DIAGNOSIS — R6889 Other general symptoms and signs: Secondary | ICD-10-CM

## 2015-03-09 LAB — POCT URINALYSIS DIPSTICK
BILIRUBIN UA: NEGATIVE
NITRITE UA: NEGATIVE
Protein, UA: NEGATIVE
RBC UA: NEGATIVE
Spec Grav, UA: 1.025
Urobilinogen, UA: NEGATIVE
pH, UA: 5

## 2015-03-09 LAB — POCT UA - MICROSCOPIC ONLY
CASTS, UR, LPF, POC: NEGATIVE
Crystals, Ur, HPF, POC: NEGATIVE
MUCUS UA: NEGATIVE
Yeast, UA: NEGATIVE

## 2015-03-09 LAB — POCT INFLUENZA A/B
Influenza A, POC: NEGATIVE
Influenza B, POC: NEGATIVE

## 2015-03-09 LAB — POCT GLYCOSYLATED HEMOGLOBIN (HGB A1C): HEMOGLOBIN A1C: 9.5

## 2015-03-09 LAB — POCT RAPID STREP A (OFFICE): RAPID STREP A SCREEN: NEGATIVE

## 2015-03-09 MED ORDER — INSULIN DEGLUDEC 100 UNIT/ML ~~LOC~~ SOPN: 10.0000 [IU] | PEN_INJECTOR | Freq: Every day | SUBCUTANEOUS | Status: DC

## 2015-03-09 MED ORDER — METFORMIN HCL 1000 MG PO TABS: 1000.0000 mg | ORAL_TABLET | Freq: Two times a day (BID) | ORAL | Status: DC

## 2015-03-09 MED ORDER — FLUTICASONE PROPIONATE 50 MCG/ACT NA SUSP: 2.0000 | Freq: Every day | NASAL | Status: DC

## 2015-03-09 MED ORDER — SULFAMETHOXAZOLE-TRIMETHOPRIM 800-160 MG PO TABS: 1.0000 | ORAL_TABLET | Freq: Two times a day (BID) | ORAL | Status: DC

## 2015-03-09 NOTE — Patient Instructions (Signed)

## 2015-03-09 NOTE — Progress Notes (Signed)
Subjective:    Patient ID: Anna Salinas, female    DOB: 25-Jul-1970, 45 y.o.   MRN: [id number]  Cough This is a new problem. The current episode started in the past 7 days. The problem has been unchanged. The problem occurs every few minutes. The cough is non-productive. Associated symptoms include a fever, headaches, nasal congestion, postnasal drip, rhinorrhea and shortness of breath. Pertinent negatives include no chills, ear congestion, ear pain, myalgias or wheezing. The symptoms are aggravated by lying down. She has tried OTC cough suppressant for the symptoms. The treatment provided mild relief. There is no history of asthma or COPD.  Urinary Frequency  This is a new problem. The current episode started 1 to 4 weeks ago. The problem occurs intermittently. The problem has been unchanged. The quality of the pain is described as aching. The pain is at a severity of 3/10. The pain is mild. There has been no fever. Associated symptoms include frequency and urgency. Pertinent negatives include no chills, discharge, flank pain, hematuria, nausea or vomiting. She has tried increased fluids for the symptoms. The treatment provided mild relief.  Diabetes She has type 2 diabetes mellitus. Her disease course has been worsening. Hypoglycemia symptoms include headaches. Pertinent negatives for diabetes include no blurred vision. Hypoglycemia complications include blackouts. Pertinent negatives for hypoglycemia complications include no hospitalization. Symptoms are worsening. Pertinent negatives for diabetic complications include no CVA, heart disease, nephropathy or peripheral neuropathy. Risk factors for coronary artery disease include diabetes mellitus. Current diabetic treatment includes oral agent (monotherapy). She is compliant with treatment most of the time. She is following a generally unhealthy diet. Her breakfast blood glucose range is generally >200 mg/dl. Eye exam is not current.      Review  of Systems  Constitutional: Positive for fever. Negative for chills.  HENT: Positive for postnasal drip and rhinorrhea. Negative for ear pain.   Eyes: Negative.  Negative for blurred vision.  Respiratory: Positive for cough and shortness of breath. Negative for wheezing.   Cardiovascular: Negative.  Negative for palpitations.  Gastrointestinal: Negative.  Negative for nausea and vomiting.  Endocrine: Negative.   Genitourinary: Positive for urgency and frequency. Negative for hematuria and flank pain.  Musculoskeletal: Negative.  Negative for myalgias.  Neurological: Positive for headaches.  Hematological: Negative.   Psychiatric/Behavioral: Negative.   All other systems reviewed and are negative.      Objective:   Physical Exam  Constitutional: She is oriented to person, place, and time. She appears well-developed and well-nourished. No distress.  HENT:  Head: Normocephalic and atraumatic.  Right Ear: External ear normal.  Left Ear: External ear normal.  Mouth/Throat: Oropharynx is clear and moist.  Nasal passage erythemas with mild swelling    Eyes: Pupils are equal, round, and reactive to light.  Neck: Normal range of motion. Neck supple. No thyromegaly present.  Cardiovascular: Normal rate, regular rhythm, normal heart sounds and intact distal pulses.   No murmur heard. Pulmonary/Chest: Effort normal and breath sounds normal. No respiratory distress. She has no wheezes.  Abdominal: Soft. Bowel sounds are normal. She exhibits no distension. There is no tenderness.  Musculoskeletal: Normal range of motion. She exhibits no edema or tenderness.  Neurological: She is alert and oriented to person, place, and time. She has normal reflexes. No cranial nerve deficit.  Skin: Skin is warm and dry.  Psychiatric: She has a normal mood and affect. Her behavior is normal. Judgment and thought content normal.  Vitals reviewed.   BP 112/72  mmHg  Pulse 77  Temp(Src) 96.8 F (36 C) (Oral)   Ht '5\' 7"'$  (1.702 m)  Wt 220 lb (99.791 kg)  BMI 34.45 kg/m2       Assessment & Plan:  1. Flu-like symptoms - POCT Influenza A/B - POCT rapid strep A - CMP14+EGFR  2. Urine frequency - POCT UA - Microscopic Only - POCT urinalysis dipstick - CMP14+EGFR  3. Diabetes mellitus type II, non insulin dependent (HCC) -Pt's metformin increased to 1000 mg BID from 500 mg XL -PT told she needed to go to the ED because of the moderate amt of ketones in urine- Long discussion on importnace of good control of BS- PT states at home her BS have been >400 -Pt restarted on Tresiba 10 units every evening -RTO in 2 weeks - Ambulatory referral to Ophthalmology - metFORMIN (GLUCOPHAGE) 1000 MG tablet; Take 1 tablet (1,000 mg total) by mouth 2 (two) times daily with a meal.  Dispense: 180 tablet; Refill: 3 - Insulin Degludec (TRESIBA FLEXTOUCH) 100 UNIT/ML SOPN; Inject 10 Units into the skin daily.  Dispense: 3 mL; Refill: 3 - POCT glycosylated hemoglobin (Hb A1C) - CMP14+EGFR  4. Acute cystitis without hematuria -Force fluids AZO over the counter X2 days RTO prn Culture pending - CMP14+EGFR - sulfamethoxazole-trimethoprim (BACTRIM DS) 800-160 MG tablet; Take 1 tablet by mouth 2 (two) times daily.  Dispense: 14 tablet; Refill: 0  5. Allergic rhinitis, unspecified allergic rhinitis type - CMP14+EGFR - fluticasone (FLONASE) 50 MCG/ACT nasal spray; Place 2 sprays into both nostrils daily.  Dispense: 16 g; Refill: 6  6. Acute upper respiratory infection -- Take meds as prescribed - Use a cool mist humidifier  -Use saline nose sprays frequently -Saline irrigations of the nose can be very helpful if done frequently.  * 4X daily for 1 week*  * Use of a nettie pot can be helpful with this. Follow directions with this* -Force fluids -For any cough or congestion  Use plain Mucinex- regular strength or max strength is fine   * Children- consult with Pharmacist for dosing -For fever or aces or  pains- take tylenol or ibuprofen appropriate for age and weight.  * for fevers greater than 101 orally you may alternate ibuprofen and tylenol every  3 hours. -Throat lozenges if help - CMP14+EGFR - fluticasone (FLONASE) 50 MCG/ACT nasal spray; Place 2 sprays into both nostrils daily.  Dispense: 16 g; Refill: Lindsay, FNP

## 2015-03-09 NOTE — Addendum Note (Signed)
Addended by: Pollyann Kennedy F on: 03/09/2015 11:27 AM   Modules accepted: Orders, SmartSet

## 2015-03-10 LAB — CMP14+EGFR
A/G RATIO: 1.3 (ref 1.1–2.5)
ALK PHOS: 90 IU/L (ref 39–117)
ALT: 80 IU/L — AB (ref 0–32)
AST: 93 IU/L — AB (ref 0–40)
Albumin: 4 g/dL (ref 3.5–5.5)
BILIRUBIN TOTAL: 0.5 mg/dL (ref 0.0–1.2)
BUN/Creatinine Ratio: 14 (ref 9–23)
BUN: 9 mg/dL (ref 6–24)
CHLORIDE: 97 mmol/L (ref 96–106)
CO2: 21 mmol/L (ref 18–29)
Calcium: 8.9 mg/dL (ref 8.7–10.2)
Creatinine, Ser: 0.64 mg/dL (ref 0.57–1.00)
GFR calc Af Amer: 127 mL/min/{1.73_m2} (ref >59)
GFR calc non Af Amer: 110 mL/min/{1.73_m2} (ref >59)
GLUCOSE: 279 mg/dL — AB (ref 65–99)
Globulin, Total: 3 g/dL (ref 1.5–4.5)
POTASSIUM: 4 mmol/L (ref 3.5–5.2)
Sodium: 134 mmol/L (ref 134–144)
TOTAL PROTEIN: 7 g/dL (ref 6.0–8.5)

## 2015-03-10 LAB — URINE CULTURE

## 2015-03-14 LAB — HM DIABETES EYE EXAM

## 2015-03-31 ENCOUNTER — Encounter: Payer: Self-pay | Admitting: Nurse Practitioner

## 2015-03-31 ENCOUNTER — Ambulatory Visit (INDEPENDENT_AMBULATORY_CARE_PROVIDER_SITE_OTHER): Payer: BLUE CROSS/BLUE SHIELD | Admitting: Nurse Practitioner

## 2015-03-31 ENCOUNTER — Ambulatory Visit: Payer: BLUE CROSS/BLUE SHIELD | Admitting: Pharmacist

## 2015-03-31 VITALS — BP 117/71 | HR 72 | Temp 97.7°F | Ht 67.0 in | Wt 224.0 lb

## 2015-03-31 DIAGNOSIS — E119 Type 2 diabetes mellitus without complications: Secondary | ICD-10-CM

## 2015-03-31 DIAGNOSIS — I10 Essential (primary) hypertension: Secondary | ICD-10-CM

## 2015-03-31 DIAGNOSIS — Z952 Presence of prosthetic heart valve: Secondary | ICD-10-CM

## 2015-03-31 DIAGNOSIS — Z0000 Encounter for general adult medical examination without abnormal findings: Secondary | ICD-10-CM | POA: Diagnosis not present

## 2015-03-31 DIAGNOSIS — Z6835 Body mass index (BMI) 35.0-35.9, adult: Secondary | ICD-10-CM

## 2015-03-31 DIAGNOSIS — Z7901 Long term (current) use of anticoagulants: Secondary | ICD-10-CM

## 2015-03-31 DIAGNOSIS — Z01419 Encounter for gynecological examination (general) (routine) without abnormal findings: Secondary | ICD-10-CM

## 2015-03-31 DIAGNOSIS — R011 Cardiac murmur, unspecified: Secondary | ICD-10-CM

## 2015-03-31 DIAGNOSIS — E669 Obesity, unspecified: Secondary | ICD-10-CM

## 2015-03-31 LAB — URINALYSIS, COMPLETE
Bilirubin, UA: NEGATIVE
KETONES UA: NEGATIVE
Leukocytes, UA: NEGATIVE
NITRITE UA: NEGATIVE
PH UA: 5.5 (ref 5.0–7.5)
PROTEIN UA: NEGATIVE
RBC UA: NEGATIVE
Specific Gravity, UA: <1.005 — ABNORMAL LOW (ref 1.005–1.030)
UUROB: 0.2 mg/dL (ref 0.2–1.0)

## 2015-03-31 LAB — MICROSCOPIC EXAMINATION
BACTERIA UA: NONE SEEN
Renal Epithel, UA: NONE SEEN /hpf

## 2015-03-31 LAB — COAGUCHEK XS/INR WAIVED
INR: 3.3 — ABNORMAL HIGH (ref 0.9–1.1)
Prothrombin Time: 39.4 s

## 2015-03-31 LAB — PROTIME-INR: INR: 3.3 — AB (ref <=1.1)

## 2015-03-31 MED ORDER — METFORMIN HCL 1000 MG PO TABS: 1000.0000 mg | ORAL_TABLET | Freq: Two times a day (BID) | ORAL | Status: DC

## 2015-03-31 MED ORDER — WARFARIN SODIUM 5 MG PO TABS: ORAL_TABLET | ORAL | Status: DC

## 2015-03-31 NOTE — Patient Instructions (Signed)
Anticoagulation Dose Instructions as of 03/31/2015      Dorene Grebe Tue Wed Thu Fri Sat   New Dose 5 mg 2.5 mg 5 mg 5 mg 5 mg 2.5 mg 5 mg    Description        Hold coumadin today and tomorrow- then 5mg  daily except 2.5 mg on Monday and friday     Recheck in 2 weeks

## 2015-03-31 NOTE — Progress Notes (Signed)
Patient was scheduled for Protime with me and PAP is MMM

## 2015-03-31 NOTE — Progress Notes (Signed)
Subjective:    Patient ID: Anna Salinas, female    DOB: Dec 19, 1970, 45 y.o.   MRN: [id number]     Patient here today for annual physical exam, PAP and follow up of chronic medical problems. SHe is doing well today without complaints.  Outpatient Encounter Prescriptions as of 03/31/2015  Medication Sig  . fluticasone (FLONASE) 50 MCG/ACT nasal spray Place 2 sprays into both nostrils daily.  Marland Kitchen glucose blood (ONETOUCH VERIO) test strip Use to check BG up to bid.  . Insulin Degludec (TRESIBA FLEXTOUCH) 100 UNIT/ML SOPN Inject 10 Units into the skin daily.  . Insulin Pen Needle (PEN NEEDLES) 32G X 4 MM MISC 1 each by Does not apply route daily. Use with insulin pen to administer once daily  . metFORMIN (GLUCOPHAGE) 1000 MG tablet Take 1 tablet (1,000 mg total) by mouth 2 (two) times daily with a meal.  . ONETOUCH DELICA LANCETS FINE MISC Use to check BG up to bid  . warfarin (COUMADIN) 5 MG tablet Take 1 tablet by mouth daily or as directed by coumadin clinic (Patient taking differently: Take 2.5-5 mg by mouth at bedtime. Take 2.'5mg'$   On Fridays only)         Hypertension This is a chronic problem. The current episode started more than 1 year ago. The problem is unchanged. The problem is controlled. Pertinent negatives include no anxiety, blurred vision, chest pain, headaches, palpitations, peripheral edema or shortness of breath. Risk factors for coronary artery disease include dyslipidemia, obesity and sedentary lifestyle. Past treatments include diuretics. The current treatment provides mild improvement. There is no history of CAD/MI or heart failure.  Diabetes She presents for her follow-up diabetic visit. She has type 2 diabetes mellitus. No MedicAlert identification noted. Her disease course has been stable. Pertinent negatives for hypoglycemia include no headaches. Pertinent negatives for diabetes include no blurred vision, no chest pain, no foot ulcerations, no polydipsia, no polyphagia  and no polyuria. When asked about current treatments, none were reported. She has not had a previous visit with a dietitian. Home blood sugar record trend: does not check blood sugars at home. An ACE inhibitor/angiotensin II receptor blocker is not being taken. She does not see a podiatrist.Eye exam is not current.  hypokalemia No longer on potassium supplements daily- cardiologist said to only take as needed- no c/ o lower ext cramping. AVR- long term coumadin treatment See anticoag note     Review of Systems  Constitutional: Negative.   HENT: Negative.   Eyes: Negative for blurred vision.  Respiratory: Negative for shortness of breath.   Cardiovascular: Negative for chest pain and palpitations.  Endocrine: Negative for polydipsia, polyphagia and polyuria.  Neurological: Negative for headaches.  Psychiatric/Behavioral: Negative.   All other systems reviewed and are negative.      Objective:   Physical Exam  Constitutional: She is oriented to person, place, and time. She appears well-developed and well-nourished.  HENT:  Head: Normocephalic.  Right Ear: Hearing, tympanic membrane, external ear and ear canal normal.  Left Ear: Hearing, tympanic membrane, external ear and ear canal normal.  Nose: Nose normal.  Mouth/Throat: Uvula is midline and oropharynx is clear and moist.  Eyes: Conjunctivae and EOM are normal. Pupils are equal, round, and reactive to light.  Neck: Trachea normal, normal range of motion and full passive range of motion without pain. Neck supple. No JVD present. Carotid bruit is not present. No thyroid mass and no thyromegaly present.  Cardiovascular: Normal rate, regular rhythm,  normal heart sounds and intact distal pulses.  Exam reveals no gallop and no friction rub.   No murmur heard. Click of aortic valve audible  Pulmonary/Chest: Effort normal and breath sounds normal. Right breast exhibits no inverted nipple, no mass, no nipple discharge, no skin change and  no tenderness. Left breast exhibits no inverted nipple, no mass, no nipple discharge, no skin change and no tenderness.  Abdominal: Soft. Bowel sounds are normal. She exhibits no distension and no mass. There is no tenderness.  Genitourinary: Vagina normal and uterus normal. No breast swelling, tenderness, discharge or bleeding.  bimanual exam-No adnexal masses or tenderness. Cervix parous and pink- no vaginal discharge  Musculoskeletal: Normal range of motion.  Lymphadenopathy:    She has no cervical adenopathy.  Neurological: She is alert and oriented to person, place, and time. She has normal reflexes.  Skin: Skin is warm and dry.  Psychiatric: She has a normal mood and affect. Her behavior is normal. Judgment and thought content normal.    BP 117/71 mmHg  Pulse 72  Temp(Src) 97.7 F (36.5 C) (Oral)  Ht '5\' 7"'$  (1.702 m)  Wt 224 lb (101.606 kg)  BMI 35.08 kg/m2        Assessment & Plan:  1. Annual physical exam - Urinalysis, Complete - CBC with Differential/Platelet - Thyroid Panel With TSH  2. Encounter for routine gynecological examination - Pap IG w/ reflex to HPV when ASC-U  3. S/P AVR (aortic valve replacement) See INR report - CoaguChek XS/INR Waived  4. Long-term (current) use of anticoagulants - CoaguChek XS/INR Waived - warfarin (COUMADIN) 5 MG tablet; Take 1 tablet by mouth daily or as directed by coumadin clinic  Dispense: 90 tablet; Refill: 1  5. Essential hypertension, benign Do not add salt to diet - CMP14+EGFR - Lipid panel  6. Diabetes mellitus type II, non insulin dependent ([place]) Continue to watch carbs in diet Will recheck HGBA1C in 3 months - metFORMIN (GLUCOPHAGE) 1000 MG tablet; Take 1 tablet (1,000 mg total) by mouth 2 (two) times daily with a meal.  Dispense: 180 tablet; Refill: 3  7. Murmur  8. Obesity Discussed diet and exercise for person with BMI >25 Will recheck weight in 3-6 months  9. Aortic valve replaced  10. BMI  35.0-35.9,adult     Labs pending Health maintenance reviewed Diet and exercise encouraged Continue all meds Follow up  In 3 month   [place], FNP

## 2015-04-01 LAB — CBC WITH DIFFERENTIAL/PLATELET
BASOS: 0 %
Basophils Absolute: 0 10*3/uL (ref 0.0–0.2)
EOS (ABSOLUTE): 0.2 10*3/uL (ref 0.0–0.4)
Eos: 3 %
HEMOGLOBIN: 10.8 g/dL — AB (ref 11.1–15.9)
Hematocrit: 37.2 % (ref 34.0–46.6)
IMMATURE GRANS (ABS): 0 10*3/uL (ref 0.0–0.1)
Immature Granulocytes: 0 %
LYMPHS ABS: 1.9 10*3/uL (ref 0.7–3.1)
LYMPHS: 25 %
MCH: 22.2 pg — AB (ref 26.6–33.0)
MCHC: 29 g/dL — AB (ref 31.5–35.7)
MCV: 77 fL — AB (ref 79–97)
MONOCYTES: 8 %
Monocytes Absolute: 0.6 10*3/uL (ref 0.1–0.9)
Neutrophils Absolute: 5 10*3/uL (ref 1.4–7.0)
Neutrophils: 64 %
Platelets: 201 10*3/uL (ref 150–379)
RBC: 4.86 x10E6/uL (ref 3.77–5.28)
RDW: 19.2 % — ABNORMAL HIGH (ref 12.3–15.4)
WBC: 7.8 10*3/uL (ref 3.4–10.8)

## 2015-04-01 LAB — CMP14+EGFR
ALK PHOS: 79 IU/L (ref 39–117)
ALT: 66 IU/L — AB (ref 0–32)
AST: 80 IU/L — AB (ref 0–40)
Albumin/Globulin Ratio: 1.4 (ref 1.2–2.2)
Albumin: 4 g/dL (ref 3.5–5.5)
BUN/Creatinine Ratio: 16 (ref 9–23)
BUN: 10 mg/dL (ref 6–24)
Bilirubin Total: 0.4 mg/dL (ref 0.0–1.2)
CALCIUM: 9.3 mg/dL (ref 8.7–10.2)
CO2: 24 mmol/L (ref 18–29)
CREATININE: 0.63 mg/dL (ref 0.57–1.00)
Chloride: 95 mmol/L — ABNORMAL LOW (ref 96–106)
GFR calc Af Amer: 128 mL/min/{1.73_m2} (ref >59)
GFR, EST NON AFRICAN AMERICAN: 111 mL/min/{1.73_m2} (ref >59)
Globulin, Total: 2.8 g/dL (ref 1.5–4.5)
Glucose: 248 mg/dL — ABNORMAL HIGH (ref 65–99)
Potassium: 3.6 mmol/L (ref 3.5–5.2)
SODIUM: 135 mmol/L (ref 134–144)
Total Protein: 6.8 g/dL (ref 6.0–8.5)

## 2015-04-01 LAB — LIPID PANEL
CHOLESTEROL TOTAL: 167 mg/dL (ref 100–199)
Chol/HDL Ratio: 5.8 ratio units — ABNORMAL HIGH (ref 0.0–4.4)
HDL: 29 mg/dL — AB (ref >39)
LDL CALC: 109 mg/dL — AB (ref 0–99)
TRIGLYCERIDES: 146 mg/dL (ref 0–149)
VLDL Cholesterol Cal: 29 mg/dL (ref 5–40)

## 2015-04-01 LAB — THYROID PANEL WITH TSH
FREE THYROXINE INDEX: 2.3 (ref 1.2–4.9)
T3 Uptake Ratio: 21 % — ABNORMAL LOW (ref 24–39)
T4, Total: 10.9 ug/dL (ref 4.5–12.0)
TSH: 1.22 u[IU]/mL (ref 0.450–4.500)

## 2015-04-03 NOTE — Progress Notes (Signed)
Patient aware. Will get otc iron and rck in one month.

## 2015-04-03 NOTE — Addendum Note (Signed)
Addended by: Thana Ates on: 04/03/2015 03:10 PM   Modules accepted: Orders

## 2015-04-03 NOTE — Addendum Note (Signed)
Addended by: Liliane Bade on: 04/03/2015 02:51 PM   Modules accepted: Orders

## 2015-04-03 NOTE — Addendum Note (Signed)
Addended by: Thana Ates on: 04/03/2015 02:35 PM   Modules accepted: Orders

## 2015-04-05 LAB — PAP IG W/ RFLX HPV ASCU: PAP SMEAR COMMENT: 0

## 2015-04-05 LAB — HPV DNA PROBE HIGH RISK, AMPLIFIED: HPV, HIGH-RISK: NEGATIVE

## 2015-04-05 LAB — HGB A1C W/O EAG: Hgb A1c MFr Bld: 10.4 % — ABNORMAL HIGH (ref 4.8–5.6)

## 2015-04-05 LAB — SPECIMEN STATUS REPORT

## 2015-05-03 ENCOUNTER — Other Ambulatory Visit: Payer: BLUE CROSS/BLUE SHIELD

## 2015-05-03 DIAGNOSIS — D649 Anemia, unspecified: Secondary | ICD-10-CM | POA: Diagnosis not present

## 2015-05-03 LAB — CBC WITH DIFFERENTIAL/PLATELET
BASOS: 0 %
Basophils Absolute: 0 10*3/uL (ref 0.0–0.2)
EOS (ABSOLUTE): 0.2 10*3/uL (ref 0.0–0.4)
EOS: 2 %
HEMATOCRIT: 36.7 % (ref 34.0–46.6)
Hemoglobin: 11.1 g/dL (ref 11.1–15.9)
IMMATURE GRANS (ABS): 0 10*3/uL (ref 0.0–0.1)
IMMATURE GRANULOCYTES: 0 %
LYMPHS: 27 %
Lymphocytes Absolute: 1.8 10*3/uL (ref 0.7–3.1)
MCH: 23.3 pg — ABNORMAL LOW (ref 26.6–33.0)
MCHC: 30.2 g/dL — ABNORMAL LOW (ref 31.5–35.7)
MCV: 77 fL — AB (ref 79–97)
Monocytes Absolute: 0.4 10*3/uL (ref 0.1–0.9)
Monocytes: 6 %
Neutrophils Absolute: 4.3 10*3/uL (ref 1.4–7.0)
Neutrophils: 65 %
PLATELETS: 174 10*3/uL (ref 150–379)
RBC: 4.76 x10E6/uL (ref 3.77–5.28)
RDW: 18.4 % — ABNORMAL HIGH (ref 12.3–15.4)
WBC: 6.7 10*3/uL (ref 3.4–10.8)

## 2015-05-04 ENCOUNTER — Ambulatory Visit (INDEPENDENT_AMBULATORY_CARE_PROVIDER_SITE_OTHER): Payer: BLUE CROSS/BLUE SHIELD | Admitting: Pharmacist

## 2015-05-04 ENCOUNTER — Encounter: Payer: Self-pay | Admitting: Pharmacist

## 2015-05-04 DIAGNOSIS — Z7901 Long term (current) use of anticoagulants: Secondary | ICD-10-CM | POA: Diagnosis not present

## 2015-05-04 DIAGNOSIS — Z954 Presence of other heart-valve replacement: Secondary | ICD-10-CM

## 2015-05-04 DIAGNOSIS — Z952 Presence of prosthetic heart valve: Secondary | ICD-10-CM

## 2015-05-04 LAB — COAGUCHEK XS/INR WAIVED
INR: 4.1 — AB (ref 0.9–1.1)
PROTHROMBIN TIME: 49.2 s

## 2015-05-04 NOTE — Patient Instructions (Signed)
Anticoagulation Dose Instructions as of 05/04/2015      Anna Salinas Tue Wed Thu Fri Sat   New Dose 5 mg 2.5 mg 5 mg 2.5 mg 5 mg 2.5 mg 5 mg    Description        No warfarin today or tomorrow.  Then start warfarin 5mg  - take 1/2 tablet mondays, wednesdays and fridays.  Take 1 tablet all other days.      INR was 4.1 today

## 2015-05-12 ENCOUNTER — Encounter: Payer: Self-pay | Admitting: Pharmacist

## 2015-05-15 ENCOUNTER — Encounter: Payer: Self-pay | Admitting: Nurse Practitioner

## 2015-05-29 ENCOUNTER — Encounter (HOSPITAL_COMMUNITY): Payer: Self-pay | Admitting: *Deleted

## 2015-05-29 ENCOUNTER — Telehealth: Payer: Self-pay | Admitting: Cardiology

## 2015-05-29 ENCOUNTER — Emergency Department (HOSPITAL_COMMUNITY): Payer: BLUE CROSS/BLUE SHIELD

## 2015-05-29 ENCOUNTER — Emergency Department (HOSPITAL_COMMUNITY)
Admission: EM | Admit: 2015-05-29 | Discharge: 2015-05-29 | Disposition: A | Discharge status: Home / Self Care | Payer: BLUE CROSS/BLUE SHIELD | Attending: Emergency Medicine | Admitting: Emergency Medicine

## 2015-05-29 DIAGNOSIS — K219 Gastro-esophageal reflux disease without esophagitis: Secondary | ICD-10-CM | POA: Diagnosis not present

## 2015-05-29 DIAGNOSIS — Z7951 Long term (current) use of inhaled steroids: Secondary | ICD-10-CM | POA: Diagnosis not present

## 2015-05-29 DIAGNOSIS — R011 Cardiac murmur, unspecified: Secondary | ICD-10-CM | POA: Insufficient documentation

## 2015-05-29 DIAGNOSIS — R079 Chest pain, unspecified: Secondary | ICD-10-CM | POA: Insufficient documentation

## 2015-05-29 DIAGNOSIS — E119 Type 2 diabetes mellitus without complications: Secondary | ICD-10-CM | POA: Insufficient documentation

## 2015-05-29 DIAGNOSIS — R0789 Other chest pain: Secondary | ICD-10-CM

## 2015-05-29 DIAGNOSIS — Z7901 Long term (current) use of anticoagulants: Secondary | ICD-10-CM | POA: Insufficient documentation

## 2015-05-29 DIAGNOSIS — Z794 Long term (current) use of insulin: Secondary | ICD-10-CM | POA: Diagnosis not present

## 2015-05-29 DIAGNOSIS — I10 Essential (primary) hypertension: Secondary | ICD-10-CM | POA: Insufficient documentation

## 2015-05-29 DIAGNOSIS — Z7984 Long term (current) use of oral hypoglycemic drugs: Secondary | ICD-10-CM | POA: Insufficient documentation

## 2015-05-29 DIAGNOSIS — R739 Hyperglycemia, unspecified: Secondary | ICD-10-CM | POA: Diagnosis not present

## 2015-05-29 DIAGNOSIS — R0602 Shortness of breath: Secondary | ICD-10-CM | POA: Diagnosis not present

## 2015-05-29 DIAGNOSIS — R0682 Tachypnea, not elsewhere classified: Secondary | ICD-10-CM | POA: Diagnosis not present

## 2015-05-29 DIAGNOSIS — Z9889 Other specified postprocedural states: Secondary | ICD-10-CM | POA: Insufficient documentation

## 2015-05-29 LAB — BASIC METABOLIC PANEL
ANION GAP: 11 (ref 5–15)
BUN: 8 mg/dL (ref 6–20)
CHLORIDE: 100 mmol/L — AB (ref 101–111)
CO2: 21 mmol/L — AB (ref 22–32)
Calcium: 9 mg/dL (ref 8.9–10.3)
Creatinine, Ser: 0.7 mg/dL (ref 0.44–1.00)
GFR calc non Af Amer: >60 mL/min (ref >60)
GLUCOSE: 314 mg/dL — AB (ref 65–99)
POTASSIUM: 3.6 mmol/L (ref 3.5–5.1)
Sodium: 132 mmol/L — ABNORMAL LOW (ref 135–145)

## 2015-05-29 LAB — I-STAT TROPONIN, ED
TROPONIN I, POC: 0.01 ng/mL (ref 0.00–0.08)
Troponin i, poc: 0 ng/mL (ref 0.00–0.08)

## 2015-05-29 LAB — CBC
HEMATOCRIT: 35.2 % — AB (ref 36.0–46.0)
HEMOGLOBIN: 11.1 g/dL — AB (ref 12.0–15.0)
MCH: 24.4 pg — AB (ref 26.0–34.0)
MCHC: 31.5 g/dL (ref 30.0–36.0)
MCV: 77.5 fL — AB (ref 78.0–100.0)
Platelets: 151 10*3/uL (ref 150–400)
RBC: 4.54 MIL/uL (ref 3.87–5.11)
RDW: 17 % — ABNORMAL HIGH (ref 11.5–15.5)
WBC: 7.2 10*3/uL (ref 4.0–10.5)

## 2015-05-29 LAB — PROTIME-INR
INR: 2.51 — AB (ref 0.00–1.49)
Prothrombin Time: 26.8 seconds — ABNORMAL HIGH (ref 11.6–15.2)

## 2015-05-29 MED ORDER — RANITIDINE HCL 150 MG PO CAPS: 150.0000 mg | ORAL_CAPSULE | Freq: Every day | ORAL | Status: DC

## 2015-05-29 NOTE — ED Notes (Signed)
MD at bedside. 

## 2015-05-29 NOTE — ED Notes (Signed)
Pt presents via [facility] for chest pain. Pt reports pain on and off x 3 days, some radiation to BL arms as well as Nausea and SOB.  Hx: AVR, pleural effusion.  A x 4, NAD.  Pt reports decreased dose in coumadin x 2 weeks d/t INR being 4.4 last check.  BP-140/86 P-92 NSR, R-18, O2-98% RA, CBG-360, pt takes metformin at night.   EKG with elevation in V3 and AVR.

## 2015-05-29 NOTE — ED Notes (Signed)
EKG performed at 1540, not 1539

## 2015-05-29 NOTE — Telephone Encounter (Signed)
Pt already went and being evaluated in the ED.

## 2015-05-29 NOTE — Telephone Encounter (Signed)
Pt c/o of Chest Pain: STAT if CP now or developed within 24 hours  1. Are you having CP right now? no  2. Are you experiencing any other symptoms (ex. SOB, nausea, vomiting, sweating)? Nausea -arm "soreness"  3. How long have you been experiencing CP? Since last night  4. Is your CP continuous or coming and going? Coming and going   5. Have you taken Nitroglycerin? No-does not have rx for it ? Please call at work [phone] ext [phone]

## 2015-05-29 NOTE — ED Notes (Signed)
Called for repeat troponin draw.

## 2015-05-29 NOTE — ED Provider Notes (Signed)
CSN: [id number]     Arrival date & time 05/29/15  1532 History   First MD Initiated Contact with Patient 05/29/15 1544     Chief Complaint  Patient presents with  . Chest Pain     (Consider location/radiation/quality/duration/timing/severity/associated sxs/prior Treatment) Patient is a 45 y.o. female presenting with chest pain. The history is provided by the patient and medical records. No language interpreter was used.  Chest Pain Chest pain location: upper chest. Pain quality: aching   Pain radiates to:  R shoulder Pain radiates to the back: no   Pain severity:  Mild Onset quality:  Gradual Duration:  4 days Timing:  Intermittent Progression:  Waxing and waning Chronicity:  New Context: not lifting, no stress and no trauma   Relieved by:  Nothing Exacerbated by: lying down. Ineffective treatments:  None tried Associated symptoms: nausea and shortness of breath   Associated symptoms: no abdominal pain, no back pain, no cough, no dizziness, no fever, no headache, no palpitations and not vomiting     Past Medical History  Diagnosis Date  . Essential hypertension, benign   . Palpitations   . Aortic stenosis due to bicuspid aortic valve   . Type 2 diabetes mellitus [facility])    Past Surgical History  Procedure Laterality Date  . Cesarean section  2009  . Tubal ligation    . Left and right heart catheterization with coronary angiogram N/A 01/25/2014    Procedure: LEFT AND RIGHT HEART CATHETERIZATION WITH CORONARY ANGIOGRAM;  Surgeon: Peter M Martinique, MD;  Location: [facility] CATH LAB;  Service: Cardiovascular;  Laterality: N/A;  . Cardiac catheterization    . Aortic valve replacement N/A 02/14/2014    Procedure: AORTIC VALVE REPLACEMENT (AVR);  Surgeon: Gaye Pollack, MD;  Location: [place];  Service: Open Heart Surgery;  Laterality: N/A;  . Intraoperative transesophageal echocardiogram N/A 02/14/2014    Procedure: INTRAOPERATIVE TRANSESOPHAGEAL ECHOCARDIOGRAM;  Surgeon: Gaye Pollack, MD;   Location: [facility] OR;  Service: Open Heart Surgery;  Laterality: N/A;  . Subxyphoid pericardial window N/A 03/27/2014    Procedure: SUBXYPHOID PERICARDIAL WINDOW;  Surgeon: Grace Isaac, MD;  Location: [place];  Service: Thoracic;  Laterality: N/A;  . Chest tube insertion Right 03/27/2014    Procedure: CHEST TUBE INSERTION;  Surgeon: Grace Isaac, MD;  Location: [place];  Service: Thoracic;  Laterality: Right;  . Pleural effusion drainage Right 03/27/2014    Procedure: DRAINAGE OF PLEURAL EFFUSION;  Surgeon: Grace Isaac, MD;  Location: [place];  Service: Thoracic;  Laterality: Right;  Drainage of right pleural effusion  . Pericardial fluid drainage N/A 03/27/2014    Procedure: DRAINAGE OF PERICARDIAL FLUID;  Surgeon: Grace Isaac, MD;  Location: [facility] OR;  Service: Thoracic;  Laterality: N/A;   Family History  Problem Relation Age of Onset  . Diabetes Mother   . Brain cancer Brother   . Cancer Maternal Grandmother    Social History  Substance Use Topics  . Smoking status: Never Smoker   . Smokeless tobacco: Never Used  . Alcohol Use: No   OB History    No data available     Review of Systems  Constitutional: Negative for fever and chills.  HENT: Negative for congestion and rhinorrhea.   Eyes: Negative for visual disturbance.  Respiratory: Positive for shortness of breath. Negative for cough.   Cardiovascular: Positive for chest pain. Negative for palpitations.  Gastrointestinal: Positive for nausea. Negative for vomiting and abdominal pain.  Genitourinary: Negative for  difficulty urinating and dyspareunia.  Musculoskeletal: Negative for back pain.  Skin: Negative for pallor and rash.  Neurological: Negative for dizziness and headaches.  Psychiatric/Behavioral: Negative for confusion.      Allergies  Glipizide; Metoprolol; and Shellfish allergy  Home Medications   Prior to Admission medications   Medication Sig Start Date End Date Taking? Authorizing Provider   fluticasone (FLONASE) 50 MCG/ACT nasal spray Place 2 sprays into both nostrils daily. 03/09/15   Sharion Balloon, FNP  glucose blood (ONETOUCH VERIO) test strip Use to check BG up to bid. 11/30/14   Tammy Eckard, PHARMD  Insulin Degludec (TRESIBA FLEXTOUCH) 100 UNIT/ML SOPN Inject 10 Units into the skin daily. 03/09/15   Sharion Balloon, FNP  Insulin Pen Needle (PEN NEEDLES) 32G X 4 MM MISC 1 each by Does not apply route daily. Use with insulin pen to administer once daily 11/28/14   Tammy Eckard, PHARMD  metFORMIN (GLUCOPHAGE) 1000 MG tablet Take 1 tablet (1,000 mg total) by mouth 2 (two) times daily with a meal. 03/31/15   Mary-Margaret Hassell Done, FNP  [facility] DELICA LANCETS FINE MISC Use to check BG up to bid 11/30/14   Tammy Eckard, PHARMD  warfarin (COUMADIN) 5 MG tablet Take 1 tablet by mouth daily or as directed by coumadin clinic 03/31/15   Mary-Margaret Hassell Done, FNP   BP 116/63 mmHg  Pulse 81  Temp(Src) 98.7 F (37.1 C) (Oral)  Resp 20  Ht 5\' 8"  (1.727 m)  Wt 102.059 kg  BMI 34.22 kg/m2  SpO2 100%  LMP 01/29/2015 Physical Exam  Constitutional: She is oriented to person, place, and time. She appears well-developed and well-nourished. No distress.  HENT:  Head: Normocephalic and atraumatic.  Eyes: EOM are normal. Pupils are equal, round, and reactive to light.  Neck: Normal range of motion. Neck supple.  Cardiovascular: Normal rate, regular rhythm and intact distal pulses.   Murmur heard. Pulmonary/Chest: Effort normal and breath sounds normal. No respiratory distress. She has no wheezes. She has no rales. She exhibits no tenderness.  Abdominal: Soft. She exhibits no distension. There is no tenderness.  Musculoskeletal: Normal range of motion. She exhibits no edema or tenderness.  Neurological: She is alert and oriented to person, place, and time.  Skin: Skin is warm and dry. No rash noted.  Psychiatric: She has a normal mood and affect.  Nursing note and vitals reviewed.   ED  Course  Procedures (including critical care time) Labs Review Labs Reviewed  CBC - Abnormal; Notable for the following:    Hemoglobin 11.1 (*)    HCT 35.2 (*)    MCV 77.5 (*)    MCH 24.4 (*)    RDW 17.0 (*)    All other components within normal limits  BASIC METABOLIC PANEL  Randolm Idol, ED    Imaging Review Dg Chest 2 View  05/29/2015  CLINICAL DATA:  Chest pain for 3 days. Shortness of breath with nausea EXAM: CHEST  2 VIEW COMPARISON:  April 20, 2014 FINDINGS: There is no edema or consolidation. The heart size and pulmonary vascularity are normal. No adenopathy. There is an aortic valve replacement present. No pneumothorax. No bone lesions. IMPRESSION: No edema or consolidation. Electronically Signed   By: Lowella Grip III M.D.   On: 05/29/2015 16:23   I have personally reviewed and evaluated these images and lab results as part of my medical decision-making.   EKG Interpretation   Date/Time:  Monday May 29 2015 15:40:38 EDT Ventricular Rate:  2  PR Interval:  172 QRS Duration: 112 QT Interval:  369 QTC Calculation: 434 R Axis:   33 Text Interpretation:  Sinus rhythm Incomplete right bundle branch block No  significant change since last tracing Confirmed by Maryan Rued  MD, Loree Fee  [phone]) on 05/29/2015 3:51:33 PM      MDM   Final diagnoses:  Chest pain of unknown etiology  Gastroesophageal reflux disease, esophagitis presence not specified    Patient is afebrile without leukocytosis or signs of infection on chest x-ray. Doubt pneumonia. She is not hypoxic or tachycardic and is overall low risk for pulmonary embolism (PERC negative). EKG is unchanged from prior does not have any acute ischemic changes. Hear score is 3. Delta troponin performed and is undetectable x2. ACS is very unlikely. Patient's symptoms are intermittent and often worse at night and with lying down. She is currently asymptomatic. Patient  discharged in stable condition. Provided a trial  prescription for Zantac for possible GERD. Will follow-up with her regular doctor for reevaluation. Strict chest pain precautions were discussed and patient will return to the ED for persistent chest pain that is not relieved by rest, chest pain associated with syncope, or other new or worsening symptoms. Patient reports understanding and agreement with plan.  Discussed with Dr. Maryan Rued, ED attending.    Gibson Ramp, MD 05/30/15 [id number]  Blanchie Dessert, MD 05/30/15 [phone]

## 2015-05-30 ENCOUNTER — Telehealth: Payer: Self-pay | Admitting: Cardiology

## 2015-05-30 NOTE — Telephone Encounter (Signed)
Pt returned call to [facility] from yesterday-pls call  [phone]

## 2015-05-30 NOTE — Telephone Encounter (Signed)
Spoke with pt, she was seen in the ER on the 15th for chest pain that was felt to be GERD. The call yesterday was after she had left work and went to the ER. She would like to f/u with dr Martinique, she is still having some issues. Follow up scheduled with dr Martinique 05-31-15.

## 2015-05-31 ENCOUNTER — Ambulatory Visit (INDEPENDENT_AMBULATORY_CARE_PROVIDER_SITE_OTHER): Payer: BLUE CROSS/BLUE SHIELD | Admitting: Cardiology

## 2015-05-31 ENCOUNTER — Encounter: Payer: Self-pay | Admitting: Cardiology

## 2015-05-31 VITALS — BP 112/86 | HR 76 | Ht 67.0 in | Wt 223.0 lb

## 2015-05-31 DIAGNOSIS — R079 Chest pain, unspecified: Secondary | ICD-10-CM

## 2015-05-31 DIAGNOSIS — Z954 Presence of other heart-valve replacement: Secondary | ICD-10-CM | POA: Diagnosis not present

## 2015-05-31 DIAGNOSIS — Z952 Presence of prosthetic heart valve: Secondary | ICD-10-CM

## 2015-05-31 DIAGNOSIS — E119 Type 2 diabetes mellitus without complications: Secondary | ICD-10-CM | POA: Diagnosis not present

## 2015-05-31 DIAGNOSIS — I10 Essential (primary) hypertension: Secondary | ICD-10-CM

## 2015-05-31 DIAGNOSIS — Z7901 Long term (current) use of anticoagulants: Secondary | ICD-10-CM | POA: Diagnosis not present

## 2015-05-31 NOTE — Patient Instructions (Addendum)
Continue your current therapy  If these symptoms persist after this week I would check with your primary care and have GI evaluation  See you back in 6 months

## 2015-05-31 NOTE — Progress Notes (Signed)
CARDIOLOGY OFFICE NOTE  Date:  05/31/2015    [name] Date of Birth: 1970-04-10 Medical Record [id number]  PCP:  Chevis Pretty, FNP  Cardiologist:  Peter Martinique  MD  Chief Complaint  Patient presents with  . Follow-up    F/U CHEST PAIN  . Shortness of Breath  . Dizziness     History of Present Illness: Anna Salinas is a 45 y.o. female who presents for evaluation of chest pain.  She has a history of AVR in 02/2014 for severe AS with a bicuspid AV. She is on chronic coumadin.  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 These were drained. Repeat CXR and Echo in April 2016 showed resolution of effusions and normal valve function.   On follow up today she reports a one week history of chest pain. This is located in mid chest and radiates to the right arm. She has associated nausea and has also had intermittent diarrhea. No fever. Pain is described as an ache and last 2 minutes. Not associated with activity. No SOB. She did break out in a sweat one day at work. She did go to the ED on Monday. Evaluation with blood work and Ecg were normal. She was started on Zantac with some improvement in symptoms.  Past Medical History  Diagnosis Date  . Essential hypertension, benign   . Palpitations   . Aortic stenosis due to bicuspid aortic valve   . Type 2 diabetes mellitus [facility])     Past Surgical History  Procedure Laterality Date  . Cesarean section  2009  . Tubal ligation    . Left and right heart catheterization with coronary angiogram N/A 01/25/2014    Procedure: LEFT AND RIGHT HEART CATHETERIZATION WITH CORONARY ANGIOGRAM;  Surgeon: Peter M Martinique, MD;  Location: [facility] CATH LAB;  Service: Cardiovascular;  Laterality: N/A;  . Cardiac catheterization    . Aortic valve replacement N/A 02/14/2014    Procedure: AORTIC VALVE REPLACEMENT (AVR);  Surgeon: Gaye Pollack, MD;  Location: [place];  Service: Open Heart Surgery;   Laterality: N/A;  . Intraoperative transesophageal echocardiogram N/A 02/14/2014    Procedure: INTRAOPERATIVE TRANSESOPHAGEAL ECHOCARDIOGRAM;  Surgeon: Gaye Pollack, MD;  Location: [facility] OR;  Service: Open Heart Surgery;  Laterality: N/A;  . Subxyphoid pericardial window N/A 03/27/2014    Procedure: SUBXYPHOID PERICARDIAL WINDOW;  Surgeon: Grace Isaac, MD;  Location: [place];  Service: Thoracic;  Laterality: N/A;  . Chest tube insertion Right 03/27/2014    Procedure: CHEST TUBE INSERTION;  Surgeon: Grace Isaac, MD;  Location: [place];  Service: Thoracic;  Laterality: Right;  . Pleural effusion drainage Right 03/27/2014    Procedure: DRAINAGE OF PLEURAL EFFUSION;  Surgeon: Grace Isaac, MD;  Location: [place];  Service: Thoracic;  Laterality: Right;  Drainage of right pleural effusion  . Pericardial fluid drainage N/A 03/27/2014    Procedure: DRAINAGE OF PERICARDIAL FLUID;  Surgeon: Grace Isaac, MD;  Location: [facility] OR;  Service: Thoracic;  Laterality: N/A;     Medications: Current Outpatient Prescriptions  Medication Sig Dispense Refill  . glucose blood (ONETOUCH VERIO) test strip Use to check BG up to bid. 100 each 5  . Insulin Degludec (TRESIBA FLEXTOUCH) 100 UNIT/ML SOPN Inject 10 Units into the skin daily. (Patient taking differently: Inject 10 Units into the skin at bedtime. ) 3 mL 3  . Insulin Pen Needle (PEN NEEDLES) 32G X 4 MM MISC 1  each by Does not apply route daily. Use with insulin pen to administer once daily 100 each 0  . metFORMIN (GLUCOPHAGE) 1000 MG tablet Take 1 tablet (1,000 mg total) by mouth 2 (two) times daily with a meal. 180 tablet 3  . ONETOUCH DELICA LANCETS FINE MISC Use to check BG up to bid 100 each 5  . ranitidine (ZANTAC) 150 MG capsule Take 1 capsule (150 mg total) by mouth daily. 30 capsule 0  . warfarin (COUMADIN) 5 MG tablet Take 1 tablet by mouth daily or as directed by coumadin clinic (Patient taking differently: Take 5 mg by mouth every night on  Sun/Tues/Thurs/Sat and 2.5 mg on Mon/Wed/Fri (nights)) 90 tablet 1   No current facility-administered medications for this visit.    Allergies: Allergies  Allergen Reactions  . Glipizide Nausea And Vomiting and Other (See Comments)    GI upset  . Metoprolol Other (See Comments)    "Made me sick"  . Shellfish Allergy Swelling    Throat and eyes were swollen.  Had difficulty breathing.  Was hospitalized.      Social History: The patient  reports that she has never smoked. She has never used smokeless tobacco. She reports that she does not drink alcohol or use illicit drugs.   Family History: The patient's family history includes Brain cancer in her brother; Cancer in her maternal grandmother; Diabetes in her mother.   Review of Systems: Please see the history of present illness.  Glucose has been poorly controlled.  All other systems are reviewed and negative.   Physical Exam: VS:  BP 112/86 mmHg  Pulse 76  Ht 5\' 7"  (1.702 m)  Wt 101.152 kg (223 lb)  BMI 34.92 kg/m2  LMP 01/29/2015 .  BMI Body mass index is 34.92 kg/(m^2).  Wt Readings from Last 3 Encounters:  05/31/15 101.152 kg (223 lb)  05/29/15 102.059 kg (225 lb)  03/31/15 101.606 kg (224 lb)    General: Pleasant. Well developed, well nourished and in no acute distress.  HEENT: Normal. Neck: Supple, no JVD, carotid bruits, or masses noted.  Cardiac: Regular rate and rhythm. No murmurs, rubs, or gallops. Her valve has a crisp click.  Sternum a looks ok. No edema.  Respiratory:  Lungs are clear to auscultation bilaterally with normal work of breathing.  GI: Soft and nontender.  MS: No deformity or atrophy. Gait and ROM intact. Skin: Warm and dry. Color is normal.  Neuro:  Strength and sensation are intact and no gross focal deficits noted.  Psych: Alert, appropriate and with normal affect.   LABORATORY DATA:  EKG:  EKG is not ordered today. Ecg from the ED was reviewed and was normal.    Lab Results    Component Value Date   WBC 7.2 05/29/2015   HGB 11.1* 05/29/2015   HCT 35.2* 05/29/2015   PLT 151 05/29/2015   GLUCOSE 314* 05/29/2015   CHOL 167 03/31/2015   TRIG 146 03/31/2015   HDL 29* 03/31/2015   LDLCALC 109* 03/31/2015   ALT 66* 03/31/2015   AST 80* 03/31/2015   NA 132* 05/29/2015   K 3.6 05/29/2015   CL 100* 05/29/2015   CREATININE 0.70 05/29/2015   BUN 8 05/29/2015   CO2 21* 05/29/2015   TSH 1.220 03/31/2015   INR 2.51* 05/29/2015   HGBA1C 10.4* 03/31/2015    BNP (last 3 results) No results for input(s): BNP in the last 8760 hours.  ProBNP (last 3 results) No results for input(s): PROBNP in  the last 8760 hours.   Other Studies Reviewed Today:   Echo: 03/26/2014 Study Conclusions - Left ventricle: The cavity size was normal. Wall thickness was normal. Systolic function was vigorous. The estimated ejection fraction was in the range of 65% to 70%. Wall motion was normal; there were no regional wall motion abnormalities. - Aortic valve: A [employer] mechanical prosthesis was present. No obvious perivalvular leak based on limited views. There was no significant regurgitation. Mean gradient (S): 15 mm Hg. Peak gradient (S): 28 mm Hg. - Right ventricle: The cavity size was below normal. - Tricuspid valve: There was trivial regurgitation. - Pulmonary arteries: Systolic pressure could not be accurately estimated. - Pericardium, extracardiac: A large pericardial effusion was identified circumferential to the heart, measures approximately 4 cm posteriorly and 3 cm anteriorly. There is compression of the right ventricle noted in the subcostal view and further evidence of tamponade physiology based on respiratory variation in mitral inflow. There was a right pleural effusion. Impressions: - Normal LV wall thickness with LVEF 65-70%. St. Jude mechanical AVR without obvious perivalvular leak based on limited and with grossly normal  gradients. A large pericardial effusion was identified circumferential to the heart, measures approximately 4 cm posteriorly and 3-4 cm anteriorly. There is compression of the right ventricle noted in the subcostal view and further evidence of tamponade physiology based on respiratory variation in mitral inflow. Right pleural effusion also noted. Results discussed with Dr. Servando Snare.  Assessment/Plan: 1. Atypical chest pain. I do not feel her symptoms are cardiac related. She had no CAD on cardiac cath. Her valve function is normal by exam. She is low risk for PE. I think it is much more likely that this is GI related- either due to GERD, gastroparesis, or possibly gallstones. Will continue H2 blocker. If symptoms persist she will need to follow up with primary care.   2. S/p AVR. Needs long term coumadin. Reviewed recommendations for SBE prophylaxis. Post pericardectomy syndrome resolved.   3. DM- poorly controlled.   4. Chronic coumadin therapy. INR monitored by primary care.   Current medicines are reviewed with the patient today.  The patient does not have concerns regarding medicines other than what has been noted above.  The following changes have been made:  See above.  Labs/ tests ordered today include:    No orders of the defined types were placed in this encounter.     Disposition:   FU with me in 6 months.  Signed: Peter Martinique MD, [facility]    05/31/2015 10:11 AM

## 2015-06-01 ENCOUNTER — Telehealth: Payer: Self-pay | Admitting: Nurse Practitioner

## 2015-06-09 ENCOUNTER — Encounter: Payer: BLUE CROSS/BLUE SHIELD | Admitting: Pharmacist

## 2015-06-15 ENCOUNTER — Telehealth: Payer: Self-pay | Admitting: *Deleted

## 2015-06-15 ENCOUNTER — Encounter: Payer: BLUE CROSS/BLUE SHIELD | Admitting: Pharmacist

## 2015-06-15 DIAGNOSIS — K219 Gastro-esophageal reflux disease without esophagitis: Secondary | ICD-10-CM

## 2015-06-15 NOTE — Telephone Encounter (Signed)
Need to know reason to see GI in order to do referral

## 2015-06-15 NOTE — Telephone Encounter (Signed)
Referral made 

## 2015-06-15 NOTE — Telephone Encounter (Signed)
Spoke with pt regarding referral Pt went to ED for chest pain and abd pain Also saw cardiologist Pt was cleared from cardiology standpoint Recommended by card to see GI for chest and abdominal pain Please advise

## 2015-06-15 NOTE — Telephone Encounter (Signed)
Cardiologist recommended patient see GI can you please refer

## 2015-06-15 NOTE — Telephone Encounter (Signed)
Pt notified that referral will be made to GI Verbalizes understanding

## 2015-06-21 ENCOUNTER — Encounter: Payer: BLUE CROSS/BLUE SHIELD | Admitting: Pharmacist

## 2015-06-27 ENCOUNTER — Other Ambulatory Visit: Payer: Self-pay | Admitting: *Deleted

## 2015-06-27 ENCOUNTER — Encounter: Payer: Self-pay | Admitting: Nurse Practitioner

## 2015-06-27 DIAGNOSIS — E119 Type 2 diabetes mellitus without complications: Secondary | ICD-10-CM

## 2015-06-28 ENCOUNTER — Ambulatory Visit (INDEPENDENT_AMBULATORY_CARE_PROVIDER_SITE_OTHER): Payer: BLUE CROSS/BLUE SHIELD | Admitting: Pharmacist

## 2015-06-28 DIAGNOSIS — E119 Type 2 diabetes mellitus without complications: Secondary | ICD-10-CM | POA: Diagnosis not present

## 2015-06-28 DIAGNOSIS — Z7901 Long term (current) use of anticoagulants: Secondary | ICD-10-CM | POA: Diagnosis not present

## 2015-06-28 LAB — COAGUCHEK XS/INR WAIVED
INR: 3 — AB (ref 0.9–1.1)
PROTHROMBIN TIME: 36.5 s

## 2015-06-28 MED ORDER — WARFARIN SODIUM 5 MG PO TABS: ORAL_TABLET | ORAL | Status: DC

## 2015-06-28 MED ORDER — INSULIN PEN NEEDLE 32G X 4 MM MISC: Status: DC

## 2015-06-28 NOTE — Patient Instructions (Signed)
Anticoagulation Dose Instructions as of 06/28/2015      Anna Salinas Tue Wed Thu Fri Sat   New Dose 5 mg 2.5 mg 5 mg 2.5 mg 5 mg 2.5 mg 5 mg    Description        Continue warfarin 5mg  - take 1/2 tablet mondays, wednesdays and fridays.  Take 1 tablet all other days.      INR was 3.0 today

## 2015-06-28 NOTE — Progress Notes (Signed)
Subjective:     Indication: aortic valve regurgitation Bleeding signs/symptoms: None Thromboembolic signs/symptoms: None  Missed Coumadin doses: None Medication changes: no Dietary changes: no Bacterial/viral infection: no Other concerns: patient needs urine microalbumin checked and document eye exam    Objective:    INR Today: 3.0 Current dose: 5mg  qd except 2.5mg  MWF     Assessment:    Therapeutic INR for goal of 2-3   Plan:    1. New dose: no change   2. Next INR: 1 month   3.  Urine microalbumin collected today - pending 4.  Eye exam done 2 months ago by Dr Wynetta Emery at my [facility] - requesting records.

## 2015-06-29 LAB — MICROALBUMIN / CREATININE URINE RATIO: Creatinine, Urine: 94.1 mg/dL

## 2015-07-17 ENCOUNTER — Ambulatory Visit (INDEPENDENT_AMBULATORY_CARE_PROVIDER_SITE_OTHER): Payer: BLUE CROSS/BLUE SHIELD | Admitting: Gastroenterology

## 2015-07-17 ENCOUNTER — Encounter: Payer: Self-pay | Admitting: Gastroenterology

## 2015-07-17 VITALS — BP 128/74 | HR 75 | Ht 69.0 in | Wt 224.0 lb

## 2015-07-17 DIAGNOSIS — R194 Change in bowel habit: Secondary | ICD-10-CM | POA: Diagnosis not present

## 2015-07-17 DIAGNOSIS — K219 Gastro-esophageal reflux disease without esophagitis: Secondary | ICD-10-CM | POA: Diagnosis not present

## 2015-07-17 DIAGNOSIS — R1084 Generalized abdominal pain: Secondary | ICD-10-CM | POA: Diagnosis not present

## 2015-07-17 DIAGNOSIS — R198 Other specified symptoms and signs involving the digestive system and abdomen: Secondary | ICD-10-CM

## 2015-07-17 MED ORDER — HYOSCYAMINE SULFATE 0.125 MG SL SUBL: 0.1250 mg | SUBLINGUAL_TABLET | Freq: Four times a day (QID) | SUBLINGUAL | Status: DC | PRN

## 2015-07-17 NOTE — Patient Instructions (Addendum)
We have sent the following medications to your pharmacy for you to pick up at your convenience: Levsin  If you are age 43 or older, your body mass index should be between 23-30. Your Body mass index is 33.06 kg/(m^2). If this is out of the aforementioned range listed, please consider follow up with your Primary Care Provider.  If you are age 34 or younger, your body mass index should be between 19-25. Your Body mass index is 33.06 kg/(m^2). If this is out of the aformentioned range listed, please consider follow up with your Primary Care Provider.   Thank you for choosing [facility]  Dr Wilfrid Lund III

## 2015-07-17 NOTE — Progress Notes (Signed)
[facility] Consult Note:  History: Anna Salinas 07/17/2015  Referring physician: Chevis Pretty, FNP  Reason for consult/chief complaint: Abdominal Pain and Diarrhea   Subjective HPI:  This is a 45 year old woman referred by her cardiologist after recent ED visit. For the last 2 months she had some intermittent abdominal pain that was vague and difficult for her to describe. It seemed to be generalized, but sometimes would rise up toward the chest and the right arm and the right upper quadrant. He became acutely worse on May 15, and she came to the [facility] ED. Limited workup there was unremarkable, it was not felt to be cardiac in nature, and she was sent home with Zantac. She does have periodic heartburn but denies dysphagia or early satiety, nausea vomiting or weight loss. She has continued to have this abdominal pain to a lesser degree since the ED visit. Again, it is ill-defined, being mostly generalized dull and crampy in nature and then might radiate toward the chest or the right upper quadrant, but not necessarily related to meals. For 4-6 months she has also tended toward postprandial bowel movements with some urgency and loose nonbloody stool.   ROS:  Review of Systems  Constitutional: Negative for appetite change and unexpected weight change.  HENT: Negative for mouth sores and voice change.   Eyes: Negative for pain and redness.  Respiratory: Positive for shortness of breath. Negative for cough.   Cardiovascular: Negative for chest pain and palpitations.  Genitourinary: Negative for dysuria and hematuria.  Musculoskeletal: Negative for myalgias and arthralgias.  Skin: Negative for pallor and rash.  Neurological: Negative for weakness and headaches.  Hematological: Negative for adenopathy.     Past Medical History: Past Medical History  Diagnosis Date  . Essential hypertension, benign   . Palpitations   . Aortic stenosis due to bicuspid aortic  valve   . Type 2 diabetes mellitus ([place])      Past Surgical History: Past Surgical History  Procedure Laterality Date  . Cesarean section  2009  . Tubal ligation    . Left and right heart catheterization with coronary angiogram N/A 01/25/2014    Procedure: LEFT AND RIGHT HEART CATHETERIZATION WITH CORONARY ANGIOGRAM;  Surgeon: Peter M Martinique, MD;  Location: [facility] CATH LAB;  Service: Cardiovascular;  Laterality: N/A;  . Cardiac catheterization    . Aortic valve replacement N/A 02/14/2014    Procedure: AORTIC VALVE REPLACEMENT (AVR);  Surgeon: Gaye Pollack, MD;  Location: [place];  Service: Open Heart Surgery;  Laterality: N/A;  . Intraoperative transesophageal echocardiogram N/A 02/14/2014    Procedure: INTRAOPERATIVE TRANSESOPHAGEAL ECHOCARDIOGRAM;  Surgeon: Gaye Pollack, MD;  Location: [facility] OR;  Service: Open Heart Surgery;  Laterality: N/A;  . Subxyphoid pericardial window N/A 03/27/2014    Procedure: SUBXYPHOID PERICARDIAL WINDOW;  Surgeon: Grace Isaac, MD;  Location: [place];  Service: Thoracic;  Laterality: N/A;  . Chest tube insertion Right 03/27/2014    Procedure: CHEST TUBE INSERTION;  Surgeon: Grace Isaac, MD;  Location: [place];  Service: Thoracic;  Laterality: Right;  . Pleural effusion drainage Right 03/27/2014    Procedure: DRAINAGE OF PLEURAL EFFUSION;  Surgeon: Grace Isaac, MD;  Location: [place];  Service: Thoracic;  Laterality: Right;  Drainage of right pleural effusion  . Pericardial fluid drainage N/A 03/27/2014    Procedure: DRAINAGE OF PERICARDIAL FLUID;  Surgeon: Grace Isaac, MD;  Location: [facility] OR;  Service: Thoracic;  Laterality: N/A;   Mechanical aortic valve  replacement for bicuspid aortic valve  Family History: Family History  Problem Relation Age of Onset  . Diabetes Mother   . Brain cancer Brother   . Colon cancer Maternal Grandmother     Social History: Social History   Social History  . Marital Status: Married    Spouse Name: N/A  . Number  of Children: 3  . Years of Education: N/A   Occupational History  . cashier    Social History Main Topics  . Smoking status: Never Smoker   . Smokeless tobacco: Never Used  . Alcohol Use: No  . Drug Use: No  . Sexual Activity: Not Asked   Other Topics Concern  . None   Social History Narrative    Allergies: Allergies  Allergen Reactions  . Glipizide Nausea And Vomiting and Other (See Comments)    GI upset  . Metoprolol Other (See Comments)    "Made me sick"  . Shellfish Allergy Swelling    Throat and eyes were swollen.  Had difficulty breathing.  Was hospitalized.      Outpatient Meds: Current Outpatient Prescriptions  Medication Sig Dispense Refill  . glucose blood (ONETOUCH VERIO) test strip Use to check BG up to bid. 100 each 5  . Insulin Degludec (TRESIBA FLEXTOUCH) 100 UNIT/ML SOPN Inject 10 Units into the skin daily. (Patient taking differently: Inject 10 Units into the skin at bedtime. ) 3 mL 3  . Insulin Pen Needle (DROPLET PEN NEEDLES) 32G X 4 MM MISC Use with insulin pen daily as directed 100 each 2  . metFORMIN (GLUCOPHAGE) 1000 MG tablet Take 1 tablet (1,000 mg total) by mouth 2 (two) times daily with a meal. 180 tablet 3  . ONETOUCH DELICA LANCETS FINE MISC Use to check BG up to bid 100 each 5  . ranitidine (ZANTAC) 150 MG tablet Take 150 mg by mouth daily.    Marland Kitchen warfarin (COUMADIN) 5 MG tablet Take 1 tablet by mouth daily or as directed by coumadin clinic 90 tablet 0  . hyoscyamine (LEVSIN SL) 0.125 MG SL tablet Place 1 tablet (0.125 mg total) under the tongue every 6 (six) hours as needed. 30 tablet 1   No current facility-administered medications for this visit.      ___________________________________________________________________ Objective  Exam:  BP 128/74 mmHg  Pulse 75  Ht 5\' 9"  (1.753 m)  Wt 224 lb (101.606 kg)  BMI 33.06 kg/m2   General: this is a(n) Overweight and otherwise well-appearing middle-aged woman   Eyes: sclera anicteric,  no redness  ENT: oral mucosa moist without lesions, no cervical or supraclavicular lymphadenopathy, good dentition  CV: RRR without murmur, S1/S2, no JVD, no peripheral edema, + valve click  Resp: clear to auscultation bilaterally, normal RR and effort noted  GI: soft, no tenderness, with active bowel sounds. No guarding or palpable organomegaly noted.  Skin; warm and dry, no rash or jaundice noted  Neuro: awake, alert and oriented x 3. Normal gross motor function and fluent speech  Labs:  CBC Latest Ref Rng 05/29/2015 05/03/2015 03/31/2015  WBC 4.0 - 10.5 K/uL 7.2 6.7 7.8  Hemoglobin 12.0 - 15.0 g/dL 11.1(L) - -  Hematocrit 36.0 - 46.0 % 35.2(L) 36.7 37.2  Platelets 150 - 400 K/uL 151 174 201        Radiologic Studies:  Unremarkable chest x-ray on 05/29/2015 No abdominal imaging  Assessment: Encounter Diagnoses  Name Primary?  . Generalized abdominal pain Yes  . Change in bowel function   . Gastroesophageal  reflux disease, esophagitis presence not specified     This sounds most like underlying IBS, not clear if that's what caused the escalated pain the day of the ED visit. It does not sound typical for biliary colic.  Plan:  A trial of as needed Levsin. If it does not help and certainly if symptoms seem to worsen, she will contact me and we can consider radiologic or endoscopic workup.  Thank you for the courtesy of this consult.  Please call me with any questions or concerns.  Nelida Meuse III  CC: Chevis Pretty, FNP

## 2015-08-08 ENCOUNTER — Ambulatory Visit: Payer: Self-pay | Admitting: Nurse Practitioner

## 2015-08-21 ENCOUNTER — Ambulatory Visit: Payer: Self-pay | Admitting: Nurse Practitioner

## 2015-08-22 ENCOUNTER — Encounter: Payer: Self-pay | Admitting: Nurse Practitioner

## 2015-09-13 ENCOUNTER — Encounter: Payer: Self-pay | Admitting: Pharmacist

## 2015-09-14 ENCOUNTER — Encounter: Payer: Self-pay | Admitting: Nurse Practitioner

## 2015-10-13 ENCOUNTER — Ambulatory Visit (INDEPENDENT_AMBULATORY_CARE_PROVIDER_SITE_OTHER): Payer: BLUE CROSS/BLUE SHIELD | Admitting: Pharmacist Clinician (PhC)/ Clinical Pharmacy Specialist

## 2015-10-13 DIAGNOSIS — Z7901 Long term (current) use of anticoagulants: Secondary | ICD-10-CM

## 2015-10-13 DIAGNOSIS — Z23 Encounter for immunization: Secondary | ICD-10-CM | POA: Diagnosis not present

## 2015-10-13 DIAGNOSIS — Z954 Presence of other heart-valve replacement: Secondary | ICD-10-CM

## 2015-10-13 DIAGNOSIS — Z952 Presence of prosthetic heart valve: Secondary | ICD-10-CM

## 2015-10-13 LAB — COAGUCHEK XS/INR WAIVED
INR: 3.4 — AB (ref 0.9–1.1)
PROTHROMBIN TIME: 41.2 s

## 2015-10-13 MED ORDER — WARFARIN SODIUM 5 MG PO TABS: ORAL_TABLET | ORAL | 3 refills | Status: DC

## 2015-10-13 NOTE — Patient Instructions (Signed)
Anticoagulation Dose Instructions as of 10/13/2015      Dorene Grebe Tue Wed Thu Fri Sat   New Dose 5 mg 2.5 mg 5 mg 2.5 mg 5 mg 2.5 mg 2.5 mg    Description   No warfarin today then follow above schedule.  INR today 3.4

## 2015-11-17 ENCOUNTER — Encounter: Payer: Self-pay | Admitting: Pharmacist Clinician (PhC)/ Clinical Pharmacy Specialist

## 2015-11-22 ENCOUNTER — Encounter (INDEPENDENT_AMBULATORY_CARE_PROVIDER_SITE_OTHER): Payer: Self-pay

## 2015-11-22 ENCOUNTER — Ambulatory Visit (INDEPENDENT_AMBULATORY_CARE_PROVIDER_SITE_OTHER): Payer: BLUE CROSS/BLUE SHIELD | Admitting: Pharmacist

## 2015-11-22 DIAGNOSIS — Z794 Long term (current) use of insulin: Secondary | ICD-10-CM

## 2015-11-22 DIAGNOSIS — Z7901 Long term (current) use of anticoagulants: Secondary | ICD-10-CM | POA: Diagnosis not present

## 2015-11-22 DIAGNOSIS — IMO0001 Reserved for inherently not codable concepts without codable children: Secondary | ICD-10-CM

## 2015-11-22 DIAGNOSIS — Z952 Presence of prosthetic heart valve: Secondary | ICD-10-CM | POA: Diagnosis not present

## 2015-11-22 DIAGNOSIS — E1165 Type 2 diabetes mellitus with hyperglycemia: Secondary | ICD-10-CM

## 2015-11-22 LAB — COAGUCHEK XS/INR WAIVED
INR: 3.3 — AB (ref 0.9–1.1)
PROTHROMBIN TIME: 39.1 s

## 2015-11-22 MED ORDER — METFORMIN HCL ER 500 MG PO TB24: 500.0000 mg | ORAL_TABLET | Freq: Two times a day (BID) | ORAL | 1 refills | Status: DC

## 2015-12-20 ENCOUNTER — Encounter (INDEPENDENT_AMBULATORY_CARE_PROVIDER_SITE_OTHER): Payer: Self-pay

## 2015-12-20 ENCOUNTER — Encounter: Payer: Self-pay | Admitting: Nurse Practitioner

## 2015-12-20 ENCOUNTER — Ambulatory Visit (INDEPENDENT_AMBULATORY_CARE_PROVIDER_SITE_OTHER): Payer: BLUE CROSS/BLUE SHIELD | Admitting: Nurse Practitioner

## 2015-12-20 VITALS — BP 115/70 | HR 72 | Temp 97.9°F | Ht 69.0 in | Wt 230.0 lb

## 2015-12-20 DIAGNOSIS — E119 Type 2 diabetes mellitus without complications: Secondary | ICD-10-CM

## 2015-12-20 DIAGNOSIS — E6609 Other obesity due to excess calories: Secondary | ICD-10-CM | POA: Diagnosis not present

## 2015-12-20 DIAGNOSIS — I10 Essential (primary) hypertension: Secondary | ICD-10-CM

## 2015-12-20 DIAGNOSIS — Z6837 Body mass index (BMI) 37.0-37.9, adult: Secondary | ICD-10-CM | POA: Diagnosis not present

## 2015-12-20 DIAGNOSIS — Z6835 Body mass index (BMI) 35.0-35.9, adult: Secondary | ICD-10-CM | POA: Diagnosis not present

## 2015-12-20 DIAGNOSIS — Z952 Presence of prosthetic heart valve: Secondary | ICD-10-CM

## 2015-12-20 DIAGNOSIS — IMO0001 Reserved for inherently not codable concepts without codable children: Secondary | ICD-10-CM

## 2015-12-20 DIAGNOSIS — Z7901 Long term (current) use of anticoagulants: Secondary | ICD-10-CM | POA: Diagnosis not present

## 2015-12-20 LAB — COAGUCHEK XS/INR WAIVED
INR: 1.8 — ABNORMAL HIGH (ref 0.9–1.1)
PROTHROMBIN TIME: 21.2 s

## 2015-12-20 LAB — BAYER DCA HB A1C WAIVED: HB A1C: 10.1 % — AB (ref <7.0)

## 2015-12-20 MED ORDER — RANITIDINE HCL 150 MG PO TABS: 150.0000 mg | ORAL_TABLET | Freq: Every day | ORAL | 1 refills | Status: DC

## 2015-12-20 MED ORDER — INSULIN DEGLUDEC 100 UNIT/ML ~~LOC~~ SOPN: 10.0000 [IU] | PEN_INJECTOR | Freq: Every day | SUBCUTANEOUS | 3 refills | Status: DC

## 2015-12-20 MED ORDER — HYOSCYAMINE SULFATE 0.125 MG SL SUBL: 0.1250 mg | SUBLINGUAL_TABLET | Freq: Four times a day (QID) | SUBLINGUAL | 1 refills | Status: DC | PRN

## 2015-12-20 MED ORDER — INSULIN DEGLUDEC 100 UNIT/ML ~~LOC~~ SOPN: 20.0000 [IU] | PEN_INJECTOR | Freq: Every day | SUBCUTANEOUS | 3 refills | Status: DC

## 2015-12-20 NOTE — Patient Instructions (Signed)
Carbohydrate Counting for Diabetes Mellitus, Adult Carbohydrate counting is a method for keeping track of how many carbohydrates you eat. Eating carbohydrates naturally increases the amount of sugar (glucose) in the blood. Counting how many carbohydrates you eat helps keep your blood glucose within normal limits, which helps you manage your diabetes (diabetes mellitus). It is important to know how many carbohydrates you can safely have in each meal. This is different for every person. A diet and nutrition specialist (registered dietitian) can help you make a meal plan and calculate how many carbohydrates you should have at each meal and snack. Carbohydrates are found in the following foods:  Grains, such as breads and cereals.  Dried beans and soy products.  Starchy vegetables, such as potatoes, peas, and corn.  Fruit and fruit juices.  Milk and yogurt.  Sweets and snack foods, such as cake, cookies, candy, chips, and soft drinks. How do I count carbohydrates? There are two ways to count carbohydrates in food. You can use either of the methods or a combination of both. Reading "Nutrition Facts" on packaged food  The "Nutrition Facts" list is included on the labels of almost all packaged foods and beverages in the [place] It includes:  The serving size.  Information about nutrients in each serving, including the grams (g) of carbohydrate per serving. To use the "Nutrition Facts":  Decide how many servings you will have.  Multiply the number of servings by the number of carbohydrates per serving.  The resulting number is the total amount of carbohydrates that you will be having. Learning standard serving sizes of other foods  When you eat foods containing carbohydrates that are not packaged or do not include "Nutrition Facts" on the label, you need to measure the servings in order to count the amount of carbohydrates:  Measure the foods that you will eat with a food scale or measuring  cup, if needed.  Decide how many standard-size servings you will eat.  Multiply the number of servings by 15. Most carbohydrate-rich foods have about 15 g of carbohydrates per serving.  For example, if you eat 8 oz (170 g) of strawberries, you will have eaten 2 servings and 30 g of carbohydrates (2 servings x 15 g = 30 g).  For foods that have more than one food mixed, such as soups and casseroles, you must count the carbohydrates in each food that is included. The following list contains standard serving sizes of common carbohydrate-rich foods. Each of these servings has about 15 g of carbohydrates:   hamburger bun or  English muffin.   oz (15 mL) syrup.   oz (14 g) jelly.  1 slice of bread.  1 six-inch tortilla.  3 oz (85 g) cooked rice or pasta.  4 oz (113 g) cooked dried beans.  4 oz (113 g) starchy vegetable, such as peas, corn, or potatoes.  4 oz (113 g) hot cereal.  4 oz (113 g) mashed potatoes or  of a large baked potato.  4 oz (113 g) canned or frozen fruit.  4 oz (120 mL) fruit juice.  4-6 crackers.  6 chicken nuggets.  6 oz (170 g) unsweetened dry cereal.  6 oz (170 g) plain fat-free yogurt or yogurt sweetened with artificial sweeteners.  8 oz (240 mL) milk.  8 oz (170 g) fresh fruit or one small piece of fruit.  24 oz (680 g) popped popcorn. Example of carbohydrate counting Sample meal  3 oz (85 g) chicken breast.  6 oz (  170 g) brown rice.  4 oz (113 g) corn.  8 oz (240 mL) milk.  8 oz (170 g) strawberries with sugar-free whipped topping. Carbohydrate calculation 1. Identify the foods that contain carbohydrates:  Rice.  Corn.  Milk.  Strawberries. 2. Calculate how many servings you have of each food:  2 servings rice.  1 serving corn.  1 serving milk.  1 serving strawberries. 3. Multiply each number of servings by 15 g:  2 servings rice x 15 g = 30 g.  1 serving corn x 15 g = 15 g.  1 serving milk x 15 g = 15  g.  1 serving strawberries x 15 g = 15 g. 4. Add together all of the amounts to find the total grams of carbohydrates eaten:  30 g + 15 g + 15 g + 15 g = 75 g of carbohydrates total. This information is not intended to replace advice given to you by your health care provider. Make sure you discuss any questions you have with your health care provider. Document Released: 12/31/2004 Document Revised: 07/21/2015 Document Reviewed: 06/14/2015 Elsevier Interactive Patient Education  2017 [employer]  

## 2015-12-20 NOTE — Progress Notes (Signed)
Subjective:    Patient ID: Anna Salinas, female    DOB: February 12, 1970, 45 y.o.   MRN: [id number]     Patient here today for follow up of chronic medical problems. No changes since last visit. No complaints  Outpatient Encounter Prescriptions as of 12/20/2015  Medication Sig  . glucose blood (ONETOUCH VERIO) test strip Use to check BG up to bid.  . hyoscyamine (LEVSIN SL) 0.125 MG SL tablet Place 1 tablet (0.125 mg total) under the tongue every 6 (six) hours as needed.  . Insulin Degludec (TRESIBA FLEXTOUCH) 100 UNIT/ML SOPN Inject 10 Units into the skin daily. (Patient taking differently: Inject 10 Units into the skin at bedtime. )  . Insulin Pen Needle (DROPLET PEN NEEDLES) 32G X 4 MM MISC Use with insulin pen daily as directed  . metFORMIN (GLUCOPHAGE XR) 500 MG 24 hr tablet Take 1-2 tablets (500-1,000 mg total) by mouth 2 (two) times daily.  [name] DELICA LANCETS FINE MISC Use to check BG up to bid  . ranitidine (ZANTAC) 150 MG tablet Take 150 mg by mouth daily.  Marland Kitchen warfarin (COUMADIN) 5 MG tablet Take 1 tablet by mouth daily or as directed by coumadin clinic   No facility-administered encounter medications on file as of 12/20/2015.           Diabetes  She presents for her follow-up diabetic visit. She has type 2 diabetes mellitus. No MedicAlert identification noted. Her disease course has been stable. Pertinent negatives for hypoglycemia include no headaches. Pertinent negatives for diabetes include no blurred vision, no chest pain, no foot ulcerations, no polydipsia, no polyphagia and no polyuria. When asked about current treatments, none were reported. She has not had a previous visit with a dietitian. Home blood sugar record trend: does not check blood sugars at home very often. Her highest blood glucose is >200 mg/dl. An ACE inhibitor/angiotensin II receptor blocker is not being taken. She does not see a podiatrist.Eye exam is not current.  Hypertension  This is a chronic  problem. The current episode started more than 1 year ago. The problem is unchanged. The problem is controlled. Pertinent negatives include no anxiety, blurred vision, chest pain, headaches, palpitations or peripheral edema. Risk factors for coronary artery disease include dyslipidemia, obesity and sedentary lifestyle. Past treatments include diuretics. The current treatment provides mild improvement. There is no history of CAD/MI or heart failure.  hypokalemia No longer on potassium supplements daily- cardiologist said to only take as needed- no c/ o lower ext cramping. AVR- long term coumadin treatment See anticoag note  psoriasis She has patches on elbows and behind ears- starting to itch more and is scaley.  Review of Systems  Constitutional: Negative.   HENT: Negative.   Eyes: Negative for blurred vision.  Cardiovascular: Negative for chest pain and palpitations.  Endocrine: Negative for polydipsia, polyphagia and polyuria.  Neurological: Negative for headaches.  Psychiatric/Behavioral: Negative.   All other systems reviewed and are negative.      Objective:   Physical Exam  Constitutional: She is oriented to person, place, and time. She appears well-developed and well-nourished.  HENT:  Head: Normocephalic.  Right Ear: Hearing, tympanic membrane, external ear and ear canal normal.  Left Ear: Hearing, tympanic membrane, external ear and ear canal normal.  Nose: Nose normal.  Mouth/Throat: Uvula is midline and oropharynx is clear and moist.  Eyes: Conjunctivae and EOM are normal. Pupils are equal, round, and reactive to light.  Neck: Trachea normal, normal range of  motion and full passive range of motion without pain. Neck supple. No JVD present. Carotid bruit is not present. No thyroid mass and no thyromegaly present.  Cardiovascular: Normal rate, regular rhythm, normal heart sounds and intact distal pulses.  Exam reveals no gallop and no friction rub.   No murmur heard. Click  of aortic valve audible  Pulmonary/Chest: Effort normal and breath sounds normal. Right breast exhibits no inverted nipple, no mass, no nipple discharge, no skin change and no tenderness. Left breast exhibits no inverted nipple, no mass, no nipple discharge, no skin change and no tenderness.  Abdominal: Soft. Bowel sounds are normal. She exhibits no distension and no mass. There is no tenderness.  Genitourinary: Vagina normal and uterus normal. No breast swelling, tenderness, discharge or bleeding.  Genitourinary Comments: bimanual exam-No adnexal masses or tenderness. Cervix parous and pink- no vaginal discharge  Musculoskeletal: Normal range of motion.  Lymphadenopathy:    She has no cervical adenopathy.  Neurological: She is alert and oriented to person, place, and time. She has normal reflexes.  Skin: Skin is warm and dry.  Erythematous scaley grey patches on bil elbows and behind ears.  Psychiatric: She has a normal mood and affect. Her behavior is normal. Judgment and thought content normal.    BP 115/70   Pulse 72   Temp 97.9 F (36.6 C) (Oral)   Ht 5\' 9"  (1.753 m)   Wt 230 lb (104.3 kg)   BMI 33.97 kg/m   hgba1c 10.1%     Assessment & Plan:  1. S/P AVR (aortic valve replacement) Keep follow up with cardiology - CoaguChek XS/INR Waived  2. Long-term (current) use of anticoagulants - CoaguChek XS/INR Waived  3. Essential hypertension, benign Low sodium diet  4. Diabetes mellitus type II, non insulin dependent (HCC) Stricter carb counting Keep diary of fasing blood sugars in AM - Bayer DCA Hb A1c Waived - insulin degludec (TRESIBA FLEXTOUCH) 100 UNIT/ML SOPN FlexTouch Pen; Inject 0.2 mLs (20 Units total) into the skin daily.  Dispense: 3 mL; Refill: 3  5. BMI 35.0-35.9,adult Discussed diet and exercise for person with BMI >25 Will recheck weight in 3-6 months  6. Class 2 obesity due to excess calories with serious comorbidity and body mass index (BMI) of 37.0 to  37.9 in adult     Labs pending Health maintenance reviewed Diet and exercise encouraged Continue all meds Follow up  In 3 months   [place], FNP

## 2015-12-30 NOTE — Progress Notes (Signed)
CARDIOLOGY OFFICE NOTE  Date:  01/01/2016    Anna Salinas Date of Birth: 08/17/1970 Medical Record [id number]  PCP:  Chevis Pretty, FNP  Cardiologist:  Kitana Gage [place]  MD  Chief Complaint  Patient presents with  . Follow-up    1 year  . Aortic Stenosis  . Diabetes Mellitus     History of Present Illness: Anna Salinas is a 45 y.o. female who presents for follow up AVR.   She has a history of AVR in 02/2014 for severe AS with a bicuspid AV. She is on chronic coumadin.  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 These were drained. Repeat CXR and Echo in April 2016 showed resolution of effusions and normal valve function.   On follow up today she reports a one month history of fatigue. 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 When she gets home she is exhausted. Sometimes if she has a day off it takes her all day to recover. Was switched to metformin XR 2 weeks age for elevated sugar. Notes frequent thirst. No SOB. No chest pain.   Past Medical History:  Diagnosis Date  . Aortic stenosis due to bicuspid aortic valve   . Essential hypertension, benign   . Palpitations   . Type 2 diabetes mellitus ([place])     Past Surgical History:  Procedure Laterality Date  . AORTIC VALVE REPLACEMENT N/A 02/14/2014   Procedure: AORTIC VALVE REPLACEMENT (AVR);  Surgeon: Gaye Pollack, MD;  Location: [place];  Service: Open Heart Surgery;  Laterality: N/A;  . CARDIAC CATHETERIZATION    . CESAREAN SECTION  2009  . CHEST TUBE INSERTION Right 03/27/2014   Procedure: CHEST TUBE INSERTION;  Surgeon: Grace Isaac, MD;  Location: [place];  Service: Thoracic;  Laterality: Right;  . INTRAOPERATIVE TRANSESOPHAGEAL ECHOCARDIOGRAM N/A 02/14/2014   Procedure: INTRAOPERATIVE TRANSESOPHAGEAL ECHOCARDIOGRAM;  Surgeon: Gaye Pollack, MD;  Location: [facility] OR;  Service: Open Heart Surgery;  Laterality: N/A;  . LEFT AND RIGHT HEART  CATHETERIZATION WITH CORONARY ANGIOGRAM N/A 01/25/2014   Procedure: LEFT AND RIGHT HEART CATHETERIZATION WITH CORONARY ANGIOGRAM;  Surgeon: Tajae Rybicki M [place]ique, MD;  Location: [facility] CATH LAB;  Service: Cardiovascular;  Laterality: N/A;  . PERICARDIAL FLUID DRAINAGE N/A 03/27/2014   Procedure: DRAINAGE OF PERICARDIAL FLUID;  Surgeon: Grace Isaac, MD;  Location: [place];  Service: Thoracic;  Laterality: N/A;  . PLEURAL EFFUSION DRAINAGE Right 03/27/2014   Procedure: DRAINAGE OF PLEURAL EFFUSION;  Surgeon: Grace Isaac, MD;  Location: [place];  Service: Thoracic;  Laterality: Right;  Drainage of right pleural effusion  . SUBXYPHOID PERICARDIAL WINDOW N/A 03/27/2014   Procedure: SUBXYPHOID PERICARDIAL WINDOW;  Surgeon: Grace Isaac, MD;  Location: [facility] OR;  Service: Thoracic;  Laterality: N/A;  . TUBAL LIGATION       Medications: Current Outpatient Prescriptions  Medication Sig Dispense Refill  . glucose blood (ONETOUCH VERIO) test strip Use to check BG up to bid. 100 each 5  . hyoscyamine (LEVSIN SL) 0.125 MG SL tablet Place 1 tablet (0.125 mg total) under the tongue every 6 (six) hours as needed. 30 tablet 1  . insulin degludec (TRESIBA FLEXTOUCH) 100 UNIT/ML SOPN FlexTouch Pen Inject 0.2 mLs (20 Units total) into the skin daily. 3 mL 3  . Insulin Pen Needle (DROPLET PEN NEEDLES) 32G X 4 MM MISC Use with insulin pen daily as directed 100 each 2  . metFORMIN (  GLUCOPHAGE XR) 500 MG 24 hr tablet Take 1-2 tablets (500-1,000 mg total) by mouth 2 (two) times daily. 120 tablet 1  . ONETOUCH DELICA LANCETS FINE MISC Use to check BG up to bid 100 each 5  . ranitidine (ZANTAC) 150 MG tablet Take 1 tablet (150 mg total) by mouth daily. 90 tablet 1  . warfarin (COUMADIN) 5 MG tablet Take 1 tablet by mouth daily or as directed by coumadin clinic 90 tablet 3   No current facility-administered medications for this visit.     Allergies: Allergies  Allergen Reactions  . Glipizide Nausea And Vomiting and  Other (See Comments)    GI upset  . Metoprolol Other (See Comments)    "Made me sick"  . Shellfish Allergy Swelling    Throat and eyes were swollen.  Had difficulty breathing.  Was hospitalized.      Social History: The patient  reports that she has never smoked. She has never used smokeless tobacco. She reports that she does not drink alcohol or use drugs.   Family History: The patient's family history includes Brain cancer in her brother; Colon cancer in her maternal grandmother; Diabetes in her mother.   Review of Systems: Please see the history of present illness.  Glucose has been elevated.  All other systems are reviewed and negative.   Physical Exam: VS:  BP 132/80   Pulse 78   Ht 5\' 8"  (1.727 m)   Wt 227 lb 12.8 oz (103.3 kg)   BMI 34.64 kg/m  .  BMI Body mass index is 34.64 kg/m.  Wt Readings from Last 3 Encounters:  01/01/16 227 lb 12.8 oz (103.3 kg)  12/20/15 230 lb (104.3 kg)  07/17/15 224 lb (101.6 kg)    General: Pleasant. Well developed, well nourished and in no acute distress.   HEENT: Normal.  Neck: Supple, no JVD, carotid bruits, or masses noted.  Cardiac: Regular rate and rhythm. No murmurs, rubs, or gallops. Her valve has a crisp click.  Sternum a looks ok. No edema.  Respiratory:  Lungs are clear to auscultation bilaterally with normal work of breathing.  GI: Soft and nontender.  MS: No deformity or atrophy. Gait and ROM intact.  Skin: Warm and dry. Color is normal.  Neuro:  Strength and sensation are intact and no gross focal deficits noted.  Psych: Alert, appropriate and with normal affect.   LABORATORY DATA:  EKG:  EKG is not ordered today.    Lab Results  Component Value Date   WBC 7.2 05/29/2015   HGB 11.1 (L) 05/29/2015   HCT 35.2 (L) 05/29/2015   PLT 151 05/29/2015   GLUCOSE 314 (H) 05/29/2015   CHOL 167 03/31/2015   TRIG 146 03/31/2015   HDL 29 (L) 03/31/2015   LDLCALC 109 (H) 03/31/2015   ALT 66 (H) 03/31/2015   AST 80 (H)  03/31/2015   NA 132 (L) 05/29/2015   K 3.6 05/29/2015   CL 100 (L) 05/29/2015   CREATININE 0.70 05/29/2015   BUN 8 05/29/2015   CO2 21 (L) 05/29/2015   TSH 1.220 03/31/2015   INR 1.8 (H) 12/20/2015   HGBA1C 10.4 (H) 03/31/2015   MICROALBUR negative 04/14/2014    BNP (last 3 results) No results for input(s): BNP in the last 8760 hours.  ProBNP (last 3 results) No results for input(s): PROBNP in the last 8760 hours.   Other Studies Reviewed Today:   Echo: 03/26/2014 Study Conclusions - Left ventricle: The cavity size was normal.  Wall thickness was normal. Systolic function was vigorous. The estimated ejection fraction was in the range of 65% to 70%. Wall motion was normal; there were no regional wall motion abnormalities. - Aortic valve: A [employer] mechanical prosthesis was present. No obvious perivalvular leak based on limited views. There was no significant regurgitation. Mean gradient (S): 15 mm Hg. Peak gradient (S): 28 mm Hg. - Right ventricle: The cavity size was below normal. - Tricuspid valve: There was trivial regurgitation. - Pulmonary arteries: Systolic pressure could not be accurately estimated. - Pericardium, extracardiac: A large pericardial effusion was identified circumferential to the heart, measures approximately 4 cm posteriorly and 3 cm anteriorly. There is compression of the right ventricle noted in the subcostal view and further evidence of tamponade physiology based on respiratory variation in mitral inflow. There was a right pleural effusion. Impressions: - Normal LV wall thickness with LVEF 65-70%. St. Jude mechanical AVR without obvious perivalvular leak based on limited and with grossly normal gradients. A large pericardial effusion was identified circumferential to the heart, measures approximately 4 cm posteriorly and 3-4 cm anteriorly. There is compression of the right ventricle noted in the  subcostal view and further evidence of tamponade physiology based on respiratory variation in mitral inflow. Right pleural effusion also noted. Results discussed with Dr. Servando Snare.  Assessment/Plan: 1. Fatigue. Possibly related to DM if sugars are poorly controlled. Also consider anemia. Reports Hgb as low as 7.5 in the past. R/o thyrois disease. I doubt her symptoms are cardiac related. Her valve function is normal by exam. Will check CMET, CBC, TSH, A1c. If these studies are unrevealing may need to get an Echo.  2. S/p AVR. Needs long term coumadin. Reviewed recommendations for SBE prophylaxis. Post pericardectomy syndrome resolved.   3. DM- poorly controlled.   4. Chronic coumadin therapy. INR monitored by primary care.   Current medicines are reviewed with the patient today.  The patient does not have concerns regarding medicines other than what has been noted above.  The following changes have been made:  See above.  Labs/ tests ordered today include:    No orders of the defined types were placed in this encounter.    Disposition:   FU with me in 6 months.  Signed: Rubens Cranston [place] MD, [facility]    01/01/2016 4:31 PM

## 2016-01-01 ENCOUNTER — Encounter: Payer: Self-pay | Admitting: Cardiology

## 2016-01-01 ENCOUNTER — Ambulatory Visit (INDEPENDENT_AMBULATORY_CARE_PROVIDER_SITE_OTHER): Payer: BLUE CROSS/BLUE SHIELD | Admitting: Cardiology

## 2016-01-01 VITALS — BP 132/80 | HR 78 | Ht 68.0 in | Wt 227.8 lb

## 2016-01-01 DIAGNOSIS — E119 Type 2 diabetes mellitus without complications: Secondary | ICD-10-CM | POA: Diagnosis not present

## 2016-01-01 DIAGNOSIS — Z952 Presence of prosthetic heart valve: Secondary | ICD-10-CM

## 2016-01-01 DIAGNOSIS — R5383 Other fatigue: Secondary | ICD-10-CM

## 2016-01-01 DIAGNOSIS — I970 Postcardiotomy syndrome: Secondary | ICD-10-CM | POA: Diagnosis not present

## 2016-01-01 DIAGNOSIS — I10 Essential (primary) hypertension: Secondary | ICD-10-CM

## 2016-01-01 NOTE — Patient Instructions (Signed)
We will check blood work today  Continue your current therapy  I will see you in 6  Months.

## 2016-01-03 DIAGNOSIS — I10 Essential (primary) hypertension: Secondary | ICD-10-CM | POA: Diagnosis not present

## 2016-01-03 DIAGNOSIS — E119 Type 2 diabetes mellitus without complications: Secondary | ICD-10-CM | POA: Diagnosis not present

## 2016-01-03 DIAGNOSIS — I970 Postcardiotomy syndrome: Secondary | ICD-10-CM | POA: Diagnosis not present

## 2016-01-03 DIAGNOSIS — Z952 Presence of prosthetic heart valve: Secondary | ICD-10-CM | POA: Diagnosis not present

## 2016-01-04 LAB — CBC WITH DIFFERENTIAL/PLATELET
BASOS PCT: 0 %
Basophils Absolute: 0 cells/uL (ref 0–200)
Eosinophils Absolute: 112 cells/uL (ref 15–500)
Eosinophils Relative: 2 %
HEMATOCRIT: 40.2 % (ref 35.0–45.0)
HEMOGLOBIN: 12.3 g/dL (ref 11.7–15.5)
LYMPHS ABS: 1624 {cells}/uL (ref 850–3900)
Lymphocytes Relative: 29 %
MCH: 25.9 pg — ABNORMAL LOW (ref 27.0–33.0)
MCHC: 30.6 g/dL — ABNORMAL LOW (ref 32.0–36.0)
MCV: 84.8 fL (ref 80.0–100.0)
MONO ABS: 392 {cells}/uL (ref 200–950)
MPV: 11.3 fL (ref 7.5–12.5)
Monocytes Relative: 7 %
Neutro Abs: 3472 cells/uL (ref 1500–7800)
Neutrophils Relative %: 62 %
Platelets: 116 10*3/uL — ABNORMAL LOW (ref 140–400)
RBC: 4.74 MIL/uL (ref 3.80–5.10)
RDW: 15.6 % — AB (ref 11.0–15.0)
WBC: 5.6 10*3/uL (ref 3.8–10.8)

## 2016-01-04 LAB — COMPREHENSIVE METABOLIC PANEL
ALBUMIN: 3.7 g/dL (ref 3.6–5.1)
ALK PHOS: 59 U/L (ref 33–115)
ALT: 54 U/L — AB (ref 6–29)
AST: 64 U/L — AB (ref 10–30)
BILIRUBIN TOTAL: 0.7 mg/dL (ref 0.2–1.2)
BUN: 12 mg/dL (ref 7–25)
CALCIUM: 8.8 mg/dL (ref 8.6–10.2)
CO2: 27 mmol/L (ref 20–31)
CREATININE: 0.59 mg/dL (ref 0.50–1.10)
Chloride: 100 mmol/L (ref 98–110)
GLUCOSE: 197 mg/dL — AB (ref 65–99)
Potassium: 3.8 mmol/L (ref 3.5–5.3)
Sodium: 135 mmol/L (ref 135–146)
Total Protein: 7 g/dL (ref 6.1–8.1)

## 2016-01-04 LAB — HEMOGLOBIN A1C
Hgb A1c MFr Bld: 10.5 % — ABNORMAL HIGH (ref <5.7)
MEAN PLASMA GLUCOSE: 255 mg/dL

## 2016-01-04 LAB — TSH: TSH: 0.93 mIU/L

## 2016-01-10 ENCOUNTER — Telehealth: Payer: Self-pay | Admitting: *Deleted

## 2016-01-10 NOTE — Telephone Encounter (Signed)
pt aware of results, Results forwarded to PCP.

## 2016-01-10 NOTE — Telephone Encounter (Addendum)
-----   Message from Peter M Martinique, MD sent at 01/06/2016  8:21 AM EST ----- Blood sugar is high. A1c 10.5. Platelets are low. Slightly elevated transaminases are improved. I think fatigue is mostly related to diabetes. Needs to follow with primary care. If symptoms persist once sugars under control we can reevaluate.  Peter Martinique MD, [facility]  Left message for pt to call

## 2016-01-31 ENCOUNTER — Encounter: Payer: Self-pay | Admitting: Pharmacist

## 2016-03-01 ENCOUNTER — Telehealth: Payer: Self-pay | Admitting: Nurse Practitioner

## 2016-03-01 ENCOUNTER — Other Ambulatory Visit: Payer: Self-pay

## 2016-03-01 DIAGNOSIS — E119 Type 2 diabetes mellitus without complications: Secondary | ICD-10-CM

## 2016-03-01 MED ORDER — INSULIN DEGLUDEC 100 UNIT/ML ~~LOC~~ SOPN: 20.0000 [IU] | PEN_INJECTOR | Freq: Every day | SUBCUTANEOUS | 0 refills | Status: DC

## 2016-03-01 MED ORDER — INSULIN DEGLUDEC 100 UNIT/ML ~~LOC~~ SOPN: 20.0000 [IU] | PEN_INJECTOR | Freq: Every day | SUBCUTANEOUS | 3 refills | Status: DC

## 2016-03-01 NOTE — Telephone Encounter (Signed)
[facility]

## 2016-03-06 MED ORDER — INSULIN DEGLUDEC 100 UNIT/ML ~~LOC~~ SOPN: 20.0000 [IU] | PEN_INJECTOR | Freq: Every day | SUBCUTANEOUS | 0 refills | Status: DC

## 2016-03-06 NOTE — Addendum Note (Signed)
Addended by: Thana Ates on: 03/06/2016 08:18 AM   Modules accepted: Orders

## 2016-03-07 ENCOUNTER — Other Ambulatory Visit: Payer: Self-pay | Admitting: *Deleted

## 2016-03-08 ENCOUNTER — Encounter: Payer: Self-pay | Admitting: Nurse Practitioner

## 2016-03-08 ENCOUNTER — Ambulatory Visit (INDEPENDENT_AMBULATORY_CARE_PROVIDER_SITE_OTHER): Payer: BLUE CROSS/BLUE SHIELD | Admitting: Nurse Practitioner

## 2016-03-08 VITALS — BP 131/76 | HR 73 | Temp 98.4°F | Ht 68.0 in | Wt 229.0 lb

## 2016-03-08 DIAGNOSIS — Z952 Presence of prosthetic heart valve: Secondary | ICD-10-CM | POA: Diagnosis not present

## 2016-03-08 DIAGNOSIS — E119 Type 2 diabetes mellitus without complications: Secondary | ICD-10-CM | POA: Diagnosis not present

## 2016-03-08 DIAGNOSIS — Z7901 Long term (current) use of anticoagulants: Secondary | ICD-10-CM

## 2016-03-08 DIAGNOSIS — Z6834 Body mass index (BMI) 34.0-34.9, adult: Secondary | ICD-10-CM | POA: Diagnosis not present

## 2016-03-08 DIAGNOSIS — I10 Essential (primary) hypertension: Secondary | ICD-10-CM

## 2016-03-08 LAB — COAGUCHEK XS/INR WAIVED
INR: 2.6 — ABNORMAL HIGH (ref 0.9–1.1)
Prothrombin Time: 31.8 s

## 2016-03-08 LAB — BAYER DCA HB A1C WAIVED: HB A1C (BAYER DCA - WAIVED): 9.3 % — ABNORMAL HIGH (ref <7.0)

## 2016-03-08 MED ORDER — INSULIN GLARGINE 100 UNIT/ML SOLOSTAR PEN: 30.0000 [IU] | PEN_INJECTOR | Freq: Every day | SUBCUTANEOUS | 5 refills | Status: DC

## 2016-03-08 MED ORDER — METFORMIN HCL ER 500 MG PO TB24: 500.0000 mg | ORAL_TABLET | Freq: Two times a day (BID) | ORAL | 1 refills | Status: DC

## 2016-03-08 NOTE — Patient Instructions (Signed)
Carbohydrate Counting for Diabetes Mellitus, Adult Carbohydrate counting is a method for keeping track of how many carbohydrates you eat. Eating carbohydrates naturally increases the amount of sugar (glucose) in the blood. Counting how many carbohydrates you eat helps keep your blood glucose within normal limits, which helps you manage your diabetes (diabetes mellitus). It is important to know how many carbohydrates you can safely have in each meal. This is different for every person. A diet and nutrition specialist (registered dietitian) can help you make a meal plan and calculate how many carbohydrates you should have at each meal and snack. Carbohydrates are found in the following foods:  Grains, such as breads and cereals.  Dried beans and soy products.  Starchy vegetables, such as potatoes, peas, and corn.  Fruit and fruit juices.  Milk and yogurt.  Sweets and snack foods, such as cake, cookies, candy, chips, and soft drinks. How do I count carbohydrates? There are two ways to count carbohydrates in food. You can use either of the methods or a combination of both. Reading "Nutrition Facts" on packaged food  The "Nutrition Facts" list is included on the labels of almost all packaged foods and beverages in the [place] It includes:  The serving size.  Information about nutrients in each serving, including the grams (g) of carbohydrate per serving. To use the "Nutrition Facts":  Decide how many servings you will have.  Multiply the number of servings by the number of carbohydrates per serving.  The resulting number is the total amount of carbohydrates that you will be having. Learning standard serving sizes of other foods  When you eat foods containing carbohydrates that are not packaged or do not include "Nutrition Facts" on the label, you need to measure the servings in order to count the amount of carbohydrates:  Measure the foods that you will eat with a food scale or measuring  cup, if needed.  Decide how many standard-size servings you will eat.  Multiply the number of servings by 15. Most carbohydrate-rich foods have about 15 g of carbohydrates per serving.  For example, if you eat 8 oz (170 g) of strawberries, you will have eaten 2 servings and 30 g of carbohydrates (2 servings x 15 g = 30 g).  For foods that have more than one food mixed, such as soups and casseroles, you must count the carbohydrates in each food that is included. The following list contains standard serving sizes of common carbohydrate-rich foods. Each of these servings has about 15 g of carbohydrates:   hamburger bun or  English muffin.   oz (15 mL) syrup.   oz (14 g) jelly.  1 slice of bread.  1 six-inch tortilla.  3 oz (85 g) cooked rice or pasta.  4 oz (113 g) cooked dried beans.  4 oz (113 g) starchy vegetable, such as peas, corn, or potatoes.  4 oz (113 g) hot cereal.  4 oz (113 g) mashed potatoes or  of a large baked potato.  4 oz (113 g) canned or frozen fruit.  4 oz (120 mL) fruit juice.  4-6 crackers.  6 chicken nuggets.  6 oz (170 g) unsweetened dry cereal.  6 oz (170 g) plain fat-free yogurt or yogurt sweetened with artificial sweeteners.  8 oz (240 mL) milk.  8 oz (170 g) fresh fruit or one small piece of fruit.  24 oz (680 g) popped popcorn. Example of carbohydrate counting Sample meal  3 oz (85 g) chicken breast.  6 oz (  170 g) brown rice.  4 oz (113 g) corn.  8 oz (240 mL) milk.  8 oz (170 g) strawberries with sugar-free whipped topping. Carbohydrate calculation 1. Identify the foods that contain carbohydrates:  Rice.  Corn.  Milk.  Strawberries. 2. Calculate how many servings you have of each food:  2 servings rice.  1 serving corn.  1 serving milk.  1 serving strawberries. 3. Multiply each number of servings by 15 g:  2 servings rice x 15 g = 30 g.  1 serving corn x 15 g = 15 g.  1 serving milk x 15 g = 15  g.  1 serving strawberries x 15 g = 15 g. 4. Add together all of the amounts to find the total grams of carbohydrates eaten:  30 g + 15 g + 15 g + 15 g = 75 g of carbohydrates total. This information is not intended to replace advice given to you by your health care provider. Make sure you discuss any questions you have with your health care provider. Document Released: 12/31/2004 Document Revised: 07/21/2015 Document Reviewed: 06/14/2015 Elsevier Interactive Patient Education  2017 [employer]  

## 2016-03-08 NOTE — Progress Notes (Signed)
Subjective:    Patient ID: Anna Salinas, female    DOB: 1970/01/23, 46 y.o.   MRN: [id number]     Patient here today for follow up of chronic medical problems. No changes since last visit. No complaints  Outpatient Encounter Prescriptions as of 03/08/2016  Medication Sig  . glucose blood (ONETOUCH VERIO) test strip Use to check BG up to bid.  . hyoscyamine (LEVSIN SL) 0.125 MG SL tablet Place 1 tablet (0.125 mg total) under the tongue every 6 (six) hours as needed.  . insulin degludec (TRESIBA FLEXTOUCH) 100 UNIT/ML SOPN FlexTouch Pen Inject 0.2 mLs (20 Units total) into the skin daily.  . Insulin Pen Needle (DROPLET PEN NEEDLES) 32G X 4 MM MISC Use with insulin pen daily as directed  . metFORMIN (GLUCOPHAGE XR) 500 MG 24 hr tablet Take 1-2 tablets (500-1,000 mg total) by mouth 2 (two) times daily.  [name] DELICA LANCETS FINE MISC Use to check BG up to bid  . ranitidine (ZANTAC) 150 MG tablet Take 1 tablet (150 mg total) by mouth daily.  Marland Kitchen warfarin (COUMADIN) 5 MG tablet Take 1 tablet by mouth daily or as directed by coumadin clinic   No facility-administered encounter medications on file as of 03/08/2016.           Diabetes  She presents for her follow-up diabetic visit. She has type 2 diabetes mellitus. No MedicAlert identification noted. Her disease course has been stable. Pertinent negatives for hypoglycemia include no headaches. Pertinent negatives for diabetes include no blurred vision, no chest pain, no foot ulcerations, no polydipsia, no polyphagia and no polyuria. When asked about current treatments, none were reported. She has not had a previous visit with a dietitian. Home blood sugar record trend: does not check blood sugars at home very often. Her highest blood glucose is >200 mg/dl. An ACE inhibitor/angiotensin II receptor blocker is not being taken. She does not see a podiatrist.Eye exam is not current.  Hypertension  This is a chronic problem. The current episode  started more than 1 year ago. The problem is unchanged. The problem is controlled. Pertinent negatives include no anxiety, blurred vision, chest pain, headaches, palpitations or peripheral edema. Risk factors for coronary artery disease include dyslipidemia, obesity and sedentary lifestyle. Past treatments include diuretics. The current treatment provides mild improvement. There is no history of CAD/MI or heart failure.  hypokalemia No longer on potassium supplements daily- cardiologist said to only take as needed- no c/ o lower ext cramping. AVR- long term coumadin treatment See anticoag note  psoriasis She has patches on elbows and behind ears- starting to itch more and is scaley.  Review of Systems  Constitutional: Negative.   HENT: Negative.   Eyes: Negative for blurred vision.  Cardiovascular: Negative for chest pain and palpitations.  Endocrine: Negative for polydipsia, polyphagia and polyuria.  Neurological: Negative for headaches.  Psychiatric/Behavioral: Negative.   All other systems reviewed and are negative.      Objective:   Physical Exam  Constitutional: She is oriented to person, place, and time. She appears well-developed and well-nourished.  HENT:  Head: Normocephalic.  Right Ear: Hearing, tympanic membrane, external ear and ear canal normal.  Left Ear: Hearing, tympanic membrane, external ear and ear canal normal.  Nose: Nose normal.  Mouth/Throat: Uvula is midline and oropharynx is clear and moist.  Eyes: Conjunctivae and EOM are normal. Pupils are equal, round, and reactive to light.  Neck: Trachea normal, normal range of motion and full passive  range of motion without pain. Neck supple. No JVD present. Carotid bruit is not present. No thyroid mass and no thyromegaly present.  Cardiovascular: Normal rate, regular rhythm, normal heart sounds and intact distal pulses.  Exam reveals no gallop and no friction rub.   No murmur heard. Click of aortic valve audible    Pulmonary/Chest: Effort normal and breath sounds normal.  Abdominal: Soft. Bowel sounds are normal. She exhibits no distension and no mass. There is no tenderness.  Genitourinary: No breast swelling, tenderness, discharge or bleeding.  Musculoskeletal: Normal range of motion.  Lymphadenopathy:    She has no cervical adenopathy.  Neurological: She is alert and oriented to person, place, and time. She has normal reflexes.  Skin: Skin is warm and dry.  Erythematous scaley grey patches on bil elbows and behind ears.  Psychiatric: She has a normal mood and affect. Her behavior is normal. Judgment and thought content normal.    BP (!) 142/82   Pulse 75   Temp 98.4 F (36.9 C) (Oral)   Ht 5\' 8"  (1.727 m)   Wt 229 lb (103.9 kg)   BMI 34.82 kg/m   hgba1c  9.3% - down from 10.1%     Assessment & Plan:  1. Diabetes mellitus type II, non insulin dependent (HCC) Changed from tresiba to lantus- and increased dose to 30u qhs- will finish out tresiba at 25u qhs then incease to 30 u when starts lantus Stricter carb counting - Bayer DCA Hb A1c Waived - Insulin Glargine (LANTUS SOLOSTAR) 100 UNIT/ML Solostar Pen; Inject 30 Units into the skin daily at 10 pm.  Dispense: 5 pen; Refill: 5 - metFORMIN (GLUCOPHAGE XR) 500 MG 24 hr tablet; Take 1-2 tablets (500-1,000 mg total) by mouth 2 (two) times daily.  Dispense: 120 tablet; Refill: 1  2. Essential hypertension, benign Low sodium diet - Lipid panel  3. S/P AVR (aortic valve replacement) Keep follow up appoitments with cardiology - CoaguChek XS/INR Waived  4. Long-term (current) use of anticoagulants - CoaguChek XS/INR Waived  5. Aortic valve replaced  6. BMI 34.0-34.9,adult Discussed diet and exercise for person with BMI >25 Will recheck weight in 3-6 months    Labs pending Health maintenance reviewed Diet and exercise encouraged Continue all meds Follow up  In 1 month   [place], FNP

## 2016-03-09 LAB — LIPID PANEL
CHOLESTEROL TOTAL: 166 mg/dL (ref 100–199)
Chol/HDL Ratio: 5.7 ratio units — ABNORMAL HIGH (ref 0.0–4.4)
HDL: 29 mg/dL — ABNORMAL LOW (ref >39)
LDL Calculated: 93 mg/dL (ref 0–99)
TRIGLYCERIDES: 218 mg/dL — AB (ref 0–149)
VLDL CHOLESTEROL CAL: 44 mg/dL — AB (ref 5–40)

## 2016-03-12 ENCOUNTER — Other Ambulatory Visit: Payer: Self-pay | Admitting: *Deleted

## 2016-03-15 ENCOUNTER — Other Ambulatory Visit: Payer: Self-pay | Admitting: Nurse Practitioner

## 2016-03-15 MED ORDER — BASAGLAR KWIKPEN 100 UNIT/ML ~~LOC~~ SOPN: 30.0000 [IU] | PEN_INJECTOR | Freq: Every day | SUBCUTANEOUS | 5 refills | Status: DC

## 2016-03-15 NOTE — Progress Notes (Unsigned)
Had to change lantus to basaglar due to insurance- give at same dose of 30u daily

## 2016-03-15 NOTE — Progress Notes (Signed)
Patient notified that rx changed. Verbalized understanding

## 2016-03-29 LAB — HM DIABETES EYE EXAM

## 2016-05-14 ENCOUNTER — Telehealth: Payer: Self-pay | Admitting: Nurse Practitioner

## 2016-05-14 NOTE — Telephone Encounter (Signed)
Is she on this? Must have 90 day

## 2016-05-14 NOTE — Telephone Encounter (Signed)
What is the name of the medication? tresiba  Have you contacted your pharmacy to request a refill? yes  Which pharmacy would you like this sent to? [facility] told her they could not refill it unless it was a 3 month supply   Patient notified that their request is being sent to the clinical staff for review and that they should receive a call once it is complete. If they do not receive a call within 24 hours they can check with their pharmacy or our office.

## 2016-05-14 NOTE — Telephone Encounter (Signed)
Chart says she is on basaglar not tresiba- ask patient to verify please

## 2016-05-15 NOTE — Telephone Encounter (Signed)
Patient states she would like to be changed to tresiba because she did not tolerate the basaglar well.  States she spotted like she was on her period and this stopped when she stopped taking the basaglar. Needs 3 month supply of tresiba.

## 2016-05-16 MED ORDER — INSULIN DEGLUDEC 200 UNIT/ML ~~LOC~~ SOPN: 30.0000 [IU] | PEN_INJECTOR | Freq: Every day | SUBCUTANEOUS | 1 refills | Status: DC

## 2016-05-24 ENCOUNTER — Ambulatory Visit (INDEPENDENT_AMBULATORY_CARE_PROVIDER_SITE_OTHER): Payer: BLUE CROSS/BLUE SHIELD | Admitting: Nurse Practitioner

## 2016-05-24 ENCOUNTER — Encounter: Payer: Self-pay | Admitting: Nurse Practitioner

## 2016-05-24 VITALS — BP 122/77 | HR 70 | Temp 97.6°F | Ht 68.0 in | Wt 232.0 lb

## 2016-05-24 DIAGNOSIS — E119 Type 2 diabetes mellitus without complications: Secondary | ICD-10-CM | POA: Diagnosis not present

## 2016-05-24 DIAGNOSIS — Z7901 Long term (current) use of anticoagulants: Secondary | ICD-10-CM

## 2016-05-24 DIAGNOSIS — I10 Essential (primary) hypertension: Secondary | ICD-10-CM

## 2016-05-24 DIAGNOSIS — K219 Gastro-esophageal reflux disease without esophagitis: Secondary | ICD-10-CM

## 2016-05-24 DIAGNOSIS — Z6834 Body mass index (BMI) 34.0-34.9, adult: Secondary | ICD-10-CM

## 2016-05-24 DIAGNOSIS — Z952 Presence of prosthetic heart valve: Secondary | ICD-10-CM | POA: Diagnosis not present

## 2016-05-24 LAB — BAYER DCA HB A1C WAIVED: HB A1C: 8.9 % — AB (ref <7.0)

## 2016-05-24 LAB — COAGUCHEK XS/INR WAIVED
INR: 2.8 — AB (ref 0.9–1.1)
PROTHROMBIN TIME: 33.4 s

## 2016-05-24 MED ORDER — INSULIN DEGLUDEC 200 UNIT/ML ~~LOC~~ SOPN: 40.0000 [IU] | PEN_INJECTOR | Freq: Every day | SUBCUTANEOUS | 1 refills | Status: DC

## 2016-05-24 MED ORDER — RANITIDINE HCL 150 MG PO TABS: 150.0000 mg | ORAL_TABLET | Freq: Every day | ORAL | 1 refills | Status: DC

## 2016-05-24 MED ORDER — METFORMIN HCL ER 500 MG PO TB24: 500.0000 mg | ORAL_TABLET | Freq: Two times a day (BID) | ORAL | 1 refills | Status: DC

## 2016-05-24 MED ORDER — WARFARIN SODIUM 5 MG PO TABS
ORAL_TABLET | ORAL | 3 refills | Status: DC
Start: 2016-05-24 — End: 2017-05-26

## 2016-05-24 NOTE — Progress Notes (Signed)
Subjective:    Patient ID: Anna Salinas, female    DOB: [birth year], 46 y.o.   MRN: [id number]  HPI  STEPHANA MORELL is here today for follow up of chronic medical problem.  Outpatient Encounter Prescriptions as of 05/24/2016  Medication Sig  . glucose blood (ONETOUCH VERIO) test strip Use to check BG up to bid.  . hyoscyamine (LEVSIN SL) 0.125 MG SL tablet Place 1 tablet (0.125 mg total) under the tongue every 6 (six) hours as needed.  . Insulin Degludec (TRESIBA FLEXTOUCH) 200 UNIT/ML SOPN Inject 30 Units into the skin daily.  . Insulin Pen Needle (DROPLET PEN NEEDLES) 32G X 4 MM MISC Use with insulin pen daily as directed  . metFORMIN (GLUCOPHAGE XR) 500 MG 24 hr tablet Take 1-2 tablets (500-1,000 mg total) by mouth 2 (two) times daily.  [name] DELICA LANCETS FINE MISC Use to check BG up to bid  . ranitidine (ZANTAC) 150 MG tablet Take 1 tablet (150 mg total) by mouth daily.  Marland Kitchen warfarin (COUMADIN) 5 MG tablet Take 1 tablet by mouth daily or as directed by coumadin clinic   No facility-administered encounter medications on file as of 05/24/2016.     1. Diabetes mellitus type II, non insulin dependent (HCC)  Last HGBA1c was 9.3%- changed from tresiba to lantus and increased dose to 30u qhs. However since last visit insurance decided to pay for tresiba so patient has remained on tresiba- she did however increase to 30u.Fasting blood are averaging sugars 130's fasting  2. Essential hypertension, benign  No c/o chest pain,SOB or HA- does not check blood pressure at home  3. Aortic valve replaced  Sees cardiologist every 6 months- has follow up appointment in july  4. BMI 34.0-34.9,adult  No recent weight gain or weight loss  5. S/P AVR (aortic valve replacement)   doing well  6. Long term current use of anticoagulant therapy   no bledding noted anywhere  7.      GERD           Zantac works well for her.  New complaints: None today     Review of Systems    Constitutional: Negative for diaphoresis.  Eyes: Negative for pain.  Respiratory: Negative for shortness of breath.   Cardiovascular: Negative for chest pain, palpitations and leg swelling.  Gastrointestinal: Negative for abdominal pain.  Endocrine: Negative for polydipsia.  Skin: Negative for rash.  Neurological: Negative for dizziness, weakness and headaches.  Hematological: Does not bruise/bleed easily.       Objective:   Physical Exam  Constitutional: She is oriented to person, place, and time. She appears well-developed and well-nourished.  HENT:  Nose: Nose normal.  Mouth/Throat: Oropharynx is clear and moist.  Eyes: EOM are normal.  Neck: Trachea normal, normal range of motion and full passive range of motion without pain. Neck supple. No JVD present. Carotid bruit is not present. No thyromegaly present.  Cardiovascular: Normal rate, regular rhythm, normal heart sounds and intact distal pulses.  Exam reveals no gallop and no friction rub.   No murmur heard. Pulmonary/Chest: Effort normal and breath sounds normal.  Abdominal: Soft. Bowel sounds are normal. She exhibits no distension and no mass. There is no tenderness.  Musculoskeletal: Normal range of motion.  Lymphadenopathy:    She has no cervical adenopathy.  Neurological: She is alert and oriented to person, place, and time. She has normal reflexes.  Skin: Skin is warm and dry.  Psychiatric: She has a  normal mood and affect. Her behavior is normal. Judgment and thought content normal.   BP 122/77   Pulse 70   Temp 97.6 F (36.4 C) (Oral)   Ht _0  (1.727 m)   Wt 232 lb (105.2 kg)   BMI 35.28 kg/m   HGBA1c 8.9%      Assessment & Plan:  1. Diabetes mellitus type II, non insulin dependent (HCC) increase tresiba to 35u for 3 days then to 40u daily Keep diary of blood sugars - Bayer DCA Hb A1c Waived - Lipid panel - metFORMIN (GLUCOPHAGE XR) 500 MG 24 hr tablet; Take 1-2 tablets (500-1,000 mg total) by mouth  2 (two) times daily.  Dispense: 120 tablet; Refill: 1 - Insulin Degludec (TRESIBA FLEXTOUCH) 200 UNIT/ML SOPN; Inject 40 Units into the skin daily.  Dispense: 15 pen; Refill: 1  2. Essential hypertension, benign Low sodium diet - CMP14+EGFR  3. Aortic valve replaced Keep follow up with cardiology  4. BMI 34.0-34.9,adult Discussed diet and exercise for person with BMI >25 Will recheck weight in 3-6 months   5. S/P AVR (aortic valve replacement) Follow up with cardiology  6. Long term current use of anticoagulant therapy Se inr documentation Recheck in 6 weeks - warfarin (COUMADIN) 5 MG tablet; Take 1 tablet by mouth daily or as directed by coumadin clinic  Dispense: 90 tablet; Refill: 3  7. Gastroesophageal reflux disease without esophagitis Avoid spicy foods Do not eat 2 hours prior to bedtime - ranitidine (ZANTAC) 150 MG tablet; Take 1 tablet (150 mg total) by mouth daily.  Dispense: 90 tablet; Refill: 1    Labs pending Health maintenance reviewed Diet and exercise encouraged Continue all meds Follow up  In 3 months   [place], FNP

## 2016-05-24 NOTE — Patient Instructions (Signed)

## 2016-05-24 NOTE — Addendum Note (Signed)
Addended by: Rolena Infante on: 05/24/2016 10:36 AM   Modules accepted: Orders

## 2016-05-24 NOTE — Addendum Note (Signed)
Addended by: Rolena Infante on: 05/24/2016 10:19 AM   Modules accepted: Orders

## 2016-05-25 LAB — CMP14+EGFR
ALBUMIN: 3.9 g/dL (ref 3.5–5.5)
ALK PHOS: 65 IU/L (ref 39–117)
ALT: 47 IU/L — AB (ref 0–32)
AST: 53 IU/L — ABNORMAL HIGH (ref 0–40)
Albumin/Globulin Ratio: 1.3 (ref 1.2–2.2)
BILIRUBIN TOTAL: 0.6 mg/dL (ref 0.0–1.2)
BUN / CREAT RATIO: 13 (ref 9–23)
BUN: 9 mg/dL (ref 6–24)
CHLORIDE: 99 mmol/L (ref 96–106)
CO2: 24 mmol/L (ref 18–29)
Calcium: 8.8 mg/dL (ref 8.7–10.2)
Creatinine, Ser: 0.7 mg/dL (ref 0.57–1.00)
GFR calc non Af Amer: 106 mL/min/{1.73_m2} (ref >59)
GFR, EST AFRICAN AMERICAN: 123 mL/min/{1.73_m2} (ref >59)
GLUCOSE: 271 mg/dL — AB (ref 65–99)
Globulin, Total: 2.9 g/dL (ref 1.5–4.5)
Potassium: 3.7 mmol/L (ref 3.5–5.2)
Sodium: 138 mmol/L (ref 134–144)
TOTAL PROTEIN: 6.8 g/dL (ref 6.0–8.5)

## 2016-05-25 LAB — LIPID PANEL
CHOLESTEROL TOTAL: 165 mg/dL (ref 100–199)
Chol/HDL Ratio: 5.3 ratio — ABNORMAL HIGH (ref 0.0–4.4)
HDL: 31 mg/dL — AB (ref >39)
LDL CALC: 104 mg/dL — AB (ref 0–99)
Triglycerides: 148 mg/dL (ref 0–149)
VLDL CHOLESTEROL CAL: 30 mg/dL (ref 5–40)

## 2016-06-21 ENCOUNTER — Telehealth: Payer: Self-pay | Admitting: Nurse Practitioner

## 2016-06-21 DIAGNOSIS — E119 Type 2 diabetes mellitus without complications: Secondary | ICD-10-CM

## 2016-06-21 MED ORDER — METFORMIN HCL ER 500 MG PO TB24: 1000.0000 mg | ORAL_TABLET | Freq: Two times a day (BID) | ORAL | 1 refills | Status: DC

## 2016-06-21 NOTE — Telephone Encounter (Signed)
[facility] aware [facility]  

## 2016-07-03 ENCOUNTER — Other Ambulatory Visit: Payer: Self-pay

## 2016-07-03 DIAGNOSIS — E119 Type 2 diabetes mellitus without complications: Secondary | ICD-10-CM

## 2016-07-03 MED ORDER — METFORMIN HCL ER 500 MG PO TB24: 1000.0000 mg | ORAL_TABLET | Freq: Two times a day (BID) | ORAL | 0 refills | Status: DC

## 2016-07-05 ENCOUNTER — Encounter: Payer: Self-pay | Admitting: *Deleted

## 2016-07-08 ENCOUNTER — Other Ambulatory Visit: Payer: Self-pay

## 2016-07-12 ENCOUNTER — Encounter: Payer: BLUE CROSS/BLUE SHIELD | Admitting: Pharmacist Clinician (PhC)/ Clinical Pharmacy Specialist

## 2016-07-15 ENCOUNTER — Encounter: Payer: Self-pay | Admitting: Nurse Practitioner

## 2016-07-18 ENCOUNTER — Telehealth: Payer: Self-pay | Admitting: Nurse Practitioner

## 2016-07-19 MED ORDER — INSULIN PEN NEEDLE 32G X 4 MM MISC: 2 refills | Status: DC

## 2016-07-19 NOTE — Telephone Encounter (Signed)
done

## 2016-07-24 NOTE — Progress Notes (Signed)
CARDIOLOGY OFFICE NOTE  Date:  07/25/2016    Anna Salinas Date of Birth: 01-27-1970 Medical Record #[id number]  PCP:  Chevis Pretty, FNP  Cardiologist:  Swan Zayed Martinique  MD  Chief Complaint  Patient presents with  . Shortness of Breath    pt states a little   . Edema    states ankles and feet      History of Present Illness: Anna Salinas is a 46 y.o. female who presents for follow up AVR.   She has a history of AVR in 02/2014 for severe AS with a bicuspid AV. She is on chronic coumadin.  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 These were drained. Repeat CXR and Echo in April 2016 showed resolution of effusions and normal valve function.   When seen last she was complaining of fatigue with periods of exhaustion. No specific cardiac cause found. Felt to be related to poorly controlled DM with A1c 10.5%. She was started on insulin along with metformin.   Since then she reports symptoms of fatigue better. Last A1c 8.9%. No edema, SOB, chest pain. Needing some dental work soon.   Past Medical History:  Diagnosis Date  . Aortic stenosis due to bicuspid aortic valve   . Essential hypertension, benign   . Palpitations   . Type 2 diabetes mellitus ([place])     Past Surgical History:  Procedure Laterality Date  . AORTIC VALVE REPLACEMENT N/A 02/14/2014   Procedure: AORTIC VALVE REPLACEMENT (AVR);  Surgeon: Gaye Pollack, MD;  Location: [place];  Service: Open Heart Surgery;  Laterality: N/A;  . CARDIAC CATHETERIZATION    . CESAREAN SECTION  2009  . CHEST TUBE INSERTION Right 03/27/2014   Procedure: CHEST TUBE INSERTION;  Surgeon: Grace Isaac, MD;  Location: [place];  Service: Thoracic;  Laterality: Right;  . INTRAOPERATIVE TRANSESOPHAGEAL ECHOCARDIOGRAM N/A 02/14/2014   Procedure: INTRAOPERATIVE TRANSESOPHAGEAL ECHOCARDIOGRAM;  Surgeon: Gaye Pollack, MD;  Location: [facility] OR;  Service: Open Heart Surgery;  Laterality: N/A;    . LEFT AND RIGHT HEART CATHETERIZATION WITH CORONARY ANGIOGRAM N/A 01/25/2014   Procedure: LEFT AND RIGHT HEART CATHETERIZATION WITH CORONARY ANGIOGRAM;  Surgeon: Kalli Greenfield M Martinique, MD;  Location[facility] CATH LAB;  Service: Cardiovascular;  Laterality: N/A;  . PERICARDIAL FLUID DRAINAGE N/A 03/27/2014   Procedure: DRAINAGE OF PERICARDIAL FLUID;  Surgeon: Grace Isaac, MD;  Location: [place];  Service: Thoracic;  Laterality: N/A;  . PLEURAL EFFUSION DRAINAGE Right 03/27/2014   Procedure: DRAINAGE OF PLEURAL EFFUSION;  Surgeon: Grace Isaac, MD;  Location: [place];  Service: Thoracic;  Laterality: Right;  Drainage of right pleural effusion  . SUBXYPHOID PERICARDIAL WINDOW N/A 03/27/2014   Procedure: SUBXYPHOID PERICARDIAL WINDOW;  Surgeon: Grace Isaac, MD;  Location: [facility] OR;  Service: Thoracic;  Laterality: N/A;  . TUBAL LIGATION       Medications: Current Outpatient Prescriptions  Medication Sig Dispense Refill  . glucose blood (ONETOUCH VERIO) test strip Use to check BG up to bid. 100 each 5  . hyoscyamine (LEVSIN SL) 0.125 MG SL tablet Place 1 tablet (0.125 mg total) under the tongue every 6 (six) hours as needed. 30 tablet 1  . Insulin Degludec (TRESIBA FLEXTOUCH) 200 UNIT/ML SOPN Inject 40 Units into the skin daily. 15 pen 1  . Insulin Pen Needle (DROPLET PEN NEEDLES) 32G X 4 MM MISC Use with insulin pen daily as directed 100 each 2  . metFORMIN (GLUCOPHAGE  XR) 500 MG 24 hr tablet Take 2 tablets (1,000 mg total) by mouth 2 (two) times daily. 360 tablet 0  . ONETOUCH DELICA LANCETS FINE MISC Use to check BG up to bid 100 each 5  . ranitidine (ZANTAC) 150 MG tablet Take 1 tablet (150 mg total) by mouth daily. 90 tablet 1  . warfarin (COUMADIN) 5 MG tablet Take 1 tablet by mouth daily or as directed by coumadin clinic 90 tablet 3   No current facility-administered medications for this visit.     Allergies: Allergies  Allergen Reactions  . Glipizide Nausea And Vomiting and Other (See  Comments)    GI upset  . Metoprolol Other (See Comments)    "Made me sick"  . Shellfish Allergy Swelling    Throat and eyes were swollen.  Had difficulty breathing.  Was hospitalized.      Social History: The patient  reports that she has never smoked. She has never used smokeless tobacco. She reports that she does not drink alcohol or use drugs.   Family History: The patient's family history includes Brain cancer in her brother; Colon cancer in her maternal grandmother; Diabetes in her mother.   Review of Systems: Please see the history of present illness.  Glucose has been elevated.  All other systems are reviewed and negative.   Physical Exam: VS:  BP 118/78   Pulse 69   Ht 5\' 8"  (1.727 m)   Wt 233 lb 9.6 oz (106 kg)   BMI 35.52 kg/m  .  BMI Body mass index is 35.52 kg/m.  Wt Readings from Last 3 Encounters:  07/25/16 233 lb 9.6 oz (106 kg)  05/24/16 232 lb (105.2 kg)  03/08/16 229 lb (103.9 kg)    General: Pleasant. Well developed, well nourished and in no acute distress.   HEENT: Normal.  Neck: Supple, no JVD, carotid bruits, or masses noted.  Cardiac: Regular rate and rhythm. No murmurs, rubs, or gallops. Her valve has a crisp click.  Sternum a looks ok. No edema.  Respiratory:  Lungs are clear to auscultation bilaterally with normal work of breathing.  GI: Soft and nontender.  MS: No deformity or atrophy. Gait and ROM intact.  Skin: Warm and dry. Color is normal.  Neuro:  Strength and sensation are intact and no gross focal deficits noted.  Psych: Alert, appropriate and with normal affect.   LABORATORY DATA:  EKG:  EKG is  ordered today. It shows NSR with normal Ecg. I have personally reviewed and interpreted this study.    Lab Results  Component Value Date   WBC 5.6 01/03/2016   HGB 12.3 01/03/2016   HCT 40.2 01/03/2016   PLT 116 (L) 01/03/2016   GLUCOSE 271 (H) 05/24/2016   CHOL 165 05/24/2016   TRIG 148 05/24/2016   HDL 31 (L) 05/24/2016   LDLCALC  104 (H) 05/24/2016   ALT 47 (H) 05/24/2016   AST 53 (H) 05/24/2016   NA 138 05/24/2016   K 3.7 05/24/2016   CL 99 05/24/2016   CREATININE 0.70 05/24/2016   BUN 9 05/24/2016   CO2 24 05/24/2016   TSH 0.93 01/03/2016   INR 2.8 (H) 05/24/2016   HGBA1C 10.5 (H) 01/03/2016   MICROALBUR negative 04/14/2014   Last A1c 05/24/16 was 8.9% BNP (last 3 results) No results for input(s): BNP in the last 8760 hours.  ProBNP (last 3 results) No results for input(s): PROBNP in the last 8760 hours.   Other Studies Reviewed Today:  Echo: 03/26/2014 Study Conclusions - Left ventricle: The cavity size was normal. Wall thickness was normal. Systolic function was vigorous. The estimated ejection fraction was in the range of 65% to 70%. Wall motion was normal; there were no regional wall motion abnormalities. - Aortic valve: A [employer] mechanical prosthesis was present. No obvious perivalvular leak based on limited views. There was no significant regurgitation. Mean gradient (S): 15 mm Hg. Peak gradient (S): 28 mm Hg. - Right ventricle: The cavity size was below normal. - Tricuspid valve: There was trivial regurgitation. - Pulmonary arteries: Systolic pressure could not be accurately estimated. - Pericardium, extracardiac: A large pericardial effusion was identified circumferential to the heart, measures approximately 4 cm posteriorly and 3 cm anteriorly. There is compression of the right ventricle noted in the subcostal view and further evidence of tamponade physiology based on respiratory variation in mitral inflow. There was a right pleural effusion. Impressions: - Normal LV wall thickness with LVEF 65-70%. St. Jude mechanical AVR without obvious perivalvular leak based on limited and with grossly normal gradients. A large pericardial effusion was identified circumferential to the heart, measures approximately 4 cm posteriorly and 3-4 cm  anteriorly. There is compression of the right ventricle noted in the subcostal view and further evidence of tamponade physiology based on respiratory variation in mitral inflow. Right pleural effusion also noted. Results discussed with Dr. Servando Snare.  Assessment/Plan: 1. S/p AVR. Needs long term coumadin. Reviewed recommendations for SBE prophylaxis. Post pericardectomy syndrome resolved. I don't think she needs to stop coumadin for planned dental work.  3. DM- poorly controlled but improving. Per primary care.   4. Chronic coumadin therapy. INR monitored by primary care.   Current medicines are reviewed with the patient today.  The patient does not have concerns regarding medicines other than what has been noted above.  The following changes have been made:  See above.  Labs/ tests ordered today include:    No orders of the defined types were placed in this encounter.    Disposition:   FU with me in 6 months.  Signed: Danniella Robben Martinique MD, [facility]    07/25/2016 5:29 PM

## 2016-07-25 ENCOUNTER — Encounter: Payer: Self-pay | Admitting: Cardiology

## 2016-07-25 ENCOUNTER — Ambulatory Visit (INDEPENDENT_AMBULATORY_CARE_PROVIDER_SITE_OTHER): Payer: BLUE CROSS/BLUE SHIELD | Admitting: Cardiology

## 2016-07-25 VITALS — BP 118/78 | HR 69 | Ht 68.0 in | Wt 233.6 lb

## 2016-07-25 DIAGNOSIS — I10 Essential (primary) hypertension: Secondary | ICD-10-CM | POA: Diagnosis not present

## 2016-07-25 DIAGNOSIS — E119 Type 2 diabetes mellitus without complications: Secondary | ICD-10-CM | POA: Diagnosis not present

## 2016-07-25 DIAGNOSIS — Z952 Presence of prosthetic heart valve: Secondary | ICD-10-CM | POA: Diagnosis not present

## 2016-07-25 NOTE — Patient Instructions (Addendum)
Continue your current therapy  I will see you in 6 months.   

## 2016-07-29 NOTE — Addendum Note (Signed)
Addended by: Zebedee Iba on: 07/29/2016 02:16 PM   Modules accepted: Orders

## 2016-08-26 ENCOUNTER — Telehealth: Payer: Self-pay | Admitting: Nurse Practitioner

## 2016-08-26 DIAGNOSIS — Z6836 Body mass index (BMI) 36.0-36.9, adult: Secondary | ICD-10-CM | POA: Diagnosis not present

## 2016-08-26 DIAGNOSIS — Z1239 Encounter for other screening for malignant neoplasm of breast: Secondary | ICD-10-CM | POA: Diagnosis not present

## 2016-08-26 DIAGNOSIS — E119 Type 2 diabetes mellitus without complications: Secondary | ICD-10-CM

## 2016-08-26 DIAGNOSIS — Z01419 Encounter for gynecological examination (general) (routine) without abnormal findings: Secondary | ICD-10-CM | POA: Diagnosis not present

## 2016-08-26 MED ORDER — INSULIN DEGLUDEC 200 UNIT/ML ~~LOC~~ SOPN: 40.0000 [IU] | PEN_INJECTOR | Freq: Every day | SUBCUTANEOUS | 0 refills | Status: DC

## 2016-08-26 NOTE — Telephone Encounter (Signed)
Patient aware medication has been sent to pharmacy and to keep follow up appt with MMM

## 2016-08-30 ENCOUNTER — Ambulatory Visit: Payer: BLUE CROSS/BLUE SHIELD | Admitting: Nurse Practitioner

## 2016-09-02 ENCOUNTER — Telehealth: Payer: Self-pay | Admitting: Nurse Practitioner

## 2016-09-02 NOTE — Telephone Encounter (Signed)
Spoke with Educational psychologist RX covered by insurance Pt notified

## 2016-09-10 ENCOUNTER — Ambulatory Visit: Payer: BLUE CROSS/BLUE SHIELD | Admitting: Nurse Practitioner

## 2016-09-26 ENCOUNTER — Ambulatory Visit (INDEPENDENT_AMBULATORY_CARE_PROVIDER_SITE_OTHER): Payer: BLUE CROSS/BLUE SHIELD | Admitting: Nurse Practitioner

## 2016-09-26 ENCOUNTER — Telehealth: Payer: Self-pay | Admitting: Nurse Practitioner

## 2016-09-26 ENCOUNTER — Encounter: Payer: Self-pay | Admitting: Nurse Practitioner

## 2016-09-26 VITALS — BP 117/80 | HR 79 | Temp 98.5°F | Ht 68.0 in | Wt 232.0 lb

## 2016-09-26 DIAGNOSIS — E119 Type 2 diabetes mellitus without complications: Secondary | ICD-10-CM

## 2016-09-26 DIAGNOSIS — Z952 Presence of prosthetic heart valve: Secondary | ICD-10-CM | POA: Diagnosis not present

## 2016-09-26 DIAGNOSIS — Z6834 Body mass index (BMI) 34.0-34.9, adult: Secondary | ICD-10-CM | POA: Diagnosis not present

## 2016-09-26 DIAGNOSIS — L409 Psoriasis, unspecified: Secondary | ICD-10-CM

## 2016-09-26 DIAGNOSIS — Z1231 Encounter for screening mammogram for malignant neoplasm of breast: Secondary | ICD-10-CM | POA: Diagnosis not present

## 2016-09-26 DIAGNOSIS — I10 Essential (primary) hypertension: Secondary | ICD-10-CM

## 2016-09-26 DIAGNOSIS — Z7901 Long term (current) use of anticoagulants: Secondary | ICD-10-CM | POA: Diagnosis not present

## 2016-09-26 DIAGNOSIS — K219 Gastro-esophageal reflux disease without esophagitis: Secondary | ICD-10-CM

## 2016-09-26 LAB — COAGUCHEK XS/INR WAIVED
INR: 2.9 — ABNORMAL HIGH (ref 0.9–1.1)
Prothrombin Time: 34.4 s

## 2016-09-26 LAB — BAYER DCA HB A1C WAIVED: HB A1C: 8.3 % — AB (ref <7.0)

## 2016-09-26 MED ORDER — TRIAMCINOLONE ACETONIDE 0.1 % EX CREA: 1.0000 "application " | TOPICAL_CREAM | Freq: Two times a day (BID) | CUTANEOUS | 1 refills | Status: DC

## 2016-09-26 MED ORDER — INSULIN PEN NEEDLE 32G X 4 MM MISC: 2 refills | Status: DC

## 2016-09-26 MED ORDER — INSULIN DEGLUDEC 200 UNIT/ML ~~LOC~~ SOPN: 45.0000 [IU] | PEN_INJECTOR | Freq: Every day | SUBCUTANEOUS | 1 refills | Status: DC

## 2016-09-26 MED ORDER — METFORMIN HCL ER 500 MG PO TB24: 1000.0000 mg | ORAL_TABLET | Freq: Two times a day (BID) | ORAL | 1 refills | Status: DC

## 2016-09-26 MED ORDER — RANITIDINE HCL 150 MG PO TABS: 150.0000 mg | ORAL_TABLET | Freq: Every day | ORAL | 1 refills | Status: DC

## 2016-09-26 NOTE — Patient Instructions (Signed)

## 2016-09-26 NOTE — Telephone Encounter (Signed)
Was already sent in for 90 days

## 2016-09-26 NOTE — Addendum Note (Signed)
Addended by: Marin Olp on: 09/26/2016 05:28 PM   Modules accepted: Orders

## 2016-09-26 NOTE — Progress Notes (Addendum)
Subjective:    Patient ID: Anna Salinas, female    DOB: 07/27/1970, 46 y.o.   MRN: [id number]  HPI  Anna Salinas is here today for follow up of chronic medical problem.  Outpatient Encounter Prescriptions as of 09/26/2016  Medication Sig  . glucose blood (ONETOUCH VERIO) test strip Use to check BG up to bid.  . hyoscyamine (LEVSIN SL) 0.125 MG SL tablet Place 1 tablet (0.125 mg total) under the tongue every 6 (six) hours as needed.  . Insulin Degludec (TRESIBA FLEXTOUCH) 200 UNIT/ML SOPN Inject 40 Units into the skin daily.  . Insulin Pen Needle (DROPLET PEN NEEDLES) 32G X 4 MM MISC Use with insulin pen daily as directed  . metFORMIN (GLUCOPHAGE XR) 500 MG 24 hr tablet Take 2 tablets (1,000 mg total) by mouth 2 (two) times daily.  [name] DELICA LANCETS FINE MISC Use to check BG up to bid  . ranitidine (ZANTAC) 150 MG tablet Take 1 tablet (150 mg total) by mouth daily.  Marland Kitchen warfarin (COUMADIN) 5 MG tablet Take 1 tablet by mouth daily or as directed by coumadin clinic   No facility-administered encounter medications on file as of 09/26/2016.     1. Diabetes mellitus type II, non insulin dependent (HCC)  Last Hgba1c was 8.9%. Increases trulicity to 70Y daily- does not check blood sugar everyday- but when she does check it it averages around 150.  2. Essential hypertension, benign  No c/o chest pain , SOB or headache. Does not check blood pressure at home  3. S/P AVR (aortic valve replacement)  Sees cardiologist every 6 months  4. BMI 34.0-34.9,adult  No recent weight changes  5. Long term current use of anticoagulant therapy  Will check INR today    New complaints: Psoriasis on elbows- would like some cream Social history: Works at [employer]  Constitutional: Negative for activity change and appetite change.  HENT: Negative.   Eyes: Negative for pain.  Respiratory: Negative for shortness of breath.   Cardiovascular: Negative for chest pain,  palpitations and leg swelling.  Gastrointestinal: Negative for abdominal pain.  Endocrine: Negative for polydipsia.  Genitourinary: Negative.   Skin: Negative for rash.  Neurological: Negative for dizziness, weakness and headaches.  Hematological: Does not bruise/bleed easily.  Psychiatric/Behavioral: Negative.   All other systems reviewed and are negative.      Objective:   Physical Exam  Constitutional: She is oriented to person, place, and time. She appears well-developed and well-nourished.  HENT:  Nose: Nose normal.  Mouth/Throat: Oropharynx is clear and moist.  Eyes: EOM are normal.  Neck: Trachea normal, normal range of motion and full passive range of motion without pain. Neck supple. No JVD present. Carotid bruit is not present. No thyromegaly present.  Cardiovascular: Normal rate, regular rhythm and intact distal pulses.  Exam reveals no gallop and no friction rub.   Murmur (2/6 systolic miurmur- with clicking of new aortic valve) heard. Pulmonary/Chest: Effort normal and breath sounds normal.  Abdominal: Soft. Bowel sounds are normal. She exhibits no distension and no mass. There is no tenderness.  Musculoskeletal: Normal range of motion.  Lymphadenopathy:    She has no cervical adenopathy.  Neurological: She is alert and oriented to person, place, and time. She has normal reflexes.  Skin: Skin is warm and dry.  Erythematous silvery plaque areas on bil elbows  Psychiatric: She has a normal mood and affect. Her behavior is normal. Judgment and thought content  normal.   BP 117/80   Pulse 79   Temp 98.5 F (36.9 C) (Oral)   Ht '5\' 8"'$  (1.727 m)   Wt 232 lb (105.2 kg)   BMI 35.28 kg/m   HGBA1c 8.3%       Assessment & Plan:  1. Diabetes mellitus type II, non insulin dependent ([place]) Keep diaryu of blood sugars daily x1 month- rto follow up - Bayer DCA Hb A1c Waived - Lipid panel - Microalbumin / creatinine urine ratio - metFORMIN (GLUCOPHAGE XR) 500 MG 24 hr  tablet; Take 2 tablets (1,000 mg total) by mouth 2 (two) times daily.  Dispense: 360 tablet; Refill: 1 - Insulin Degludec (TRESIBA FLEXTOUCH) 200 UNIT/ML SOPN; Inject 46 Units into the skin daily.  Dispense: 15 pen; Refill: 1  2. Essential hypertension, benign Low sodium diet - CMP14+EGFR  3. S/P AVR (aortic valve replacement) - CoaguChek XS/INR Waived  4. BMI 34.0-34.9,adult Discussed diet and exercise for person with BMI >25 Will recheck weight in 3-6 months  5. Aortic valve replaced Keep follow up with cardiology  6. Long term current use of anticoagulant therapy See INR sheets  7. Gastroesophageal reflux disease without esophagitis Avoid spicy foods Do not eat 2 hours prior to bedtime - ranitidine (ZANTAC) 150 MG tablet; Take 1 tablet (150 mg total) by mouth daily.  Dispense: 90 tablet; Refill: 1  8. Psoriasis -triamcinolone cream 0.5% apply to affected area BID  Labs pending Health maintenance reviewed Diet and exercise encouraged Continue all meds Follow up  In 1 month   [place], FNP

## 2016-09-26 NOTE — Telephone Encounter (Signed)
Pt notified RX was changed and sent into [facility] per pt request Okayed per MMM

## 2016-09-26 NOTE — Addendum Note (Signed)
Addended by: Chevis Pretty on: 09/26/2016 12:45 PM   Modules accepted: Orders

## 2016-09-27 LAB — CMP14+EGFR
A/G RATIO: 1.2 (ref 1.2–2.2)
ALT: 30 IU/L (ref 0–32)
AST: 39 IU/L (ref 0–40)
Albumin: 3.7 g/dL (ref 3.5–5.5)
Alkaline Phosphatase: 59 IU/L (ref 39–117)
BUN/Creatinine Ratio: 12 (ref 9–23)
BUN: 9 mg/dL (ref 6–24)
Bilirubin Total: 0.6 mg/dL (ref 0.0–1.2)
CO2: 25 mmol/L (ref 20–29)
Calcium: 8.9 mg/dL (ref 8.7–10.2)
Chloride: 102 mmol/L (ref 96–106)
Creatinine, Ser: 0.74 mg/dL (ref 0.57–1.00)
GFR, EST AFRICAN AMERICAN: 114 mL/min/{1.73_m2} (ref >59)
GFR, EST NON AFRICAN AMERICAN: 99 mL/min/{1.73_m2} (ref >59)
Globulin, Total: 3.1 g/dL (ref 1.5–4.5)
Glucose: 164 mg/dL — ABNORMAL HIGH (ref 65–99)
Potassium: 4 mmol/L (ref 3.5–5.2)
Sodium: 137 mmol/L (ref 134–144)
TOTAL PROTEIN: 6.8 g/dL (ref 6.0–8.5)

## 2016-09-27 LAB — LIPID PANEL
CHOL/HDL RATIO: 4.9 ratio — AB (ref 0.0–4.4)
Cholesterol, Total: 158 mg/dL (ref 100–199)
HDL: 32 mg/dL — AB (ref >39)
LDL CALC: 105 mg/dL — AB (ref 0–99)
Triglycerides: 107 mg/dL (ref 0–149)
VLDL CHOLESTEROL CAL: 21 mg/dL (ref 5–40)

## 2016-09-29 ENCOUNTER — Other Ambulatory Visit: Payer: Self-pay | Admitting: Nurse Practitioner

## 2016-09-29 DIAGNOSIS — E119 Type 2 diabetes mellitus without complications: Secondary | ICD-10-CM

## 2016-10-01 DIAGNOSIS — N938 Other specified abnormal uterine and vaginal bleeding: Secondary | ICD-10-CM | POA: Diagnosis not present

## 2016-10-01 DIAGNOSIS — Z6836 Body mass index (BMI) 36.0-36.9, adult: Secondary | ICD-10-CM | POA: Diagnosis not present

## 2016-10-10 DIAGNOSIS — Z6835 Body mass index (BMI) 35.0-35.9, adult: Secondary | ICD-10-CM | POA: Diagnosis not present

## 2016-10-10 DIAGNOSIS — N938 Other specified abnormal uterine and vaginal bleeding: Secondary | ICD-10-CM | POA: Diagnosis not present

## 2016-10-10 DIAGNOSIS — N939 Abnormal uterine and vaginal bleeding, unspecified: Secondary | ICD-10-CM | POA: Diagnosis not present

## 2016-11-05 DIAGNOSIS — N938 Other specified abnormal uterine and vaginal bleeding: Secondary | ICD-10-CM | POA: Diagnosis not present

## 2016-11-05 DIAGNOSIS — Z6835 Body mass index (BMI) 35.0-35.9, adult: Secondary | ICD-10-CM | POA: Diagnosis not present

## 2016-12-17 ENCOUNTER — Encounter: Payer: Self-pay | Admitting: Nurse Practitioner

## 2016-12-17 ENCOUNTER — Ambulatory Visit: Payer: BLUE CROSS/BLUE SHIELD | Admitting: Nurse Practitioner

## 2016-12-17 VITALS — BP 101/63 | HR 88 | Temp 98.2°F | Ht 68.0 in | Wt 230.0 lb

## 2016-12-17 DIAGNOSIS — I10 Essential (primary) hypertension: Secondary | ICD-10-CM

## 2016-12-17 DIAGNOSIS — Z952 Presence of prosthetic heart valve: Secondary | ICD-10-CM | POA: Diagnosis not present

## 2016-12-17 DIAGNOSIS — Z7901 Long term (current) use of anticoagulants: Secondary | ICD-10-CM | POA: Diagnosis not present

## 2016-12-17 DIAGNOSIS — E119 Type 2 diabetes mellitus without complications: Secondary | ICD-10-CM | POA: Diagnosis not present

## 2016-12-17 DIAGNOSIS — Z6834 Body mass index (BMI) 34.0-34.9, adult: Secondary | ICD-10-CM | POA: Diagnosis not present

## 2016-12-17 LAB — COAGUCHEK XS/INR WAIVED
INR: 2.2 — AB (ref 0.9–1.1)
Prothrombin Time: 26.4 s

## 2016-12-17 LAB — BAYER DCA HB A1C WAIVED: HB A1C: 7.9 % — AB (ref <7.0)

## 2016-12-17 MED ORDER — INSULIN DEGLUDEC 200 UNIT/ML ~~LOC~~ SOPN: 55.0000 [IU] | PEN_INJECTOR | Freq: Every day | SUBCUTANEOUS | 1 refills | Status: DC

## 2016-12-17 NOTE — Progress Notes (Signed)
Subjective:    Patient ID: Anna Salinas, female    DOB: 08-Sep-1970, 46 y.o.   MRN: [id number]  HPI   JEZEBEL POLLET is here today for follow up of chronic medical problem.  Outpatient Encounter Medications as of 12/17/2016  Medication Sig  . glucose blood (ONETOUCH VERIO) test strip Use to check BG up to bid.  . hyoscyamine (LEVSIN SL) 0.125 MG SL tablet Place 1 tablet (0.125 mg total) under the tongue every 6 (six) hours as needed.  . Insulin Degludec (TRESIBA FLEXTOUCH) 200 UNIT/ML SOPN Inject 46 Units into the skin daily.  . Insulin Pen Needle (DROPLET PEN NEEDLES) 32G X 4 MM MISC Use with insulin pen daily as directed  . metFORMIN (GLUCOPHAGE XR) 500 MG 24 hr tablet Take 2 tablets (1,000 mg total) by mouth 2 (two) times daily.  [name] DELICA LANCETS FINE MISC Use to check BG up to bid  . ranitidine (ZANTAC) 150 MG tablet Take 1 tablet (150 mg total) by mouth daily.  Marland Kitchen triamcinolone cream (KENALOG) 0.1 % Apply 1 application topically 2 (two) times daily.  Marland Kitchen warfarin (COUMADIN) 5 MG tablet Take 1 tablet by mouth daily or as directed by coumadin clinic   No facility-administered encounter medications on file as of 12/17/2016.     1. Diabetes mellitus type II, non insulin dependent (HCC)  Last HGBA!C was 8.3% tresiba was increased to 46u. Patient does not check blood sugars everyday. When does take it has been 120-130s in  morning  2. Essential hypertension, benign   no c/o chest pain, sob or headache- does not check blood presures at home. BP Readings from Last 3 Encounters:  12/17/16 101/63  09/26/16 117/80  07/25/16 118/78     3. Long term current use of anticoagulant therapy  No bleeding or excessive bruising  4. S/P AVR (aortic valve replacement)  Doing well has not seen cardiology since July 2018  5. BMI 34.0-34.9,adult  No recent weight changes    Indication: aortic valve replacement Bleeding signs/symptoms: None Thromboembolic signs/symptoms: None  Missed  Coumadin doses: None Medication changes: no Dietary changes: no Bacterial/viral infection: no Other concerns: no     New complaints: Has been seeing Dr. Evie Lacks for dysfunctional uterine bleeding- bleeding has currently stopped.  Social history: Lives with husband and 2 children   Review of Systems  Constitutional: Negative for activity change and appetite change.  HENT: Negative.   Eyes: Negative for pain.  Respiratory: Negative for shortness of breath.   Cardiovascular: Negative for chest pain, palpitations and leg swelling.  Gastrointestinal: Negative for abdominal pain.  Endocrine: Negative for polydipsia.  Genitourinary: Negative.   Skin: Negative for rash.  Neurological: Negative for dizziness, weakness and headaches.  Hematological: Does not bruise/bleed easily.  Psychiatric/Behavioral: Negative.   All other systems reviewed and are negative.      Objective:   Physical Exam  Constitutional: She is oriented to person, place, and time. She appears well-developed and well-nourished.  HENT:  Nose: Nose normal.  Mouth/Throat: Oropharynx is clear and moist.  Eyes: EOM are normal.  Neck: Trachea normal, normal range of motion and full passive range of motion without pain. Neck supple. No JVD present. Carotid bruit is not present. No thyromegaly present.  Cardiovascular: Normal rate, regular rhythm, normal heart sounds and intact distal pulses. Exam reveals no gallop and no friction rub.  No murmur heard. Clicking noise at aortic valve  Pulmonary/Chest: Effort normal and breath sounds normal.  Abdominal: Soft.  Bowel sounds are normal. She exhibits no distension and no mass. There is no tenderness.  Musculoskeletal: Normal range of motion.  Lymphadenopathy:    She has no cervical adenopathy.  Neurological: She is alert and oriented to person, place, and time. She has normal reflexes.  Skin: Skin is warm and dry.  Psychiatric: She has a normal mood and affect. Her  behavior is normal. Judgment and thought content normal.   BP 101/63   Pulse 88   Temp 98.2 F (36.8 C) (Oral)   Ht '5\' 8"'$  (1.727 m)   Wt 230 lb (104.3 kg)   BMI 34.97 kg/m   hgba1c was 7.9% Coumadin dose 2.'5mg'$  daily except '5mg'$  M,W, and F INR 2.2 today    Assessment & Plan:  1. Diabetes mellitus type II, non insulin dependent (HCC) Increase tresibe to 50u for 3 days then to 55u Keep diary of blood sugars in diet - Bayer DCA Hb A1c Waived - Lipid panel - Microalbumin / creatinine urine ratio - Insulin Degludec (TRESIBA FLEXTOUCH) 200 UNIT/ML SOPN; Inject 56 Units into the skin daily.  Dispense: 15 pen; Refill: 1  2. Essential hypertension, benign Low sodium diet - CMP14+EGFR  3. Long term current use of anticoagulant therapy - CoaguChek XS/INR Waived  4. S/P AVR (aortic valve replacement) Keep follow up with cardiology  5. BMI 34.0-34.9,adult Discussed diet and exercise for person with BMI >25 Will recheck weight in 3-6 months    Labs pending Health maintenance reviewed Diet and exercise encouraged Continue all meds Follow up  In 3 months   Pamelia Center, FNP

## 2016-12-18 LAB — LIPID PANEL
CHOL/HDL RATIO: 4.4 ratio (ref 0.0–4.4)
CHOLESTEROL TOTAL: 145 mg/dL (ref 100–199)
HDL: 33 mg/dL — AB (ref >39)
LDL CALC: 92 mg/dL (ref 0–99)
TRIGLYCERIDES: 100 mg/dL (ref 0–149)
VLDL Cholesterol Cal: 20 mg/dL (ref 5–40)

## 2016-12-18 LAB — CMP14+EGFR
ALT: 29 IU/L (ref 0–32)
AST: 34 IU/L (ref 0–40)
Albumin/Globulin Ratio: 1.4 (ref 1.2–2.2)
Albumin: 4 g/dL (ref 3.5–5.5)
Alkaline Phosphatase: 65 IU/L (ref 39–117)
BUN/Creatinine Ratio: 20 (ref 9–23)
BUN: 13 mg/dL (ref 6–24)
Bilirubin Total: 0.7 mg/dL (ref 0.0–1.2)
CALCIUM: 8.5 mg/dL — AB (ref 8.7–10.2)
CO2: 22 mmol/L (ref 20–29)
CREATININE: 0.66 mg/dL (ref 0.57–1.00)
Chloride: 101 mmol/L (ref 96–106)
GFR calc Af Amer: 124 mL/min/{1.73_m2} (ref >59)
GFR, EST NON AFRICAN AMERICAN: 108 mL/min/{1.73_m2} (ref >59)
GLUCOSE: 153 mg/dL — AB (ref 65–99)
Globulin, Total: 2.8 g/dL (ref 1.5–4.5)
Potassium: 4 mmol/L (ref 3.5–5.2)
Sodium: 139 mmol/L (ref 134–144)
Total Protein: 6.8 g/dL (ref 6.0–8.5)

## 2017-01-17 IMAGING — CR DG CHEST 1V PORT
1 series · 1 of 1 positions shown · non-contrast
Comparison: PA and lateral chest 02/10/2014.  CT chest 02/02/2014.

CLINICAL DATA: Status post aortic valve replacement today for
aortic stenosis secondary to a bicuspid aortic valve.

EXAM:
PORTABLE CHEST - 1 VIEW

[AP]
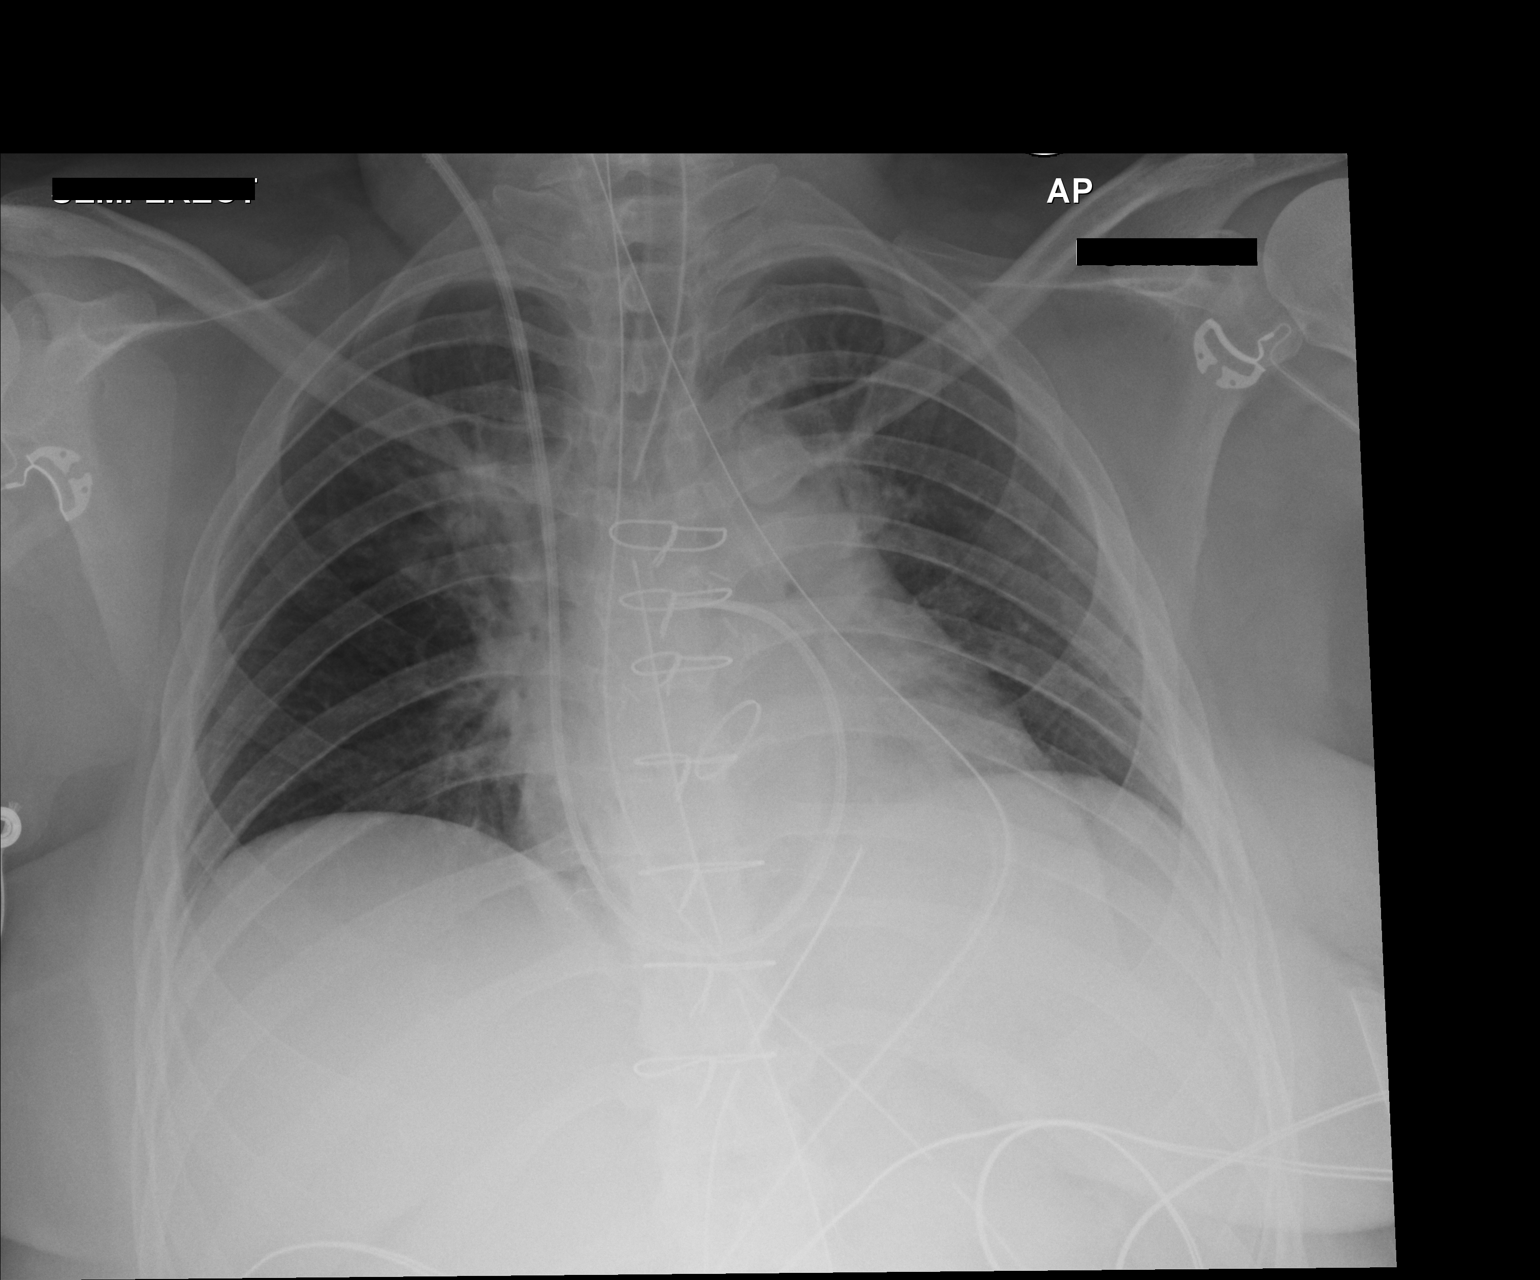

[1 of 1 positions shown; findings below may reference images not displayed]

FINDINGS: Endotracheal tube is in place with the tip in good position at the
level of the clavicular heads. Tip of right IJ approach Swan-Ganz
catheter is in the right main pulmonary artery. NG tube courses into
the stomach and below the inferior margin of the film. Chest tubes
are in place. Lung volumes are low but the lungs are clear. No
pneumothorax or pleural effusion.
IMPRESSION: Support apparatus a projects in good position. Negative for
pneumothorax or other acute abnormality.

## 2017-01-18 IMAGING — CR DG CHEST 1V PORT
1 series · 1 of 1 positions shown · non-contrast
Comparison: 02/14/2014.

CLINICAL DATA: Aortic valve replacement.

EXAM:
PORTABLE CHEST - 1 VIEW

[AP]
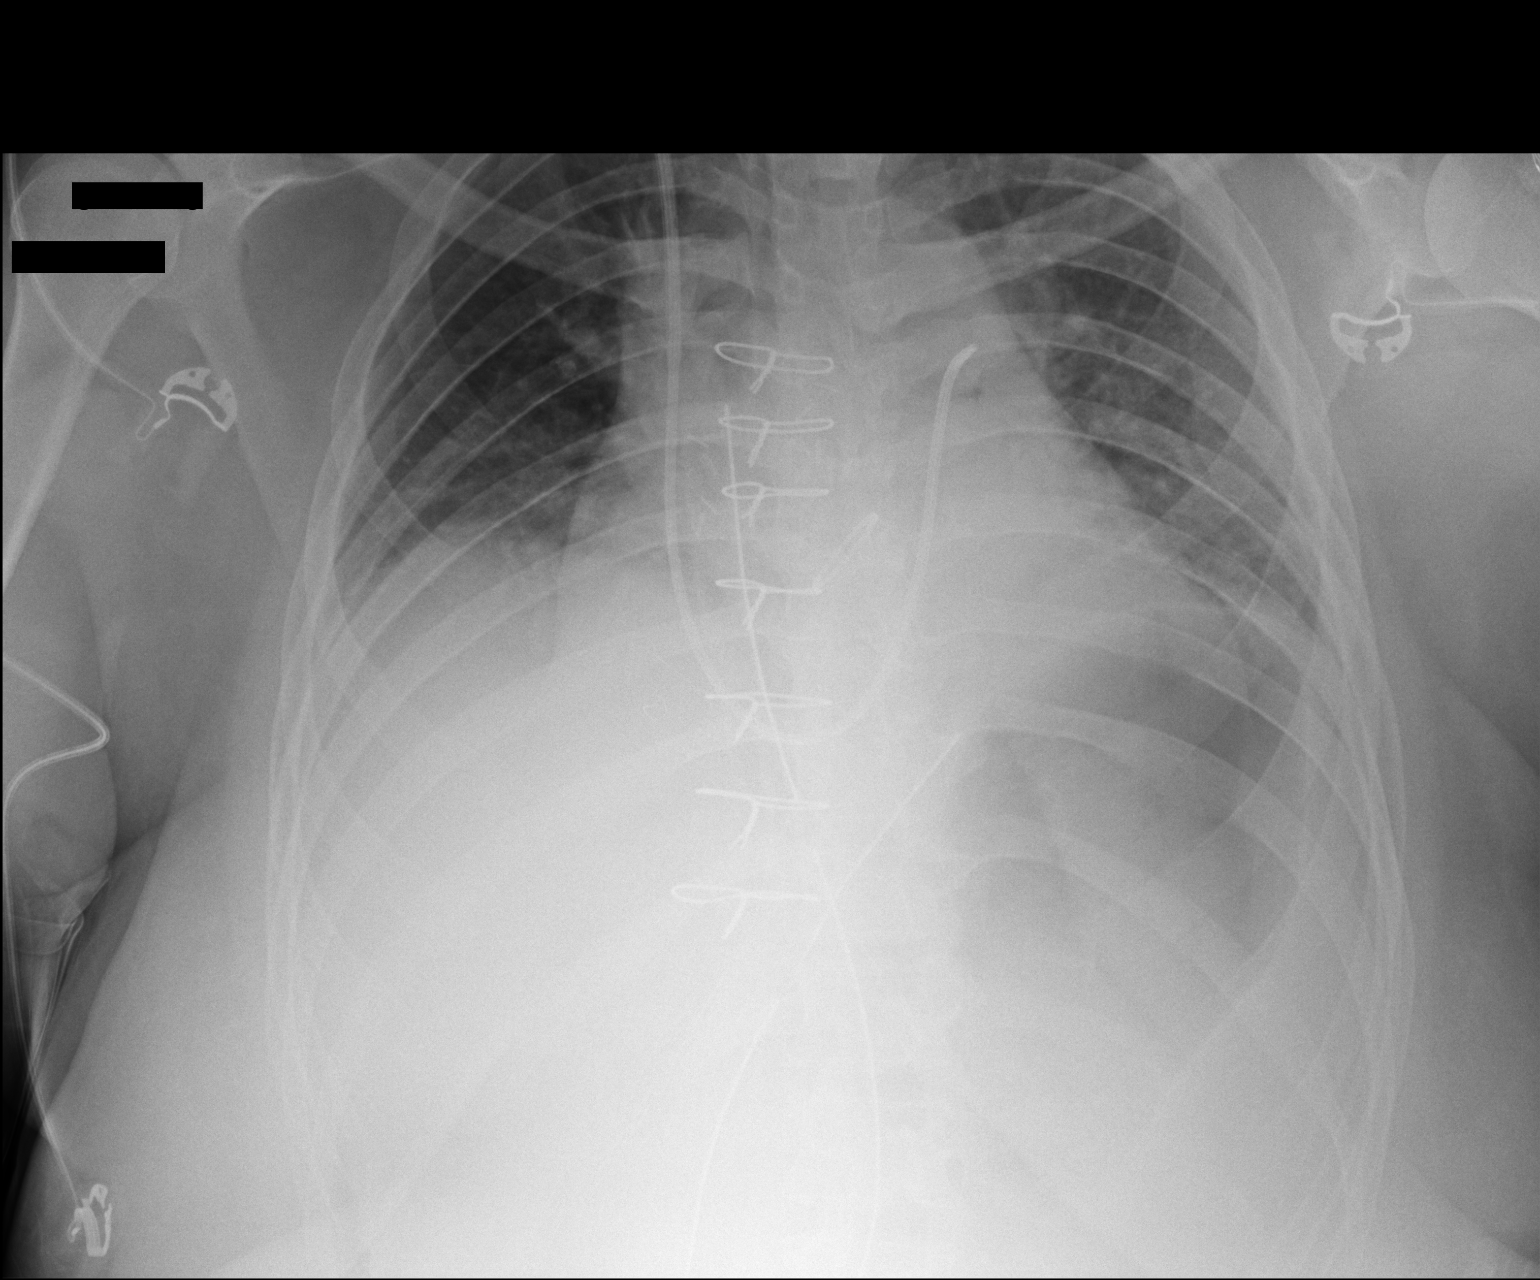

[1 of 1 positions shown; findings below may reference images not displayed]

FINDINGS: Interim extubation and removal of NG tube. Swan-Ganz catheter tip in
the left main pulmonary artery. Mediastinal and pericardial drainage
catheters in stable position. Stable cardiomegaly. Aortic valve
replacement . Low lung volumes with basilar atelectasis. No pleural
effusion or pneumothorax. Mild gastric distention.
IMPRESSION: 1. Interim extubation and removal NG tube.
2. Swan-Ganz catheter tip noted in the left main pulmonary artery.
Mediastinal and pericardial drainage catheters in stable position.
3. Stable cardiomegaly.  Prior aortic valve replacement.
4. Low lung volumes with mild bibasilar atelectasis.
5. Mild gastric distention.

## 2017-01-20 IMAGING — DX DG CHEST 2V
2 series · 2 of 2 positions shown · non-contrast
Comparison: 02/16/2014

CLINICAL DATA: Aortic valve replacement.  Soreness after surgery.

EXAM:
CHEST  2 VIEW

[chest pa]
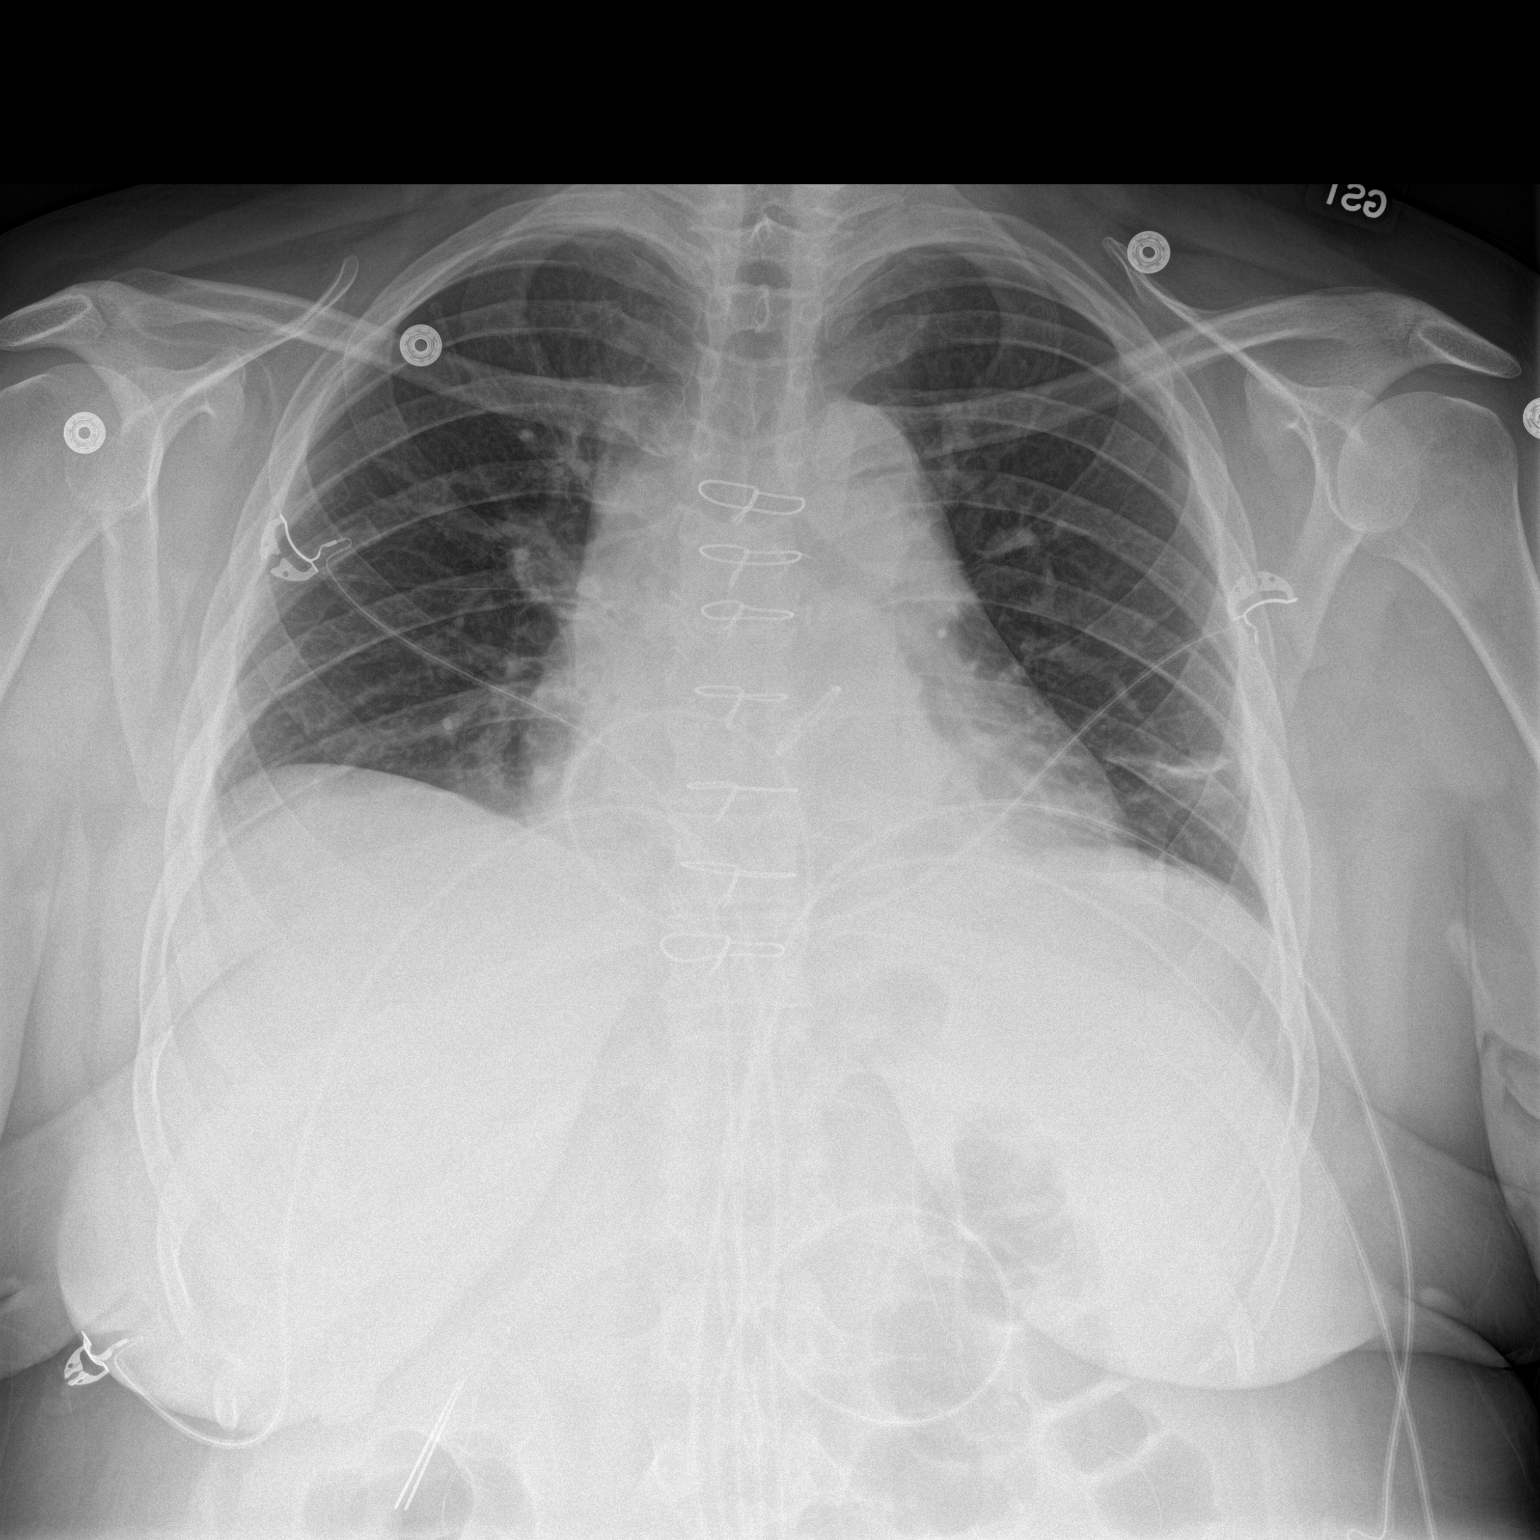

[chest lat]
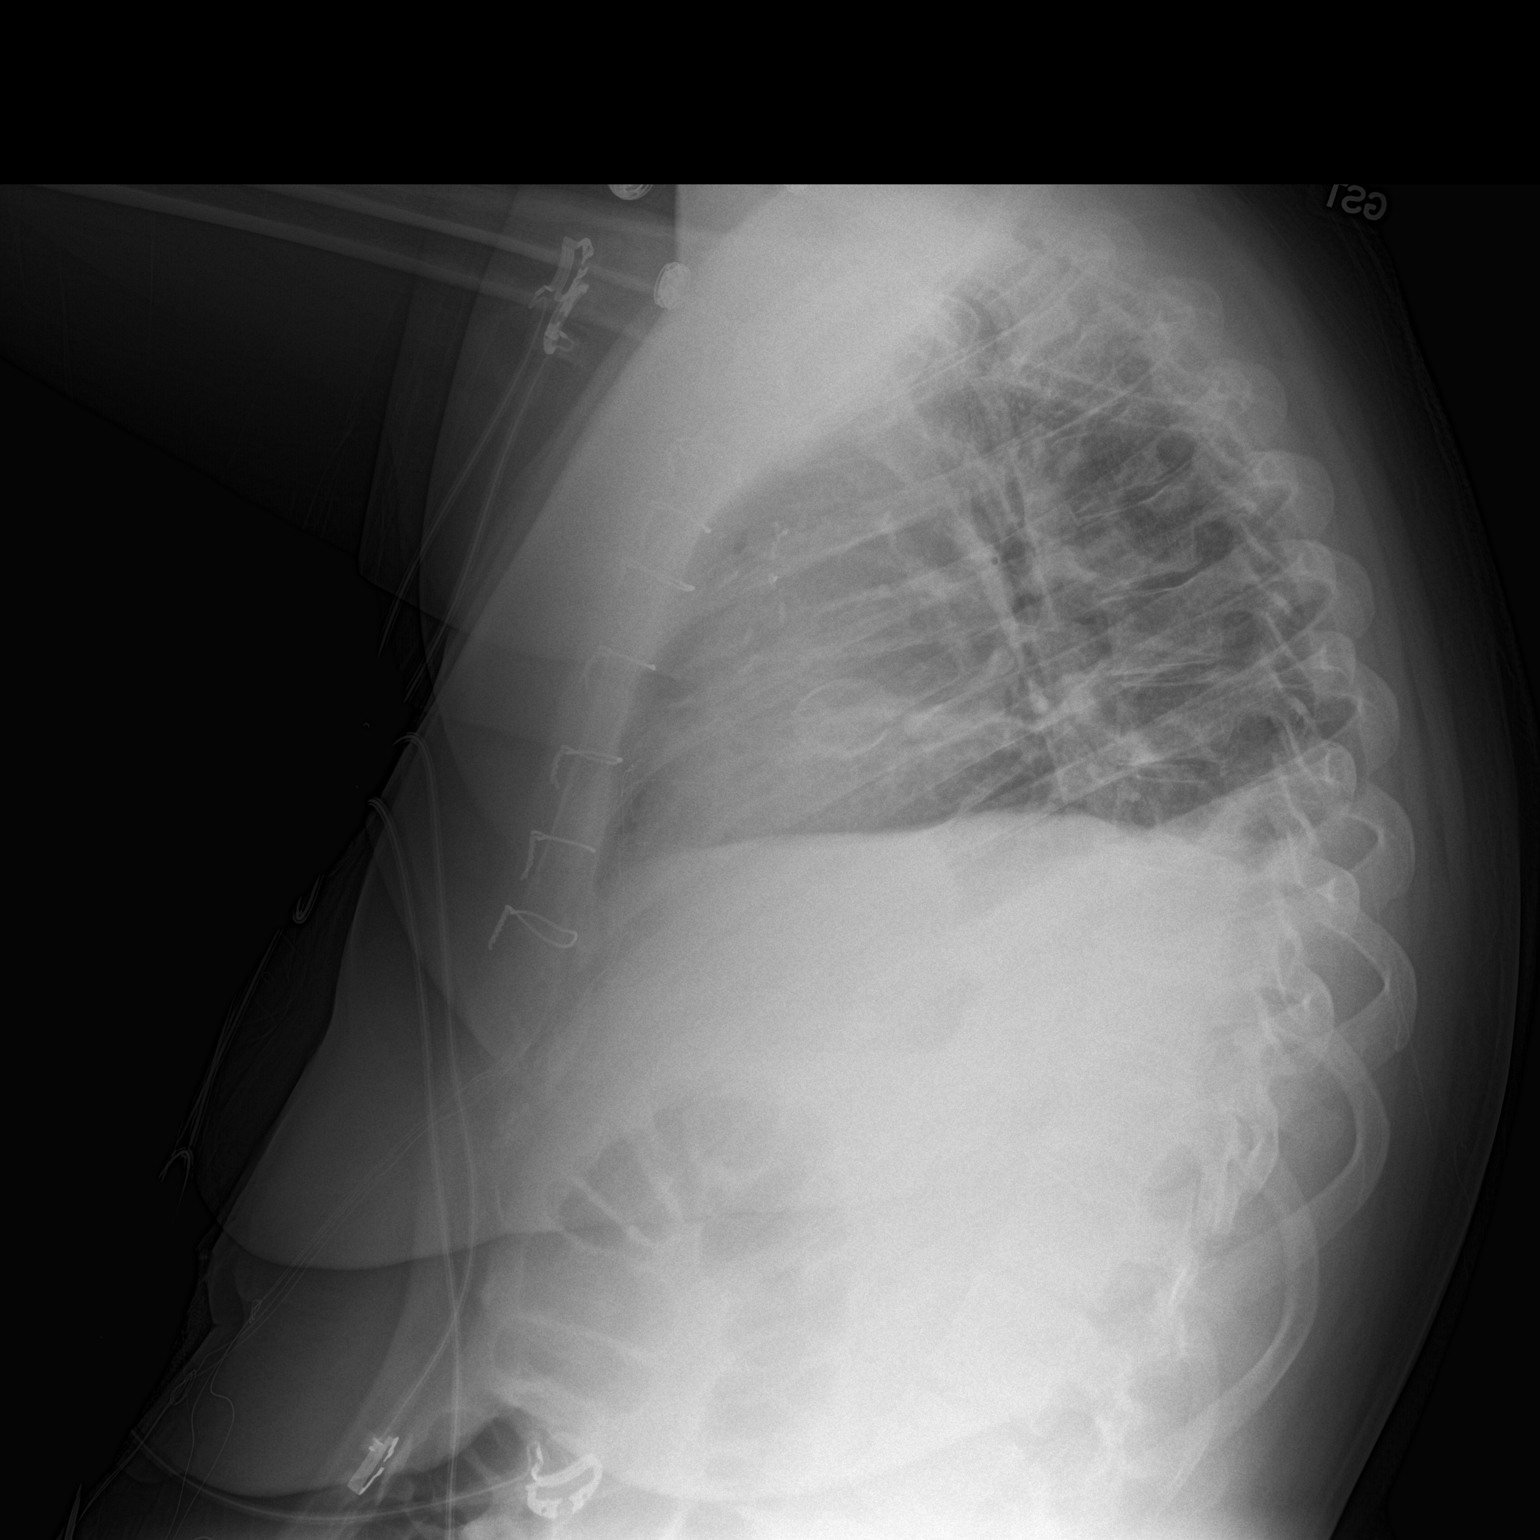

[2 of 2 positions shown; findings below may reference images not displayed]

FINDINGS: Stable heart size and aortic contours after aortic valve
replacement. There is shallow inspiration with bibasilar atelectasis
and trace effusions. No pulmonary edema. No pneumothorax. The
sternotomy wires are in line and intact.
IMPRESSION: Shallow inspiration with bibasilar atelectasis and small effusions.

## 2017-02-10 NOTE — Progress Notes (Signed)
CARDIOLOGY OFFICE NOTE  Date:  02/18/2017    [name] Date of Birth: 1970-04-21 Medical Record #[id number]  PCP:  Chevis Pretty, FNP  Cardiologist:  Peter Martinique  MD  No chief complaint on file.    History of Present Illness: Anna Salinas is a 47 y.o. female who presents for follow up AVR.   She has a history of AVR in 02/2014 for severe AS with a bicuspid AV. She is on chronic coumadin.  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 These were drained. Repeat CXR and Echo in April 2016 showed resolution of effusions and normal valve function.   In Dec 2017 she was complaining of fatigue with periods of exhaustion. No specific cardiac cause found. Felt to be related to poorly controlled DM with A1c 10.5%. She was started on insulin along with metformin.   Since then she reports symptoms of fatigue better. Last A1c 7%.  No edema, SOB, chest pain. She does note that she is having heavy uterine bleeding. A cryotherapy procedure was done without benefit. She is being considered for a hysterectomy.   Past Medical History:  Diagnosis Date  . Aortic stenosis due to bicuspid aortic valve   . Essential hypertension, benign   . Palpitations   . Type 2 diabetes mellitus ([place])     Past Surgical History:  Procedure Laterality Date  . AORTIC VALVE REPLACEMENT N/A 02/14/2014   Procedure: AORTIC VALVE REPLACEMENT (AVR);  Surgeon: Gaye Pollack, MD;  Location: [facility];  Service: Open Heart Surgery;  Laterality: N/A;  . CARDIAC CATHETERIZATION    . CESAREAN SECTION  2009  . CHEST TUBE INSERTION Right 03/27/2014   Procedure: CHEST TUBE INSERTION;  Surgeon: Grace Isaac, MD;  Location: [place];  Service: Thoracic;  Laterality: Right;  . INTRAOPERATIVE TRANSESOPHAGEAL ECHOCARDIOGRAM N/A 02/14/2014   Procedure: INTRAOPERATIVE TRANSESOPHAGEAL ECHOCARDIOGRAM;  Surgeon: Gaye Pollack, MD;  Location: [facility] OR;  Service: Open Heart Surgery;   Laterality: N/A;  . LEFT AND RIGHT HEART CATHETERIZATION WITH CORONARY ANGIOGRAM N/A 01/25/2014   Procedure: LEFT AND RIGHT HEART CATHETERIZATION WITH CORONARY ANGIOGRAM;  Surgeon: Peter M Martinique, MD;  Location: [facility] CATH LAB;  Service: Cardiovascular;  Laterality: N/A;  . PERICARDIAL FLUID DRAINAGE N/A 03/27/2014   Procedure: DRAINAGE OF PERICARDIAL FLUID;  Surgeon: Grace Isaac, MD;  Location: [facility];  Service: Thoracic;  Laterality: N/A;  . PLEURAL EFFUSION DRAINAGE Right 03/27/2014   Procedure: DRAINAGE OF PLEURAL EFFUSION;  Surgeon: Grace Isaac, MD;  Location: [facility];  Service: Thoracic;  Laterality: Right;  Drainage of right pleural effusion  . SUBXYPHOID PERICARDIAL WINDOW N/A 03/27/2014   Procedure: SUBXYPHOID PERICARDIAL WINDOW;  Surgeon: Grace Isaac, MD;  Location: [facility] OR;  Service: Thoracic;  Laterality: N/A;  . TUBAL LIGATION       Medications: Current Outpatient Medications  Medication Sig Dispense Refill  . glucose blood (ONETOUCH VERIO) test strip Use to check BG up to bid. 100 each 5  . hyoscyamine (LEVSIN SL) 0.125 MG SL tablet Place 1 tablet (0.125 mg total) under the tongue every 6 (six) hours as needed. 30 tablet 1  . Insulin Degludec (TRESIBA FLEXTOUCH) 200 UNIT/ML SOPN Inject 56 Units into the skin daily. 15 pen 1  . Insulin Pen Needle (DROPLET PEN NEEDLES) 32G X 4 MM MISC Use with insulin pen daily as directed 100 each 2  . metFORMIN (GLUCOPHAGE XR) 500 MG 24 hr tablet Take  2 tablets (1,000 mg total) by mouth 2 (two) times daily. 360 tablet 1  . ONETOUCH DELICA LANCETS FINE MISC Use to check BG up to bid 100 each 5  . ranitidine (ZANTAC) 150 MG tablet Take 1 tablet (150 mg total) by mouth daily. 90 tablet 1  . triamcinolone cream (KENALOG) 0.1 % Apply 1 application topically 2 (two) times daily. 453.6 g 1  . warfarin (COUMADIN) 5 MG tablet Take 1 tablet by mouth daily or as directed by coumadin clinic 90 tablet 3   No current facility-administered medications  for this visit.     Allergies: Allergies  Allergen Reactions  . Glipizide Nausea And Vomiting and Other (See Comments)    GI upset  . Metoprolol Other (See Comments)    "Made me sick"  . Shellfish Allergy Swelling    Throat and eyes were swollen.  Had difficulty breathing.  Was hospitalized.      Social History: The patient  reports that  has never smoked. she has never used smokeless tobacco. She reports that she does not drink alcohol or use drugs.   Family History: The patient's family history includes Brain cancer in her brother; Colon cancer in her maternal grandmother; Diabetes in her mother.   Review of Systems: Please see the history of present illness.  Glucose has been elevated.  All other systems are reviewed and negative.   Physical Exam: VS:  BP 115/76   Pulse 76   Ht 5\' 8"  (1.727 m)   Wt 234 lb 12.8 oz (106.5 kg)   BMI 35.70 kg/m  .  BMI Body mass index is 35.7 kg/m.  Wt Readings from Last 3 Encounters:  02/18/17 234 lb 12.8 oz (106.5 kg)  12/17/16 230 lb (104.3 kg)  09/26/16 232 lb (105.2 kg)    GENERAL:  Well appearing WF in NAD HEENT:  PERRL, EOMI, sclera are clear. Oropharynx is clear. NECK:  No jugular venous distention, carotid upstroke brisk and symmetric, no bruits, no thyromegaly or adenopathy LUNGS:  Clear to auscultation bilaterally CHEST:  Unremarkable HEART:  RRR,  PMI not displaced or sustained,S1 and good mechanical AV click. , no S3, no S4: no clicks, no rubs, no murmurs ABD:  Soft, nontender. BS +, no masses or bruits. No hepatomegaly, no splenomegaly EXT:  2 + pulses throughout, no edema, no cyanosis no clubbing SKIN:  Warm and dry.  No rashes NEURO:  Alert and oriented x 3. Cranial nerves II through XII intact. PSYCH:  Cognitively intact     LABORATORY DATA:  EKG:  EKG is not ordered today.     Lab Results  Component Value Date   WBC 5.6 01/03/2016   HGB 12.3 01/03/2016   HCT 40.2 01/03/2016   PLT 116 (L) 01/03/2016    GLUCOSE 153 (H) 12/17/2016   CHOL 145 12/17/2016   TRIG 100 12/17/2016   HDL 33 (L) 12/17/2016   LDLCALC 92 12/17/2016   ALT 29 12/17/2016   AST 34 12/17/2016   NA 139 12/17/2016   K 4.0 12/17/2016   CL 101 12/17/2016   CREATININE 0.66 12/17/2016   BUN 13 12/17/2016   CO2 22 12/17/2016   TSH 0.93 01/03/2016   INR 2.2 (H) 12/17/2016   HGBA1C 10.5 (H) 01/03/2016   MICROALBUR negative 04/14/2014   Last A1c Dec 2018 was 7.9%   BNP (last 3 results) No results for input(s): BNP in the last 8760 hours.  ProBNP (last 3 results) No results for input(s): PROBNP in  the last 8760 hours.   Other Studies Reviewed Today:   Echo: 03/26/2014 Study Conclusions - Left ventricle: The cavity size was normal. Wall thickness was normal. Systolic function was vigorous. The estimated ejection fraction was in the range of 65% to 70%. Wall motion was normal; there were no regional wall motion abnormalities. - Aortic valve: A [employer] mechanical prosthesis was present. No obvious perivalvular leak based on limited views. There was no significant regurgitation. Mean gradient (S): 15 mm Hg. Peak gradient (S): 28 mm Hg. - Right ventricle: The cavity size was below normal. - Tricuspid valve: There was trivial regurgitation. - Pulmonary arteries: Systolic pressure could not be accurately estimated. - Pericardium, extracardiac: A large pericardial effusion was identified circumferential to the heart, measures approximately 4 cm posteriorly and 3 cm anteriorly. There is compression of the right ventricle noted in the subcostal view and further evidence of tamponade physiology based on respiratory variation in mitral inflow. There was a right pleural effusion. Impressions: - Normal LV wall thickness with LVEF 65-70%. St. Jude mechanical AVR without obvious perivalvular leak based on limited and with grossly normal gradients. A large pericardial effusion  was identified circumferential to the heart, measures approximately 4 cm posteriorly and 3-4 cm anteriorly. There is compression of the right ventricle noted in the subcostal view and further evidence of tamponade physiology based on respiratory variation in mitral inflow. Right pleural effusion also noted. Results discussed with Dr. Servando Snare.  Assessment/Plan: 1. S/p AVR. Needs long term coumadin. Reviewed recommendations for SBE prophylaxis. I think her overall risk for hysterectomy is low. She would need to come off Coumadin for surgery but I don't think she would need bridging anticoagulation.   2. DM- poorly controlled but improving. Per primary care.   3. Chronic coumadin therapy. INR monitored by primary care.   Current medicines are reviewed with the patient today.  The patient does not have concerns regarding medicines other than what has been noted above.  The following changes have been made:  See above.  Labs/ tests ordered today include:    No orders of the defined types were placed in this encounter.    Disposition:   FU with me in 6 months.  Signed: Peter Martinique MD, [facility]    02/18/2017 4:29 PM

## 2017-02-18 ENCOUNTER — Ambulatory Visit (INDEPENDENT_AMBULATORY_CARE_PROVIDER_SITE_OTHER): Payer: BLUE CROSS/BLUE SHIELD | Admitting: Cardiology

## 2017-02-18 ENCOUNTER — Encounter: Payer: Self-pay | Admitting: Cardiology

## 2017-02-18 VITALS — BP 115/76 | HR 76 | Ht 68.0 in | Wt 234.8 lb

## 2017-02-18 DIAGNOSIS — Z952 Presence of prosthetic heart valve: Secondary | ICD-10-CM

## 2017-02-18 DIAGNOSIS — E119 Type 2 diabetes mellitus without complications: Secondary | ICD-10-CM

## 2017-02-18 DIAGNOSIS — I10 Essential (primary) hypertension: Secondary | ICD-10-CM

## 2017-02-18 NOTE — Patient Instructions (Addendum)
Continue your current therapy  I will see you in 6 months.   

## 2017-02-21 DIAGNOSIS — Z6836 Body mass index (BMI) 36.0-36.9, adult: Secondary | ICD-10-CM | POA: Diagnosis not present

## 2017-02-21 DIAGNOSIS — N938 Other specified abnormal uterine and vaginal bleeding: Secondary | ICD-10-CM | POA: Diagnosis not present

## 2017-03-28 DIAGNOSIS — Z30014 Encounter for initial prescription of intrauterine contraceptive device: Secondary | ICD-10-CM | POA: Diagnosis not present

## 2017-03-28 DIAGNOSIS — Z30430 Encounter for insertion of intrauterine contraceptive device: Secondary | ICD-10-CM | POA: Diagnosis not present

## 2017-04-01 ENCOUNTER — Ambulatory Visit: Payer: BLUE CROSS/BLUE SHIELD | Admitting: Nurse Practitioner

## 2017-04-01 ENCOUNTER — Encounter (INDEPENDENT_AMBULATORY_CARE_PROVIDER_SITE_OTHER): Payer: Self-pay

## 2017-04-01 ENCOUNTER — Encounter: Payer: Self-pay | Admitting: Nurse Practitioner

## 2017-04-01 VITALS — BP 125/80 | HR 77 | Temp 96.5°F | Ht 68.0 in | Wt 235.0 lb

## 2017-04-01 DIAGNOSIS — N3000 Acute cystitis without hematuria: Secondary | ICD-10-CM

## 2017-04-01 DIAGNOSIS — R350 Frequency of micturition: Secondary | ICD-10-CM | POA: Diagnosis not present

## 2017-04-01 DIAGNOSIS — J069 Acute upper respiratory infection, unspecified: Secondary | ICD-10-CM

## 2017-04-01 LAB — URINALYSIS, COMPLETE
Bilirubin, UA: NEGATIVE
Glucose, UA: NEGATIVE
Ketones, UA: NEGATIVE
Nitrite, UA: POSITIVE — AB
PH UA: 6 (ref 5.0–7.5)
Specific Gravity, UA: 1.02 (ref 1.005–1.030)
Urobilinogen, Ur: 0.2 mg/dL (ref 0.2–1.0)

## 2017-04-01 LAB — MICROSCOPIC EXAMINATION
Renal Epithel, UA: NONE SEEN /hpf
WBC, UA: >30 /hpf — AB (ref 0–?)

## 2017-04-01 MED ORDER — CIPROFLOXACIN HCL 500 MG PO TABS: 500.0000 mg | ORAL_TABLET | Freq: Two times a day (BID) | ORAL | 0 refills | Status: DC

## 2017-04-01 NOTE — Addendum Note (Signed)
Addended by: Chevis Pretty on: 04/01/2017 10:00 AM   Modules accepted: Orders

## 2017-04-01 NOTE — Progress Notes (Addendum)
   Subjective:    Patient ID: ANVI MANGAL, female    DOB: 10-15-1970, 47 y.o.   MRN: [id number]  HPI Patient comes in today c/o sore throat and congestion. Started at end of last week. Some better today.    Review of Systems  Constitutional: Positive for fatigue. Negative for appetite change, chills and fever.  HENT: Positive for congestion, rhinorrhea and sore throat. Negative for ear pain and trouble swallowing.   Respiratory: Negative for cough.   Genitourinary: Negative.   Neurological: Positive for headaches.  Psychiatric/Behavioral: Negative.   All other systems reviewed and are negative.      Objective:   Physical Exam  Constitutional: She is oriented to person, place, and time. She appears well-developed and well-nourished. No distress.  HENT:  Right Ear: Hearing, tympanic membrane, external ear and ear canal normal.  Left Ear: Hearing, tympanic membrane, external ear and ear canal normal.  Nose: Mucosal edema and rhinorrhea present. Right sinus exhibits no maxillary sinus tenderness and no frontal sinus tenderness. Left sinus exhibits no maxillary sinus tenderness and no frontal sinus tenderness.  Mouth/Throat: Uvula is midline and oropharynx is clear and moist.  Neck: Normal range of motion. Neck supple.  Cardiovascular: Normal rate and regular rhythm.  Pulmonary/Chest: Effort normal and breath sounds normal.  Abdominal: Soft. Bowel sounds are normal. She exhibits no distension. There is no tenderness.  Genitourinary:  Genitourinary Comments: No CVA tenderness  Neurological: She is alert and oriented to person, place, and time.  Skin: Skin is warm.  Psychiatric: She has a normal mood and affect. Her behavior is normal. Judgment and thought content normal.   BP 125/80   Pulse 77   Temp (!) 96.5 F (35.8 C) (Oral)   Ht 5\' 8"  (1.727 m)   Wt 235 lb (106.6 kg)   BMI 35.73 kg/m    UA (+)nitrites       Assessment & Plan:  1. Frequent urination Force  fluids - Urinalysis, Complete  2. Upper respiratory tract infection, unspecified type 1. Take meds as prescribed 2. Use a cool mist humidifier especially during the winter months and when heat has been humid. 3. Use saline nose sprays frequently 4. Saline irrigations of the nose can be very helpful if done frequently.  * 4X daily for 1 week*  * Use of a nettie pot can be helpful with this. Follow directions with this* 5. Drink plenty of fluids 6. Keep thermostat turn down low 7.For any cough or congestion  Use plain Mucinex- regular strength or max strength is fine   * Children- consult with Pharmacist for dosing 8. For fever or aces or pains- take tylenol or ibuprofen appropriate for age and weight.  * for fevers greater than 101 orally you may alternate ibuprofen and tylenol every  3 hours.    3. UTI Take medication as prescribe [facility] underwear Take shower not bath Cranberry juice, yogurt Force fluids AZO over the counter X2 days Culture pending RTO prn - cipro 500mg  1 po bid for 5 days- #10  Mary-Margaret Hassell Done, FNP

## 2017-04-01 NOTE — Patient Instructions (Signed)

## 2017-04-04 LAB — URINE CULTURE

## 2017-05-26 ENCOUNTER — Other Ambulatory Visit: Payer: Self-pay | Admitting: Nurse Practitioner

## 2017-05-26 DIAGNOSIS — Z7901 Long term (current) use of anticoagulants: Secondary | ICD-10-CM

## 2017-05-29 ENCOUNTER — Ambulatory Visit: Payer: BLUE CROSS/BLUE SHIELD | Admitting: Nurse Practitioner

## 2017-05-31 ENCOUNTER — Inpatient Hospital Stay (HOSPITAL_COMMUNITY)
Admission: EM | Admit: 2017-05-31 | Discharge: 2017-06-02 | DRG: 812 | Disposition: A | Discharge status: Home / Self Care | Payer: BLUE CROSS/BLUE SHIELD | Attending: Family Medicine | Admitting: Family Medicine

## 2017-05-31 ENCOUNTER — Emergency Department (HOSPITAL_COMMUNITY): Payer: BLUE CROSS/BLUE SHIELD

## 2017-05-31 ENCOUNTER — Other Ambulatory Visit: Payer: Self-pay

## 2017-05-31 ENCOUNTER — Encounter (HOSPITAL_COMMUNITY): Payer: Self-pay | Admitting: Emergency Medicine

## 2017-05-31 DIAGNOSIS — Z952 Presence of prosthetic heart valve: Secondary | ICD-10-CM | POA: Diagnosis not present

## 2017-05-31 DIAGNOSIS — N938 Other specified abnormal uterine and vaginal bleeding: Secondary | ICD-10-CM | POA: Diagnosis present

## 2017-05-31 DIAGNOSIS — I10 Essential (primary) hypertension: Secondary | ICD-10-CM | POA: Diagnosis present

## 2017-05-31 DIAGNOSIS — D696 Thrombocytopenia, unspecified: Secondary | ICD-10-CM | POA: Diagnosis not present

## 2017-05-31 DIAGNOSIS — Z7901 Long term (current) use of anticoagulants: Secondary | ICD-10-CM

## 2017-05-31 DIAGNOSIS — Z79899 Other long term (current) drug therapy: Secondary | ICD-10-CM

## 2017-05-31 DIAGNOSIS — Z975 Presence of (intrauterine) contraceptive device: Secondary | ICD-10-CM | POA: Diagnosis not present

## 2017-05-31 DIAGNOSIS — Z888 Allergy status to other drugs, medicaments and biological substances status: Secondary | ICD-10-CM

## 2017-05-31 DIAGNOSIS — Z91013 Allergy to seafood: Secondary | ICD-10-CM

## 2017-05-31 DIAGNOSIS — Z833 Family history of diabetes mellitus: Secondary | ICD-10-CM | POA: Diagnosis not present

## 2017-05-31 DIAGNOSIS — Z794 Long term (current) use of insulin: Secondary | ICD-10-CM | POA: Diagnosis not present

## 2017-05-31 DIAGNOSIS — R748 Abnormal levels of other serum enzymes: Secondary | ICD-10-CM | POA: Diagnosis present

## 2017-05-31 DIAGNOSIS — D649 Anemia, unspecified: Secondary | ICD-10-CM | POA: Diagnosis not present

## 2017-05-31 DIAGNOSIS — R079 Chest pain, unspecified: Secondary | ICD-10-CM | POA: Diagnosis not present

## 2017-05-31 DIAGNOSIS — E119 Type 2 diabetes mellitus without complications: Secondary | ICD-10-CM

## 2017-05-31 DIAGNOSIS — R0602 Shortness of breath: Secondary | ICD-10-CM | POA: Diagnosis not present

## 2017-05-31 LAB — PROTIME-INR
INR: 2.48
Prothrombin Time: 26.7 seconds — ABNORMAL HIGH (ref 11.4–15.2)

## 2017-05-31 LAB — BASIC METABOLIC PANEL
Anion gap: 8 (ref 5–15)
BUN: 9 mg/dL (ref 6–20)
CALCIUM: 8.8 mg/dL — AB (ref 8.9–10.3)
CO2: 25 mmol/L (ref 22–32)
Chloride: 101 mmol/L (ref 101–111)
Creatinine, Ser: 0.63 mg/dL (ref 0.44–1.00)
GFR calc non Af Amer: >60 mL/min (ref >60)
Glucose, Bld: 306 mg/dL — ABNORMAL HIGH (ref 65–99)
Potassium: 4 mmol/L (ref 3.5–5.1)
SODIUM: 134 mmol/L — AB (ref 135–145)

## 2017-05-31 LAB — CBC
HCT: 24 % — ABNORMAL LOW (ref 36.0–46.0)
HEMOGLOBIN: 6.6 g/dL — AB (ref 12.0–15.0)
MCH: 19.5 pg — ABNORMAL LOW (ref 26.0–34.0)
MCHC: 27.5 g/dL — ABNORMAL LOW (ref 30.0–36.0)
MCV: 71 fL — ABNORMAL LOW (ref 78.0–100.0)
PLATELETS: 95 10*3/uL — AB (ref 150–400)
RBC: 3.38 MIL/uL — AB (ref 3.87–5.11)
RDW: 17.9 % — ABNORMAL HIGH (ref 11.5–15.5)
WBC: 6 10*3/uL (ref 4.0–10.5)

## 2017-05-31 LAB — BRAIN NATRIURETIC PEPTIDE: B NATRIURETIC PEPTIDE 5: 84 pg/mL (ref 0.0–100.0)

## 2017-05-31 LAB — TROPONIN I: TROPONIN I: 0.03 ng/mL — AB (ref <0.03)

## 2017-05-31 LAB — PREPARE RBC (CROSSMATCH)

## 2017-05-31 LAB — ABO/RH: ABO/RH(D): A POS

## 2017-05-31 LAB — HCG, QUANTITATIVE, PREGNANCY

## 2017-05-31 MED ORDER — HYDROCODONE-ACETAMINOPHEN 5-325 MG PO TABS: 1.0000 | ORAL_TABLET | ORAL | Status: DC | PRN

## 2017-05-31 MED ORDER — SODIUM CHLORIDE 0.9 % IV SOLN: 250.0000 mL | INTRAVENOUS | Status: DC | PRN

## 2017-05-31 MED ORDER — FAMOTIDINE 20 MG PO TABS
20.0000 mg | ORAL_TABLET | Freq: Every day | ORAL | Status: DC
  Administered 2017-06-01 – 2017-06-02 (×2): 20 mg via ORAL
  Filled 2017-05-31 (×2): qty 1

## 2017-05-31 MED ORDER — INSULIN ASPART 100 UNIT/ML ~~LOC~~ SOLN
0.0000 [IU] | Freq: Every day | SUBCUTANEOUS | Status: DC
  Administered 2017-06-01: 2 [IU] via SUBCUTANEOUS
  Administered 2017-06-01: 3 [IU] via SUBCUTANEOUS

## 2017-05-31 MED ORDER — SODIUM CHLORIDE 0.9% FLUSH
3.0000 mL | Freq: Two times a day (BID) | INTRAVENOUS | Status: DC
  Administered 2017-06-01 – 2017-06-02 (×4): 3 mL via INTRAVENOUS

## 2017-05-31 MED ORDER — SODIUM CHLORIDE 0.9% FLUSH: 3.0000 mL | INTRAVENOUS | Status: DC | PRN

## 2017-05-31 MED ORDER — SODIUM CHLORIDE 0.9 % IV SOLN
Freq: Once | INTRAVENOUS | Status: AC
  Administered 2017-05-31: via INTRAVENOUS

## 2017-05-31 MED ORDER — ONDANSETRON HCL 4 MG/2ML IJ SOLN: 4.0000 mg | Freq: Four times a day (QID) | INTRAMUSCULAR | Status: DC | PRN

## 2017-05-31 MED ORDER — INSULIN GLARGINE 100 UNIT/ML ~~LOC~~ SOLN
40.0000 [IU] | Freq: Every day | SUBCUTANEOUS | Status: DC
  Administered 2017-06-01: 40 [IU] via SUBCUTANEOUS
  Filled 2017-05-31 (×2): qty 0.4

## 2017-05-31 MED ORDER — ACETAMINOPHEN 650 MG RE SUPP: 650.0000 mg | Freq: Four times a day (QID) | RECTAL | Status: DC | PRN

## 2017-05-31 MED ORDER — ONDANSETRON HCL 4 MG PO TABS: 4.0000 mg | ORAL_TABLET | Freq: Four times a day (QID) | ORAL | Status: DC | PRN

## 2017-05-31 MED ORDER — HYOSCYAMINE SULFATE 0.125 MG SL SUBL: 0.1250 mg | SUBLINGUAL_TABLET | Freq: Four times a day (QID) | SUBLINGUAL | Status: DC | PRN

## 2017-05-31 MED ORDER — INSULIN ASPART 100 UNIT/ML ~~LOC~~ SOLN
0.0000 [IU] | Freq: Three times a day (TID) | SUBCUTANEOUS | Status: DC
  Administered 2017-06-01 (×3): 2 [IU] via SUBCUTANEOUS
  Administered 2017-06-02: 3 [IU] via SUBCUTANEOUS

## 2017-05-31 MED ORDER — ACETAMINOPHEN 325 MG PO TABS
650.0000 mg | ORAL_TABLET | Freq: Four times a day (QID) | ORAL | Status: DC | PRN
  Administered 2017-06-01: 650 mg via ORAL
  Filled 2017-05-31: qty 2

## 2017-05-31 NOTE — ED Triage Notes (Signed)
Patient complaining of swelling in lower legs x 1-2 weeks and bilateral arm pain and left jaw pain starting this morning.

## 2017-05-31 NOTE — ED Notes (Signed)
CRITICAL VALUE ALERT  Critical Value:  Hemoglobin 6.6  Date & Time Notied:  05/31/17 2220  Provider Notified: [email or web address]  Orders Received/Actions taken: will put in orders

## 2017-05-31 NOTE — ED Provider Notes (Addendum)
[facility] EMERGENCY DEPARTMENT Provider Note   CSN: [id number] Arrival date & time: 05/31/17  2118     History   Chief Complaint Chief Complaint  Patient presents with  . Shortness of Breath    HPI Anna Salinas is a 47 y.o. female.complains of shortness of breath worse with exertion and improved with rest for the past 2 weeks. Symptoms accompanied by bilateral arm pain and some swelling of her legs. She denies any jaw pain denies any chest pain. Presently has mild left arm pain. Denies other associated symptoms.she does report that she had very heavy vaginal bleeding 3 weeks ago after her Mirena was inserted. No other associated symptoms.  HPI  Past Medical History:  Diagnosis Date  . Aortic stenosis due to bicuspid aortic valve   . Essential hypertension, benign   . Palpitations   . Type 2 diabetes mellitus [facility])     Patient Active Problem List   Diagnosis Date Noted  . S/P AVR (aortic valve replacement) 06/09/2014  . Diabetes mellitus type II, non insulin dependent ([place])   . Long term current use of anticoagulant therapy 02/21/2014  . Murmur 11/28/2010  . BMI 34.0-34.9,adult 11/28/2010  . Essential hypertension, benign 11/28/2010    Past Surgical History:  Procedure Laterality Date  . AORTIC VALVE REPLACEMENT N/A 02/14/2014   Procedure: AORTIC VALVE REPLACEMENT (AVR);  Surgeon: Gaye Pollack, MD;  Location: [place];  Service: Open Heart Surgery;  Laterality: N/A;  . CARDIAC CATHETERIZATION    . CESAREAN SECTION  2009  . CHEST TUBE INSERTION Right 03/27/2014   Procedure: CHEST TUBE INSERTION;  Surgeon: Grace Isaac, MD;  Location: [place];  Service: Thoracic;  Laterality: Right;  . INTRAOPERATIVE TRANSESOPHAGEAL ECHOCARDIOGRAM N/A 02/14/2014   Procedure: INTRAOPERATIVE TRANSESOPHAGEAL ECHOCARDIOGRAM;  Surgeon: Gaye Pollack, MD;  Location: [facility] OR;  Service: Open Heart Surgery;  Laterality: N/A;  . LEFT AND RIGHT HEART CATHETERIZATION WITH CORONARY ANGIOGRAM N/A  01/25/2014   Procedure: LEFT AND RIGHT HEART CATHETERIZATION WITH CORONARY ANGIOGRAM;  Surgeon: Peter M Martinique, MD;  Location: [facility] CATH LAB;  Service: Cardiovascular;  Laterality: N/A;  . PERICARDIAL FLUID DRAINAGE N/A 03/27/2014   Procedure: DRAINAGE OF PERICARDIAL FLUID;  Surgeon: Grace Isaac, MD;  Location: [place];  Service: Thoracic;  Laterality: N/A;  . PLEURAL EFFUSION DRAINAGE Right 03/27/2014   Procedure: DRAINAGE OF PLEURAL EFFUSION;  Surgeon: Grace Isaac, MD;  Location: [place];  Service: Thoracic;  Laterality: Right;  Drainage of right pleural effusion  . SUBXYPHOID PERICARDIAL WINDOW N/A 03/27/2014   Procedure: SUBXYPHOID PERICARDIAL WINDOW;  Surgeon: Grace Isaac, MD;  Location: [facility] OR;  Service: Thoracic;  Laterality: N/A;  . TUBAL LIGATION       OB History   None      Home Medications    Prior to Admission medications   Medication Sig Start Date End Date Taking? Authorizing Provider  ciprofloxacin (CIPRO) 500 MG tablet Take 1 tablet (500 mg total) by mouth 2 (two) times daily. 04/01/17   Hassell Done, Mary-Margaret, FNP  glucose blood (ONETOUCH VERIO) test strip Use to check BG up to bid. 11/30/14   Cherre Robins, PharmD  hyoscyamine (LEVSIN SL) 0.125 MG SL tablet Place 1 tablet (0.125 mg total) under the tongue every 6 (six) hours as needed. 12/20/15   Hassell Done, Mary-Margaret, FNP  Insulin Degludec (TRESIBA FLEXTOUCH) 200 UNIT/ML SOPN Inject 56 Units into the skin daily. 12/17/16   Hassell Done, Mary-Margaret, FNP  Insulin Pen Needle (DROPLET PEN  NEEDLES) 32G X 4 MM MISC Use with insulin pen daily as directed 09/26/16   Hassell Done, Mary-Margaret, FNP  metFORMIN (GLUCOPHAGE XR) 500 MG 24 hr tablet Take 2 tablets (1,000 mg total) by mouth 2 (two) times daily. 09/26/16   Chevis Pretty, FNP  [facility] DELICA LANCETS FINE MISC Use to check BG up to bid 11/30/14   Cherre Robins, PharmD  ranitidine (ZANTAC) 150 MG tablet Take 1 tablet (150 mg total) by mouth daily. 09/26/16   Hassell Done  Mary-Margaret, FNP  triamcinolone cream (KENALOG) 0.1 % Apply 1 application topically 2 (two) times daily. 09/26/16   Hassell Done Mary-Margaret, FNP  warfarin (COUMADIN) 5 MG tablet TAKE 1 TABLET BY MOUTH DAILY OR AS DIRECTED BY COUMADIN CLINIC 05/26/17   Chevis Pretty, FNP    Family History Family History  Problem Relation Age of Onset  . Diabetes Mother   . Colon cancer Maternal Grandmother   . Brain cancer Brother     Social History Social History   Tobacco Use  . Smoking status: Never Smoker  . Smokeless tobacco: Never Used  Substance Use Topics  . Alcohol use: No    Alcohol/week: 0.0 oz  . Drug use: No     Allergies   Glipizide; Metoprolol; and Shellfish allergy   Review of Systems Review of Systems  Constitutional: Negative.   HENT: Negative.   Respiratory: Positive for shortness of breath.   Cardiovascular: Negative.   Gastrointestinal: Negative.   Musculoskeletal: Positive for myalgias.       Bilateral arm pain  Skin: Negative.   Neurological: Positive for weakness.       Generalized weakness  Hematological: Bruises/bleeds easily.  Psychiatric/Behavioral: Negative.   All other systems reviewed and are negative.    Physical Exam Updated Vital Signs BP 123/67   Pulse 89   Temp (!) 97.4 F (36.3 C) (Oral)   Resp 18   Ht 5\' 8"  (1.727 m)   Wt 102.1 kg (225 lb)   LMP 04/14/2017   SpO2 99%   BMI 34.21 kg/m   Physical Exam  Constitutional: She appears well-developed and well-nourished.  HENT:  Head: Normocephalic and atraumatic.  Eyes: Pupils are equal, round, and reactive to light. Conjunctivae are normal.  Conjunctiva pale  Neck: Neck supple. No tracheal deviation present. No thyromegaly present.  Cardiovascular: Normal rate, regular rhythm and intact distal pulses.  No murmur heard. Pulmonary/Chest: Effort normal and breath sounds normal.  Abdominal: Soft. Bowel sounds are normal. She exhibits no distension. There is no tenderness.    Musculoskeletal: Normal range of motion. She exhibits no edema or tenderness.  Neurological: She is alert. Coordination normal.  Skin: Skin is warm and dry. Capillary refill takes less than 2 seconds. No rash noted.  Psychiatric: She has a normal mood and affect.  Nursing note and vitals reviewed.    ED Treatments / Results  Labs (all labs ordered are listed, but only abnormal results are displayed) Labs Reviewed  CBC - Abnormal; Notable for the following components:      Result Value   RBC 3.38 (*)    Hemoglobin 6.6 (*)    HCT 24.0 (*)    MCV 71.0 (*)    MCH 19.5 (*)    MCHC 27.5 (*)    RDW 17.9 (*)    Platelets 95 (*)    All other components within normal limits  PROTIME-INR - Abnormal; Notable for the following components:   Prothrombin Time 26.7 (*)    All other components within  normal limits  BASIC METABOLIC PANEL  TROPONIN I  HCG, QUANTITATIVE, PREGNANCY  BRAIN NATRIURETIC PEPTIDE  PREPARE RBC (CROSSMATCH)  TYPE AND SCREEN    EKG EKG Interpretation  Date/Time:  Saturday May 31 2017 21:22:04 EDT Ventricular Rate:  99 PR Interval:  162 QRS Duration: 70 QT Interval:  344 QTC Calculation: 441 R Axis:   38 Text Interpretation:  Normal sinus rhythm Nonspecific ST and T wave abnormality Abnormal ECG No significant change since last tracing Confirmed by Orlie Dakin [phone]) on 05/31/2017 9:42:34 PM  Chest x-ray viewed by me Results for orders placed or performed during the hospital encounter of 78/93/81  Basic metabolic panel  Result Value Ref Range   Sodium 134 (L) 135 - 145 mmol/L   Potassium 4.0 3.5 - 5.1 mmol/L   Chloride 101 101 - 111 mmol/L   CO2 25 22 - 32 mmol/L   Glucose, Bld 306 (H) 65 - 99 mg/dL   BUN 9 6 - 20 mg/dL   Creatinine, Ser 0.63 0.44 - 1.00 mg/dL   Calcium 8.8 (L) 8.9 - 10.3 mg/dL   GFR calc non Af Amer >60 >60 mL/min   GFR calc Af Amer >60 >60 mL/min   Anion gap 8 5 - 15  CBC  Result Value Ref Range   WBC 6.0 4.0 - 10.5 K/uL    RBC 3.38 (L) 3.87 - 5.11 MIL/uL   Hemoglobin 6.6 (LL) 12.0 - 15.0 g/dL   HCT 24.0 (L) 36.0 - 46.0 %   MCV 71.0 (L) 78.0 - 100.0 fL   MCH 19.5 (L) 26.0 - 34.0 pg   MCHC 27.5 (L) 30.0 - 36.0 g/dL   RDW 17.9 (H) 11.5 - 15.5 %   Platelets 95 (L) 150 - 400 K/uL  Troponin I  Result Value Ref Range   Troponin I 0.03 (HH) <0.03 ng/mL  hCG, quantitative, pregnancy  Result Value Ref Range   hCG, Beta Chain, Quant, S <1 <5 mIU/mL  Brain natriuretic peptide  Result Value Ref Range   B Natriuretic Peptide 84.0 0.0 - 100.0 pg/mL  Protime-INR  Result Value Ref Range   Prothrombin Time 26.7 (H) 11.4 - 15.2 seconds   INR 2.48   Prepare RBC  Result Value Ref Range   Order Confirmation      ORDER PROCESSED BY BLOOD BANK Performed at [facility], [address] [place], [place] [address]    Dg Chest 2 View  Result Date: 05/31/2017 CLINICAL DATA:  Left side chest pain. EXAM: CHEST - 2 VIEW COMPARISON:  05/29/2015 FINDINGS: Prior CABG and valve replacement. Heart and mediastinal contours are within normal limits. No focal opacities or effusions. No acute bony abnormality. IMPRESSION: No active cardiopulmonary disease. Electronically Signed   By: Rolm Baptise M.D.   On: 05/31/2017 22:30   Radiology Dg Chest 2 View  Result Date: 05/31/2017 CLINICAL DATA:  Left side chest pain. EXAM: CHEST - 2 VIEW COMPARISON:  05/29/2015 FINDINGS: Prior CABG and valve replacement. Heart and mediastinal contours are within normal limits. No focal opacities or effusions. No acute bony abnormality. IMPRESSION: No active cardiopulmonary disease. Electronically Signed   By: Rolm Baptise M.D.   On: 05/31/2017 22:30    Procedures Procedures (including critical care time)  Medications Ordered in ED Medications  0.9 %  sodium chloride infusion (has no administration in time range)     Initial Impression / Assessment and Plan / ED Course  I have reviewed the triage vital signs and the nursing  notes.  Pertinent labs  & imaging results that were available during my care of the patient were reviewed by me and considered in my medical decision making (see chart for details).     Mildly elevated troponin likely secondary to demand ischemia.symptoms are concerning for angina.INR shows that Patient is therapeutic on warfarin.she is markedly anemic. Likely the cause of her symptoms. Dr. Myna Hidalgo consulted and arrange for admission patient typed and crossmatched for 2 units packed red cells to be transfused immediately.  Final Clinical Impressions(s) / ED Diagnoses  Diagnosis symptomatic anemia Final diagnoses:  None   CRITICAL CARE Performed by: Orlie Dakin Total critical care time: 30 minutes Critical care time was exclusive of separately billable procedures and treating other patients. Critical care was necessary to treat or prevent imminent or life-threatening deterioration. Critical care was time spent personally by me on the following activities: development of treatment plan with patient and/or surrogate as well as nursing, discussions with consultants, evaluation of patient's response to treatment, examination of patient, obtaining history from patient or surrogate, ordering and performing treatments and interventions, ordering and review of laboratory studies, ordering and review of radiographic studies, pulse oximetry and re-evaluation of patient's condition. ED Discharge Orders    None       Orlie Dakin, MD 05/31/17 3810    Orlie Dakin, MD 05/31/17 [phone]

## 2017-05-31 NOTE — H&P (Signed)
History and Physical    Anna Salinas BSW:[id number] DOB: Dec 25, 1970 DOA: 05/31/2017  PCP: Chevis Pretty, FNP   Patient coming from: Home  Chief Complaint: Exertional dyspnea, fatigue, arm ache   HPI: Anna Salinas is a 47 y.o. female with medical history significant for aortic valve replacement on warfarin, insulin-dependent diabetes mellitus, and dysfunctional uterine bleeding treated with Mirena approximately 3 weeks ago, now presenting to the emergency department with fatigue, exertional dyspnea, and right arm ache.  Patient reports a long history of very heavy and long menstrual periods, requiring blood transfusion in 2016, followed by gynecology and treated with Mirena IUD approximately 3 weeks ago.  She reports very heavy bleeding for about 2 weeks after insertion of IUD, but reports that there has been no bleeding in the past 1 week.  She complains of exertional dyspnea for the past 2 weeks or so, in addition to fatigue.  She was cooking this evening when she developed an ache in the bilateral arms, particularly the right arm.  Left arm ache resolved by the time of arrival in the hospital the right arm ache has continued to improve.  She denies any fevers, chills, chest pain, or cough.  ED Course: Upon arrival to the ED, patient is found to be afebrile, saturating well on room air, and with vitals otherwise stable.  EKG features a normal sinus rhythm and chest x-ray is negative for acute cardiopulmonary disease.  Chemistry panel is notable for a glucose of 306 and CBC features a hemoglobin of 6.6 with platelets 95,000 and MCV 71.  INR is 2.48, troponin is slightly elevated to 0.03, and BNP is normal.  2 units of packed red blood cells were ordered for immediate transfusion from the ED.  Patient remains hemodynamically stable and in no acute distress.  She will be admitted for ongoing evaluation and management of symptomatic anemia suspected secondary to DUB with no bleeding reported  in the past week.  Review of Systems:  All other systems reviewed and apart from HPI, are negative.  Past Medical History:  Diagnosis Date  . Aortic stenosis due to bicuspid aortic valve   . Essential hypertension, benign   . Palpitations   . Type 2 diabetes mellitus ([place])     Past Surgical History:  Procedure Laterality Date  . AORTIC VALVE REPLACEMENT N/A 02/14/2014   Procedure: AORTIC VALVE REPLACEMENT (AVR);  Surgeon: Gaye Pollack, MD;  Location: [place];  Service: Open Heart Surgery;  Laterality: N/A;  . CARDIAC CATHETERIZATION    . CESAREAN SECTION  2009  . CHEST TUBE INSERTION Right 03/27/2014   Procedure: CHEST TUBE INSERTION;  Surgeon: Grace Isaac, MD;  Location: [place];  Service: Thoracic;  Laterality: Right;  . INTRAOPERATIVE TRANSESOPHAGEAL ECHOCARDIOGRAM N/A 02/14/2014   Procedure: INTRAOPERATIVE TRANSESOPHAGEAL ECHOCARDIOGRAM;  Surgeon: Gaye Pollack, MD;  Location: [facility] OR;  Service: Open Heart Surgery;  Laterality: N/A;  . LEFT AND RIGHT HEART CATHETERIZATION WITH CORONARY ANGIOGRAM N/A 01/25/2014   Procedure: LEFT AND RIGHT HEART CATHETERIZATION WITH CORONARY ANGIOGRAM;  Surgeon: Peter M Martinique, MD;  Location: [facility] CATH LAB;  Service: Cardiovascular;  Laterality: N/A;  . PERICARDIAL FLUID DRAINAGE N/A 03/27/2014   Procedure: DRAINAGE OF PERICARDIAL FLUID;  Surgeon: Grace Isaac, MD;  Location: [place];  Service: Thoracic;  Laterality: N/A;  . PLEURAL EFFUSION DRAINAGE Right 03/27/2014   Procedure: DRAINAGE OF PLEURAL EFFUSION;  Surgeon: Grace Isaac, MD;  Location: [place];  Service: Thoracic;  Laterality: Right;  Drainage of right pleural effusion  . SUBXYPHOID PERICARDIAL WINDOW N/A 03/27/2014   Procedure: SUBXYPHOID PERICARDIAL WINDOW;  Surgeon: Grace Isaac, MD;  Location: [facility] OR;  Service: Thoracic;  Laterality: N/A;  . TUBAL LIGATION       reports that she has never smoked. She has never used smokeless tobacco. She reports that she does not drink alcohol or  use drugs.  Allergies  Allergen Reactions  . Glipizide Nausea And Vomiting and Other (See Comments)    GI upset  . Metoprolol Other (See Comments)    "Made me sick"  . Shellfish Allergy Swelling    Throat and eyes were swollen.  Had difficulty breathing.  Was hospitalized.      Family History  Problem Relation Age of Onset  . Diabetes Mother   . Colon cancer Maternal Grandmother   . Brain cancer Brother      Prior to Admission medications   Medication Sig Start Date End Date Taking? Authorizing Provider  glucose blood (ONETOUCH VERIO) test strip Use to check BG up to bid. 11/30/14   Cherre Robins, PharmD  hyoscyamine (LEVSIN SL) 0.125 MG SL tablet Place 1 tablet (0.125 mg total) under the tongue every 6 (six) hours as needed. 12/20/15   Hassell Done, Mary-Margaret, FNP  Insulin Degludec (TRESIBA FLEXTOUCH) 200 UNIT/ML SOPN Inject 56 Units into the skin daily. 12/17/16   Hassell Done, Mary-Margaret, FNP  Insulin Pen Needle (DROPLET PEN NEEDLES) 32G X 4 MM MISC Use with insulin pen daily as directed 09/26/16   Hassell Done, Mary-Margaret, FNP  metFORMIN (GLUCOPHAGE XR) 500 MG 24 hr tablet Take 2 tablets (1,000 mg total) by mouth 2 (two) times daily. 09/26/16   Chevis Pretty, FNP  [facility] DELICA LANCETS FINE MISC Use to check BG up to bid 11/30/14   Cherre Robins, PharmD  ranitidine (ZANTAC) 150 MG tablet Take 1 tablet (150 mg total) by mouth daily. 09/26/16   Hassell Done Mary-Margaret, FNP  triamcinolone cream (KENALOG) 0.1 % Apply 1 application topically 2 (two) times daily. 09/26/16   Chevis Pretty, FNP  warfarin (COUMADIN) 5 MG tablet TAKE 1 TABLET BY MOUTH DAILY OR AS DIRECTED BY COUMADIN CLINIC 05/26/17   Chevis Pretty, FNP    Physical Exam: Vitals:   05/31/17 2122 05/31/17 2200 05/31/17 2300  BP: (!) 151/68 123/67 122/66  Pulse: (!) 101 89 88  Resp: (!) 22 18 18   Temp: (!) 97.4 F (36.3 C)  98.4 F (36.9 C)  TempSrc: Oral    SpO2: 100% 99% 98%  Weight: 102.1 kg (225 lb)      Height: 5\' 8"  (1.727 m)        Constitutional: NAD, calm, pale Eyes: PERTLA, lids and conjunctivae normal ENMT: Mucous membranes are moist. Posterior pharynx clear of any exudate or lesions.   Neck: normal, supple, no masses, no thyromegaly Respiratory: clear to auscultation bilaterally, no wheezing, no crackles. Normal respiratory effort.  Cardiovascular: S1 & S2 heard, regular rate and rhythm. No extremity edema. Trace pedal edema bilaterally. No significant JVD. Abdomen: No distension, no tenderness, soft. Bowel sounds normal.  Musculoskeletal: no clubbing / cyanosis. No joint deformity upper and lower extremities.   Skin: no significant rashes, lesions, ulcers. Pale. Neurologic: CN 2-12 grossly intact. Sensation intact. Strength 5/5 in all 4 limbs.  Psychiatric: Alert and oriented x 3. Pleasant and cooperative.     Labs on Admission: I have personally reviewed following labs and imaging studies  CBC: Recent Labs  Lab 05/31/17 2159  WBC 6.0  HGB  6.6*  HCT 24.0*  MCV 71.0*  PLT 95*   Basic Metabolic Panel: Recent Labs  Lab 05/31/17 2159  NA 134*  K 4.0  CL 101  CO2 25  GLUCOSE 306*  BUN 9  CREATININE 0.63  CALCIUM 8.8*   GFR: Estimated Creatinine Clearance: 112.2 mL/min (by C-G formula based on SCr of 0.63 mg/dL). Liver Function Tests: No results for input(s): AST, ALT, ALKPHOS, BILITOT, PROT, ALBUMIN in the last 168 hours. No results for input(s): LIPASE, AMYLASE in the last 168 hours. No results for input(s): AMMONIA in the last 168 hours. Coagulation Profile: Recent Labs  Lab 05/31/17 2159  INR 2.48   Cardiac Enzymes: Recent Labs  Lab 05/31/17 2159  TROPONINI 0.03*   BNP (last 3 results) No results for input(s): PROBNP in the last 8760 hours. HbA1C: No results for input(s): HGBA1C in the last 72 hours. CBG: No results for input(s): GLUCAP in the last 168 hours. Lipid Profile: No results for input(s): CHOL, HDL, LDLCALC, TRIG, CHOLHDL,  LDLDIRECT in the last 72 hours. Thyroid Function Tests: No results for input(s): TSH, T4TOTAL, FREET4, T3FREE, THYROIDAB in the last 72 hours. Anemia Panel: No results for input(s): VITAMINB12, FOLATE, FERRITIN, TIBC, IRON, RETICCTPCT in the last 72 hours. Urine analysis:    Component Value Date/Time   COLORURINE YELLOW 02/10/2014 1402   APPEARANCEUR Cloudy (A) 04/01/2017 0851   LABSPEC 1.026 02/10/2014 1402   PHURINE 5.0 02/10/2014 1402   GLUCOSEU Negative 04/01/2017 0851   HGBUR NEGATIVE 02/10/2014 1402   BILIRUBINUR Negative 04/01/2017 0851   KETONESUR NEGATIVE 02/10/2014 1402   PROTEINUR Trace (A) 04/01/2017 0851   PROTEINUR NEGATIVE 02/10/2014 1402   UROBILINOGEN negative 03/09/2015 1104   UROBILINOGEN 0.2 02/10/2014 1402   NITRITE Positive (A) 04/01/2017 0851   NITRITE NEGATIVE 02/10/2014 1402   LEUKOCYTESUR 2+ (A) 04/01/2017 0851   Sepsis Labs: @LABRCNTIP (procalcitonin:4,lacticidven:4) )No results found for this or any previous visit (from the past 240 hour(s)).   Radiological Exams on Admission: Dg Chest 2 View  Result Date: 05/31/2017 CLINICAL DATA:  Left side chest pain. EXAM: CHEST - 2 VIEW COMPARISON:  05/29/2015 FINDINGS: Prior CABG and valve replacement. Heart and mediastinal contours are within normal limits. No focal opacities or effusions. No acute bony abnormality. IMPRESSION: No active cardiopulmonary disease. Electronically Signed   By: Rolm Baptise M.D.   On: 05/31/2017 22:30    EKG: Independently reviewed. Normal sinus rhythm.   Assessment/Plan  1. Symptomatic anemia; dysfunctional uterine bleeding  - Pt has hx of DUB followed by OBGYN and treated with Mirena ~3 wks ago; she reports heavy bleeding following IUD insertion that stopped ~1 wk ago  - She now presents with DOE, fatigue, and pallor; denies melena, hematochezia, or any menstrual bleeding in the last week  - Hemodynamically stable  - 2 units RBC's ordered for transfusion, will check  post-transfusion CBC    2. Hx of aortic valve replacement  - Anticoagulated with warfarin, INR is 2.48 on admission  - As noted above, bleeding seems to have stopped more than a week ago  - Continue warfarin    3. Insulin-dependent DM  - A1c was 10.5% remotely  - Managed at home with Tresiba 56 units qD and metformin, held on admission  - Check CBG's, continue basal insulin with Lantus 40 units qD plus SSI with Novolog while in hospital    4. Elevated troponin  - Troponin slightly elevated at 0.03  - Pt denies chest pain, reports right arm ache  and DOE  - Continue cardiac monitoring, trend troponin, continue warfarin    5. Thrombocytopenia  - Platelets 95k on admission without active bleeding  - Likely secondary to nutrient deficiency from chronic heavy bleeding  - Check anemia panel    DVT prophylaxis: warfarin Code Status: Full  Family Communication: Husband updated at bedside Consults called: None  Admission status: Inpatient    Vianne Bulls, MD [facility] Pager [phone]  If 7PM-7AM, please contact night-coverage [email or web address] Password [facility]  05/31/2017, 11:36 PM

## 2017-05-31 NOTE — ED Notes (Signed)
Date and time results received: 05/31/17 @22 :45 Test: trop Critical Value: 0.03  Name of Provider Notified: Dr Winfred Leeds  Orders Received? Or Actions Taken?:  No additional orders received,

## 2017-06-01 ENCOUNTER — Encounter (HOSPITAL_COMMUNITY): Payer: Self-pay

## 2017-06-01 DIAGNOSIS — D649 Anemia, unspecified: Principal | ICD-10-CM

## 2017-06-01 DIAGNOSIS — E119 Type 2 diabetes mellitus without complications: Secondary | ICD-10-CM

## 2017-06-01 DIAGNOSIS — Z794 Long term (current) use of insulin: Secondary | ICD-10-CM

## 2017-06-01 DIAGNOSIS — Z952 Presence of prosthetic heart valve: Secondary | ICD-10-CM

## 2017-06-01 DIAGNOSIS — D696 Thrombocytopenia, unspecified: Secondary | ICD-10-CM

## 2017-06-01 DIAGNOSIS — N938 Other specified abnormal uterine and vaginal bleeding: Secondary | ICD-10-CM

## 2017-06-01 DIAGNOSIS — I10 Essential (primary) hypertension: Secondary | ICD-10-CM

## 2017-06-01 LAB — CBC WITH DIFFERENTIAL/PLATELET
BASOS ABS: 0 10*3/uL (ref 0.0–0.1)
Basophils Relative: 0 %
EOS ABS: 0.1 10*3/uL (ref 0.0–0.7)
Eosinophils Relative: 2 %
HEMATOCRIT: 30.2 % — AB (ref 36.0–46.0)
Hemoglobin: 8.7 g/dL — ABNORMAL LOW (ref 12.0–15.0)
LYMPHS PCT: 26 %
Lymphs Abs: 1.5 10*3/uL (ref 0.7–4.0)
MCH: 20.9 pg — ABNORMAL LOW (ref 26.0–34.0)
MCHC: 28.8 g/dL — ABNORMAL LOW (ref 30.0–36.0)
MCV: 72.6 fL — ABNORMAL LOW (ref 78.0–100.0)
MONOS PCT: 8 %
Monocytes Absolute: 0.5 10*3/uL (ref 0.1–1.0)
Neutro Abs: 3.6 10*3/uL (ref 1.7–7.7)
Neutrophils Relative %: 64 %
Platelets: 103 10*3/uL — ABNORMAL LOW (ref 150–400)
RBC: 4.16 MIL/uL (ref 3.87–5.11)
RDW: 17.7 % — AB (ref 11.5–15.5)
WBC: 5.7 10*3/uL (ref 4.0–10.5)

## 2017-06-01 LAB — IRON AND TIBC
IRON: 145 ug/dL (ref 28–170)
SATURATION RATIOS: 26 % (ref 10.4–31.8)
TIBC: 552 ug/dL — AB (ref 250–450)
UIBC: 407 ug/dL

## 2017-06-01 LAB — FOLATE: FOLATE: 8.2 ng/mL (ref >5.9)

## 2017-06-01 LAB — GLUCOSE, CAPILLARY
GLUCOSE-CAPILLARY: 180 mg/dL — AB (ref 65–99)
GLUCOSE-CAPILLARY: 173 mg/dL — AB (ref 65–99)
Glucose-Capillary: 180 mg/dL — ABNORMAL HIGH (ref 65–99)
Glucose-Capillary: 227 mg/dL — ABNORMAL HIGH (ref 65–99)
Glucose-Capillary: 264 mg/dL — ABNORMAL HIGH (ref 65–99)

## 2017-06-01 LAB — TROPONIN I
TROPONIN I: 0.48 ng/mL — AB (ref <0.03)
TROPONIN I: 0.26 ng/mL — AB (ref <0.03)
Troponin I: 0.35 ng/mL (ref <0.03)

## 2017-06-01 LAB — RETICULOCYTES
RBC.: 4.12 MIL/uL (ref 3.87–5.11)
RETIC CT PCT: 1.8 % (ref 0.4–3.1)
Retic Count, Absolute: 74.2 10*3/uL (ref 19.0–186.0)

## 2017-06-01 LAB — FERRITIN: FERRITIN: 4 ng/mL — AB (ref 11–307)

## 2017-06-01 LAB — VITAMIN B12: Vitamin B-12: 296 pg/mL (ref 180–914)

## 2017-06-01 MED ORDER — WARFARIN SODIUM 2.5 MG PO TABS
2.5000 mg | ORAL_TABLET | Freq: Once | ORAL | Status: AC
  Administered 2017-06-01: 2.5 mg via ORAL
  Filled 2017-06-01: qty 1

## 2017-06-01 MED ORDER — WARFARIN - PHARMACIST DOSING INPATIENT: Status: DC

## 2017-06-01 NOTE — Progress Notes (Signed)
**Note De-identified Wayden Schwertner Obfuscation** EKG complete; placed in patient chart 

## 2017-06-01 NOTE — Progress Notes (Signed)
PROGRESS NOTE    IVAN MASKELL  VQM:[id number]  DOB: 05-25-1970  DOA: 05/31/2017 PCP: Chevis Pretty, FNP   Brief Admission Hx: LORAE ROIG is a 47 y.o. female with medical history significant for aortic valve replacement on warfarin, insulin-dependent diabetes mellitus, and dysfunctional uterine bleeding treated with Mirena approximately 3 weeks ago, now presenting to the emergency department with fatigue, exertional dyspnea, and right arm ache.  Patient reports a long history of very heavy and long menstrual periods, requiring blood transfusion in 2016, followed by gynecology and treated with Mirena IUD approximately 3 weeks ago.   MDM/Assessment & Plan:   1. Symptomatic anemia, DUB - Pt feeling much better after PRBC transfusion.  2. History of aortic valve replacement - stable PT INR on warfarin.  Pharmacy helping to manage warfarin. 3. IDDM - monitor closely, continue lantus and SSI coverage.  4. Elevated troponin - I suspect that this is demand ischemia from severe anemia.  Pt had a cardiac cath in 2016 with reportedly normal coronaries.  Trending troponin as it is coming down.  EKG reviewed no acute findings. Pt has no symptoms of SOB or chest pain after transfusion.  5. Thrombocytopenia - no bleeding, stable.     DVT prophylaxis: warfarin Code Status: Full  Family Communication: Husband Disposition Plan: Home tomorrow if stable.   Subjective: Pt says she feels much better after transfusion.    Objective: Vitals:   06/01/17 0111 06/01/17 0336 06/01/17 0400 06/01/17 0614  BP: 122/68 119/67 108/65 128/80  Pulse: 82 84 78 78  Resp: 18 18 18 18   Temp: 98.5 F (36.9 C) 98.5 F (36.9 C) 98.5 F (36.9 C) 98.2 F (36.8 C)  TempSrc: Oral Oral Oral Oral  SpO2: 100% 100% 100% 100%  Weight:      Height:        Intake/Output Summary (Last 24 hours) at 06/01/2017 1608 Last data filed at 06/01/2017 9326 Gross per 24 hour  Intake 618.87 ml  Output -  Net 618.87  ml   Filed Weights   05/31/17 2122 06/01/17 0001  Weight: 102.1 kg (225 lb) 109.5 kg (241 lb 6.5 oz)    REVIEW OF SYSTEMS  As per history otherwise all reviewed and reported negative  Exam:  General exam: awake, alert, NAD.  Respiratory system: Clear. No increased work of breathing. Cardiovascular system: S1 & S2 heard.  Gastrointestinal system: Abdomen is nondistended, soft and nontender. Normal bowel sounds heard. Central nervous system: Alert and oriented. No focal neurological deficits. Extremities: no cyanosis or clubbing.   Data Reviewed: Basic Metabolic Panel: Recent Labs  Lab 05/31/17 2159  NA 134*  K 4.0  CL 101  CO2 25  GLUCOSE 306*  BUN 9  CREATININE 0.63  CALCIUM 8.8*   Liver Function Tests: No results for input(s): AST, ALT, ALKPHOS, BILITOT, PROT, ALBUMIN in the last 168 hours. No results for input(s): LIPASE, AMYLASE in the last 168 hours. No results for input(s): AMMONIA in the last 168 hours. CBC: Recent Labs  Lab 05/31/17 2159 06/01/17 0751  WBC 6.0 5.7  NEUTROABS  --  3.6  HGB 6.6* 8.7*  HCT 24.0* 30.2*  MCV 71.0* 72.6*  PLT 95* 103*   Cardiac Enzymes: Recent Labs  Lab 05/31/17 2159 06/01/17 0751 06/01/17 1503  TROPONINI 0.03* 0.48* 0.35*   CBG (last 3)  Recent Labs    06/01/17 0021 06/01/17 0750 06/01/17 1157  GLUCAP 264* 180* 173*   No results found for this or any previous  visit (from the past 240 hour(s)).   Studies: Dg Chest 2 View  Result Date: 05/31/2017 CLINICAL DATA:  Left side chest pain. EXAM: CHEST - 2 VIEW COMPARISON:  05/29/2015 FINDINGS: Prior CABG and valve replacement. Heart and mediastinal contours are within normal limits. No focal opacities or effusions. No acute bony abnormality. IMPRESSION: No active cardiopulmonary disease. Electronically Signed   By: Rolm Baptise M.D.   On: 05/31/2017 22:30   Scheduled Meds: . famotidine  20 mg Oral Daily  . insulin aspart  0-5 Units Subcutaneous QHS  . insulin  aspart  0-9 Units Subcutaneous TID WC  . insulin glargine  40 Units Subcutaneous Daily  . sodium chloride flush  3 mL Intravenous Q12H  . warfarin  2.5 mg Oral Once  . Warfarin - Pharmacist Dosing Inpatient   Does not apply Q24H   Continuous Infusions: . sodium chloride      Principal Problem:   Symptomatic anemia Active Problems:   Essential hypertension, benign   Insulin-requiring or dependent type II diabetes mellitus (HCC)   S/P AVR (aortic valve replacement)   Dysfunctional uterine bleeding   Thrombocytopenia ([place])  Time spent:   Irwin Brakeman, MD, FAAFP [facility] Pager [phone]  If 7PM-7AM, please contact night-coverage [email or web address] Password [email or web address] 06/01/2017, 4:08 PM    LOS: 1 day

## 2017-06-01 NOTE — Progress Notes (Signed)
ANTICOAGULATION CONSULT NOTE - Initial Consult  Pharmacy Consult for Coumadin (home medication) Indication: aortic valve replacement  Allergies  Allergen Reactions  . Glipizide Nausea And Vomiting and Other (See Comments)    GI upset  . Metoprolol Other (See Comments)    "Made me sick"  . Shellfish Allergy Swelling    Throat and eyes were swollen.  Had difficulty breathing.  Was hospitalized.      Patient Measurements: Height: 5\' 8"  (172.7 cm) Weight: 241 lb 6.5 oz (109.5 kg) IBW/kg (Calculated) : 63.9  Vital Signs: Temp: 98.2 F (36.8 C) (05/19 0614) Temp Source: Oral (05/19 0614) BP: 128/80 (05/19 0614) Pulse Rate: 78 (05/19 0614)  Labs: Recent Labs    05/31/17 2159 06/01/17 0751  HGB 6.6* 8.7*  HCT 24.0* 30.2*  PLT 95* 103*  LABPROT 26.7*  --   INR 2.48  --   CREATININE 0.63  --   TROPONINI 0.03* 0.48*    Estimated Creatinine Clearance: 116.3 mL/min (by C-G formula based on SCr of 0.63 mg/dL).   Medical History: Past Medical History:  Diagnosis Date  . Aortic stenosis due to bicuspid aortic valve   . Essential hypertension, benign   . Palpitations   . Type 2 diabetes mellitus (HCC)     Medications:  Medications Prior to Admission  Medication Sig Dispense Refill Last Dose  . glucose blood (ONETOUCH VERIO) test strip Use to check BG up to bid. 100 each 5 Taking  . hyoscyamine (LEVSIN SL) 0.125 MG SL tablet Place 1 tablet (0.125 mg total) under the tongue every 6 (six) hours as needed. 30 tablet 1 Taking  . Insulin Degludec (TRESIBA FLEXTOUCH) 200 UNIT/ML SOPN Inject 56 Units into the skin daily. 15 pen 1 Taking  . Insulin Pen Needle (DROPLET PEN NEEDLES) 32G X 4 MM MISC Use with insulin pen daily as directed 100 each 2 Taking  . metFORMIN (GLUCOPHAGE XR) 500 MG 24 hr tablet Take 2 tablets (1,000 mg total) by mouth 2 (two) times daily. 360 tablet 1 Taking  . ONETOUCH DELICA LANCETS FINE MISC Use to check BG up to bid 100 each 5 Taking  . ranitidine  (ZANTAC) 150 MG tablet Take 1 tablet (150 mg total) by mouth daily. 90 tablet 1 Taking  . triamcinolone cream (KENALOG) 0.1 % Apply 1 application topically 2 (two) times daily. 453.6 g 1 Taking  . warfarin (COUMADIN) 5 MG tablet TAKE 1 TABLET BY MOUTH DAILY OR AS DIRECTED BY COUMADIN CLINIC 90 tablet 2    Assessment: 47 y.o. female with medical history significant for aortic valve replacement on warfarin, insulin-dependent diabetes mellitus, and dysfunctional uterine bleeding treated with Mirena approximately 3 weeks ago, now presenting to the emergency department with fatigue, exertional dyspnea, and right arm ache.  Patient reports a long history of very heavy and long menstrual periods, requiring blood transfusion in 2016, followed by gynecology and treated with Mirena IUD approximately 3 weeks ago.  She reports very heavy bleeding for about 2 weeks after insertion of IUD, but reports that there has been no bleeding in the past 1 week.  Pt was transfused in ED on admission and MD noted to continue warfarin.   Goal of Therapy:  INR 2-3 Monitor platelets by anticoagulation protocol: Yes   Plan:  Coumadin 2.5mg  po today x 1 at 4pm Monitor CBC, Protime, s/sx of bleeding  Hart Robinsons A 06/01/2017,9:31 AM

## 2017-06-01 NOTE — Progress Notes (Signed)
Dr. Wynetta Emery notified via text page of Troponin.

## 2017-06-02 LAB — GLUCOSE, CAPILLARY: Glucose-Capillary: 208 mg/dL — ABNORMAL HIGH (ref 65–99)

## 2017-06-02 LAB — PROTIME-INR
INR: 2.09
Prothrombin Time: 23.3 seconds — ABNORMAL HIGH (ref 11.4–15.2)

## 2017-06-02 LAB — CBC
HCT: 30.7 % — ABNORMAL LOW (ref 36.0–46.0)
HEMOGLOBIN: 8.8 g/dL — AB (ref 12.0–15.0)
MCH: 20.9 pg — AB (ref 26.0–34.0)
MCHC: 28.7 g/dL — AB (ref 30.0–36.0)
MCV: 72.7 fL — ABNORMAL LOW (ref 78.0–100.0)
Platelets: 109 10*3/uL — ABNORMAL LOW (ref 150–400)
RBC: 4.22 MIL/uL (ref 3.87–5.11)
RDW: 17.8 % — AB (ref 11.5–15.5)
WBC: 7.5 10*3/uL (ref 4.0–10.5)

## 2017-06-02 LAB — HIV ANTIBODY (ROUTINE TESTING W REFLEX): HIV Screen 4th Generation wRfx: NONREACTIVE

## 2017-06-02 LAB — TROPONIN I: Troponin I: 0.19 ng/mL (ref <0.03)

## 2017-06-02 MED ORDER — METFORMIN HCL ER 500 MG PO TB24: 500.0000 mg | ORAL_TABLET | Freq: Two times a day (BID) | ORAL | Status: DC

## 2017-06-02 NOTE — Discharge Instructions (Signed)
Follow with Primary MD  Chevis Pretty, FNP  and other consultants as instructed your Hospitalist MD  Please get a complete blood count and chemistry panel checked by your Primary MD at your next visit, and again as instructed by your Primary MD.  Get Medicines reviewed and adjusted: Please take all your medications with you for your next visit with your Primary MD  Laboratory/radiological data: Please request your Primary MD to go over all hospital tests and procedure/radiological results at the follow up, please ask your Primary MD to get all Hospital records sent to his/her office.  In some cases, they will be blood work, cultures and biopsy results pending at the time of your discharge. Please request that your primary care M.D. follows up on these results.  Also Note the following: If you experience worsening of your admission symptoms, develop shortness of breath, life threatening emergency, suicidal or homicidal thoughts you must seek medical attention immediately by calling 911 or calling your MD immediately  if symptoms less severe.  You must read complete instructions/literature along with all the possible adverse reactions/side effects for all the Medicines you take and that have been prescribed to you. Take any new Medicines after you have completely understood and accpet all the possible adverse reactions/side effects.   Do not drive when taking Pain medications or sleeping medications (Benzodaizepines)  Do not take more than prescribed Pain, Sleep and Anxiety Medications. It is not advisable to combine anxiety,sleep and pain medications without talking with your primary care practitioner  Special Instructions: If you have smoked or chewed Tobacco  in the last 2 yrs please stop smoking, stop any regular Alcohol  and or any Recreational drug use.  Wear Seat belts while driving.  Please note: You were cared for by a hospitalist during your hospital stay. Once you are  discharged, your primary care physician will handle any further medical issues. Please note that NO REFILLS for any discharge medications will be authorized once you are discharged, as it is imperative that you return to your primary care physician (or establish a relationship with a primary care physician if you do not have one) for your post hospital discharge needs so that they can reassess your need for medications and monitor your lab values.    Anemia Anemia is a condition in which you do not have enough red blood cells or hemoglobin. Hemoglobin is a substance in red blood cells that carries oxygen. When you do not have enough red blood cells or hemoglobin (are anemic), your body cannot get enough oxygen and your organs may not work properly. As a result, you may feel very tired or have other problems. What are the causes? Common causes of anemia include:  Excessive bleeding. Anemia can be caused by excessive bleeding inside or outside the body, including bleeding from the intestine or from periods in women.  Poor nutrition.  Long-lasting (chronic) kidney, thyroid, and liver disease.  Bone marrow disorders.  Cancer and treatments for cancer.  HIV (human immunodeficiency virus) and AIDS (acquired immunodeficiency syndrome).  Treatments for HIV and AIDS.  Spleen problems.  Blood disorders.  Infections, medicines, and autoimmune disorders that destroy red blood cells.  What are the signs or symptoms? Symptoms of this condition include:  Minor weakness.  Dizziness.  Headache.  Feeling heartbeats that are irregular or faster than normal (palpitations).  Shortness of breath, especially with exercise.  Paleness.  Cold sensitivity.  Indigestion.  Nausea.  Difficulty sleeping.  Difficulty concentrating.  Symptoms  may occur suddenly or develop slowly. If your anemia is mild, you may not have symptoms. How is this diagnosed? This condition is diagnosed based  on:  Blood tests.  Your medical history.  A physical exam.  Bone marrow biopsy.  Your health care provider may also check your stool (feces) for blood and may do additional testing to look for the cause of your bleeding. You may also have other tests, including:  Imaging tests, such as a CT scan or MRI.  Endoscopy.  Colonoscopy.  How is this treated? Treatment for this condition depends on the cause. If you continue to lose a lot of blood, you may need to be treated at a hospital. Treatment may include:  Taking supplements of iron, vitamin H67, or folic acid.  Taking a hormone medicine (erythropoietin) that can help to stimulate red blood cell growth.  Having a blood transfusion. This may be needed if you lose a lot of blood.  Making changes to your diet.  Having surgery to remove your spleen.  Follow these instructions at home:  Take over-the-counter and prescription medicines only as told by your health care provider.  Take supplements only as told by your health care provider.  Follow any diet instructions that you were given.  Keep all follow-up visits as told by your health care provider. This is important. Contact a health care provider if:  You develop new bleeding anywhere in the body. Get help right away if:  You are very weak.  You are short of breath.  You have pain in your abdomen or chest.  You are dizzy or feel faint.  You have trouble concentrating.  You have bloody or black, tarry stools.  You vomit repeatedly or you vomit up blood. Summary  Anemia is a condition in which you do not have enough red blood cells or enough of a substance in your red blood cells that carries oxygen (hemoglobin).  Symptoms may occur suddenly or develop slowly.  If your anemia is mild, you may not have symptoms.  This condition is diagnosed with blood tests as well as a medical history and physical exam. Other tests may be needed.  Treatment for this  condition depends on the cause of the anemia. This information is not intended to replace advice given to you by your health care provider. Make sure you discuss any questions you have with your health care provider. Document Released: 02/08/2004 Document Revised: 02/02/2016 Document Reviewed: 02/02/2016 Elsevier Interactive Patient Education  2018 [employer].   Blood Transfusion, Care After This sheet gives you information about how to care for yourself after your procedure. Your doctor may also give you more specific instructions. If you have problems or questions, contact your doctor. Follow these instructions at home:  Take over-the-counter and prescription medicines only as told by your doctor.  Go back to your normal activities as told by your doctor.  Follow instructions from your doctor about how to take care of the area where an IV tube was put into your vein (insertion site). Make sure you: ? Wash your hands with soap and water before you change your bandage (dressing). If there is no soap and water, use hand sanitizer. ? Change your bandage as told by your doctor.  Check your IV insertion site every day for signs of infection. Check for: ? More redness, swelling, or pain. ? More fluid or blood. ? Warmth. ? Pus or a bad smell. Contact a doctor if:  You have more  redness, swelling, or pain around the IV insertion site..  You have more fluid or blood coming from the IV insertion site.  Your IV insertion site feels warm to the touch.  You have pus or a bad smell coming from the IV insertion site.  Your pee (urine) turns pink, red, or brown.  You feel weak after doing your normal activities. Get help right away if:  You have signs of a serious allergic or body defense (immune) system reaction, including: ? Itchiness. ? Hives. ? Trouble breathing. ? Anxiety. ? Pain in your chest or lower back. ? Fever, flushing, and chills. ? Fast pulse. ? Rash. ? Watery poop  (diarrhea). ? Throwing up (vomiting). ? Dark pee. ? Serious headache. ? Dizziness. ? Stiff neck. ? Yellow color in your face or the white parts of your eyes (jaundice). Summary  After a blood transfusion, return to your normal activities as told by your doctor.  Every day, check for signs of infection where the IV tube was put into your vein.  Some signs of infection are warm skin, more redness and pain, more fluid or blood, and pus or a bad smell where the needle went in.  Contact your doctor if you feel weak or have any unusual symptoms. This information is not intended to replace advice given to you by your health care provider. Make sure you discuss any questions you have with your health care provider. Document Released: 01/21/2014 Document Revised: 08/25/2015 Document Reviewed: 08/25/2015 Elsevier Interactive Patient Education  2017 [employer].

## 2017-06-02 NOTE — Discharge Summary (Signed)
Physician Discharge Summary  Anna Salinas TLX:[id number] DOB: [birth year] DOA: 05/31/2017  PCP: Chevis Pretty, FNP Gynecologist: Evie Lacks Cardiologist: [place]  Admit date: 05/31/2017 Discharge date: 06/02/2017  Admitted From: Home  Disposition: Home  Recommendations for Outpatient Follow-up:  1. Follow up with PCP in 1 weeks 2. Follow up with gynecologist in 1-2 weeks 3. Follow up with cardiology as scheduled. 4. Follow up for anticoagulation PT/INR testing later this week 5. Please obtain BMP/CBC in 1-2 weeks  Discharge Condition: STABLE   CODE STATUS: FULL    Brief Hospitalization Summary: Please see all hospital notes, images, labs for full details of the hospitalization.  HPI: Anna Salinas is a 47 y.o. female with medical history significant for aortic valve replacement on warfarin, insulin-dependent diabetes mellitus, and dysfunctional uterine bleeding treated with Mirena approximately 3 weeks ago, now presenting to the emergency department with fatigue, exertional dyspnea, and right arm ache.  Patient reports a long history of very heavy and long menstrual periods, requiring blood transfusion in 2016, followed by gynecology and treated with Mirena IUD approximately 3 weeks ago.  She reports very heavy bleeding for about 2 weeks after insertion of IUD, but reports that there has been no bleeding in the past 1 week.  She complains of exertional dyspnea for the past 2 weeks or so, in addition to fatigue.  She was cooking this evening when she developed an ache in the bilateral arms, particularly the right arm.  Left arm ache resolved by the time of arrival in the hospital the right arm ache has continued to improve.  She denies any fevers, chills, chest pain, or cough.  ED Course: Upon arrival to the ED, patient is found to be afebrile, saturating well on room air, and with vitals otherwise stable.  EKG features a normal sinus rhythm and chest x-ray is negative for acute  cardiopulmonary disease.  Chemistry panel is notable for a glucose of 306 and CBC features a hemoglobin of 6.6 with platelets 95,000 and MCV 71.  INR is 2.48, troponin is slightly elevated to 0.03, and BNP is normal.  2 units of packed red blood cells were ordered for immediate transfusion from the ED.  Patient remains hemodynamically stable and in no acute distress.  She will be admitted for ongoing evaluation and management of symptomatic anemia suspected secondary to DUB with no bleeding reported in the past week.  Brief Admission Hx: Anna Knee Haydenis a 47 y.o.femalewith medical history significant foraortic valve replacement on warfarin, insulin-dependent diabetes mellitus, and dysfunctional uterine bleeding treated with Mirena approximately 3 weeks ago, now presenting to the emergency department with fatigue, exertional dyspnea, and right arm ache. Patient reports a long history of very heavy and long menstrual periods, requiring blood transfusion in 2016, followed by gynecology and treated with Mirena IUD approximately 3 weeks ago.   MDM/Assessment & Plan:   1. Symptomatic anemia, DUB - Pt feeling much better after PRBC transfusion.  Hg up to 8.8 today.    2. History of aortic valve replacement - stable PT INR on warfarin.  Pharmacy helping to manage warfarin in hospital.   3. IDDM - monitor closely, continue lantus and SSI coverage.  4. Elevated troponin - I suspect that this is demand ischemia from severe anemia.  Pt had a cardiac cath in 2016 with reportedly normal coronaries.  Trending troponin as it is coming down.  EKG reviewed no acute findings. Pt has no symptoms of SOB or chest pain after transfusion.  5. Thrombocytopenia -  no bleeding, stable. Platelet count improve to 109 prior to discharge.    DVT prophylaxis: warfarin Code Status: Full  Family Communication: Husband Disposition Plan: Home with outpatient follow up    Discharge Diagnoses:  Principal Problem:    Symptomatic anemia Active Problems:   Essential hypertension, benign   Insulin-requiring or dependent type II diabetes mellitus (HCC)   S/P AVR (aortic valve replacement)   Dysfunctional uterine bleeding   Thrombocytopenia (HCC)  Discharge Instructions: Discharge Instructions    Call MD for:  difficulty breathing, headache or visual disturbances   Complete by:  As directed    Call MD for:  extreme fatigue   Complete by:  As directed    Call MD for:  persistant dizziness or light-headedness   Complete by:  As directed    Call MD for:  severe uncontrolled pain   Complete by:  As directed    Diet - low sodium heart healthy   Complete by:  As directed    Increase activity slowly   Complete by:  As directed      Allergies as of 06/02/2017      Reactions   Glipizide Nausea And Vomiting, Other (See Comments)   GI upset   Metoprolol Other (See Comments)   "Made me sick"   Shellfish Allergy Swelling   Throat and eyes were swollen.  Had difficulty breathing.  Was hospitalized.        Medication List    TAKE these medications   glucose blood test strip Commonly known as:  ONETOUCH VERIO Use to check BG up to bid.   hyoscyamine 0.125 MG SL tablet Commonly known as:  LEVSIN SL Place 1 tablet (0.125 mg total) under the tongue every 6 (six) hours as needed.   Insulin Degludec 200 UNIT/ML Sopn Commonly known as:  TRESIBA FLEXTOUCH Inject 56 Units into the skin daily.   Insulin Pen Needle 32G X 4 MM Misc Commonly known as:  DROPLET PEN NEEDLES Use with insulin pen daily as directed   metFORMIN 500 MG 24 hr tablet Commonly known as:  GLUCOPHAGE XR Take 1 tablet (500 mg total) by mouth 2 (two) times daily.   ONETOUCH DELICA LANCETS FINE Misc Use to check BG up to bid   ranitidine 150 MG tablet Commonly known as:  ZANTAC Take 1 tablet (150 mg total) by mouth daily.   triamcinolone cream 0.1 % Commonly known as:  KENALOG Apply 1 application topically 2 (two) times daily.    warfarin 5 MG tablet Commonly known as:  COUMADIN Take as directed. If you are unsure how to take this medication, talk to your nurse or doctor. Original instructions:  TAKE 1 TABLET BY MOUTH DAILY OR AS DIRECTED BY COUMADIN CLINIC      Follow-up Information    Chevis Pretty, FNP. Schedule an appointment as soon as possible for a visit in 1 week(s).   Specialty:  Family Medicine Contact information: [place] [address] [phone]        Gari Crown, MD. Schedule an appointment as soon as possible for a visit in 2 week(s).   Specialty:  Obstetrics and Gynecology Why:  Hospital Follow Up  Contact information: [address] [place] [address] [phone]          Allergies  Allergen Reactions  . Glipizide Nausea And Vomiting and Other (See Comments)    GI upset  . Metoprolol Other (See Comments)    "Made me sick"  .  Shellfish Allergy Swelling    Throat and eyes were swollen.  Had difficulty breathing.  Was hospitalized.     Allergies as of 06/02/2017      Reactions   Glipizide Nausea And Vomiting, Other (See Comments)   GI upset   Metoprolol Other (See Comments)   "Made me sick"   Shellfish Allergy Swelling   Throat and eyes were swollen.  Had difficulty breathing.  Was hospitalized.        Medication List    TAKE these medications   glucose blood test strip Commonly known as:  ONETOUCH VERIO Use to check BG up to bid.   hyoscyamine 0.125 MG SL tablet Commonly known as:  LEVSIN SL Place 1 tablet (0.125 mg total) under the tongue every 6 (six) hours as needed.   Insulin Degludec 200 UNIT/ML Sopn Commonly known as:  TRESIBA FLEXTOUCH Inject 56 Units into the skin daily.   Insulin Pen Needle 32G X 4 MM Misc Commonly known as:  DROPLET PEN NEEDLES Use with insulin pen daily as directed   metFORMIN 500 MG 24 hr tablet Commonly known as:  GLUCOPHAGE XR Take 1 tablet (500 mg total) by mouth 2 (two) times daily.    ONETOUCH DELICA LANCETS FINE Misc Use to check BG up to bid   ranitidine 150 MG tablet Commonly known as:  ZANTAC Take 1 tablet (150 mg total) by mouth daily.   triamcinolone cream 0.1 % Commonly known as:  KENALOG Apply 1 application topically 2 (two) times daily.   warfarin 5 MG tablet Commonly known as:  COUMADIN Take as directed. If you are unsure how to take this medication, talk to your nurse or doctor. Original instructions:  TAKE 1 TABLET BY MOUTH DAILY OR AS DIRECTED BY COUMADIN CLINIC       Procedures/Studies: Dg Chest 2 View  Result Date: 05/31/2017 CLINICAL DATA:  Left side chest pain. EXAM: CHEST - 2 VIEW COMPARISON:  05/29/2015 FINDINGS: Prior CABG and valve replacement. Heart and mediastinal contours are within normal limits. No focal opacities or effusions. No acute bony abnormality. IMPRESSION: No active cardiopulmonary disease. Electronically Signed   By: Rolm Baptise M.D.   On: 05/31/2017 22:30      Subjective: Pt says she feels a lot better.  She has no chest pain or shortness of breath.  Her fatigue is better after the transfusion.   Discharge Exam: Vitals:   06/01/17 2131 06/02/17 0443  BP: 102/75 112/69  Pulse: 82 69  Resp: 18 18  Temp: (!) 100.4 F (38 C) 97.9 F (36.6 C)  SpO2: 100% 100%   Vitals:   06/01/17 0614 06/01/17 2034 06/01/17 2131 06/02/17 0443  BP: 128/80  102/75 112/69  Pulse: 78  82 69  Resp: 18  18 18   Temp: 98.2 F (36.8 C)  (!) 100.4 F (38 C) 97.9 F (36.6 C)  TempSrc: Oral  Oral Oral  SpO2: 100% 99% 100% 100%  Weight:      Height:       General: Pt is alert, awake, not in acute distress Cardiovascular: normal S1/S2 + with mechanical valve click no rubs, no gallops Respiratory: CTA bilaterally, no wheezing, no rhonchi Abdominal: Soft, NT, ND, bowel sounds + Extremities: no edema, no cyanosis   The results of significant diagnostics from this hospitalization (including imaging, microbiology, ancillary and  laboratory) are listed below for reference.     Microbiology: No results found for this or any previous visit (from the past 240 hour(s)).  Labs: BNP (last 3 results) Recent Labs    05/31/17 2159  BNP 56.3   Basic Metabolic Panel: Recent Labs  Lab 05/31/17 2159  NA 134*  K 4.0  CL 101  CO2 25  GLUCOSE 306*  BUN 9  CREATININE 0.63  CALCIUM 8.8*   Liver Function Tests: No results for input(s): AST, ALT, ALKPHOS, BILITOT, PROT, ALBUMIN in the last 168 hours. No results for input(s): LIPASE, AMYLASE in the last 168 hours. No results for input(s): AMMONIA in the last 168 hours. CBC: Recent Labs  Lab 05/31/17 2159 06/01/17 0751 06/02/17 0302  WBC 6.0 5.7 7.5  NEUTROABS  --  3.6  --   HGB 6.6* 8.7* 8.8*  HCT 24.0* 30.2* 30.7*  MCV 71.0* 72.6* 72.7*  PLT 95* 103* 109*   Cardiac Enzymes: Recent Labs  Lab 05/31/17 2159 06/01/17 0751 06/01/17 1503 06/01/17 2054 06/02/17 0302  TROPONINI 0.03* 0.48* 0.35* 0.26* 0.19*   BNP: Invalid input(s): POCBNP CBG: Recent Labs  Lab 06/01/17 0750 06/01/17 1157 06/01/17 1655 06/01/17 2143 06/02/17 0738  GLUCAP 180* 173* 180* 227* 208*   D-Dimer No results for input(s): DDIMER in the last 72 hours. Hgb A1c No results for input(s): HGBA1C in the last 72 hours. Lipid Profile No results for input(s): CHOL, HDL, LDLCALC, TRIG, CHOLHDL, LDLDIRECT in the last 72 hours. Thyroid function studies No results for input(s): TSH, T4TOTAL, T3FREE, THYROIDAB in the last 72 hours.  Invalid input(s): FREET3 Anemia work up Recent Labs    06/01/17 0751  VITAMINB12 296  FOLATE 8.2  FERRITIN 4*  TIBC 552*  IRON 145  RETICCTPCT 1.8   Urinalysis    Component Value Date/Time   COLORURINE YELLOW 02/10/2014 1402   APPEARANCEUR Cloudy (A) 04/01/2017 0851   LABSPEC 1.026 02/10/2014 1402   PHURINE 5.0 02/10/2014 1402   GLUCOSEU Negative 04/01/2017 0851   HGBUR NEGATIVE 02/10/2014 1402   BILIRUBINUR Negative 04/01/2017 [place] 02/10/2014 1402   PROTEINUR Trace (A) 04/01/2017 0851   PROTEINUR NEGATIVE 02/10/2014 1402   UROBILINOGEN negative 03/09/2015 1104   UROBILINOGEN 0.2 02/10/2014 1402   NITRITE Positive (A) 04/01/2017 0851   NITRITE NEGATIVE 02/10/2014 1402   LEUKOCYTESUR 2+ (A) 04/01/2017 0851   Sepsis Labs Invalid input(s): PROCALCITONIN,  WBC,  LACTICIDVEN Microbiology No results found for this or any previous visit (from the past 240 hour(s)).  Time coordinating discharge:   SIGNED:  Irwin Brakeman, MD  [facility] 06/02/2017, 8:34 AM Pager [phone]  If 7PM-7AM, please contact night-coverage [email or web address] Password [email or web address]

## 2017-06-02 NOTE — Progress Notes (Signed)
Inpatient Diabetes Program Recommendations  AACE/ADA: New Consensus Statement on Inpatient Glycemic Control (2015)  Target Ranges:  Prepandial:   less than 140 mg/dL      Peak postprandial:   less than 180 mg/dL (1-2 hours)      Critically ill patients:  140 - 180 mg/dL   Results for [name], [name] (MRN [id number]) as of 06/02/2017 07:47  Ref. Range 06/01/2017 07:50 06/01/2017 11:57 06/01/2017 16:55 06/01/2017 21:43 06/02/2017 07:38  Glucose-Capillary Latest Ref Range: 65 - 99 mg/dL 180 (H) 173 (H) 180 (H) 227 (H) 208 (H)   Review of Glycemic Control  Diabetes history: DM2 Outpatient Diabetes medications: Tresiba 56 units daily, Metformin 500 mg BID Current orders for Inpatient glycemic control: Lantus 40 units daily, Novolog 0-9 units TID with meals, Novolog 0-5 units QHS  Inpatient Diabetes Program Recommendations: Insulin - Basal: Please consider increasing Lantus to 43 units QHS.  Thanks, Barnie Alderman, RN, MSN, CDE Diabetes Coordinator Inpatient Diabetes Program [phone] (Team Pager from 8am to 5pm)

## 2017-06-02 NOTE — Progress Notes (Signed)
Patient is to be discharged home and in stable condition. Patient's IV and telemetry removed, WNL. Patient given discharge instructions and verbalized understanding. Patient denies the need for a wheelchair escort out, requesting to ambulate out.  Celestia Khat, RN

## 2017-06-04 LAB — TYPE AND SCREEN
ABO/RH(D): A POS
Antibody Screen: NEGATIVE
UNIT DIVISION: 0
UNIT DIVISION: 0
Unit division: 0

## 2017-06-04 LAB — BPAM RBC
BLOOD PRODUCT EXPIRATION DATE: 201905232359
Blood Product Expiration Date: 201906062359
Blood Product Expiration Date: 201906062359
ISSUE DATE / TIME: 201905190048
ISSUE DATE / TIME: 201905190350
UNIT TYPE AND RH: 6200
Unit Type and Rh: 6200
Unit Type and Rh: 9500

## 2017-06-10 ENCOUNTER — Ambulatory Visit: Payer: BLUE CROSS/BLUE SHIELD | Admitting: Nurse Practitioner

## 2017-06-10 ENCOUNTER — Encounter: Payer: Self-pay | Admitting: Nurse Practitioner

## 2017-06-10 VITALS — BP 126/78 | HR 78 | Temp 97.1°F | Ht 68.0 in | Wt 241.0 lb

## 2017-06-10 DIAGNOSIS — Z09 Encounter for follow-up examination after completed treatment for conditions other than malignant neoplasm: Secondary | ICD-10-CM

## 2017-06-10 DIAGNOSIS — D649 Anemia, unspecified: Secondary | ICD-10-CM | POA: Diagnosis not present

## 2017-06-10 DIAGNOSIS — Z952 Presence of prosthetic heart valve: Secondary | ICD-10-CM

## 2017-06-10 LAB — COAGUCHEK XS/INR WAIVED
INR: 2.1 — AB (ref 0.9–1.1)
Prothrombin Time: 24.8 s

## 2017-06-10 LAB — HEMOGLOBIN, FINGERSTICK: Hemoglobin: 8.5 g/dL — ABNORMAL LOW (ref 11.1–15.9)

## 2017-06-10 MED ORDER — HEMOCYTE PLUS 106-1 MG PO CAPS: 1.0000 | ORAL_CAPSULE | Freq: Every day | ORAL | 2 refills | Status: DC

## 2017-06-10 NOTE — Patient Instructions (Signed)
Anemia Anemia is a condition in which you do not have enough red blood cells or hemoglobin. Hemoglobin is a substance in red blood cells that carries oxygen. When you do not have enough red blood cells or hemoglobin (are anemic), your body cannot get enough oxygen and your organs may not work properly. As a result, you may feel very tired or have other problems. What are the causes? Common causes of anemia include:  Excessive bleeding. Anemia can be caused by excessive bleeding inside or outside the body, including bleeding from the intestine or from periods in women.  Poor nutrition.  Long-lasting (chronic) kidney, thyroid, and liver disease.  Bone marrow disorders.  Cancer and treatments for cancer.  HIV (human immunodeficiency virus) and AIDS (acquired immunodeficiency syndrome).  Treatments for HIV and AIDS.  Spleen problems.  Blood disorders.  Infections, medicines, and autoimmune disorders that destroy red blood cells.  What are the signs or symptoms? Symptoms of this condition include:  Minor weakness.  Dizziness.  Headache.  Feeling heartbeats that are irregular or faster than normal (palpitations).  Shortness of breath, especially with exercise.  Paleness.  Cold sensitivity.  Indigestion.  Nausea.  Difficulty sleeping.  Difficulty concentrating.  Symptoms may occur suddenly or develop slowly. If your anemia is mild, you may not have symptoms. How is this diagnosed? This condition is diagnosed based on:  Blood tests.  Your medical history.  A physical exam.  Bone marrow biopsy.  Your health care provider may also check your stool (feces) for blood and may do additional testing to look for the cause of your bleeding. You may also have other tests, including:  Imaging tests, such as a CT scan or MRI.  Endoscopy.  Colonoscopy.  How is this treated? Treatment for this condition depends on the cause. If you continue to lose a lot of blood,  you may need to be treated at a hospital. Treatment may include:  Taking supplements of iron, vitamin B12, or folic acid.  Taking a hormone medicine (erythropoietin) that can help to stimulate red blood cell growth.  Having a blood transfusion. This may be needed if you lose a lot of blood.  Making changes to your diet.  Having surgery to remove your spleen.  Follow these instructions at home:  Take over-the-counter and prescription medicines only as told by your health care provider.  Take supplements only as told by your health care provider.  Follow any diet instructions that you were given.  Keep all follow-up visits as told by your health care provider. This is important. Contact a health care provider if:  You develop new bleeding anywhere in the body. Get help right away if:  You are very weak.  You are short of breath.  You have pain in your abdomen or chest.  You are dizzy or feel faint.  You have trouble concentrating.  You have bloody or black, tarry stools.  You vomit repeatedly or you vomit up blood. Summary  Anemia is a condition in which you do not have enough red blood cells or enough of a substance in your red blood cells that carries oxygen (hemoglobin).  Symptoms may occur suddenly or develop slowly.  If your anemia is mild, you may not have symptoms.  This condition is diagnosed with blood tests as well as a medical history and physical exam. Other tests may be needed.  Treatment for this condition depends on the cause of the anemia. This information is not intended to replace advice   given to you by your health care provider. Make sure you discuss any questions you have with your health care provider. Document Released: 02/08/2004 Document Revised: 02/02/2016 Document Reviewed: 02/02/2016 Elsevier Interactive Patient Education  [employer].

## 2017-06-10 NOTE — Progress Notes (Signed)
   Subjective:    Patient ID: Anna Salinas, female    DOB: July 09, 1970, 47 y.o.   MRN: [id number]   Chief Complaint: Hospitalization Follow-up (Had arm and chest pain )   HPI Patient comes in today for hospital follow up. patient had been experiencing sob and body aches. She developed chest pain so she went to ER. It was discovered that she was anemic. Her hgb was 6.6 on admission. She was transfused x2 and hgb was 8.8 upon discharge. She has not been taking iron supplements because no one told her to. She was discharged 1 week ago. Still c/o fatigue.   Indication: valve replacement Bleeding signs/symptoms: None Thromboembolic signs/symptoms: None  Missed Coumadin doses: None Medication changes: no Dietary changes: no Bacterial/viral infection: no Other concerns: no     Review of Systems  Constitutional: Positive for fatigue.  Respiratory: Negative.   Cardiovascular: Negative.   Genitourinary: Negative.   Neurological: Negative.   Psychiatric/Behavioral: Negative.   All other systems reviewed and are negative.      Objective:   Physical Exam  Constitutional: She appears well-developed and well-nourished. No distress.  Cardiovascular: Normal rate.  Pulmonary/Chest: Effort normal.  Neurological: She is alert.  Skin: Skin is warm.  Psychiatric: She has a normal mood and affect. Her behavior is normal. Judgment and thought content normal.   BP 126/78   Pulse 78   Temp (!) 97.1 F (36.2 C) (Oral)   Ht '5\' 8"'$  (1.727 m)   Wt 241 lb (109.3 kg)   BMI 36.64 kg/m    INR 2.1    hgb 8.5 Assessment & Plan:  Anna Salinas in today with chief complaint of Hospitalization Follow-up (Had arm and chest pain )   1. S/P AVR (aortic valve replacement) - CoaguChek XS/INR Waived  2. Symptomatic anemia Patient will recehck on Monday- if improving will talk with patient when I get back in town. If dropping then will ned to speak with cardiology about stopping coumadin and  rfeer to GI for evaluation. - CBC with Differential/Platelet - BMP8+EGFR - Hemoglobin, fingerstick - Fe Fum-FA-B Cmp-C-Zn-Mg-Mn-Cu (HEMOCYTE PLUS) 106-1 MG CAPS; Take 1 tablet by mouth daily at 12 noon.  Dispense: 30 each; Refill: 2  3. Hospital discharge follow-up Hospital records reviewed  [place], [place]

## 2017-06-11 ENCOUNTER — Telehealth: Payer: Self-pay | Admitting: Cardiology

## 2017-06-11 LAB — BMP8+EGFR
BUN/Creatinine Ratio: 14 (ref 9–23)
BUN: 10 mg/dL (ref 6–24)
CALCIUM: 8.7 mg/dL (ref 8.7–10.2)
CHLORIDE: 99 mmol/L (ref 96–106)
CO2: 23 mmol/L (ref 20–29)
Creatinine, Ser: 0.71 mg/dL (ref 0.57–1.00)
GFR calc Af Amer: 120 mL/min/{1.73_m2} (ref >59)
GFR, EST NON AFRICAN AMERICAN: 104 mL/min/{1.73_m2} (ref >59)
GLUCOSE: 257 mg/dL — AB (ref 65–99)
POTASSIUM: 4 mmol/L (ref 3.5–5.2)
SODIUM: 136 mmol/L (ref 134–144)

## 2017-06-11 LAB — CBC WITH DIFFERENTIAL/PLATELET
BASOS ABS: 0.1 10*3/uL (ref 0.0–0.2)
Basos: 1 %
EOS (ABSOLUTE): 0.2 10*3/uL (ref 0.0–0.4)
Eos: 2 %
HEMATOCRIT: 31.1 % — AB (ref 34.0–46.6)
Hemoglobin: 8.7 g/dL — CL (ref 11.1–15.9)
IMMATURE GRANULOCYTES: 0 %
Immature Grans (Abs): 0 10*3/uL (ref 0.0–0.1)
LYMPHS ABS: 1.9 10*3/uL (ref 0.7–3.1)
Lymphs: 27 %
MCH: 21 pg — ABNORMAL LOW (ref 26.6–33.0)
MCHC: 28 g/dL — AB (ref 31.5–35.7)
MCV: 75 fL — ABNORMAL LOW (ref 79–97)
MONOCYTES: 6 %
MONOS ABS: 0.4 10*3/uL (ref 0.1–0.9)
NEUTROS PCT: 64 %
Neutrophils Absolute: 4.6 10*3/uL (ref 1.4–7.0)
PLATELETS: 126 10*3/uL — AB (ref 150–450)
RBC: 4.15 x10E6/uL (ref 3.77–5.28)
RDW: 19 % — AB (ref 12.3–15.4)
WBC: 7.1 10*3/uL (ref 3.4–10.8)

## 2017-06-11 NOTE — Telephone Encounter (Signed)
Error  No Needed

## 2017-06-13 ENCOUNTER — Ambulatory Visit: Payer: BLUE CROSS/BLUE SHIELD | Admitting: Cardiology

## 2017-06-13 ENCOUNTER — Encounter: Payer: Self-pay | Admitting: Cardiology

## 2017-06-13 VITALS — BP 120/70 | HR 80 | Ht 68.0 in | Wt 237.0 lb

## 2017-06-13 DIAGNOSIS — Z952 Presence of prosthetic heart valve: Secondary | ICD-10-CM | POA: Diagnosis not present

## 2017-06-13 DIAGNOSIS — I10 Essential (primary) hypertension: Secondary | ICD-10-CM

## 2017-06-13 NOTE — Patient Instructions (Signed)
Continue your current therapy  We will update an Echocardiogram  I will see you in 6 months.

## 2017-06-13 NOTE — Progress Notes (Signed)
CARDIOLOGY OFFICE NOTE  Date:  06/13/2017    Anna Salinas Date of Birth: Mar 09, 1970 Medical Record #[id number]  PCP:  Chevis Pretty, FNP  Cardiologist:  Ozetta Flatley Martinique  MD  No chief complaint on file.    History of Present Illness: Anna Salinas is a 47 y.o. female who presents for follow up AVR.   She has a history of AVR in 02/2014 for severe AS with a bicuspid AV. She is on chronic coumadin.  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 These were drained. Repeat CXR and Echo in April 2016 showed resolution of effusions and normal valve function.   In Dec 2017 she was complaining of fatigue with periods of exhaustion. No specific cardiac cause found. Felt to be related to poorly controlled DM with A1c 10.5%. She was started on insulin along with metformin.   More recently she was admitted with severe anemia related to heavy menstrual bleeding. Hgb down to 6.6. Transfused 2 units PRBCs and Hgb improved to 8.8. Last week it was 8.3 with primary care. She did have placement of IUD in April to try and help with the heavy menses. During hospitalization she was noted to have elevated troponin to 0.48. This was felt to be related to demand ischemia secondary to anemia. Prior to admission she noted severe fatigue, dyspnea, and arm pain. This has improved. She also had some swelling that has improved. INR has been therapeutic.   Past Medical History:  Diagnosis Date  . Aortic stenosis due to bicuspid aortic valve   . Essential hypertension, benign   . Palpitations   . Type 2 diabetes mellitus ([place])     Past Surgical History:  Procedure Laterality Date  . AORTIC VALVE REPLACEMENT N/A 02/14/2014   Procedure: AORTIC VALVE REPLACEMENT (AVR);  Surgeon: Gaye Pollack, MD;  Location: [place];  Service: Open Heart Surgery;  Laterality: N/A;  . CARDIAC CATHETERIZATION    . CESAREAN SECTION  2009  . CHEST TUBE INSERTION Right 03/27/2014   Procedure: CHEST TUBE INSERTION;  Surgeon: Grace Isaac, MD;  Location: [place];  Service: Thoracic;  Laterality: Right;  . INTRAOPERATIVE TRANSESOPHAGEAL ECHOCARDIOGRAM N/A 02/14/2014   Procedure: INTRAOPERATIVE TRANSESOPHAGEAL ECHOCARDIOGRAM;  Surgeon: Gaye Pollack, MD;  Location: [facility] OR;  Service: Open Heart Surgery;  Laterality: N/A;  . LEFT AND RIGHT HEART CATHETERIZATION WITH CORONARY ANGIOGRAM N/A 01/25/2014   Procedure: LEFT AND RIGHT HEART CATHETERIZATION WITH CORONARY ANGIOGRAM;  Surgeon: Harpreet Signore M Martinique, MD;  Location[facility] C[facility] LAB;  Service: Cardiovascular;  Laterality: N/A;  . PERICARDIAL FLUID DRAINAGE N/A 03/27/2014   Procedure: DRAINAGE OF PERICARDIAL FLUID;  Surgeon: Grace Isaac, MD;  Location: [place];  Service: Thoracic;  Laterality: N/A;  . PLEURAL EFFUSION DRAINAGE Right 03/27/2014   Procedure: DRAINAGE OF PLEURAL EFFUSION;  Surgeon: Grace Isaac, MD;  Location: [place];  Service: Thoracic;  Laterality: Right;  Drainage of right pleural effusion  . SUBXYPHOID PERICARDIAL WINDOW N/A 03/27/2014   Procedure: SUBXYPHOID PERICARDIAL WINDOW;  Surgeon: Grace Isaac, MD;  Location: [facility] OR;  Service: Thoracic;  Laterality: N/A;  . TUBAL LIGATION       Medications: Current Outpatient Medications  Medication Sig Dispense Refill  . Fe Fum-FA-B Cmp-C-Zn-Mg-Mn-Cu (HEMOCYTE PLUS) 106-1 MG CAPS Take 1 tablet by mouth daily at 12 noon. 30 each 2  . glucose blood (ONETOUCH VERIO) test strip Use to check BG up to bid. 100 each 5  .  hyoscyamine (LEVSIN SL) 0.125 MG SL tablet Place 1 tablet (0.125 mg total) under the tongue every 6 (six) hours as needed. 30 tablet 1  . Insulin Degludec (TRESIBA FLEXTOUCH) 200 UNIT/ML SOPN Inject 56 Units into the skin daily. 15 pen 1  . Insulin Pen Needle (DROPLET PEN NEEDLES) 32G X 4 MM MISC Use with insulin pen daily as directed 100 each 2  . metFORMIN (GLUCOPHAGE XR) 500 MG 24 hr tablet Take 1 tablet (500 mg total) by mouth 2 (two) times daily.    [name] DELICA LANCETS FINE MISC Use to check BG up to bid 100 each 5  . ranitidine (ZANTAC) 150 MG tablet Take 1 tablet (150 mg total) by mouth daily. 90 tablet 1  . triamcinolone cream (KENALOG) 0.1 % Apply 1 application topically 2 (two) times daily. 453.6 g 1  . warfarin (COUMADIN) 5 MG tablet TAKE 1 TABLET BY MOUTH DAILY OR AS DIRECTED BY COUMADIN CLINIC 90 tablet 2   No current facility-administered medications for this visit.     Allergies: Allergies  Allergen Reactions  . Glipizide Nausea And Vomiting and Other (See Comments)    GI upset  . Metoprolol Other (See Comments)    "Made me sick"  . Shellfish Allergy Swelling    Throat and eyes were swollen.  Had difficulty breathing.  Was hospitalized.      Social History: The patient  reports that she has never smoked. She has never used smokeless tobacco. She reports that she does not drink alcohol or use drugs.   Family History: The patient's family history includes Brain cancer in her brother; Colon cancer in her maternal grandmother; Diabetes in her mother.   Review of Systems: Please see the history of present illness.  Glucose has been elevated.  All other systems are reviewed and negative.   Physical Exam: VS:  BP 120/70   Pulse 80   Ht 5\' 8"  (1.727 m)   Wt 237 lb (107.5 kg)   SpO2 99%   BMI 36.04 kg/m  .  BMI Body mass index is 36.04 kg/m.  Wt Readings from Last 3 Encounters:  06/13/17 237 lb (107.5 kg)  06/10/17 241 lb (109.3 kg)  06/01/17 241 lb 6.5 oz (109.5 kg)   GENERAL:  Well appearing obese WF in NAD HEENT:  PERRL, EOMI, sclera are clear. Oropharynx is clear. NECK:  No jugular venous distention, carotid upstroke brisk and symmetric, no bruits, no thyromegaly or adenopathy LUNGS:  Clear to auscultation bilaterally CHEST:  Unremarkable HEART:  RRR,  PMI not displaced or sustained,good mechanical S2. , no S3, no S4: no clicks, no rubs, no murmurs ABD:  Soft, nontender. BS +, no masses or bruits. No  hepatomegaly, no splenomegaly EXT:  2 + pulses throughout, no edema, no cyanosis no clubbing SKIN:  Warm and dry.  No rashes NEURO:  Alert and oriented x 3. Cranial nerves II through XII intact. PSYCH:  Cognitively intact       LABORATORY DATA:  EKG:  EKG is not ordered today. Reviewed from 05/31/17 and was normal. I have personally reviewed and interpreted this study.     Lab Results  Component Value Date   WBC 7.1 06/10/2017   HGB 8.7 (LL) 06/10/2017   HCT 31.1 (L) 06/10/2017   PLT 126 (L) 06/10/2017   GLUCOSE 257 (H) 06/10/2017   CHOL 145 12/17/2016   TRIG 100 12/17/2016   HDL 33 (L) 12/17/2016   LDLCALC 92 12/17/2016   ALT  29 12/17/2016   AST 34 12/17/2016   NA 136 06/10/2017   K 4.0 06/10/2017   CL 99 06/10/2017   CREATININE 0.71 06/10/2017   BUN 10 06/10/2017   CO2 23 06/10/2017   TSH 0.93 01/03/2016   INR 2.1 (H) 06/10/2017   HGBA1C 10.5 (H) 01/03/2016   MICROALBUR negative 04/14/2014   Last A1c Dec 2018 was 7.9%   BNP (last 3 results) Recent Labs    05/31/17 2159  BNP 84.0    ProBNP (last 3 results) No results for input(s): PROBNP in the last 8760 hours.   Other Studies Reviewed Today:   Echo: 03/26/2014 Study Conclusions - Left ventricle: The cavity size was normal. Wall thickness was normal. Systolic function was vigorous. The estimated ejection fraction was in the range of 65% to 70%. Wall motion was normal; there were no regional wall motion abnormalities. - Aortic valve: A [employer] mechanical prosthesis was present. No obvious perivalvular leak based on limited views. There was no significant regurgitation. Mean gradient (S): 15 mm Hg. Peak gradient (S): 28 mm Hg. - Right ventricle: The cavity size was below normal. - Tricuspid valve: There was trivial regurgitation. - Pulmonary arteries: Systolic pressure could not be accurately estimated. - Pericardium, extracardiac: A large pericardial effusion  was identified circumferential to the heart, measures approximately 4 cm posteriorly and 3 cm anteriorly. There is compression of the right ventricle noted in the subcostal view and further evidence of tamponade physiology based on respiratory variation in mitral inflow. There was a right pleural effusion. Impressions: - Normal LV wall thickness with LVEF 65-70%. St. Jude mechanical AVR without obvious perivalvular leak based on limited and with grossly normal gradients. A large pericardial effusion was identified circumferential to the heart, measures approximately 4 cm posteriorly and 3-4 cm anteriorly. There is compression of the right ventricle noted in the subcostal view and further evidence of tamponade physiology based on respiratory variation in mitral inflow. Right pleural effusion also noted. Results discussed with Dr. Servando Snare.  Assessment/Plan: 1. S/p AVR. Needs long term coumadin. Reviewed recommendations for SBE prophylaxis. If menstrual bleeding does not improve with IUD could consider hysterectomy.   2. DM- poorly controlled but improving. Per primary care.   3. Chronic coumadin therapy. INR monitored by primary care.   4. Severe anemia secondary to menstrual bleeding. Follow up with GYN  5. Elevated troponin. Due to demand of anemia. Normal Coronary arteries in 2016. We will update Echo to assess LV function.  Current medicines are reviewed with the patient today.  The patient does not have concerns regarding medicines other than what has been noted above.  The following changes have been made:  See above.  Labs/ tests ordered today include:    Orders Placed This Encounter  Procedures  . ECHOCARDIOGRAM COMPLETE     Disposition:   FU with me in 6 months.  Signed: Tandy Grawe Martinique MD, [facility]    06/13/2017 9:56 AM

## 2017-06-16 ENCOUNTER — Other Ambulatory Visit: Payer: BLUE CROSS/BLUE SHIELD

## 2017-06-16 DIAGNOSIS — N939 Abnormal uterine and vaginal bleeding, unspecified: Secondary | ICD-10-CM | POA: Diagnosis not present

## 2017-06-16 DIAGNOSIS — Z6836 Body mass index (BMI) 36.0-36.9, adult: Secondary | ICD-10-CM | POA: Diagnosis not present

## 2017-06-16 DIAGNOSIS — D649 Anemia, unspecified: Secondary | ICD-10-CM

## 2017-06-16 LAB — HEMOGLOBIN, FINGERSTICK: Hemoglobin: 9.2 g/dL — ABNORMAL LOW (ref 11.1–15.9)

## 2017-06-18 DIAGNOSIS — D500 Iron deficiency anemia secondary to blood loss (chronic): Secondary | ICD-10-CM | POA: Diagnosis not present

## 2017-06-19 ENCOUNTER — Ambulatory Visit (HOSPITAL_COMMUNITY): Payer: BLUE CROSS/BLUE SHIELD | Attending: Cardiology

## 2017-06-19 ENCOUNTER — Other Ambulatory Visit: Payer: Self-pay

## 2017-06-19 DIAGNOSIS — R002 Palpitations: Secondary | ICD-10-CM | POA: Insufficient documentation

## 2017-06-19 DIAGNOSIS — E119 Type 2 diabetes mellitus without complications: Secondary | ICD-10-CM | POA: Diagnosis not present

## 2017-06-19 DIAGNOSIS — I10 Essential (primary) hypertension: Secondary | ICD-10-CM | POA: Diagnosis not present

## 2017-06-19 DIAGNOSIS — I082 Rheumatic disorders of both aortic and tricuspid valves: Secondary | ICD-10-CM | POA: Diagnosis not present

## 2017-06-19 DIAGNOSIS — Z952 Presence of prosthetic heart valve: Secondary | ICD-10-CM | POA: Diagnosis not present

## 2017-06-25 DIAGNOSIS — D500 Iron deficiency anemia secondary to blood loss (chronic): Secondary | ICD-10-CM | POA: Diagnosis not present

## 2017-06-30 DIAGNOSIS — N939 Abnormal uterine and vaginal bleeding, unspecified: Secondary | ICD-10-CM | POA: Diagnosis not present

## 2017-06-30 DIAGNOSIS — N83291 Other ovarian cyst, right side: Secondary | ICD-10-CM | POA: Diagnosis not present

## 2017-07-01 DIAGNOSIS — N939 Abnormal uterine and vaginal bleeding, unspecified: Secondary | ICD-10-CM | POA: Diagnosis not present

## 2017-07-01 DIAGNOSIS — Z30430 Encounter for insertion of intrauterine contraceptive device: Secondary | ICD-10-CM | POA: Diagnosis not present

## 2017-07-01 DIAGNOSIS — Z6836 Body mass index (BMI) 36.0-36.9, adult: Secondary | ICD-10-CM | POA: Diagnosis not present

## 2017-07-01 DIAGNOSIS — T8389XA Other specified complication of genitourinary prosthetic devices, implants and grafts, initial encounter: Secondary | ICD-10-CM | POA: Diagnosis not present

## 2017-07-03 ENCOUNTER — Ambulatory Visit: Payer: BLUE CROSS/BLUE SHIELD | Admitting: Nurse Practitioner

## 2017-07-03 ENCOUNTER — Encounter: Payer: Self-pay | Admitting: Nurse Practitioner

## 2017-07-03 VITALS — BP 118/66 | HR 63 | Temp 97.9°F | Ht 68.0 in | Wt 243.0 lb

## 2017-07-03 DIAGNOSIS — Z952 Presence of prosthetic heart valve: Secondary | ICD-10-CM

## 2017-07-03 DIAGNOSIS — D649 Anemia, unspecified: Secondary | ICD-10-CM

## 2017-07-03 LAB — HEMOGLOBIN, FINGERSTICK: Hemoglobin: 10.6 g/dL — ABNORMAL LOW (ref 11.1–15.9)

## 2017-07-03 LAB — COAGUCHEK XS/INR WAIVED
INR: 2.6 — ABNORMAL HIGH (ref 0.9–1.1)
Prothrombin Time: 31.2 s

## 2017-07-03 NOTE — Progress Notes (Signed)
Subjective:   Chief Complaint: INR check and hgb check   Indication: valve replacement Bleeding signs/symptoms: Yes - heavy menses and she had to have 2 units of blood a month ago. Thromboembolic signs/symptoms: None  Missed Coumadin doses: None Medication changes: no Dietary changes: no Bacterial/viral infection: no Other concerns: none other then just watching hgb- patient menses started this  Morning so we will need to watch hgb  The following portions of the patient's history were reviewed and updated as appropriate: allergies, current medications, past family history, past medical history, past social history, past surgical history and problem list.  Review of Systems Pertinent items noted in HPI and remainder of comprehensive ROS otherwise negative.   Objective:    INR Today: 2.6  hgb 10.6 up from 9.2 ( had iron infusion last week) Current dose: coumadin 5mg  daily except for 2.5mg  on s,t,and th    Assessment:    Therapeutic INR for goal of 2-3   Plan:    1. New dose: no change   2. Next INR: 1 month   Recheck hgb in 2 weeks  Mary-Margaret Hassell Done, FNP

## 2017-07-03 NOTE — Addendum Note (Signed)
Addended by: Chevis Pretty on: 07/03/2017 04:47 PM   Modules accepted: Level of Service

## 2017-07-21 ENCOUNTER — Other Ambulatory Visit: Payer: BLUE CROSS/BLUE SHIELD

## 2017-07-21 DIAGNOSIS — D649 Anemia, unspecified: Secondary | ICD-10-CM

## 2017-07-21 LAB — HEMOGLOBIN, FINGERSTICK: Hemoglobin: 12.5 g/dL (ref 11.1–15.9)

## 2017-07-23 DIAGNOSIS — N938 Other specified abnormal uterine and vaginal bleeding: Secondary | ICD-10-CM | POA: Diagnosis not present

## 2017-07-23 DIAGNOSIS — N719 Inflammatory disease of uterus, unspecified: Secondary | ICD-10-CM | POA: Diagnosis not present

## 2017-08-08 ENCOUNTER — Other Ambulatory Visit: Payer: Self-pay | Admitting: Nurse Practitioner

## 2017-08-08 ENCOUNTER — Other Ambulatory Visit: Payer: Self-pay

## 2017-08-08 DIAGNOSIS — E119 Type 2 diabetes mellitus without complications: Secondary | ICD-10-CM

## 2017-08-08 MED ORDER — INSULIN DEGLUDEC 200 UNIT/ML ~~LOC~~ SOPN: 55.0000 [IU] | PEN_INJECTOR | Freq: Every day | SUBCUTANEOUS | 0 refills | Status: DC

## 2017-08-08 NOTE — Telephone Encounter (Signed)
What is the name of the medication? [name] and Anna Salinas patient is almost out   Have you contacted your pharmacy to request a refill? yes  Which pharmacy would you like this sent to? [facility]   Patient notified that their request is being sent to the clinical staff for review and that they should receive a call once it is complete. If they do not receive a call within 24 hours they can check with their pharmacy or our office.

## 2017-08-08 NOTE — Telephone Encounter (Signed)
tresiba filled  Coumadin done in May #90 2 RF's  Should not need

## 2017-08-13 NOTE — Telephone Encounter (Signed)
Advised pt she was due for f/u visit a few weeks ago. Pt scheduled to come see MMM 08/15/17 at 12:30 for INR and she states she does have enough Warfarin to get her to her appt.

## 2017-08-14 ENCOUNTER — Other Ambulatory Visit: Payer: Self-pay | Admitting: Nurse Practitioner

## 2017-08-14 ENCOUNTER — Other Ambulatory Visit: Payer: Self-pay | Admitting: *Deleted

## 2017-08-14 DIAGNOSIS — E119 Type 2 diabetes mellitus without complications: Secondary | ICD-10-CM

## 2017-08-14 MED ORDER — INSULIN DEGLUDEC 200 UNIT/ML ~~LOC~~ SOPN: 56.0000 [IU] | PEN_INJECTOR | Freq: Every day | SUBCUTANEOUS | 0 refills | Status: DC

## 2017-08-14 NOTE — Progress Notes (Signed)
Fax received today from [facility] requires a 90 day supply TC to pharmacy 30 mls is a 90 day supply Corrected this in our system for next refill, did not send to pharmacy they had prescriptions that they could fill this from

## 2017-08-15 ENCOUNTER — Ambulatory Visit: Payer: BLUE CROSS/BLUE SHIELD | Admitting: Nurse Practitioner

## 2017-08-15 ENCOUNTER — Encounter: Payer: Self-pay | Admitting: Nurse Practitioner

## 2017-08-15 VITALS — BP 123/70 | HR 74 | Temp 97.6°F | Ht 68.0 in | Wt 232.0 lb

## 2017-08-15 DIAGNOSIS — D649 Anemia, unspecified: Secondary | ICD-10-CM

## 2017-08-15 DIAGNOSIS — Z7901 Long term (current) use of anticoagulants: Secondary | ICD-10-CM | POA: Diagnosis not present

## 2017-08-15 LAB — HEMOGLOBIN, FINGERSTICK: HEMOGLOBIN: 14.3 g/dL (ref 11.1–15.9)

## 2017-08-15 LAB — COAGUCHEK XS/INR WAIVED
INR: 3.7 — ABNORMAL HIGH (ref 0.9–1.1)
Prothrombin Time: 44.1 s

## 2017-08-15 MED ORDER — WARFARIN SODIUM 5 MG PO TABS: ORAL_TABLET | ORAL | 2 refills | Status: DC

## 2017-08-15 NOTE — Progress Notes (Signed)
Subjective:  Chief Complaint: INR recheck    Indication: valve replacement Bleeding signs/symptoms: None Thromboembolic signs/symptoms: None  Missed Coumadin doses: None Medication changes: no Dietary changes: no Bacterial/viral infection: no Other concerns: no  The following portions of the patient's history were reviewed and updated as appropriate: allergies, current medications, past family history, past medical history, past social history, past surgical history and problem list.  Review of Systems Pertinent items noted in HPI and remainder of comprehensive ROS otherwise negative.   Objective:    INR Today: 3.7 Current dose: coumadin 2.5mg  daily except 5mg  m,w and f.     Assessment:    Supratherapeutic INR for goal of 2-3   Plan:    1. New dose: hold today and 2.5mg  on Monday instead of 5mg  then back to 2.5mg  daily except 5mg  m,w, and f.   2. Next INR: august 13  Chevis Pretty, [place]

## 2017-08-26 ENCOUNTER — Encounter: Payer: Self-pay | Admitting: Nurse Practitioner

## 2017-08-26 ENCOUNTER — Other Ambulatory Visit: Payer: Self-pay

## 2017-08-26 ENCOUNTER — Ambulatory Visit: Payer: BLUE CROSS/BLUE SHIELD | Admitting: Nurse Practitioner

## 2017-08-26 VITALS — BP 121/73 | HR 76 | Temp 98.0°F | Ht 68.0 in | Wt 232.0 lb

## 2017-08-26 DIAGNOSIS — Z952 Presence of prosthetic heart valve: Secondary | ICD-10-CM

## 2017-08-26 LAB — COAGUCHEK XS/INR WAIVED
INR: 2.4 — ABNORMAL HIGH (ref 0.9–1.1)
Prothrombin Time: 28.9 s

## 2017-08-26 MED ORDER — INSULIN PEN NEEDLE 32G X 4 MM MISC: 2 refills | Status: DC

## 2017-08-26 NOTE — Addendum Note (Signed)
Addended by: Rolena Infante on: 08/26/2017 01:45 PM   Modules accepted: Orders

## 2017-08-26 NOTE — Progress Notes (Signed)
Subjective:    Chief Complaint:  INR recheck   Indication: aortic valve replacement Bleeding signs/symptoms: None Thromboembolic signs/symptoms: None  Missed Coumadin doses: None Medication changes: no Dietary changes: no Bacterial/viral infection: no Other concerns: no  The following portions of the patient's history were reviewed and updated as appropriate: allergies, current medications, past family history, past medical history, past social history, past surgical history and problem list.  Review of Systems Pertinent items noted in HPI and remainder of comprehensive ROS otherwise negative.   Objective:    INR Today: 2.4 Current dose: coumadin 2.5mg  daily except 5mg  on W,F    Assessment:    Therapeutic INR for goal of 2-3   Plan:    1. New dose: no change   2. Next INR: 1 month    Mary-Margaret Hassell Done, FNP

## 2017-09-12 ENCOUNTER — Telehealth: Payer: Self-pay | Admitting: Cardiology

## 2017-09-12 NOTE — Telephone Encounter (Signed)
Error no note needed °

## 2017-09-23 ENCOUNTER — Telehealth: Payer: Self-pay | Admitting: *Deleted

## 2017-09-23 NOTE — Telephone Encounter (Signed)
   [facility] Pre-operative Risk Assessment    Request for surgical clearance:  1. What type of surgery is being performed? laparoscopic hysterectomy   2. When is this surgery scheduled? TBD   3. What type of clearance is required (medical clearance vs. Pharmacy clearance to hold med vs. Both)? both  4. Are there any medications that need to be held prior to surgery and how long? coumadin   5. Practice name and name of physician performing surgery?  [facility] Dr. Terri Piedra   6. What is your office phone number [phone]    7.   What is your office fax number [phone]  8.   Anesthesia type (None, local, MAC, general) ?    Maris Bena A Shaka Cardin 09/23/2017, 5:16 PM  _________________________________________________________________   (provider comments below)

## 2017-09-24 ENCOUNTER — Other Ambulatory Visit: Payer: Self-pay | Admitting: Nurse Practitioner

## 2017-09-24 DIAGNOSIS — E119 Type 2 diabetes mellitus without complications: Secondary | ICD-10-CM

## 2017-09-24 MED ORDER — METFORMIN HCL ER 500 MG PO TB24: 500.0000 mg | ORAL_TABLET | Freq: Two times a day (BID) | ORAL | 0 refills | Status: DC

## 2017-09-24 NOTE — Telephone Encounter (Signed)
OV 10/16/17

## 2017-09-25 ENCOUNTER — Ambulatory Visit: Payer: BLUE CROSS/BLUE SHIELD | Admitting: Cardiology

## 2017-09-25 NOTE — Telephone Encounter (Addendum)
   Primary Cardiologist: Peter Martinique, MD  Chart reviewed as part of pre-operative protocol coverage. Patient was contacted 09/26/2017 in reference to pre-operative risk assessment for pending surgery as outlined below.  CHRISTEEN LAI was last seen on 06/13/2017 by Dr. Martinique.  Since that day, Anna Salinas has done well.  She is working 40 hours a week, active around the house and with her kids, not having any chest pain or shortness of breath.  Her EF was normal by recent echo.  Therefore, based on ACC/AHA guidelines, the patient would be at acceptable risk for the planned procedure without further cardiovascular testing.   I will route this recommendation to the requesting party via Epic fax function and remove from pre-op pool.  Please call with questions.  Rosaria Ferries, PA-C 09/26/2017, 9:12 AM

## 2017-09-25 NOTE — Telephone Encounter (Signed)
Patient with diagnosis of aortic valve replacement on warfarin for anticoagulation.    Procedure: laparoscopic hysterectomy Date of procedure: TBD  CrCl 166 (with actual body weight); 64 with IBW Platelet count 126  Per office protocol, patient can hold warfarin for 5 days prior to procedure.   Patient will not need bridging with Lovenox (enoxaparin) around procedure.  Patient should restart warfarin on the evening of procedure or day after, at discretion of procedure MD

## 2017-09-25 NOTE — Telephone Encounter (Signed)
Edit of pharmacy clearance - CrCl with IBW is 100 not 64.

## 2017-09-26 ENCOUNTER — Telehealth: Payer: Self-pay | Admitting: Nurse Practitioner

## 2017-09-26 NOTE — Telephone Encounter (Signed)
Pt aware refill sent in on 09/24/17 Has appt on 10/16/17

## 2017-10-01 ENCOUNTER — Other Ambulatory Visit: Payer: Self-pay | Admitting: Nurse Practitioner

## 2017-10-01 DIAGNOSIS — E119 Type 2 diabetes mellitus without complications: Secondary | ICD-10-CM

## 2017-10-01 MED ORDER — METFORMIN HCL ER 500 MG PO TB24: 500.0000 mg | ORAL_TABLET | Freq: Two times a day (BID) | ORAL | 0 refills | Status: DC

## 2017-10-01 NOTE — Telephone Encounter (Signed)
rf sent 

## 2017-10-16 ENCOUNTER — Telehealth: Payer: Self-pay | Admitting: Nurse Practitioner

## 2017-10-16 ENCOUNTER — Ambulatory Visit (INDEPENDENT_AMBULATORY_CARE_PROVIDER_SITE_OTHER): Payer: BLUE CROSS/BLUE SHIELD | Admitting: Nurse Practitioner

## 2017-10-16 ENCOUNTER — Encounter: Payer: Self-pay | Admitting: Nurse Practitioner

## 2017-10-16 VITALS — BP 117/66 | HR 74 | Temp 97.9°F | Ht 68.0 in | Wt 230.0 lb

## 2017-10-16 DIAGNOSIS — Z23 Encounter for immunization: Secondary | ICD-10-CM | POA: Diagnosis not present

## 2017-10-16 DIAGNOSIS — E119 Type 2 diabetes mellitus without complications: Secondary | ICD-10-CM | POA: Diagnosis not present

## 2017-10-16 DIAGNOSIS — Z794 Long term (current) use of insulin: Secondary | ICD-10-CM

## 2017-10-16 DIAGNOSIS — D649 Anemia, unspecified: Secondary | ICD-10-CM

## 2017-10-16 DIAGNOSIS — K219 Gastro-esophageal reflux disease without esophagitis: Secondary | ICD-10-CM

## 2017-10-16 DIAGNOSIS — Z7901 Long term (current) use of anticoagulants: Secondary | ICD-10-CM | POA: Diagnosis not present

## 2017-10-16 DIAGNOSIS — Z952 Presence of prosthetic heart valve: Secondary | ICD-10-CM

## 2017-10-16 DIAGNOSIS — Z6834 Body mass index (BMI) 34.0-34.9, adult: Secondary | ICD-10-CM

## 2017-10-16 DIAGNOSIS — I10 Essential (primary) hypertension: Secondary | ICD-10-CM

## 2017-10-16 LAB — BAYER DCA HB A1C WAIVED: HB A1C (BAYER DCA - WAIVED): 10.8 % — ABNORMAL HIGH (ref <7.0)

## 2017-10-16 LAB — COAGUCHEK XS/INR WAIVED
INR: 1.7 — ABNORMAL HIGH (ref 0.9–1.1)
Prothrombin Time: 20.1 s

## 2017-10-16 MED ORDER — RANITIDINE HCL 150 MG PO TABS: 150.0000 mg | ORAL_TABLET | Freq: Every day | ORAL | 1 refills | Status: DC

## 2017-10-16 MED ORDER — METFORMIN HCL ER 500 MG PO TB24: 500.0000 mg | ORAL_TABLET | Freq: Two times a day (BID) | ORAL | 0 refills | Status: DC

## 2017-10-16 MED ORDER — INSULIN DEGLUDEC 200 UNIT/ML ~~LOC~~ SOPN: 60.0000 [IU] | PEN_INJECTOR | Freq: Every day | SUBCUTANEOUS | 0 refills | Status: DC

## 2017-10-16 MED ORDER — WARFARIN SODIUM 5 MG PO TABS: ORAL_TABLET | ORAL | 2 refills | Status: DC

## 2017-10-16 NOTE — Telephone Encounter (Signed)
90 day supply sent to pharmacy.  Patient aware

## 2017-10-16 NOTE — Progress Notes (Addendum)
Subjective:    Patient ID: Anna Salinas, female    DOB: Jul 29, 1970, 47 y.o.   MRN: [id number]   Chief Complaint: Medical Management of Chronic Issues   HPI:  1. insulin-requiring or dependent type II diabetes mellitus ([place])  Last hgba1c was 7.9% and that was in December of 2018. She says her blood sugars have ben around 150 fasting. She has been out of meds for a month because the rx was not written for 90 days. No  One informed office so could be corrected.  2. Essential hypertension, benign  No c/o chest pain, sob or headache. Does not check blood pressure at home.  3. Long term current use of anticoagulant therapy  Last INR was 2.4  Indication: atrial fibrillation Bleeding signs/symptoms: None Thromboembolic signs/symptoms: None  Missed Coumadin doses: None Medication changes: no Dietary changes: no Bacterial/viral infection: no Other concerns: no     4. BMI 34.0-34.9,adult  No recent weight changes  5. S/P AVR (aortic valve replacement)  Saw cardiology in June 2019- was doing well- no changes to care- as follow up in december  6. Symptomatic anemia  Will check las today- is on daily iron supplement    Outpatient Encounter Medications as of 10/16/2017  Medication Sig  . glucose blood (ONETOUCH VERIO) test strip Use to check BG up to bid.  . Insulin Degludec (TRESIBA FLEXTOUCH) 200 UNIT/ML SOPN Inject 56 Units into the skin daily.  . Insulin Pen Needle (DROPLET PEN NEEDLES) 32G X 4 MM MISC Use with insulin pen daily as directed  . metFORMIN (GLUCOPHAGE XR) 500 MG 24 hr tablet Take 1 tablet (500 mg total) by mouth 2 (two) times daily.  Anna Salinas DELICA LANCETS FINE MISC Use to check BG up to bid  . ranitidine (ZANTAC) 150 MG tablet Take 1 tablet (150 mg total) by mouth daily.  Marland Kitchen triamcinolone cream (KENALOG) 0.1 % Apply 1 application topically 2 (two) times daily.  Marland Kitchen warfarin (COUMADIN) 5 MG tablet 1/2-1 tablet per day as rx by pcp     New complaints: Patient  is having a hysterectomy Nov 26, 2017  Social history: Lives with husband and son.   Review of Systems  Constitutional: Negative for activity change and appetite change.  HENT: Negative.   Eyes: Negative for pain.  Respiratory: Negative for shortness of breath.   Cardiovascular: Negative for chest pain, palpitations and leg swelling.  Gastrointestinal: Negative for abdominal pain.  Endocrine: Negative for polydipsia.  Genitourinary: Negative.   Skin: Negative for rash.  Neurological: Negative for dizziness, weakness and headaches.  Hematological: Does not bruise/bleed easily.  Psychiatric/Behavioral: Negative.   All other systems reviewed and are negative.      Objective:   Physical Exam  Constitutional: She is oriented to person, place, and time. She appears well-developed and well-nourished. No distress.  HENT:  Head: Normocephalic.  Nose: Nose normal.  Mouth/Throat: Oropharynx is clear and moist.  Eyes: Pupils are equal, round, and reactive to light. EOM are normal.  Neck: Normal range of motion. Neck supple. No JVD present. Carotid bruit is not present.  Cardiovascular: Normal rate, regular rhythm, normal heart sounds and intact distal pulses.  Audible heart valve click  Pulmonary/Chest: Effort normal and breath sounds normal. No respiratory distress. She has no wheezes. She has no rales. She exhibits no tenderness.  Abdominal: Soft. Normal appearance, normal aorta and bowel sounds are normal. She exhibits no distension, no abdominal bruit, no pulsatile midline mass and no mass. There  is no splenomegaly or hepatomegaly. There is no tenderness.  Musculoskeletal: Normal range of motion. She exhibits no edema.  Lymphadenopathy:    She has no cervical adenopathy.  Neurological: She is alert and oriented to person, place, and time. She has normal reflexes.  Skin: Skin is warm and dry.  Psychiatric: She has a normal mood and affect. Her behavior is normal. Judgment and thought  content normal.  Nursing note and vitals reviewed.  BP 117/66   Pulse 74   Temp 97.9 F (36.6 C) (Oral)   Ht _0  (1.727 m)   Wt 230 lb (104.3 kg)   BMI 34.97 kg/m   hgba1c 10.8      Assessment & Plan:  [name] comes in today with chief complaint of Medical Management of Chronic Issues   Diagnosis and orders addressed:  1.  Insulin-requiring or dependent type II diabetes mellitus ([place]) stirciter carb counting Do not run out of meds in future. If need to call in leave me message please do so. Continue t ocheck blood sugars daily - Bayer DCA Hb A1c Waived - Lipid panel - Insulin Degludec (TRESIBA FLEXTOUCH) 200 UNIT/ML SOPN; Inject 60 Units into the skin daily.  Dispense: 30 mL; Refill: 0 - metFORMIN (GLUCOPHAGE XR) 500 MG 24 hr tablet; Take 1 tablet (500 mg total) by mouth 2 (two) times daily.  Dispense: 60 tablet; Refill: 0  2. Essential hypertension, benign Low sodium diet - CMP14+EGFR  3. Long term current use of anticoagulant therapy See anticoag documnetation - CoaguChek XS/INR Waived - warfarin (COUMADIN) 5 MG tablet; 1/2-1 tablet per day as rx by pcp  Dispense: 90 tablet; Refill: 2  4. BMI 34.0-34.9,adult Discussed diet and exercise for person with BMI >25 Will recheck weight in 3-6 months  6. S/P AVR (aortic valve replacement) Keep follow up with cardiology  7. Symptomatic anemia Labs pending  8. Gastroesophageal reflux disease without esophagitis Avoid spicy foods Do not eat 2 hours prior to bedtime - ranitidine (ZANTAC) 150 MG tablet; Take 1 tablet (150 mg total) by mouth daily.  Dispense: 90 tablet; Refill: 1   Labs pending Health Maintenance reviewed Diet and exercise encouraged  Follow up plan: 3 months   Anna Salinas Done, FNP

## 2017-10-16 NOTE — Patient Instructions (Signed)
Bleeding Precautions When on Anticoagulant Therapy  WHAT IS ANTICOAGULANT THERAPY?  Anticoagulant therapy is taking medicine to prevent or reduce blood clots. It is also called blood thinner therapy. Blood clots that form in your blood vessels can be dangerous. They can break loose and travel to your heart, lungs, or brain. This increases your risk of a heart attack or stroke. Anticoagulant therapy causes blood to clot more slowly.  You may need anticoagulant therapy if you have:   A medical condition that increases the likelihood that blood clots will form.   A heart defect or a problem with heart rhythm.  It is also a common treatment after heart surgery, such as valve replacement.  WHAT ARE COMMON TYPES OF ANTICOAGULANT THERAPY?  Anticoagulant medicine can be injected or taken by mouth.If you need anticoagulant therapy quickly at the hospital, the medicine may be injected under your skin or given through an IV tube. Heparin is a common example of an anticoagulant that you may get at the hospital.  Most anticoagulant therapy is in the form of pills that you take at home every day. These may include:   Aspirin. This common blood thinner works by preventing blood cells (platelets) from sticking together to form a clot. Aspirin is not as strong as anticoagulants that slow down the time that it takes for your body to form a clot.   Clopidogrel. This is a newer type of drug that affects platelets. It is stronger than aspirin.   Warfarin. This is the most common anticoagulant. It changes the way your body uses vitamin K, a vitamin that helps your blood to clot. The risk of bleeding is higher with warfarin than with aspirin. You will need frequent blood tests to make sure you are taking the safest amount.   New anticoagulants. Several new drugs have been approved. They are all taken by mouth. Studies show that these drugs work as well as warfarin. They do not require blood testing. They may cause less bleeding  risk than warfarin.  WHAT DO I NEED TO REMEMBER WHEN TAKING ANTICOAGULANT THERAPY?  Anticoagulant therapy decreases your risk of forming a blood clot, but it increases your risk of bleeding. Work closely with your health care provider to make sure you are taking your medicine safely. These tips can help:   Learn ways to reduce your risk of bleeding.   If you are taking warfarin:  ? Have blood tests as ordered by your health care provider.  ? Do not make any sudden changes to your diet. Vitamin K in your diet can make warfarin less effective.  ? Do not get pregnant. This medicine may cause birth defects.   Take your medicine at the same time every day. If you forget to take your medicine, take it as soon as you remember. If you miss a whole day, do not double your dose of medicine. Take your normal dose and call your health care provider to check in.   Do not stop taking your medicine on your own.   Tell your health care provider before you start taking any new medicine, vitamin, or herbal product. Some of these could interfere with your therapy.   Tell all of your health care providers that you are on anticoagulant therapy.   Do not have surgery, medical procedures, or dental work until you tell your health care provider that you are on anticoagulant therapy.  WHAT CAN AFFECT HOW ANTICOAGULANTS WORK?  Certain foods, vitamins, medicines, supplements, and herbal   medicines change the way that anticoagulant therapy works. They may increase or decrease the effects of your anticoagulant therapy. Either result can be dangerous for you.   Many over-the-counter medicines for pain, colds, or stomach problems interfere with anticoagulant therapy. Take these only as told by your health care provider.   Do not drink alcohol. It can interfere with your medicine and increase your risk of an injury that causes bleeding.   If you are taking warfarin, do not begin eating more foods that contain vitamin K. These include  leafy green vegetables. Ask your health care provider if you should avoid any foods.  WHAT ARE SOME WAYS TO PREVENT BLEEDING?  You can prevent bleeding by taking certain precautions:   Be extra careful when you use knives, scissors, or other sharp objects.   Use an electric razor instead of a blade.   Do not use toothpicks.   Use a soft toothbrush.   Wear shoes that have nonskid soles.   Use bath mats and handrails in your bathroom.   Wear gloves while you do yard work.   Wear a helmet when you ride a bike.   Wear your seat belt.   Prevent falls by removing loose rugs and extension cords from areas where you walk.   Do not play contact sports or participate in other activities that have a high risk of injury.  WHEN SHOULD I CONTACT MY HEALTH CARE PROVIDER?  Call your health care provider if:   You miss a dose of medicine:  ? And you are not sure what to do.  ? For more than one day.   You have:  ? Menstrual bleeding that is heavier than normal.  ? Blood in your urine.  ? A bloody nose or bleeding gums.  ? Easy bruising.  ? Blood in your stool (feces) or have black and tarry stool.  ? Side effects from your medicine.   You feel weak or dizzy.   You become pregnant.  Seek immediate medical care if:   You have bleeding that will not stop.   You have sudden and severe headache or belly pain.   You vomit or you cough up bright red blood.   You have a severe blow to your head.  WHAT ARE SOME QUESTIONS TO ASK MY HEALTH CARE PROVIDER?   What is the best anticoagulant therapy for my condition?   What side effects should I watch for?   When should I take my medicine? What should I do if I forget to take it?   Will I need to have regular blood tests?   Do I need to change my diet? Are there foods or drinks that I should avoid?   What activities are safe for me?   What should I do if I want to get pregnant?  This information is not intended to replace advice given to you by your health care provider.  Make sure you discuss any questions you have with your health care provider.  Document Released: 12/12/2014 Document Reviewed: 12/12/2014  Elsevier Interactive Patient Education  2017 [employer]

## 2017-10-17 LAB — CBC WITH DIFFERENTIAL/PLATELET
Basophils Absolute: 0.1 10*3/uL (ref 0.0–0.2)
Basos: 1 %
EOS (ABSOLUTE): 0.2 10*3/uL (ref 0.0–0.4)
EOS: 2 %
Hematocrit: 44.1 % (ref 34.0–46.6)
Hemoglobin: 14.9 g/dL (ref 11.1–15.9)
IMMATURE GRANULOCYTES: 0 %
Immature Grans (Abs): 0 10*3/uL (ref 0.0–0.1)
LYMPHS: 26 %
Lymphocytes Absolute: 2 10*3/uL (ref 0.7–3.1)
MCH: 29 pg (ref 26.6–33.0)
MCHC: 33.8 g/dL (ref 31.5–35.7)
MCV: 86 fL (ref 79–97)
MONOS ABS: 0.4 10*3/uL (ref 0.1–0.9)
Monocytes: 6 %
NEUTROS PCT: 65 %
Neutrophils Absolute: 4.9 10*3/uL (ref 1.4–7.0)
PLATELETS: 149 10*3/uL — AB (ref 150–450)
RBC: 5.14 x10E6/uL (ref 3.77–5.28)
RDW: 13.2 % (ref 12.3–15.4)
WBC: 7.5 10*3/uL (ref 3.4–10.8)

## 2017-10-17 LAB — CMP14+EGFR
A/G RATIO: 1.6 (ref 1.2–2.2)
ALT: 21 IU/L (ref 0–32)
AST: 17 IU/L (ref 0–40)
Albumin: 4.3 g/dL (ref 3.5–5.5)
Alkaline Phosphatase: 49 IU/L (ref 39–117)
BUN/Creatinine Ratio: 13 (ref 9–23)
BUN: 11 mg/dL (ref 6–24)
Bilirubin Total: 0.5 mg/dL (ref 0.0–1.2)
CALCIUM: 9.1 mg/dL (ref 8.7–10.2)
CO2: 22 mmol/L (ref 20–29)
CREATININE: 0.87 mg/dL (ref 0.57–1.00)
Chloride: 98 mmol/L (ref 96–106)
GFR, EST AFRICAN AMERICAN: 93 mL/min/{1.73_m2} (ref >59)
GFR, EST NON AFRICAN AMERICAN: 81 mL/min/{1.73_m2} (ref >59)
GLOBULIN, TOTAL: 2.7 g/dL (ref 1.5–4.5)
Glucose: 318 mg/dL — ABNORMAL HIGH (ref 65–99)
POTASSIUM: 4.2 mmol/L (ref 3.5–5.2)
SODIUM: 135 mmol/L (ref 134–144)
TOTAL PROTEIN: 7 g/dL (ref 6.0–8.5)

## 2017-10-17 LAB — LIPID PANEL
Chol/HDL Ratio: 12.2 ratio — ABNORMAL HIGH (ref 0.0–4.4)
Cholesterol, Total: 244 mg/dL — ABNORMAL HIGH (ref 100–199)
HDL: 20 mg/dL — ABNORMAL LOW (ref >39)
LDL CALC: 184 mg/dL — AB (ref 0–99)
TRIGLYCERIDES: 200 mg/dL — AB (ref 0–149)
VLDL CHOLESTEROL CAL: 40 mg/dL (ref 5–40)

## 2017-10-20 MED ORDER — ROSUVASTATIN CALCIUM 10 MG PO TABS: 10.0000 mg | ORAL_TABLET | Freq: Every day | ORAL | 3 refills | Status: DC

## 2017-10-20 NOTE — Addendum Note (Signed)
Addended by: Chevis Pretty on: 10/20/2017 07:46 AM   Modules accepted: Orders

## 2017-11-06 ENCOUNTER — Ambulatory Visit: Payer: BLUE CROSS/BLUE SHIELD | Admitting: Nurse Practitioner

## 2017-11-07 ENCOUNTER — Ambulatory Visit: Payer: BLUE CROSS/BLUE SHIELD | Admitting: Nurse Practitioner

## 2017-11-07 ENCOUNTER — Encounter: Payer: Self-pay | Admitting: Nurse Practitioner

## 2017-11-07 VITALS — BP 136/83 | HR 71 | Temp 97.6°F | Ht 68.0 in | Wt 230.0 lb

## 2017-11-07 DIAGNOSIS — Z7901 Long term (current) use of anticoagulants: Secondary | ICD-10-CM

## 2017-11-07 LAB — COAGUCHEK XS/INR WAIVED
INR: 1.7 — AB (ref 0.9–1.1)
Prothrombin Time: 20.6 s

## 2017-11-07 NOTE — Progress Notes (Signed)
Subjective:   CHIEF COMPLAINT: INR recheck   Indication: aortic valve replacement Bleeding signs/symptoms: None Thromboembolic signs/symptoms: None  Missed Coumadin doses: had dental work done and had to stop taking for 2 days. Medication changes: no Dietary changes: no Bacterial/viral infection: no Other concerns: no  The following portions of the patient's history were reviewed and updated as appropriate: allergies, current medications, past family history, past medical history, past social history, past surgical history and problem list.  Review of Systems Pertinent items noted in HPI and remainder of comprehensive ROS otherwise negative.   Objective:    INR Today: 1.7 Current dose: coumadin 5mg  daily except 2.5mg  on M,W and F.    Assessment:    Subtherapeutic INR for goal of 2-3   Plan:    1. New dose: coumadin 5mg  daily except 2.5mg  on M and W   2. Next INR: 1 month    Mary-Margaret Hassell Done, FNP

## 2017-11-19 DIAGNOSIS — Z01818 Encounter for other preprocedural examination: Secondary | ICD-10-CM | POA: Diagnosis not present

## 2017-11-23 NOTE — H&P (Signed)
Anna Salinas is an 47 y.o. female [phone] with DUB  For the past three years despite IUD and uterine ablation. Her hx is significant for Atrial valve replacement in 02/2014 for severe AS with bicuspid AV - she is on chronic coumadin. 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 She is a diabetic - on metformin and tresiba. Pt has had to have blood transfusion and iron infusions due to anemia from bleeding  She is interested in definitive treatment options   Pertinent Gynecological History: Menses:irregular Bleeding: dysfunctional uterine bleeding Contraception: tubal ligation DES exposure: unknown Blood transfusions: yes see hpi Sexually transmitted diseases: no past history Previous GYN Procedures: uterine ablation and BTL  Last mammogram: normal Date: 11/2016 Last pap: normal Date: 08/2016 OB History: G3, P3003   Menstrual History: Menarche age: 39 No LMP recorded.    Past Medical History:  Diagnosis Date  . Aortic stenosis due to bicuspid aortic valve   . Essential hypertension, benign   . Palpitations   . Type 2 diabetes mellitus ([place])     Past Surgical History:  Procedure Laterality Date  . AORTIC VALVE REPLACEMENT N/A 02/14/2014   Procedure: AORTIC VALVE REPLACEMENT (AVR);  Surgeon: Gaye Pollack, MD;  Location: [facility];  Service: Open Heart Surgery;  Laterality: N/A;  . CARDIAC CATHETERIZATION    . CESAREAN SECTION  2009  . CHEST TUBE INSERTION Right 03/27/2014   Procedure: CHEST TUBE INSERTION;  Surgeon: Grace Isaac, MD;  Location: [place];  Service: Thoracic;  Laterality: Right;  . INTRAOPERATIVE TRANSESOPHAGEAL ECHOCARDIOGRAM N/A 02/14/2014   Procedure: INTRAOPERATIVE TRANSESOPHAGEAL ECHOCARDIOGRAM;  Surgeon: Gaye Pollack, MD;  Location: [facility] OR;  Service: Open Heart Surgery;  Laterality: N/A;  . LEFT AND RIGHT HEART CATHETERIZATION WITH CORONARY ANGIOGRAM N/A 01/25/2014   Procedure: LEFT  AND RIGHT HEART CATHETERIZATION WITH CORONARY ANGIOGRAM;  Surgeon: Peter M Martinique, MD;  Location: [facility] CATH LAB;  Service: Cardiovascular;  Laterality: N/A;  . PERICARDIAL FLUID DRAINAGE N/A 03/27/2014   Procedure: DRAINAGE OF PERICARDIAL FLUID;  Surgeon: Grace Isaac, MD;  Location: [place];  Service: Thoracic;  Laterality: N/A;  . PLEURAL EFFUSION DRAINAGE Right 03/27/2014   Procedure: DRAINAGE OF PLEURAL EFFUSION;  Surgeon: Grace Isaac, MD;  Location: [facility];  Service: Thoracic;  Laterality: Right;  Drainage of right pleural effusion  . SUBXYPHOID PERICARDIAL WINDOW N/A 03/27/2014   Procedure: SUBXYPHOID PERICARDIAL WINDOW;  Surgeon: Grace Isaac, MD;  Location: [facility] OR;  Service: Thoracic;  Laterality: N/A;  . TUBAL LIGATION      Family History  Problem Relation Age of Onset  . Diabetes Mother   . Colon cancer Maternal Grandmother   . Brain cancer Brother     Social History:  reports that she has never smoked. She has never used smokeless tobacco. She reports that she does not drink alcohol or use drugs.  Allergies:  Allergies  Allergen Reactions  . Glipizide Nausea And Vomiting and Other (See Comments)    GI upset  . Metoprolol Other (See Comments)    "Made me sick"  . Shellfish Allergy Swelling    Throat and eyes were swollen.  Had difficulty breathing.  Was hospitalized.      No medications prior to admission.    ROS  There were no vitals taken for this visit. Physical Exam  No results found for this or any previous visit (from the past 24 hour(s)).  No results found.  Assessment/Plan: 33RX [id number] with DUB failed iud and ablation for treatment, Hx AS with valve replacement, diabetes, opting for definitive treatment via total laparoscopic hysterectomy with bilateral salpingectomy and cystoscopy; possible open Risks and benefits of procedure reviewed  Consent verified To OR when ready  Anna Salinas 11/23/2017, 9:43 AM

## 2017-11-24 ENCOUNTER — Other Ambulatory Visit: Payer: Self-pay

## 2017-11-24 ENCOUNTER — Inpatient Hospital Stay (HOSPITAL_COMMUNITY)
Admission: RE | Admit: 2017-11-24 | Discharge: 2017-11-24 | Disposition: A | Discharge status: Home / Self Care | Payer: BLUE CROSS/BLUE SHIELD | Source: Ambulatory Visit

## 2017-11-24 ENCOUNTER — Encounter (HOSPITAL_COMMUNITY)
Admission: RE | Admit: 2017-11-24 | Discharge: 2017-11-24 | Disposition: A | Discharge status: Home / Self Care | Payer: BLUE CROSS/BLUE SHIELD | Source: Ambulatory Visit | Attending: Obstetrics and Gynecology | Admitting: Obstetrics and Gynecology

## 2017-11-24 ENCOUNTER — Encounter (HOSPITAL_COMMUNITY): Payer: Self-pay

## 2017-11-24 DIAGNOSIS — Z833 Family history of diabetes mellitus: Secondary | ICD-10-CM | POA: Insufficient documentation

## 2017-11-24 DIAGNOSIS — Z7901 Long term (current) use of anticoagulants: Secondary | ICD-10-CM | POA: Insufficient documentation

## 2017-11-24 DIAGNOSIS — N938 Other specified abnormal uterine and vaginal bleeding: Secondary | ICD-10-CM | POA: Insufficient documentation

## 2017-11-24 DIAGNOSIS — E119 Type 2 diabetes mellitus without complications: Secondary | ICD-10-CM | POA: Diagnosis not present

## 2017-11-24 DIAGNOSIS — Z01812 Encounter for preprocedural laboratory examination: Secondary | ICD-10-CM | POA: Diagnosis not present

## 2017-11-24 DIAGNOSIS — Z91013 Allergy to seafood: Secondary | ICD-10-CM | POA: Insufficient documentation

## 2017-11-24 DIAGNOSIS — Z952 Presence of prosthetic heart valve: Secondary | ICD-10-CM | POA: Diagnosis not present

## 2017-11-24 DIAGNOSIS — Z888 Allergy status to other drugs, medicaments and biological substances status: Secondary | ICD-10-CM | POA: Insufficient documentation

## 2017-11-24 DIAGNOSIS — I10 Essential (primary) hypertension: Secondary | ICD-10-CM | POA: Diagnosis not present

## 2017-11-24 LAB — COMPREHENSIVE METABOLIC PANEL
ALK PHOS: 44 U/L (ref 38–126)
ALT: 20 U/L (ref 0–44)
AST: 16 U/L (ref 15–41)
Albumin: 4.2 g/dL (ref 3.5–5.0)
Anion gap: 8 (ref 5–15)
BUN: 11 mg/dL (ref 6–20)
CALCIUM: 8.9 mg/dL (ref 8.9–10.3)
CHLORIDE: 100 mmol/L (ref 98–111)
CO2: 24 mmol/L (ref 22–32)
CREATININE: 0.77 mg/dL (ref 0.44–1.00)
Glucose, Bld: 295 mg/dL — ABNORMAL HIGH (ref 70–99)
Potassium: 3.7 mmol/L (ref 3.5–5.1)
Sodium: 132 mmol/L — ABNORMAL LOW (ref 135–145)
Total Bilirubin: 1 mg/dL (ref 0.3–1.2)
Total Protein: 8.3 g/dL — ABNORMAL HIGH (ref 6.5–8.1)

## 2017-11-24 LAB — CBC
HCT: 47.2 % — ABNORMAL HIGH (ref 36.0–46.0)
Hemoglobin: 15.7 g/dL — ABNORMAL HIGH (ref 12.0–15.0)
MCH: 29.5 pg (ref 26.0–34.0)
MCHC: 33.3 g/dL (ref 30.0–36.0)
MCV: 88.6 fL (ref 80.0–100.0)
NRBC: 0 % (ref 0.0–0.2)
PLATELETS: 158 10*3/uL (ref 150–400)
RBC: 5.33 MIL/uL — AB (ref 3.87–5.11)
RDW: 13.8 % (ref 11.5–15.5)
WBC: 8.5 10*3/uL (ref 4.0–10.5)

## 2017-11-24 LAB — TYPE AND SCREEN
ABO/RH(D): A POS
ANTIBODY SCREEN: NEGATIVE

## 2017-11-24 LAB — ABO/RH: ABO/RH(D): A POS

## 2017-11-24 NOTE — Patient Instructions (Addendum)
  Your procedure is scheduled on:Wedneaday 11/13 at 830am  Enter through the Main Entrance of [facility] Building at:7am  Pick up the phone at the desk and dial [phone].  Call this number if you have problems the morning of surgery: [phone].  Remember: Do NOT eat food or drink any liquids : after mignight on Tuesday Do NOT drink clear liquids after: Midnight Take these medicines the morning of surgery with a SIP OF WATER: None. Hold Metformin for 24 hours before surgery. 1/2 regular dose of Insulin on Tuesday night. No Insulin on Wed morning  Do NOT wear jewelry (body piercing), metal hair clips/bobby pins, make-up, or nail polish. Do NOT wear lotions, powders, or perfumes.  You may wear deoderant. Do NOT shave for 48 hours prior to surgery. Do NOT bring valuables to the hospital. Contacts, dentures, or bridgework may not be worn into surgery. Leave suitcase in car.  After surgery it may be brought to your room.  For patients admitted to the hospital, checkout time is 11:00 AM the day of discharge. Have a responsible adult drive you home and stay with you for 24 hours after your procedure

## 2017-11-26 ENCOUNTER — Ambulatory Visit (HOSPITAL_COMMUNITY)
Admission: RE | Admit: 2017-11-26 | Discharge: 2017-11-27 | Disposition: A | Discharge status: Home / Self Care | Payer: BLUE CROSS/BLUE SHIELD | Source: Ambulatory Visit | Attending: Obstetrics and Gynecology | Admitting: Obstetrics and Gynecology

## 2017-11-26 ENCOUNTER — Inpatient Hospital Stay (HOSPITAL_COMMUNITY): Payer: BLUE CROSS/BLUE SHIELD | Admitting: Anesthesiology

## 2017-11-26 ENCOUNTER — Encounter (HOSPITAL_COMMUNITY)
Admission: RE | Disposition: A | Discharge status: Home / Self Care | Payer: Self-pay | Source: Ambulatory Visit | Attending: Obstetrics and Gynecology

## 2017-11-26 ENCOUNTER — Other Ambulatory Visit: Payer: Self-pay

## 2017-11-26 ENCOUNTER — Encounter (HOSPITAL_COMMUNITY): Payer: Self-pay

## 2017-11-26 DIAGNOSIS — N938 Other specified abnormal uterine and vaginal bleeding: Principal | ICD-10-CM | POA: Insufficient documentation

## 2017-11-26 DIAGNOSIS — D649 Anemia, unspecified: Secondary | ICD-10-CM | POA: Diagnosis not present

## 2017-11-26 DIAGNOSIS — D259 Leiomyoma of uterus, unspecified: Secondary | ICD-10-CM | POA: Insufficient documentation

## 2017-11-26 DIAGNOSIS — N838 Other noninflammatory disorders of ovary, fallopian tube and broad ligament: Secondary | ICD-10-CM | POA: Diagnosis not present

## 2017-11-26 DIAGNOSIS — I10 Essential (primary) hypertension: Secondary | ICD-10-CM | POA: Insufficient documentation

## 2017-11-26 DIAGNOSIS — N840 Polyp of corpus uteri: Secondary | ICD-10-CM | POA: Insufficient documentation

## 2017-11-26 DIAGNOSIS — Z952 Presence of prosthetic heart valve: Secondary | ICD-10-CM | POA: Insufficient documentation

## 2017-11-26 DIAGNOSIS — E1165 Type 2 diabetes mellitus with hyperglycemia: Secondary | ICD-10-CM | POA: Diagnosis not present

## 2017-11-26 DIAGNOSIS — Z48816 Encounter for surgical aftercare following surgery on the genitourinary system: Secondary | ICD-10-CM | POA: Diagnosis not present

## 2017-11-26 DIAGNOSIS — Z23 Encounter for immunization: Secondary | ICD-10-CM | POA: Diagnosis not present

## 2017-11-26 DIAGNOSIS — Z794 Long term (current) use of insulin: Secondary | ICD-10-CM | POA: Diagnosis not present

## 2017-11-26 DIAGNOSIS — Z90710 Acquired absence of both cervix and uterus: Secondary | ICD-10-CM

## 2017-11-26 HISTORY — PX: TOTAL LAPAROSCOPIC HYSTERECTOMY WITH SALPINGECTOMY: SHX6742

## 2017-11-26 HISTORY — DX: Acquired absence of both cervix and uterus: Z90.710

## 2017-11-26 HISTORY — PX: CYSTOSCOPY: SHX5120

## 2017-11-26 LAB — GLUCOSE, CAPILLARY
GLUCOSE-CAPILLARY: 254 mg/dL — AB (ref 70–99)
GLUCOSE-CAPILLARY: 248 mg/dL — AB (ref 70–99)
Glucose-Capillary: 269 mg/dL — ABNORMAL HIGH (ref 70–99)
Glucose-Capillary: 226 mg/dL — ABNORMAL HIGH (ref 70–99)
Glucose-Capillary: 207 mg/dL — ABNORMAL HIGH (ref 70–99)
Glucose-Capillary: 219 mg/dL — ABNORMAL HIGH (ref 70–99)
Glucose-Capillary: 248 mg/dL — ABNORMAL HIGH (ref 70–99)

## 2017-11-26 LAB — PREGNANCY, URINE: PREG TEST UR: NEGATIVE

## 2017-11-26 SURGERY — HYSTERECTOMY, TOTAL, LAPAROSCOPIC, WITH SALPINGECTOMY
Anesthesia: General

## 2017-11-26 MED ORDER — ROCURONIUM BROMIDE 100 MG/10ML IV SOLN
INTRAVENOUS | Status: AC
  Filled 2017-11-26: qty 1

## 2017-11-26 MED ORDER — PROPOFOL 10 MG/ML IV BOLUS
INTRAVENOUS | Status: AC
  Filled 2017-11-26: qty 20

## 2017-11-26 MED ORDER — LIDOCAINE HCL 1 % IJ SOLN
INTRAMUSCULAR | Status: AC
  Filled 2017-11-26: qty 20

## 2017-11-26 MED ORDER — HYDROMORPHONE HCL 1 MG/ML IJ SOLN
INTRAMUSCULAR | Status: AC
  Filled 2017-11-26: qty 1

## 2017-11-26 MED ORDER — LACTATED RINGERS IV SOLN
INTRAVENOUS | Status: DC
  Administered 2017-11-26: 125 mL/h via INTRAVENOUS

## 2017-11-26 MED ORDER — INSULIN ASPART 100 UNIT/ML ~~LOC~~ SOLN
0.0000 [IU] | Freq: Three times a day (TID) | SUBCUTANEOUS | Status: DC
  Administered 2017-11-26 – 2017-11-27 (×2): 5 [IU] via SUBCUTANEOUS

## 2017-11-26 MED ORDER — INSULIN GLARGINE 100 UNIT/ML ~~LOC~~ SOLN
60.0000 [IU] | Freq: Every day | SUBCUTANEOUS | Status: DC
  Administered 2017-11-26: 60 [IU] via SUBCUTANEOUS
  Filled 2017-11-26 (×2): qty 0.6

## 2017-11-26 MED ORDER — SUGAMMADEX SODIUM 200 MG/2ML IV SOLN
INTRAVENOUS | Status: AC
  Filled 2017-11-26: qty 2

## 2017-11-26 MED ORDER — INSULIN REGULAR HUMAN 100 UNIT/ML IJ SOLN
2.0000 [IU] | Freq: Once | INTRAMUSCULAR | Status: AC
  Administered 2017-11-26: 2 [IU] via SUBCUTANEOUS
  Filled 2017-11-26: qty 10

## 2017-11-26 MED ORDER — FENTANYL CITRATE (PF) 250 MCG/5ML IJ SOLN
INTRAMUSCULAR | Status: AC
  Filled 2017-11-26: qty 5

## 2017-11-26 MED ORDER — ONDANSETRON HCL 4 MG/2ML IJ SOLN
4.0000 mg | Freq: Four times a day (QID) | INTRAMUSCULAR | Status: DC | PRN
  Administered 2017-11-26: 4 mg via INTRAVENOUS
  Filled 2017-11-26: qty 2

## 2017-11-26 MED ORDER — METRONIDAZOLE IN NACL 5-0.79 MG/ML-% IV SOLN
500.0000 mg | Freq: Two times a day (BID) | INTRAVENOUS | Status: DC
  Administered 2017-11-26 – 2017-11-27 (×2): 500 mg via INTRAVENOUS
  Filled 2017-11-26 (×3): qty 100

## 2017-11-26 MED ORDER — PANTOPRAZOLE SODIUM 40 MG PO TBEC
40.0000 mg | DELAYED_RELEASE_TABLET | Freq: Every day | ORAL | Status: DC
  Filled 2017-11-26: qty 1

## 2017-11-26 MED ORDER — FLUCONAZOLE IN SODIUM CHLORIDE 200-0.9 MG/100ML-% IV SOLN
200.0000 mg | INTRAVENOUS | Status: DC
  Administered 2017-11-26: 200 mg via INTRAVENOUS
  Filled 2017-11-26 (×2): qty 100

## 2017-11-26 MED ORDER — SCOPOLAMINE 1 MG/3DAYS TD PT72
MEDICATED_PATCH | TRANSDERMAL | Status: AC
  Administered 2017-11-26: 1.5 mg via TRANSDERMAL
  Filled 2017-11-26: qty 1

## 2017-11-26 MED ORDER — METFORMIN HCL ER 500 MG PO TB24
500.0000 mg | ORAL_TABLET | Freq: Two times a day (BID) | ORAL | Status: DC
  Administered 2017-11-26 – 2017-11-27 (×2): 500 mg via ORAL
  Filled 2017-11-26 (×2): qty 1

## 2017-11-26 MED ORDER — ONDANSETRON HCL 4 MG/2ML IJ SOLN
INTRAMUSCULAR | Status: DC | PRN
  Administered 2017-11-26: 4 mg via INTRAVENOUS

## 2017-11-26 MED ORDER — HYDROMORPHONE HCL 1 MG/ML IJ SOLN
1.0000 mg | INTRAMUSCULAR | Status: DC | PRN
  Administered 2017-11-26: 1 mg via INTRAVENOUS
  Filled 2017-11-26: qty 1

## 2017-11-26 MED ORDER — ALUM & MAG HYDROXIDE-SIMETH 200-200-20 MG/5ML PO SUSP: 30.0000 mL | ORAL | Status: DC | PRN

## 2017-11-26 MED ORDER — FLUORESCEIN SODIUM 10 % IV SOLN
INTRAVENOUS | Status: DC | PRN
  Administered 2017-11-26: 100 mg via INTRAVENOUS

## 2017-11-26 MED ORDER — PNEUMOCOCCAL VAC POLYVALENT 25 MCG/0.5ML IJ INJ
0.5000 mL | INJECTION | INTRAMUSCULAR | Status: AC
  Administered 2017-11-27: 0.5 mL via INTRAMUSCULAR
  Filled 2017-11-26 (×2): qty 0.5

## 2017-11-26 MED ORDER — MIDAZOLAM HCL 2 MG/2ML IJ SOLN
INTRAMUSCULAR | Status: AC
  Filled 2017-11-26: qty 2

## 2017-11-26 MED ORDER — INSULIN REGULAR HUMAN 100 UNIT/ML IJ SOLN: 4.0000 [IU] | Freq: Once | INTRAMUSCULAR | Status: DC

## 2017-11-26 MED ORDER — ONDANSETRON HCL 4 MG PO TABS: 4.0000 mg | ORAL_TABLET | Freq: Four times a day (QID) | ORAL | Status: DC | PRN

## 2017-11-26 MED ORDER — SODIUM CHLORIDE 0.9 % IV SOLN
2.0000 g | INTRAVENOUS | Status: AC
  Administered 2017-11-26: 2 g via INTRAVENOUS

## 2017-11-26 MED ORDER — INSULIN ASPART 100 UNIT/ML ~~LOC~~ SOLN
0.0000 [IU] | Freq: Every day | SUBCUTANEOUS | Status: DC
  Administered 2017-11-26: 2 [IU] via SUBCUTANEOUS

## 2017-11-26 MED ORDER — ACETAMINOPHEN 325 MG PO TABS: 650.0000 mg | ORAL_TABLET | ORAL | Status: DC | PRN

## 2017-11-26 MED ORDER — FLUORESCEIN SODIUM 10 % IV SOLN
INTRAVENOUS | Status: AC
  Filled 2017-11-26: qty 5

## 2017-11-26 MED ORDER — LACTATED RINGERS IV SOLN
INTRAVENOUS | Status: DC
  Administered 2017-11-26 (×2): via INTRAVENOUS

## 2017-11-26 MED ORDER — PROPOFOL 10 MG/ML IV BOLUS
INTRAVENOUS | Status: DC | PRN
  Administered 2017-11-26: 110 mg via INTRAVENOUS

## 2017-11-26 MED ORDER — SUGAMMADEX SODIUM 200 MG/2ML IV SOLN
INTRAVENOUS | Status: DC | PRN
  Administered 2017-11-26: 200 mg via INTRAVENOUS

## 2017-11-26 MED ORDER — FENTANYL CITRATE (PF) 100 MCG/2ML IJ SOLN
INTRAMUSCULAR | Status: DC | PRN
  Administered 2017-11-26: 100 ug via INTRAVENOUS
  Administered 2017-11-26: 50 ug via INTRAVENOUS
  Administered 2017-11-26: 100 ug via INTRAVENOUS

## 2017-11-26 MED ORDER — FENTANYL CITRATE (PF) 100 MCG/2ML IJ SOLN
INTRAMUSCULAR | Status: AC
  Filled 2017-11-26: qty 2

## 2017-11-26 MED ORDER — SCOPOLAMINE 1 MG/3DAYS TD PT72
1.0000 | MEDICATED_PATCH | Freq: Once | TRANSDERMAL | Status: DC
  Administered 2017-11-26: 1.5 mg via TRANSDERMAL

## 2017-11-26 MED ORDER — ROCURONIUM BROMIDE 100 MG/10ML IV SOLN
INTRAVENOUS | Status: DC | PRN
  Administered 2017-11-26 (×2): 20 mg via INTRAVENOUS
  Administered 2017-11-26: 50 mg via INTRAVENOUS

## 2017-11-26 MED ORDER — FENTANYL CITRATE (PF) 100 MCG/2ML IJ SOLN
25.0000 ug | INTRAMUSCULAR | Status: DC | PRN
  Administered 2017-11-26 (×3): 50 ug via INTRAVENOUS

## 2017-11-26 MED ORDER — BUPIVACAINE HCL (PF) 0.25 % IJ SOLN
INTRAMUSCULAR | Status: DC | PRN
  Administered 2017-11-26: 19 mL

## 2017-11-26 MED ORDER — OXYCODONE-ACETAMINOPHEN 5-325 MG PO TABS
2.0000 | ORAL_TABLET | ORAL | Status: DC | PRN
  Administered 2017-11-26 – 2017-11-27 (×3): 2 via ORAL
  Filled 2017-11-26 (×3): qty 2

## 2017-11-26 MED ORDER — HYDROMORPHONE HCL 1 MG/ML IJ SOLN
INTRAMUSCULAR | Status: DC | PRN
  Administered 2017-11-26: 1 mg via INTRAVENOUS

## 2017-11-26 MED ORDER — FENTANYL CITRATE (PF) 100 MCG/2ML IJ SOLN
INTRAMUSCULAR | Status: AC
  Administered 2017-11-26: 50 ug via INTRAVENOUS
  Filled 2017-11-26: qty 2

## 2017-11-26 MED ORDER — MIDAZOLAM HCL 2 MG/2ML IJ SOLN
INTRAMUSCULAR | Status: DC | PRN
  Administered 2017-11-26: 2 mg via INTRAVENOUS

## 2017-11-26 MED ORDER — DOCUSATE SODIUM 100 MG PO CAPS
100.0000 mg | ORAL_CAPSULE | Freq: Two times a day (BID) | ORAL | Status: DC
  Administered 2017-11-26 – 2017-11-27 (×2): 100 mg via ORAL
  Filled 2017-11-26 (×2): qty 1

## 2017-11-26 MED ORDER — ONDANSETRON HCL 4 MG/2ML IJ SOLN
INTRAMUSCULAR | Status: AC
  Filled 2017-11-26: qty 2

## 2017-11-26 MED ORDER — SIMETHICONE 80 MG PO CHEW
80.0000 mg | CHEWABLE_TABLET | Freq: Four times a day (QID) | ORAL | Status: DC | PRN
  Administered 2017-11-27: 80 mg via ORAL
  Filled 2017-11-26: qty 1

## 2017-11-26 MED ORDER — PHENYLEPHRINE HCL 10 MG/ML IJ SOLN
INTRAMUSCULAR | Status: DC | PRN
  Administered 2017-11-26: 120 ug via INTRAVENOUS
  Administered 2017-11-26: 40 ug via INTRAVENOUS
  Administered 2017-11-26: 100 ug via INTRAVENOUS

## 2017-11-26 MED ORDER — OXYCODONE-ACETAMINOPHEN 5-325 MG PO TABS
1.0000 | ORAL_TABLET | ORAL | Status: DC | PRN
  Administered 2017-11-26: 1 via ORAL
  Filled 2017-11-26: qty 1

## 2017-11-26 MED ORDER — INSULIN DEGLUDEC 200 UNIT/ML ~~LOC~~ SOPN: 60.0000 [IU] | PEN_INJECTOR | Freq: Every day | SUBCUTANEOUS | Status: DC

## 2017-11-26 MED ORDER — NYSTATIN-TRIAMCINOLONE 100000-0.1 UNIT/GM-% EX OINT
TOPICAL_OINTMENT | Freq: Two times a day (BID) | CUTANEOUS | Status: DC
  Administered 2017-11-26 – 2017-11-27 (×3): via TOPICAL
  Filled 2017-11-26: qty 15

## 2017-11-26 MED ORDER — LIDOCAINE HCL (CARDIAC) PF 100 MG/5ML IV SOSY
PREFILLED_SYRINGE | INTRAVENOUS | Status: DC | PRN
  Administered 2017-11-26: 100 mg via INTRAVENOUS

## 2017-11-26 MED ORDER — SODIUM CHLORIDE 0.9 % IV SOLN
INTRAVENOUS | Status: AC
  Filled 2017-11-26: qty 2

## 2017-11-26 SURGICAL SUPPLY — 54 items
APPLICATOR ARISTA FLEXITIP XL (MISCELLANEOUS) ×4 IMPLANT
BARRIER ADHS 3X4 INTERCEED (GAUZE/BANDAGES/DRESSINGS) IMPLANT
CABLE HIGH FREQUENCY MONO STRZ (ELECTRODE) IMPLANT
COVER BACK TABLE 60X90IN (DRAPES) IMPLANT
COVER LIGHT HANDLE  1/PK (MISCELLANEOUS)
COVER LIGHT HANDLE 1/PK (MISCELLANEOUS) IMPLANT
COVER MAYO STAND STRL (DRAPES) ×4 IMPLANT
DERMABOND ADHESIVE PROPEN (GAUZE/BANDAGES/DRESSINGS) ×2
DERMABOND ADVANCED (GAUZE/BANDAGES/DRESSINGS)
DERMABOND ADVANCED .7 DNX12 (GAUZE/BANDAGES/DRESSINGS) IMPLANT
DERMABOND ADVANCED .7 DNX6 (GAUZE/BANDAGES/DRESSINGS) ×2 IMPLANT
DEVICE SUTURE ENDOST 10MM (ENDOMECHANICALS) ×4 IMPLANT
DURAPREP 26ML APPLICATOR (WOUND CARE) ×4 IMPLANT
FILTER SMOKE EVAC LAPAROSHD (FILTER) ×4 IMPLANT
GAUZE VASELINE 3X9 (GAUZE/BANDAGES/DRESSINGS) ×4 IMPLANT
GLOVE BIO SURGEON STRL SZ 6.5 (GLOVE) ×6 IMPLANT
GLOVE BIO SURGEONS STRL SZ 6.5 (GLOVE) ×2
GLOVE BIOGEL PI IND STRL 7.0 (GLOVE) ×6 IMPLANT
GLOVE BIOGEL PI INDICATOR 7.0 (GLOVE) ×6
GOWN STRL REUS W/TWL LRG LVL3 (GOWN DISPOSABLE) ×16 IMPLANT
HEMOSTAT ARISTA ABSORB 3G PWDR (MISCELLANEOUS) ×4 IMPLANT
MANIPULATOR UTERINE 7CM CLEARV (MISCELLANEOUS) IMPLANT
NS IRRIG 1000ML POUR BTL (IV SOLUTION) ×4 IMPLANT
OCCLUDER COLPOPNEUMO (BALLOONS) ×4 IMPLANT
PACK LAVH (CUSTOM PROCEDURE TRAY) ×4 IMPLANT
PACK TRENDGUARD 450 HYBRID PRO (MISCELLANEOUS) ×2 IMPLANT
PROTECTOR NERVE ULNAR (MISCELLANEOUS) ×8 IMPLANT
SCISSORS LAP 5X35 DISP (ENDOMECHANICALS) IMPLANT
SET CYSTO W/LG BORE CLAMP LF (SET/KITS/TRAYS/PACK) ×4 IMPLANT
SET IRRIG TUBING LAPAROSCOPIC (IRRIGATION / IRRIGATOR) ×4 IMPLANT
SET TRI-LUMEN FLTR TB AIRSEAL (TUBING) IMPLANT
SHEARS HARMONIC ACE PLUS 36CM (ENDOMECHANICALS) ×4 IMPLANT
SLEEVE XCEL OPT CAN 5 100 (ENDOMECHANICALS) IMPLANT
SUT ENDO VLOC 180-0-8IN (SUTURE) ×4 IMPLANT
SUT VIC AB 0 CT1 27 (SUTURE) ×4
SUT VIC AB 0 CT1 27XBRD ANBCTR (SUTURE) ×4 IMPLANT
SUT VICRYL 0 UR6 27IN ABS (SUTURE) ×4 IMPLANT
SUT VICRYL 4-0 PS2 18IN ABS (SUTURE) ×4 IMPLANT
SYR 10ML LL (SYRINGE) ×4 IMPLANT
SYR 50ML LL SCALE MARK (SYRINGE) ×4 IMPLANT
SYSTEM CARTER THOMASON II (TROCAR) ×4 IMPLANT
TIP UTERINE 5.1X6CM LAV DISP (MISCELLANEOUS) IMPLANT
TIP UTERINE 6.7X10CM GRN DISP (MISCELLANEOUS) IMPLANT
TIP UTERINE 6.7X6CM WHT DISP (MISCELLANEOUS) IMPLANT
TIP UTERINE 6.7X8CM BLUE DISP (MISCELLANEOUS) ×4 IMPLANT
TOWEL OR 17X24 6PK STRL BLUE (TOWEL DISPOSABLE) ×8 IMPLANT
TRAY FOLEY W/BAG SLVR 14FR (SET/KITS/TRAYS/PACK) ×4 IMPLANT
TRENDGUARD 450 HYBRID PRO PACK (MISCELLANEOUS) ×4
TROCAR PORT AIRSEAL 5X120 (TROCAR) IMPLANT
TROCAR PORT AIRSEAL 8X120 (TROCAR) IMPLANT
TROCAR XCEL NON-BLD 11X100MML (ENDOMECHANICALS) ×4 IMPLANT
TROCAR XCEL NON-BLD 5MMX100MML (ENDOMECHANICALS) ×4 IMPLANT
TUBING INSUF HEATED (TUBING) ×4 IMPLANT
WARMER LAPAROSCOPE (MISCELLANEOUS) ×4 IMPLANT

## 2017-11-26 NOTE — Progress Notes (Addendum)
Day of Surgery Procedure(s) (LRB): TOTAL LAPAROSCOPIC HYSTERECTOMY WITH BILATERAL SALPINGECTOMY LEFT OOPHERECTOMY (Bilateral) CYSTOSCOPY (N/A)  Subjective: Patient reports nausea, incisional pain and tolerating PO.  routine PostOp care.  Thanks diabetic educator for input.  Will start insulin coverage  Objective: I have reviewed patient's vital signs, intake and output, medications and labs. Sugars elevated >200  General: alert and no distress Resp: clear to auscultation bilaterally Cardio: regular rate and rhythm GI: soft, non-tender; bowel sounds normal; no masses,  no organomegaly Extremities: extremities normal, atraumatic, no cyanosis or edema   Inc C/D/I  Assessment: s/p Procedure(s): TOTAL LAPAROSCOPIC HYSTERECTOMY WITH BILATERAL SALPINGECTOMY LEFT OOPHERECTOMY (Bilateral) CYSTOSCOPY (N/A): stable and progressing well  Plan: Advance diet Encourage ambulation Advance to PO medication routine postop care.    LOS: 1 day    Luisangel Wainright Bovard-Stuckert 11/26/2017, 6:08 PM

## 2017-11-26 NOTE — Progress Notes (Signed)
Inpatient Diabetes Program Recommendations  AACE/ADA: New Consensus Statement on Inpatient Glycemic Control (2015)  Target Ranges:  Prepandial:   less than 140 mg/dL      Peak postprandial:   less than 180 mg/dL (1-2 hours)      Critically ill patients:  140 - 180 mg/dL   Lab Results  Component Value Date   GLUCAP 226 (H) 11/26/2017   HGBA1C 10.5 (H) 01/03/2016    Review of Glycemic Control Results for KIMBREE, CASANAS (MRN [id number]) as of 11/26/2017 14:58  Ref. Range 11/26/2017 07:37 11/26/2017 09:42  Glucose-Capillary Latest Ref Range: 70 - 99 mg/dL 269 (H) 226 (H)   Diabetes history: Type 2 DM Outpatient Diabetes medications: Tresiba 60 units QD, Metformin 500 mg BID Current orders for Inpatient glycemic control: Lantus 60 units QD, Metformin 500 mg BID  Inpatient Diabetes Program Recommendations:    Of note, last A1C was back in 12/2016 and was 7.9%. At last PCP appointment, A1C was waived. May want to consider repeating at next PCP visit.   Additionally, consider adding Novolog 0-9 units TID and Novolog 0-5 units QHS under the Glycemic control order set and collecting CBGs TID + HS.   Thanks, Bronson Curb, MSN, RNC-OB Diabetes Coordinator [phone] (8a-5p)

## 2017-11-26 NOTE — Progress Notes (Signed)
Diabetic coordinator paged.

## 2017-11-26 NOTE — Transfer of Care (Signed)
Immediate Anesthesia Transfer of Care Note  Patient: Anna Salinas  Procedure(s) Performed: TOTAL LAPAROSCOPIC HYSTERECTOMY WITH BILATERAL SALPINGECTOMY LEFT OOPHERECTOMY (Bilateral ) CYSTOSCOPY (N/A )  Patient Location: PACU  Anesthesia Type:General  Level of Consciousness: awake, alert  and oriented  Airway & Oxygen Therapy: Patient Spontanous Breathing and Patient connected to nasal cannula oxygen  Post-op Assessment: Report given to RN and Post -op Vital signs reviewed and stable  Post vital signs: Reviewed and stable  Last Vitals:  Vitals Value Taken Time  BP 137/75 11/26/2017 11:25 AM  Temp    Pulse 102 11/26/2017 11:28 AM  Resp 15 11/26/2017 11:28 AM  SpO2 100 % 11/26/2017 11:28 AM  Vitals shown include unvalidated device data.  Last Pain:  Vitals:   11/26/17 0723  TempSrc: Oral  PainSc: 0-No pain      Patients Stated Pain Goal: 6 (81/15/72 6203)  Complications: No apparent anesthesia complications

## 2017-11-26 NOTE — Anesthesia Procedure Notes (Signed)
Procedure Name: Intubation Date/Time: 11/26/2017 8:39 AM Performed by: Jonna Munro, CRNA Pre-anesthesia Checklist: Patient identified, Suction available, Emergency Drugs available, Patient being monitored and Timeout performed Patient Re-evaluated:Patient Re-evaluated prior to induction Oxygen Delivery Method: Circle system utilized Preoxygenation: Pre-oxygenation with 100% oxygen Induction Type: IV induction Ventilation: Mask ventilation without difficulty Laryngoscope Size: Mac and 3 Grade View: Grade I Tube type: Oral Tube size: 7.0 mm Number of attempts: 1 Airway Equipment and Method: Stylet Placement Confirmation: ETT inserted through vocal cords under direct vision,  positive ETCO2 and breath sounds checked- equal and bilateral Secured at: 21 cm Tube secured with: Tape Dental Injury: Teeth and Oropharynx as per pre-operative assessment

## 2017-11-26 NOTE — Interval H&P Note (Signed)
History and Physical Interval Note:  11/26/2017 8:20 AM  Anna Salinas  has presented today for surgery, with the diagnosis of dysfunctional uterine bleeding  The various methods of treatment have been discussed with the patient and family. After consideration of risks, benefits and other options for treatment, the patient has consented to  Procedure(s): TOTAL LAPAROSCOPIC HYSTERECTOMY WITH SALPINGECTOMY (Bilateral) CYSTOSCOPY (N/A) as a surgical intervention .  The patient's history has been reviewed, patient examined, no change in status, stable for surgery.  I have reviewed the patient's chart and labs.  Questions were answered to the patient's satisfaction.     Isaiah Serge

## 2017-11-26 NOTE — Anesthesia Postprocedure Evaluation (Signed)
Anesthesia Post Note  Patient: Anna Salinas  Procedure(s) Performed: TOTAL LAPAROSCOPIC HYSTERECTOMY WITH BILATERAL SALPINGECTOMY LEFT OOPHERECTOMY (Bilateral ) CYSTOSCOPY (N/A )     Patient location during evaluation: PACU Anesthesia Type: General Level of consciousness: awake and alert Pain management: pain level controlled Vital Signs Assessment: post-procedure vital signs reviewed and stable Respiratory status: spontaneous breathing, nonlabored ventilation, respiratory function stable and patient connected to nasal cannula oxygen Cardiovascular status: blood pressure returned to baseline and stable Postop Assessment: no apparent nausea or vomiting Anesthetic complications: no    Last Vitals:  Vitals:   11/26/17 1503 11/26/17 2012  BP: 130/71 137/83  Pulse: 83 90  Resp: 18 16  Temp: 36.9 C 36.9 C  SpO2: 98% 96%    Last Pain:  Vitals:   11/26/17 2012  TempSrc: Oral  PainSc:    Pain Goal: Patients Stated Pain Goal: 3 (11/26/17 1840)               [place]

## 2017-11-26 NOTE — Brief Op Note (Signed)
11/26/2017  11:28 AM  PATIENT:  Anna Salinas  47 y.o. female  PRE-OPERATIVE DIAGNOSIS:  dysfunctional uterine bleeding  POST-OPERATIVE DIAGNOSIS:  dysfunctional uterine bleeding, intra-procedural bleeding  PROCEDURE:  Procedure(s): TOTAL LAPAROSCOPIC HYSTERECTOMY WITH BILATERAL SALPINGECTOMY LEFT OOPHERECTOMY (Bilateral) CYSTOSCOPY (N/A)  SURGEON:  Surgeon(s) and Role:    * Salome Hautala, Bonnee Quin, DO - Primary    * Meisinger, Todd, MD - Assisting  PHYSICIAN ASSISTANT:   ASSISTANTS: Meisinger, Todd MD   ANESTHESIA:   general  EBL:  800 mL   BLOOD ADMINISTERED:none  DRAINS: Urinary Catheter (Foley)   LOCAL MEDICATIONS USED:  MARCAINE     SPECIMEN:  Source of Specimen:  uterus, bilateral fallopian tubes, left ovary  DISPOSITION OF SPECIMEN:  PATHOLOGY  COUNTS:  YES  TOURNIQUET:  * No tourniquets in log *  DICTATION: .Note written in EPIC  PLAN OF CARE: Admit for overnight observation  PATIENT DISPOSITION:  PACU - hemodynamically stable.   Delay start of Pharmacological VTE agent (>24hrs) due to surgical blood loss or risk of bleeding: yes

## 2017-11-26 NOTE — Anesthesia Preprocedure Evaluation (Addendum)
Anesthesia Evaluation  Patient identified by MRN, date of birth, ID band Patient awake    Reviewed: Allergy & Precautions, NPO status , Patient's Chart, lab work & pertinent test results  Airway Mallampati: I  TM Distance: >3 FB Neck ROM: Full    Dental no notable dental hx. (+) Poor Dentition,    Pulmonary neg pulmonary ROS,    Pulmonary exam normal breath sounds clear to auscultation       Cardiovascular hypertension, Normal cardiovascular exam+ Valvular Problems/Murmurs AS  Rhythm:Regular Rate:Normal  - Left ventricle: The cavity size was normal. Systolic function was normal. The estimated ejection fraction was in the range of 60% to 65%. Wall motion was normal; there were no regional wall motion abnormalities. Left ventricular diastolic function parameters were normal. - Aortic valve: There was very mild stenosis. - Right ventricle: The cavity size was normal. Wall thickness was normal. Systolic function was normal. - Right atrium: The atrium was normal in size. - Tricuspid valve: Structurally normal valve. There was mild regurgitation. - Pulmonary arteries: The main pulmonary artery was normal-sized.   Systolic pressure was within the normal range. - Inferior vena cava: The vessel was normal in size. - Pericardium, extracardiac: There was no pericardial effusion.  Impressions:  - S/P AVR with a mechanical valve. Leaflets are not well   visualized. Transaortic gradients are slightly higher than on a prior study from 04/15/2014 now peak/mean 38/19 mmHg, peak velocity 3.1 m/s, but still within upper normal range for this type of   prosthesis.   Neuro/Psych negative neurological ROS  negative psych ROS   GI/Hepatic negative GI ROS, Neg liver ROS,   Endo/Other  negative endocrine ROSdiabetes, Type 2, Insulin Dependent  Renal/GU negative Renal ROS  negative genitourinary   Musculoskeletal negative musculoskeletal ROS (+)    Abdominal   Peds negative pediatric ROS (+)  Hematology  (+) Blood dyscrasia, anemia ,   Anesthesia Other Findings Dysfunctional uterine bleeding  On coumadin, last dose 11/8  Reproductive/Obstetrics negative OB ROS                            Anesthesia Physical Anesthesia Plan  ASA: III  Anesthesia Plan: General   Post-op Pain Management:    Induction: Intravenous  PONV Risk Score and Plan: 3 and Ondansetron, Dexamethasone and Midazolam  Airway Management Planned: Oral ETT  Additional Equipment:   Intra-op Plan:   Post-operative Plan: Extubation in OR  Informed Consent: I have reviewed the patients History and Physical, chart, labs and discussed the procedure including the risks, benefits and alternatives for the proposed anesthesia with the patient or authorized representative who has indicated his/her understanding and acceptance.   Dental advisory given  Plan Discussed with: CRNA  Anesthesia Plan Comments:         Anesthesia Quick Evaluation

## 2017-11-27 ENCOUNTER — Encounter (HOSPITAL_COMMUNITY): Payer: Self-pay | Admitting: Obstetrics and Gynecology

## 2017-11-27 DIAGNOSIS — E1165 Type 2 diabetes mellitus with hyperglycemia: Secondary | ICD-10-CM | POA: Diagnosis not present

## 2017-11-27 DIAGNOSIS — N840 Polyp of corpus uteri: Secondary | ICD-10-CM | POA: Diagnosis not present

## 2017-11-27 DIAGNOSIS — Z23 Encounter for immunization: Secondary | ICD-10-CM | POA: Diagnosis not present

## 2017-11-27 DIAGNOSIS — Z952 Presence of prosthetic heart valve: Secondary | ICD-10-CM | POA: Diagnosis not present

## 2017-11-27 DIAGNOSIS — Z794 Long term (current) use of insulin: Secondary | ICD-10-CM | POA: Diagnosis not present

## 2017-11-27 DIAGNOSIS — N838 Other noninflammatory disorders of ovary, fallopian tube and broad ligament: Secondary | ICD-10-CM | POA: Diagnosis not present

## 2017-11-27 DIAGNOSIS — I10 Essential (primary) hypertension: Secondary | ICD-10-CM | POA: Diagnosis not present

## 2017-11-27 DIAGNOSIS — N938 Other specified abnormal uterine and vaginal bleeding: Secondary | ICD-10-CM | POA: Diagnosis not present

## 2017-11-27 DIAGNOSIS — D649 Anemia, unspecified: Secondary | ICD-10-CM | POA: Diagnosis not present

## 2017-11-27 DIAGNOSIS — D259 Leiomyoma of uterus, unspecified: Secondary | ICD-10-CM | POA: Diagnosis not present

## 2017-11-27 LAB — COMPREHENSIVE METABOLIC PANEL WITH GFR
ALT: 17 U/L (ref 0–44)
AST: 17 U/L (ref 15–41)
Albumin: 3.1 g/dL — ABNORMAL LOW (ref 3.5–5.0)
Alkaline Phosphatase: 34 U/L — ABNORMAL LOW (ref 38–126)
Anion gap: 7 (ref 5–15)
BUN: 8 mg/dL (ref 6–20)
CO2: 24 mmol/L (ref 22–32)
Calcium: 7.8 mg/dL — ABNORMAL LOW (ref 8.9–10.3)
Chloride: 101 mmol/L (ref 98–111)
Creatinine, Ser: 0.66 mg/dL (ref 0.44–1.00)
GFR calc Af Amer: >60 mL/min (ref >60)
GFR calc non Af Amer: >60 mL/min (ref >60)
Glucose, Bld: 231 mg/dL — ABNORMAL HIGH (ref 70–99)
Potassium: 3.3 mmol/L — ABNORMAL LOW (ref 3.5–5.1)
Sodium: 132 mmol/L — ABNORMAL LOW (ref 135–145)
Total Bilirubin: 1.3 mg/dL — ABNORMAL HIGH (ref 0.3–1.2)
Total Protein: 6 g/dL — ABNORMAL LOW (ref 6.5–8.1)

## 2017-11-27 LAB — CBC
HCT: 33.4 % — ABNORMAL LOW (ref 36.0–46.0)
Hemoglobin: 11.1 g/dL — ABNORMAL LOW (ref 12.0–15.0)
MCH: 29.4 pg (ref 26.0–34.0)
MCHC: 33.2 g/dL (ref 30.0–36.0)
MCV: 88.6 fL (ref 80.0–100.0)
PLATELETS: 138 10*3/uL — AB (ref 150–400)
RBC: 3.77 MIL/uL — ABNORMAL LOW (ref 3.87–5.11)
RDW: 14.3 % (ref 11.5–15.5)
WBC: 11.7 10*3/uL — ABNORMAL HIGH (ref 4.0–10.5)
nRBC: 0 % (ref 0.0–0.2)

## 2017-11-27 LAB — GLUCOSE, CAPILLARY: Glucose-Capillary: 238 mg/dL — ABNORMAL HIGH (ref 70–99)

## 2017-11-27 LAB — HEMOGLOBIN A1C
HEMOGLOBIN A1C: 9.9 % — AB (ref 4.8–5.6)
MEAN PLASMA GLUCOSE: 237.43 mg/dL

## 2017-11-27 MED ORDER — NYSTATIN-TRIAMCINOLONE 100000-0.1 UNIT/GM-% EX OINT: TOPICAL_OINTMENT | Freq: Two times a day (BID) | CUTANEOUS | 0 refills | Status: DC

## 2017-11-27 MED ORDER — FLUCONAZOLE 150 MG PO TABS: 150.0000 mg | ORAL_TABLET | ORAL | 0 refills | Status: DC

## 2017-11-27 MED ORDER — LACTATED RINGERS IV SOLN
INTRAVENOUS | Status: DC
  Administered 2017-11-27: 01:00:00 via INTRAVENOUS

## 2017-11-27 MED ORDER — METRONIDAZOLE 500 MG PO TABS: 500.0000 mg | ORAL_TABLET | Freq: Two times a day (BID) | ORAL | 0 refills | Status: DC

## 2017-11-27 MED ORDER — OXYCODONE-ACETAMINOPHEN 5-325 MG PO TABS: 1.0000 | ORAL_TABLET | ORAL | 0 refills | Status: AC | PRN

## 2017-11-27 MED ORDER — ACETAMINOPHEN 325 MG PO TABS: 650.0000 mg | ORAL_TABLET | ORAL | 2 refills | Status: DC | PRN

## 2017-11-27 NOTE — Progress Notes (Addendum)
1 Day Post-Op Procedure(s) (LRB): TOTAL LAPAROSCOPIC HYSTERECTOMY WITH BILATERAL SALPINGECTOMY LEFT OOPHERECTOMY (Bilateral) CYSTOSCOPY (N/A)  Subjective: Patient reports incisional pain, tolerating PO and + flatus.  Foley catheter still in place. No fever, chills, dizziness, CP or SOB. Feels ready for discharge to home today  Objective: I have reviewed patient's vital signs, intake and output, medications and labs.  General: alert, cooperative, appears stated age and no distress GI: soft, non-tender; bowel sounds normal; no masses,  no organomegaly and incision: clean, dry and intact Extremities: extremities normal, atraumatic, no cyanosis or edema ; scds in place Assessment: s/p Procedure(s): TOTAL LAPAROSCOPIC HYSTERECTOMY WITH BILATERAL SALPINGECTOMY LEFT OOPHERECTOMY (Bilateral) CYSTOSCOPY (N/A): stable  Plan: Discontinue foley catheter Encourage ambulation Continue on diflucan and nystatin ointment; also flagyl  Appt made with FNP at PCP for 11am 11/15 for glucose management Resume warfarin tonight Discharge to home once able to void post foley removal Follow up in office for post op visit in 2 weeks    LOS: 1 day    [place] 11/27/2017, 8:39 AM

## 2017-11-27 NOTE — Op Note (Signed)
Operative Note    Preoperative Diagnosis: Dysfunctional uterine bleeding   Postoperative Diagnosis: Same                                               Intraprocedural bleeding    Procedure: Total laparoscopic hysterectomy with bilateral salpingectomy and left oopherectomy, cystoscopy   Surgeon: Mickle Mallory DO Assist: Meisinger T, MD  Anesthesia: General  Fluids: LR 2L EBL: 824ml UOP: 360ml   Findings Enlarged uterus with small fibroid, grossly normal previously falloped tubes, normal right ovary. Left ovary with bleeding - excised   Specimen: Uterus, cervix, bilateral fallopian tube remnants,   Procedure Note Pt seen in pre-op. Procedure reviewed and all questions answered; consent verified  Pt taken to operating room and placed in dorsal lithotomy position with her arms safely positioned at her sides. General anesthesia was administered and found to be adequate. Pt was prepped and draped in sterile fashion and a timeout performed. A weighted speculum was placed in the posterior fornix and a sim retractor placed anteriorly. Excellent visualization of the cervix was obtained. Uterus was sounded to 10cm so a size 8 koh was assembled with a small cup and placed with retention sutures at 3 and 9 o'clock. A foley catheter was also placed in a sterile fashion Legs were then lowered and attention turned to her abdomen.  Here a 108mm incision was made at the umbilicus. A 50mm optiview trocar was then placed with the abdomen tented upwards. The laparoscopic camera was used to confirm placement and pneumoperitoneum obtained with CO2 gas to 25mmHg. The patient was placed in trendelenberg and gross survey of pelvis done.The uterus was noted as described above:  At this time one more 38mm port was then placed under direct visualization in right lower quadrant and an 11 site in the left with care taken to avoid the epigastric vessels. Further exploration noted    Starting on the the patients left,  the left fallopian tube was then grasped with a blunt grasper, elevated and excised using the harmonic hemostatically. A fallope ring from previous BTL was noted. Next the utero-ovarian ligament and the round ligaments were sequentially grasped and excised. The broad ligament was then separated from the uterus as well with a bladder flap created. The cardinal ligament was then excised next at the utero-cervical junction and the ovarian vessel clamped, cauterized and cut. The same was done on the patients right with similar results.  At this time there was brisk bleeding noted from the left broad ligament. This was traced to the ovarian branch leading to the left ovary. Cautery was ineffective in effecting hemostasis thus the left ovary was excised with resolution of bleeding noted.  Next the bladder reflection was dissected away. The vaginal occluder had been  filled with 60cc of saline and starting anteriorly and working laterally and back anteriorly,  the uterus and cervix were amputated off the superior vagina. The uterus, cervix and fallopian tubes were removed.  The pelvis was irrigated and hemostasis noted. The angles of the vaginal cuff were easily seen and using the endostitch with an 0 vicryl v-lock suture, the cuff was closed in a running fashion with 5-6 back stitches to ensure closure.  Further irrigation of the pelvis confirmed no abnormalities or bleeding hence patient was flattened. The 11 port site was closed with 0-vicryl suture with a carter  thomasen to ensure closure of the peritoneum.  The remaining  trocars were removed under direct visualization and gas allowed to escape.  Incision sites were closed with 4-0 vicryl suture and dermabond. Counts were noted to be correct. A cystoscopy was then performed after fluorescein 1cc given. Ureteral jets were noted from both orifices with no injury. There was some moderate debris in the bladder however. Foley was then replaced. Patient was awakened  and taken to recovery room in stable status.   Sponge, lap and needle counts were noted to be correct x 2

## 2017-11-27 NOTE — Progress Notes (Signed)
Spoke with patient on the phone. Patient was diagnosed with diabetes about 4 years ago. Was initially on oral medications, but has been on [place] for about 1 1/2 years. Spoke with her about her HgbA1C of 9.9%. Patient states that about 2 months ago her A1C was 10.8%, but she had gone a month without taking her [name] because she could not get the prescription filled. She has 4 boxes of [place] at home.  She states that she does not check blood sugars regularly. Encouraged her to check consistently at least twice a day and keep record. She will call PCP after discharge. States that she has an appointment in 2 weeks, but will try to get appointment sooner.   Harvel Ricks RN BSN CDE Diabetes Coordinator Pager: [phone]  8am-5pm

## 2017-11-27 NOTE — Plan of Care (Signed)
Pts. Condition will continue to improve 

## 2017-11-27 NOTE — Discharge Summary (Signed)
Physician Discharge Summary  Patient ID: Anna Salinas MRN: [id number] DOB/AGE: 45-Aug-1972 45 y.o.  Admit date: 11/26/2017 Discharge date: 11/27/2017  Admission Diagnoses:  Discharge Diagnoses:  Active Problems:   DUB (dysfunctional uterine bleeding)   S/P laparoscopic hysterectomy   Discharged Condition: stable  Hospital Course: Pt did well with pain control overnight. She was maintained on a sliding insulin scale while in hospital due to uncontrolled blood sugar. Appt made for pt with her PCP tomorrow outpt. She was treated prophylactically for bacterial vaginosis and for obvious vaginal candidiasis. Discharged to home in stable condition  Consults: diabetic educator  Significant Diagnostic Studies: labs: elevated glucose otherwise stable  Treatments: antibiotics: metronidazole, analgesia: acetaminophen and percocet and surgery: Total laparoscopic hysterectomy and left oopherectomy  Discharge Exam: Blood pressure 114/74, pulse (!) 102, temperature 98.5 F (36.9 C), temperature source Oral, resp. rate 18, height 5\' 8"  (1.727 m), weight 104.3 kg, SpO2 99 %. General appearance: alert, cooperative and no distress GI: soft, non-tender; bowel sounds normal; no masses,  no organomegaly and incisions c/d/i Extremities: extremities normal, atraumatic, no cyanosis or edema  Disposition: Discharge disposition: 01-Home or Self Care       Discharge Instructions    Call MD for:  difficulty breathing, headache or visual disturbances   Complete by:  As directed    Call MD for:  extreme fatigue   Complete by:  As directed    Call MD for:  persistant dizziness or light-headedness   Complete by:  As directed    Call MD for:  persistant nausea and vomiting   Complete by:  As directed    Call MD for:  redness, tenderness, or signs of infection (pain, swelling, redness, odor or green/yellow discharge around incision site)   Complete by:  As directed    Call MD for:  severe  uncontrolled pain   Complete by:  As directed    Call MD for:  temperature >100.4   Complete by:  As directed    Diet - low sodium heart healthy   Complete by:  As directed    Discharge instructions   Complete by:  As directed    Nothing in vagina x 6 weeks   Driving Restrictions   Complete by:  As directed    No driving while taking narcotic pain medicine   Increase activity slowly   Complete by:  As directed    Lifting restrictions   Complete by:  As directed    Nothing >15lbs   Sexual Activity Restrictions   Complete by:  As directed    None for 6 weeks     Allergies as of 11/27/2017      Reactions   Glipizide Nausea And Vomiting, Other (See Comments)   GI upset   Metoprolol Other (See Comments)   "Made me sick"   Shellfish Allergy Swelling   Throat and eyes were swollen.  Had difficulty breathing.  Was hospitalized.        Medication List    TAKE these medications   acetaminophen 325 MG tablet Commonly known as:  TYLENOL Take 2 tablets (650 mg total) by mouth every 4 (four) hours as needed for moderate pain or fever.   fluconazole 150 MG tablet Commonly known as:  DIFLUCAN Take 1 tablet (150 mg total) by mouth every 3 (three) days.   glucose blood test strip Use to check BG up to bid.   Insulin Degludec 200 UNIT/ML Sopn Inject 60 Units into the skin daily.  Insulin Pen Needle 32G X 4 MM Misc Use with insulin pen daily as directed   metFORMIN 500 MG 24 hr tablet Commonly known as:  GLUCOPHAGE-XR Take 1 tablet (500 mg total) by mouth 2 (two) times daily.   metroNIDAZOLE 500 MG tablet Commonly known as:  FLAGYL Take 1 tablet (500 mg total) by mouth 2 (two) times daily.   nystatin-triamcinolone ointment Commonly known as:  MYCOLOG Apply topically 2 (two) times daily.   ONETOUCH DELICA LANCETS FINE Misc Use to check BG up to bid   oxyCODONE-acetaminophen 5-325 MG tablet Commonly known as:  PERCOCET/ROXICET Take 1 tablet by mouth every 4 (four)  hours as needed for up to 7 days for severe pain ((when tolerating fluids)).   ranitidine 150 MG tablet Commonly known as:  ZANTAC Take 1 tablet (150 mg total) by mouth daily.   rosuvastatin 10 MG tablet Commonly known as:  CRESTOR Take 1 tablet (10 mg total) by mouth daily.   triamcinolone cream 0.1 % Commonly known as:  KENALOG Apply 1 application topically 2 (two) times daily. What changed:    when to take this  reasons to take this   warfarin 5 MG tablet Commonly known as:  COUMADIN Take as directed. If you are unsure how to take this medication, talk to your nurse or doctor. Original instructions:  1/2-1 tablet per day as rx by pcp What changed:    how much to take  how to take this  when to take this  additional instructions      Follow-up Information    Sherlyn Hay, DO. Call in 2 day(s).   Specialty:  Obstetrics and Gynecology Why:  For post op visit Contact information: [address] [place] [address] [phone]           Signed: Isaiah Serge 11/27/2017, 8:55 AM

## 2017-11-27 NOTE — Discharge Instructions (Signed)
Nothing in vagina for 6 weeks.  No sex, tampons, and douching. No straining.  Other instructions as in [employer].

## 2017-11-27 NOTE — Progress Notes (Signed)
Discharge teaching complete with pt. Pt understood all information and did not have any questions. Pt discharged home to family.

## 2017-11-28 ENCOUNTER — Ambulatory Visit: Payer: BLUE CROSS/BLUE SHIELD | Admitting: Nurse Practitioner

## 2017-12-05 ENCOUNTER — Telehealth: Payer: Self-pay | Admitting: Nurse Practitioner

## 2017-12-05 NOTE — Telephone Encounter (Signed)
PT is wanting to speak to MMM nurse about her warfarin, states that pharmacy called and said one of her meds could interfere with it

## 2017-12-05 NOTE — Telephone Encounter (Signed)
Yes it can so she will just need to have INR in 1 week

## 2017-12-05 NOTE — Telephone Encounter (Signed)
Returned patient's phone call.  Patient states that pharmacy informed patient that Diflucan could interfere with Coumadin.

## 2017-12-09 NOTE — Telephone Encounter (Signed)
Left message for patient to call back to schedule an appt this Week for InR check

## 2017-12-10 NOTE — Progress Notes (Signed)
CARDIOLOGY OFFICE NOTE  Date:  12/10/2017    [name] Date of Birth: [birth year] Medical Record #[id number]  PCP:  Chevis Pretty, FNP  Cardiologist:  Nerissa Constantin Martinique  MD  No chief complaint on file.    History of Present Illness: Anna Salinas is a 47 y.o. female who presents for follow up AVR.   She has a history of AVR in 02/2014 for severe AS with a bicuspid AV. She is on chronic coumadin.  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 These were drained. Repeat CXR and Echo in April 2016 showed resolution of effusions and normal valve function.   In Dec 2017 she was complaining of fatigue with periods of exhaustion. No specific cardiac cause found. Felt to be related to poorly controlled DM with A1c 10.5%. She was started on insulin along with metformin.   She was admitted early this year with severe anemia related to Dysfunctional uterine bleeding. Hgb down to 6.6. Transfused 2 units PRBCs and Hgb improved to 8.8. She did have placement of IUD in April to try and help with the heavy menses. Unable to stop Coumadin due to mechanical AV.  During hospitalization she was noted to have elevated troponin to 0.48. This was felt to be related to demand ischemia secondary to anemia.   On November 13 she underwent laparoscopic total hysterectomy for definitive treatment of her uterine bleeding.   She reports she is doing well. She has recovered nicely from her surgery. Hgb improved to 11.1. Energy level is better. No chest pain or SOB.   Past Medical History:  Diagnosis Date  . Aortic stenosis due to bicuspid aortic valve   . Essential hypertension, benign   . Palpitations   . S/P laparoscopic hysterectomy 11/26/2017  . Type 2 diabetes mellitus ([place])     Past Surgical History:  Procedure Laterality Date  . AORTIC VALVE REPLACEMENT N/A 02/14/2014   Procedure: AORTIC VALVE REPLACEMENT (AVR);  Surgeon: Gaye Pollack, MD;   Location: [facility];  Service: Open Heart Surgery;  Laterality: N/A;  . CARDIAC CATHETERIZATION    . CESAREAN SECTION  2009  . CHEST TUBE INSERTION Right 03/27/2014   Procedure: CHEST TUBE INSERTION;  Surgeon: Grace Isaac, MD;  Location: [facility];  Service: Thoracic;  Laterality: Right;  . CYSTOSCOPY N/A 11/26/2017   Procedure: CYSTOSCOPY;  Surgeon: Sherlyn Hay, DO;  Location: [facility];  Service: Gynecology;  Laterality: N/A;  . INTRAOPERATIVE TRANSESOPHAGEAL ECHOCARDIOGRAM N/A 02/14/2014   Procedure: INTRAOPERATIVE TRANSESOPHAGEAL ECHOCARDIOGRAM;  Surgeon: Gaye Pollack, MD;  Location: [facility];  Service: Open Heart Surgery;  Laterality: N/A;  . LEFT AND RIGHT HEART CATHETERIZATION WITH CORONARY ANGIOGRAM N/A 01/25/2014   Procedure: LEFT AND RIGHT HEART CATHETERIZATION WITH CORONARY ANGIOGRAM;  Surgeon: Oumar Marcott M Martinique, MD;  Location: [facility] C[facility];  Service: Cardiovascular;  Laterality: N/A;  . PERICARDIAL FLUID DRAINAGE N/A 03/27/2014   Procedure: DRAINAGE OF PERICARDIAL FLUID;  Surgeon: Grace Isaac, MD;  Location: [facility];  Service: Thoracic;  Laterality: N/A;  . PLEURAL EFFUSION DRAINAGE Right 03/27/2014   Procedure: DRAINAGE OF PLEURAL EFFUSION;  Surgeon: Grace Isaac, MD;  Location: [facility];  Service: Thoracic;  Laterality: Right;  Drainage of right pleural effusion  . SUBXYPHOID PERICARDIAL WINDOW N/A 03/27/2014   Procedure: SUBXYPHOID PERICARDIAL WINDOW;  Surgeon: Grace Isaac, MD;  Location: [facility];  Service: Thoracic;  Laterality: N/A;  . TOTAL LAPAROSCOPIC HYSTERECTOMY WITH SALPINGECTOMY  Bilateral 11/26/2017   Procedure: TOTAL LAPAROSCOPIC HYSTERECTOMY WITH BILATERAL SALPINGECTOMY LEFT OOPHERECTOMY;  Surgeon: Sherlyn Hay, DO;  Location: [facility];  Service: Gynecology;  Laterality: Bilateral;  . TUBAL LIGATION       Medications: Current Outpatient Medications  Medication Sig Dispense Refill  . acetaminophen (TYLENOL) 325 MG tablet Take 2 tablets (650 mg total) by  mouth every 4 (four) hours as needed for moderate pain or fever. 60 tablet 2  . fluconazole (DIFLUCAN) 150 MG tablet Take 1 tablet (150 mg total) by mouth every 3 (three) days. 5 tablet 0  . glucose blood (ONETOUCH VERIO) test strip Use to check BG up to bid. 100 each 5  . Insulin Degludec (TRESIBA FLEXTOUCH) 200 UNIT/ML SOPN Inject 60 Units into the skin daily. 30 mL 0  . Insulin Pen Needle (DROPLET PEN NEEDLES) 32G X 4 MM MISC Use with insulin pen daily as directed 100 each 2  . metFORMIN (GLUCOPHAGE XR) 500 MG 24 hr tablet Take 1 tablet (500 mg total) by mouth 2 (two) times daily. 180 tablet 0  . metroNIDAZOLE (FLAGYL) 500 MG tablet Take 1 tablet (500 mg total) by mouth 2 (two) times daily. 6 tablet 0  . nystatin-triamcinolone ointment (MYCOLOG) Apply topically 2 (two) times daily. 30 g 0  . ONETOUCH DELICA LANCETS FINE MISC Use to check BG up to bid 100 each 5  . ranitidine (ZANTAC) 150 MG tablet Take 1 tablet (150 mg total) by mouth daily. (Patient not taking: Reported on 11/19/2017) 90 tablet 1  . rosuvastatin (CRESTOR) 10 MG tablet Take 1 tablet (10 mg total) by mouth daily. (Patient not taking: Reported on 11/19/2017) 90 tablet 3  . triamcinolone cream (KENALOG) 0.1 % Apply 1 application topically 2 (two) times daily. (Patient taking differently: Apply 1 application topically 2 (two) times daily as needed (irritation). ) 453.6 g 1  . warfarin (COUMADIN) 5 MG tablet 1/2-1 tablet per day as rx by pcp (Patient taking differently: Take 2.5-5 mg by mouth See admin instructions. Take 2.5 mg by mouth daily except take 5 mg by mouth on Monday, Wednesday and Friday) 90 tablet 2   No current facility-administered medications for this visit.     Allergies: Allergies  Allergen Reactions  . Glipizide Nausea And Vomiting and Other (See Comments)    GI upset  . Metoprolol Other (See Comments)    "Made me sick"  . Shellfish Allergy Swelling    Throat and eyes were swollen.  Had difficulty breathing.   Was hospitalized.      Social History: The patient  reports that she has never smoked. She has never used smokeless tobacco. She reports that she does not drink alcohol or use drugs.   Family History: The patient's family history includes Brain cancer in her brother; Colon cancer in her maternal grandmother; Diabetes in her mother.   Review of Systems: Please see the history of present illness.  Glucose has been elevated.  All other systems are reviewed and negative.   Physical Exam: VS:  There were no vitals taken for this visit. Marland Kitchen  BMI There is no height or weight on file to calculate BMI.  Wt Readings from Last 3 Encounters:  11/26/17 230 lb (104.3 kg)  11/24/17 159 lb 6 oz (72.3 kg)  11/07/17 230 lb (104.3 kg)   GENERAL:  Well appearing obese WF in NAD HEENT:  PERRL, EOMI, sclera are clear. Oropharynx is clear. NECK:  No jugular venous distention, carotid upstroke brisk and  symmetric, no bruits, no thyromegaly or adenopathy LUNGS:  Clear to auscultation bilaterally CHEST:  Unremarkable HEART:  RRR,  PMI not displaced or sustained,good mechanical S2. , no S3, no S4: no clicks, no rubs, no murmurs ABD:  Soft, nontender. BS +, no masses or bruits. No hepatomegaly, no splenomegaly EXT:  2 + pulses throughout, no edema, no cyanosis no clubbing SKIN:  Warm and dry.  No rashes NEURO:  Alert and oriented x 3. Cranial nerves II through XII intact. PSYCH:  Cognitively intact       LABORATORY DATA:  EKG:  EKG is not ordered today.    Lab Results  Component Value Date   WBC 11.7 (H) 11/27/2017   HGB 11.1 (L) 11/27/2017   HCT 33.4 (L) 11/27/2017   PLT 138 (L) 11/27/2017   GLUCOSE 231 (H) 11/27/2017   CHOL 244 (H) 10/16/2017   TRIG 200 (H) 10/16/2017   HDL 20 (L) 10/16/2017   LDLCALC 184 (H) 10/16/2017   ALT 17 11/27/2017   AST 17 11/27/2017   NA 132 (L) 11/27/2017   K 3.3 (L) 11/27/2017   CL 101 11/27/2017   CREATININE 0.66 11/27/2017   BUN 8 11/27/2017   CO2 24  11/27/2017   TSH 0.93 01/03/2016   INR 1.7 (H) 11/07/2017   HGBA1C 9.9 (H) 11/27/2017   MICROALBUR negative 04/14/2014   Last A1c Dec 2018 was 7.9%   BNP (last 3 results) Recent Labs    05/31/17 2159  BNP 84.0    ProBNP (last 3 results) No results for input(s): PROBNP in the last 8760 hours.   Other Studies Reviewed Today:   Echo: 03/26/2014 Study Conclusions - Left ventricle: The cavity size was normal. Wall thickness was normal. Systolic function was vigorous. The estimated ejection fraction was in the range of 65% to 70%. Wall motion was normal; there were no regional wall motion abnormalities. - Aortic valve: A [employer] mechanical prosthesis was present. No obvious perivalvular leak based on limited views. There was no significant regurgitation. Mean gradient (S): 15 mm Hg. Peak gradient (S): 28 mm Hg. - Right ventricle: The cavity size was below normal. - Tricuspid valve: There was trivial regurgitation. - Pulmonary arteries: Systolic pressure could not be accurately estimated. - Pericardium, extracardiac: A large pericardial effusion was identified circumferential to the heart, measures approximately 4 cm posteriorly and 3 cm anteriorly. There is compression of the right ventricle noted in the subcostal view and further evidence of tamponade physiology based on respiratory variation in mitral inflow. There was a right pleural effusion. Impressions: - Normal LV wall thickness with LVEF 65-70%. [employer] mechanical AVR without obvious perivalvular leak based on limited and with grossly normal gradients. A large pericardial effusion was identified circumferential to the heart, measures approximately 4 cm posteriorly and 3-4 cm anteriorly. There is compression of the right ventricle noted in the subcostal view and further evidence of tamponade physiology based on respiratory variation in mitral inflow. Right pleural  effusion also noted. Results discussed with Dr. Servando Snare.  Echo 06/19/17: Study Conclusions  - Left ventricle: The cavity size was normal. Systolic function was   normal. The estimated ejection fraction was in the range of 60%   to 65%. Wall motion was normal; there were no regional wall   motion abnormalities. Left ventricular diastolic function   parameters were normal. - Aortic valve: There was very mild stenosis. - Right ventricle: The cavity size was normal. Wall thickness was   normal. Systolic function  was normal. - Right atrium: The atrium was normal in size. - Tricuspid valve: Structurally normal valve. There was mild   regurgitation. - Pulmonary arteries: The main pulmonary artery was normal-sized.   Systolic pressure was within the normal range. - Inferior vena cava: The vessel was normal in size. - Pericardium, extracardiac: There was no pericardial effusion.  Impressions:  - S/P AVR with a mechanical valve. Leaflets are not well   visualized. Transaortic gradients are slightly higher than on a   prior study from 04/15/2014 now peak/mean 38/19 mmHg, peak velocity   3.1 m/s, but still within upper normal range for this type of   prosthesis.  Assessment/Plan: 1. S/p AVR. continue long term coumadin. Reviewed recommendations for SBE prophylaxis. Valve click is normal today.  2. DM- poorly controlled but improving. Per primary care. On insulin.  3. Chronic coumadin therapy. INR monitored by primary care.   4. Severe anemia secondary to dysfunctional uterine bleeding. Now s/p total hysterectomy.   Disposition:   FU with me in 6 months.  Signed: Beautiful Pensyl Martinique MD, [facility]    12/10/2017 12:57 PM

## 2017-12-15 ENCOUNTER — Ambulatory Visit: Payer: BLUE CROSS/BLUE SHIELD | Admitting: Nurse Practitioner

## 2017-12-15 ENCOUNTER — Encounter: Payer: Self-pay | Admitting: Nurse Practitioner

## 2017-12-15 VITALS — BP 123/79 | HR 79 | Temp 97.2°F | Ht 68.0 in | Wt 229.0 lb

## 2017-12-15 DIAGNOSIS — Z7901 Long term (current) use of anticoagulants: Secondary | ICD-10-CM

## 2017-12-15 LAB — COAGUCHEK XS/INR WAIVED
INR: 2.3 — AB (ref 0.9–1.1)
Prothrombin Time: 28.1 s

## 2017-12-15 NOTE — Addendum Note (Signed)
Addended by: Rolena Infante on: 12/15/2017 01:06 PM   Modules accepted: Orders

## 2017-12-15 NOTE — Progress Notes (Signed)
Subjective:    Patient ID: Anna Salinas, female    DOB: 1970/06/24, 47 y.o.   MRN: [id number]   Chief Complaint: hospital follow up  HPI: patient had hysterectomy 11/26/17 for dysfunctional uterine bleeding. She was gven antibiotics upon discharge that they were afraid would interact with her coumadin. So she is here today for recheck of INR.   Outpatient Encounter Medications as of 12/15/2017  Medication Sig  . acetaminophen (TYLENOL) 325 MG tablet Take 2 tablets (650 mg total) by mouth every 4 (four) hours as needed for moderate pain or fever.  . fluconazole (DIFLUCAN) 150 MG tablet Take 1 tablet (150 mg total) by mouth every 3 (three) days.  Marland Kitchen glucose blood (ONETOUCH VERIO) test strip Use to check BG up to bid.  . Insulin Degludec (TRESIBA FLEXTOUCH) 200 UNIT/ML SOPN Inject 60 Units into the skin daily.  . Insulin Pen Needle (DROPLET PEN NEEDLES) 32G X 4 MM MISC Use with insulin pen daily as directed  . metFORMIN (GLUCOPHAGE XR) 500 MG 24 hr tablet Take 1 tablet (500 mg total) by mouth 2 (two) times daily.  . metroNIDAZOLE (FLAGYL) 500 MG tablet Take 1 tablet (500 mg total) by mouth 2 (two) times daily.  Marland Kitchen nystatin-triamcinolone ointment (MYCOLOG) Apply topically 2 (two) times daily.  Anna Salinas DELICA LANCETS FINE MISC Use to check BG up to bid  . ranitidine (ZANTAC) 150 MG tablet Take 1 tablet (150 mg total) by mouth daily. (Patient not taking: Reported on 11/19/2017)  . rosuvastatin (CRESTOR) 10 MG tablet Take 1 tablet (10 mg total) by mouth daily. (Patient not taking: Reported on 11/19/2017)  . triamcinolone cream (KENALOG) 0.1 % Apply 1 application topically 2 (two) times daily. (Patient taking differently: Apply 1 application topically 2 (two) times daily as needed (irritation). )  . warfarin (COUMADIN) 5 MG tablet 1/2-1 tablet per day as rx by pcp (Patient taking differently: Take 2.5-5 mg by mouth See admin instructions. Take 2.5 mg by mouth daily except take 5 mg by mouth on  Monday, Wednesday and Friday)    Indication: artificail valve Bleeding signs/symptoms: None Thromboembolic signs/symptoms: None  Missed Coumadin doses: stopped coumadin dose  For 3 days prior to surgery Medication changes: has been on antibiotic Dietary changes: no Bacterial/viral infection: no Other concerns: no      New complaints: None today  Social history: Says she is doing well.pain has improved.   Review of Systems  Constitutional: Negative for activity change and appetite change.  HENT: Negative.   Eyes: Negative for pain.  Respiratory: Negative for shortness of breath.   Cardiovascular: Negative for chest pain, palpitations and leg swelling.  Gastrointestinal: Negative for abdominal pain.  Endocrine: Negative for polydipsia.  Genitourinary: Negative.   Skin: Negative for rash.  Neurological: Negative for dizziness, weakness and headaches.  Hematological: Does not bruise/bleed easily.  Psychiatric/Behavioral: Negative.   All other systems reviewed and are negative.      Objective:   Physical Exam  Constitutional: She is oriented to person, place, and time. She appears well-developed and well-nourished. No distress.  HENT:  Head: Normocephalic.  Nose: Nose normal.  Mouth/Throat: Oropharynx is clear and moist.  Eyes: Pupils are equal, round, and reactive to light. EOM are normal.  Neck: Normal range of motion. Neck supple. No JVD present. Carotid bruit is not present.  Cardiovascular: Normal rate, regular rhythm, normal heart sounds and intact distal pulses.  Pulmonary/Chest: Effort normal and breath sounds normal. No respiratory distress. She has no  wheezes. She has no rales. She exhibits no tenderness.  Abdominal: Soft. Normal appearance, normal aorta and bowel sounds are normal. She exhibits no distension, no abdominal bruit, no pulsatile midline mass and no mass. There is no splenomegaly or hepatomegaly. There is tenderness (mild tenderness secondary to  surgery).  Musculoskeletal: Normal range of motion. She exhibits no edema.  Lymphadenopathy:    She has no cervical adenopathy.  Neurological: She is alert and oriented to person, place, and time. She has normal reflexes.  Skin: Skin is warm and dry.  Psychiatric: She has a normal mood and affect. Her behavior is normal. Judgment and thought content normal.  Nursing note and vitals reviewed.   BP 123/79   Pulse 79   Temp (!) 97.2 F (36.2 C) (Oral)   Ht 5\' 8"  (1.727 m)   Wt 229 lb (103.9 kg)   LMP 04/14/2017   BMI 34.82 kg/m   INR 2.3     Assessment & Plan:  HORTENSIA DUFFIN in today with chief complaint of Recheck protime   1. Long term current use of anticoagulant therapy Continue coumadin 5mg  daily except 2.5 m and w. recheck in 4 weeks  Mary-Margaret Hassell Done, FNP

## 2017-12-15 NOTE — Patient Instructions (Signed)
Bleeding Precautions When on Anticoagulant Therapy  WHAT IS ANTICOAGULANT THERAPY?  Anticoagulant therapy is taking medicine to prevent or reduce blood clots. It is also called blood thinner therapy. Blood clots that form in your blood vessels can be dangerous. They can break loose and travel to your heart, lungs, or brain. This increases your risk of a heart attack or stroke. Anticoagulant therapy causes blood to clot more slowly.  You may need anticoagulant therapy if you have:   A medical condition that increases the likelihood that blood clots will form.   A heart defect or a problem with heart rhythm.  It is also a common treatment after heart surgery, such as valve replacement.  WHAT ARE COMMON TYPES OF ANTICOAGULANT THERAPY?  Anticoagulant medicine can be injected or taken by mouth.If you need anticoagulant therapy quickly at the hospital, the medicine may be injected under your skin or given through an IV tube. Heparin is a common example of an anticoagulant that you may get at the hospital.  Most anticoagulant therapy is in the form of pills that you take at home every day. These may include:   Aspirin. This common blood thinner works by preventing blood cells (platelets) from sticking together to form a clot. Aspirin is not as strong as anticoagulants that slow down the time that it takes for your body to form a clot.   Clopidogrel. This is a newer type of drug that affects platelets. It is stronger than aspirin.   Warfarin. This is the most common anticoagulant. It changes the way your body uses vitamin K, a vitamin that helps your blood to clot. The risk of bleeding is higher with warfarin than with aspirin. You will need frequent blood tests to make sure you are taking the safest amount.   New anticoagulants. Several new drugs have been approved. They are all taken by mouth. Studies show that these drugs work as well as warfarin. They do not require blood testing. They may cause less bleeding  risk than warfarin.  WHAT DO I NEED TO REMEMBER WHEN TAKING ANTICOAGULANT THERAPY?  Anticoagulant therapy decreases your risk of forming a blood clot, but it increases your risk of bleeding. Work closely with your health care provider to make sure you are taking your medicine safely. These tips can help:   Learn ways to reduce your risk of bleeding.   If you are taking warfarin:  ? Have blood tests as ordered by your health care provider.  ? Do not make any sudden changes to your diet. Vitamin K in your diet can make warfarin less effective.  ? Do not get pregnant. This medicine may cause birth defects.   Take your medicine at the same time every day. If you forget to take your medicine, take it as soon as you remember. If you miss a whole day, do not double your dose of medicine. Take your normal dose and call your health care provider to check in.   Do not stop taking your medicine on your own.   Tell your health care provider before you start taking any new medicine, vitamin, or herbal product. Some of these could interfere with your therapy.   Tell all of your health care providers that you are on anticoagulant therapy.   Do not have surgery, medical procedures, or dental work until you tell your health care provider that you are on anticoagulant therapy.  WHAT CAN AFFECT HOW ANTICOAGULANTS WORK?  Certain foods, vitamins, medicines, supplements, and herbal   medicines change the way that anticoagulant therapy works. They may increase or decrease the effects of your anticoagulant therapy. Either result can be dangerous for you.   Many over-the-counter medicines for pain, colds, or stomach problems interfere with anticoagulant therapy. Take these only as told by your health care provider.   Do not drink alcohol. It can interfere with your medicine and increase your risk of an injury that causes bleeding.   If you are taking warfarin, do not begin eating more foods that contain vitamin K. These include  leafy green vegetables. Ask your health care provider if you should avoid any foods.  WHAT ARE SOME WAYS TO PREVENT BLEEDING?  You can prevent bleeding by taking certain precautions:   Be extra careful when you use knives, scissors, or other sharp objects.   Use an electric razor instead of a blade.   Do not use toothpicks.   Use a soft toothbrush.   Wear shoes that have nonskid soles.   Use bath mats and handrails in your bathroom.   Wear gloves while you do yard work.   Wear a helmet when you ride a bike.   Wear your seat belt.   Prevent falls by removing loose rugs and extension cords from areas where you walk.   Do not play contact sports or participate in other activities that have a high risk of injury.  WHEN SHOULD I CONTACT MY HEALTH CARE PROVIDER?  Call your health care provider if:   You miss a dose of medicine:  ? And you are not sure what to do.  ? For more than one day.   You have:  ? Menstrual bleeding that is heavier than normal.  ? Blood in your urine.  ? A bloody nose or bleeding gums.  ? Easy bruising.  ? Blood in your stool (feces) or have black and tarry stool.  ? Side effects from your medicine.   You feel weak or dizzy.   You become pregnant.  Seek immediate medical care if:   You have bleeding that will not stop.   You have sudden and severe headache or belly pain.   You vomit or you cough up bright red blood.   You have a severe blow to your head.  WHAT ARE SOME QUESTIONS TO ASK MY HEALTH CARE PROVIDER?   What is the best anticoagulant therapy for my condition?   What side effects should I watch for?   When should I take my medicine? What should I do if I forget to take it?   Will I need to have regular blood tests?   Do I need to change my diet? Are there foods or drinks that I should avoid?   What activities are safe for me?   What should I do if I want to get pregnant?  This information is not intended to replace advice given to you by your health care provider.  Make sure you discuss any questions you have with your health care provider.  Document Released: 12/12/2014 Document Reviewed: 12/12/2014  Elsevier Interactive Patient Education  2017 [employer]

## 2017-12-16 ENCOUNTER — Encounter: Payer: Self-pay | Admitting: Cardiology

## 2017-12-16 ENCOUNTER — Ambulatory Visit: Payer: BLUE CROSS/BLUE SHIELD | Admitting: Cardiology

## 2017-12-16 VITALS — BP 120/78 | HR 66 | Ht 68.0 in | Wt 229.4 lb

## 2017-12-16 DIAGNOSIS — I10 Essential (primary) hypertension: Secondary | ICD-10-CM | POA: Diagnosis not present

## 2017-12-16 DIAGNOSIS — Z952 Presence of prosthetic heart valve: Secondary | ICD-10-CM

## 2018-01-05 DIAGNOSIS — R309 Painful micturition, unspecified: Secondary | ICD-10-CM | POA: Diagnosis not present

## 2018-01-05 DIAGNOSIS — L293 Anogenital pruritus, unspecified: Secondary | ICD-10-CM | POA: Diagnosis not present

## 2018-01-06 ENCOUNTER — Other Ambulatory Visit: Payer: Self-pay | Admitting: Pediatrics

## 2018-01-06 DIAGNOSIS — E119 Type 2 diabetes mellitus without complications: Secondary | ICD-10-CM

## 2018-01-21 LAB — HM DIABETES EYE EXAM

## 2018-02-03 ENCOUNTER — Ambulatory Visit: Payer: BLUE CROSS/BLUE SHIELD | Admitting: Nurse Practitioner

## 2018-02-06 ENCOUNTER — Other Ambulatory Visit: Payer: Self-pay | Admitting: Nurse Practitioner

## 2018-02-06 DIAGNOSIS — E119 Type 2 diabetes mellitus without complications: Secondary | ICD-10-CM

## 2018-02-10 ENCOUNTER — Ambulatory Visit: Payer: BLUE CROSS/BLUE SHIELD | Admitting: Nurse Practitioner

## 2018-02-17 ENCOUNTER — Ambulatory Visit: Payer: BLUE CROSS/BLUE SHIELD | Admitting: Nurse Practitioner

## 2018-02-26 ENCOUNTER — Ambulatory Visit: Payer: BLUE CROSS/BLUE SHIELD | Admitting: Nurse Practitioner

## 2018-02-26 ENCOUNTER — Encounter: Payer: Self-pay | Admitting: Nurse Practitioner

## 2018-02-26 VITALS — BP 131/75 | HR 71 | Temp 97.4°F | Ht 68.0 in | Wt 235.0 lb

## 2018-02-26 DIAGNOSIS — D696 Thrombocytopenia, unspecified: Secondary | ICD-10-CM | POA: Diagnosis not present

## 2018-02-26 DIAGNOSIS — Z6834 Body mass index (BMI) 34.0-34.9, adult: Secondary | ICD-10-CM | POA: Diagnosis not present

## 2018-02-26 DIAGNOSIS — D649 Anemia, unspecified: Secondary | ICD-10-CM | POA: Diagnosis not present

## 2018-02-26 DIAGNOSIS — K219 Gastro-esophageal reflux disease without esophagitis: Secondary | ICD-10-CM

## 2018-02-26 DIAGNOSIS — I10 Essential (primary) hypertension: Secondary | ICD-10-CM

## 2018-02-26 DIAGNOSIS — E119 Type 2 diabetes mellitus without complications: Secondary | ICD-10-CM

## 2018-02-26 DIAGNOSIS — Z7901 Long term (current) use of anticoagulants: Secondary | ICD-10-CM

## 2018-02-26 LAB — BAYER DCA HB A1C WAIVED: HB A1C (BAYER DCA - WAIVED): 8.6 % — ABNORMAL HIGH (ref <7.0)

## 2018-02-26 LAB — COAGUCHEK XS/INR WAIVED
INR: 2.3 — AB (ref 0.9–1.1)
PROTHROMBIN TIME: 27.7 s

## 2018-02-26 MED ORDER — METFORMIN HCL ER 500 MG PO TB24: 500.0000 mg | ORAL_TABLET | Freq: Two times a day (BID) | ORAL | 1 refills | Status: DC

## 2018-02-26 MED ORDER — RANITIDINE HCL 150 MG PO TABS: 150.0000 mg | ORAL_TABLET | Freq: Every day | ORAL | 1 refills | Status: DC

## 2018-02-26 MED ORDER — ROSUVASTATIN CALCIUM 10 MG PO TABS: 10.0000 mg | ORAL_TABLET | Freq: Every day | ORAL | 3 refills | Status: DC

## 2018-02-26 MED ORDER — INSULIN DEGLUDEC 200 UNIT/ML ~~LOC~~ SOPN: 60.0000 [IU] | PEN_INJECTOR | Freq: Every day | SUBCUTANEOUS | 3 refills | Status: DC

## 2018-02-26 MED ORDER — WARFARIN SODIUM 5 MG PO TABS: 2.5000 mg | ORAL_TABLET | ORAL | 5 refills | Status: DC

## 2018-02-26 NOTE — Addendum Note (Signed)
Addended by: Chevis Pretty on: 02/26/2018 02:43 PM   Modules accepted: Orders

## 2018-02-26 NOTE — Progress Notes (Addendum)
Subjective:    Patient ID: Anna Salinas, female    DOB: 30-Apr-1970, 48 y.o.   MRN: [id number]   Chief Complaint: medical management of chronic issues  HPI:  1. Essential hypertension, benign  No c/o chest pain, sob or headache. Does not check at home. BP Readings from Last 3 Encounters:  12/16/17 120/78  12/15/17 123/79  11/27/17 114/74     2. Thrombocytopenia (HCC)  Last platelet count was 138. She is seeing hematology  3. BMI 34.0-34.9,adult  No recent weight changes  4. Symptomatic anemia  Last hgb was 11.1. she denies any fatigue    Indication: artificial heart valve Bleeding signs/symptoms: None Thromboembolic signs/symptoms: None  Missed Coumadin doses: None Medication changes: no Dietary changes: no Bacterial/viral infection: no Other concerns: no    Outpatient Encounter Medications as of 02/26/2018  Medication Sig  . acetaminophen (TYLENOL) 325 MG tablet Take 2 tablets (650 mg total) by mouth every 4 (four) hours as needed for moderate pain or fever.  Marland Kitchen glucose blood (ONETOUCH VERIO) test strip Use to check BG up to bid.  . Insulin Degludec 200 UNIT/ML SOPN INJECT 56 UNITS INTO THE SKIN DAILY.  Marland Kitchen Insulin Pen Needle (DROPLET PEN NEEDLES) 32G X 4 MM MISC Use with insulin pen daily as directed  . metFORMIN (GLUCOPHAGE-XR) 500 MG 24 hr tablet TAKE 1 TABLET BY MOUTH TWICE A DAY  . nystatin-triamcinolone ointment (MYCOLOG) Apply topically 2 (two) times daily.  [name] DELICA LANCETS FINE MISC Use to check BG up to bid  . ranitidine (ZANTAC) 150 MG tablet Take 1 tablet (150 mg total) by mouth daily.  . rosuvastatin (CRESTOR) 10 MG tablet Take 1 tablet (10 mg total) by mouth daily.  Marland Kitchen triamcinolone cream (KENALOG) 0.1 % Apply 1 application topically 2 (two) times daily. (Patient taking differently: Apply 1 application topically 2 (two) times daily as needed (irritation). )  . warfarin (COUMADIN) 5 MG tablet 1/2-1 tablet per day as rx by pcp (Patient taking  differently: Take 2.5-5 mg by mouth See admin instructions. Take 2.5 mg by mouth daily except take 5 mg by mouth on Monday, Wednesday and Friday)       New complaints: Having problems sleeping ta night. She has tried OTC meds and that has not helped. Says she has problem falling asleep and staying asleep.  Social history: Lives with husband and sons   Review of Systems  Constitutional: Negative for activity change and appetite change.  HENT: Negative.   Eyes: Negative for pain.  Respiratory: Negative for shortness of breath.   Cardiovascular: Negative for chest pain, palpitations and leg swelling.  Gastrointestinal: Negative for abdominal pain.  Endocrine: Negative for polydipsia.  Genitourinary: Negative.   Skin: Negative for rash.  Neurological: Negative for dizziness, weakness and headaches.  Hematological: Does not bruise/bleed easily.  Psychiatric/Behavioral: Negative.   All other systems reviewed and are negative.      Objective:   Physical Exam Vitals signs and nursing note reviewed.  Constitutional:      General: She is not in acute distress.    Appearance: Normal appearance. She is well-developed.  HENT:     Head: Normocephalic.     Nose: Nose normal.  Eyes:     Pupils: Pupils are equal, round, and reactive to light.  Neck:     Musculoskeletal: Normal range of motion and neck supple.     Vascular: No carotid bruit or JVD.  Cardiovascular:     Rate and Rhythm: Normal  rate and regular rhythm.     Heart sounds: Normal heart sounds.  Pulmonary:     Effort: Pulmonary effort is normal. No respiratory distress.     Breath sounds: Normal breath sounds. No wheezing or rales.  Chest:     Chest wall: No tenderness.  Abdominal:     General: Bowel sounds are normal. There is no distension or abdominal bruit.     Palpations: Abdomen is soft. There is no hepatomegaly, splenomegaly, mass or pulsatile mass.     Tenderness: There is no abdominal tenderness.    Musculoskeletal: Normal range of motion.  Lymphadenopathy:     Cervical: No cervical adenopathy.  Skin:    General: Skin is warm and dry.  Neurological:     Mental Status: She is alert and oriented to person, place, and time.     Deep Tendon Reflexes: Reflexes are normal and symmetric.  Psychiatric:        Behavior: Behavior normal.        Thought Content: Thought content normal.        Judgment: Judgment normal.    BP 131/75   Pulse 71   Temp (!) 97.4 F (36.3 C) (Oral)   Ht '5\' 8"'$  (1.727 m)   Wt 235 lb (106.6 kg)   LMP 04/14/2017   BMI 35.73 kg/m   hgba1c 8.6  INR 2.3      Assessment & Plan:  Anna Salinas comes in today with chief complaint of Medical Management of Chronic Issues   Diagnosis and orders addressed:  1. Essential hypertension, benign Low sodium diet - CMP14+EGFR  2. Thrombocytopenia ([place]) - CoaguChek XS/INR Waived - Ambulatory referral to Hematology  3. BMI 34.0-34.9,adult Discussed diet and exercise for person with BMI >25 Will recheck weight in 3-6 months  4. Symptomatic anemia Labs pending  5. Diabetes mellitus type II, non insulin dependent (HCC) Continue to watch carbs in diet - Bayer DCA Hb A1c Waived - Lipid panel - Microalbumin / creatinine urine ratio  6. Gastroesophageal reflux disease without esophagitis Avoid spicy foods Do not eat 2 hours prior to bedtime  7. Long term current use of anticoagulant therapy See anticoag documentation   Labs pending Health Maintenance reviewed Diet and exercise encouraged  Follow up plan: 3 months 1 month INR check   Mary-Margaret Hassell Done, FNP

## 2018-02-27 LAB — CMP14+EGFR
ALK PHOS: 77 IU/L (ref 39–117)
ALT: 45 IU/L — ABNORMAL HIGH (ref 0–32)
AST: 44 IU/L — AB (ref 0–40)
Albumin/Globulin Ratio: 1.4 (ref 1.2–2.2)
Albumin: 3.9 g/dL (ref 3.8–4.8)
BILIRUBIN TOTAL: 0.5 mg/dL (ref 0.0–1.2)
BUN/Creatinine Ratio: 10 (ref 9–23)
BUN: 8 mg/dL (ref 6–24)
CHLORIDE: 100 mmol/L (ref 96–106)
CO2: 22 mmol/L (ref 20–29)
CREATININE: 0.79 mg/dL (ref 0.57–1.00)
Calcium: 9.1 mg/dL (ref 8.7–10.2)
GFR calc Af Amer: 105 mL/min/{1.73_m2} (ref >59)
GFR calc non Af Amer: 91 mL/min/{1.73_m2} (ref >59)
GLOBULIN, TOTAL: 2.7 g/dL (ref 1.5–4.5)
Glucose: 246 mg/dL — ABNORMAL HIGH (ref 65–99)
POTASSIUM: 3.8 mmol/L (ref 3.5–5.2)
SODIUM: 138 mmol/L (ref 134–144)
Total Protein: 6.6 g/dL (ref 6.0–8.5)

## 2018-02-27 LAB — CBC WITH DIFFERENTIAL/PLATELET
BASOS ABS: 0.1 10*3/uL (ref 0.0–0.2)
BASOS: 1 %
EOS (ABSOLUTE): 0.2 10*3/uL (ref 0.0–0.4)
Eos: 2 %
HEMOGLOBIN: 11.9 g/dL (ref 11.1–15.9)
Hematocrit: 37.1 % (ref 34.0–46.6)
IMMATURE GRANS (ABS): 0 10*3/uL (ref 0.0–0.1)
IMMATURE GRANULOCYTES: 0 %
LYMPHS: 21 %
Lymphocytes Absolute: 1.8 10*3/uL (ref 0.7–3.1)
MCH: 25.1 pg — AB (ref 26.6–33.0)
MCHC: 32.1 g/dL (ref 31.5–35.7)
MCV: 78 fL — ABNORMAL LOW (ref 79–97)
MONOCYTES: 5 %
Monocytes Absolute: 0.4 10*3/uL (ref 0.1–0.9)
NEUTROS ABS: 5.8 10*3/uL (ref 1.4–7.0)
NEUTROS PCT: 71 %
Platelets: 151 10*3/uL (ref 150–450)
RBC: 4.74 x10E6/uL (ref 3.77–5.28)
RDW: 16 % — ABNORMAL HIGH (ref 11.7–15.4)
WBC: 8.2 10*3/uL (ref 3.4–10.8)

## 2018-02-27 LAB — LIPID PANEL
CHOL/HDL RATIO: 5.6 ratio — AB (ref 0.0–4.4)
CHOLESTEROL TOTAL: 168 mg/dL (ref 100–199)
HDL: 30 mg/dL — ABNORMAL LOW (ref >39)
LDL Calculated: 96 mg/dL (ref 0–99)
Triglycerides: 208 mg/dL — ABNORMAL HIGH (ref 0–149)
VLDL Cholesterol Cal: 42 mg/dL — ABNORMAL HIGH (ref 5–40)

## 2018-03-05 ENCOUNTER — Inpatient Hospital Stay (HOSPITAL_COMMUNITY): Payer: BLUE CROSS/BLUE SHIELD | Attending: Hematology | Admitting: Hematology

## 2018-03-05 ENCOUNTER — Inpatient Hospital Stay (HOSPITAL_COMMUNITY): Payer: BLUE CROSS/BLUE SHIELD

## 2018-03-05 ENCOUNTER — Encounter (HOSPITAL_COMMUNITY): Payer: Self-pay | Admitting: Hematology

## 2018-03-05 ENCOUNTER — Other Ambulatory Visit: Payer: Self-pay

## 2018-03-05 VITALS — BP 140/78 | HR 76 | Temp 98.3°F | Resp 16 | Ht 68.0 in | Wt 235.0 lb

## 2018-03-05 DIAGNOSIS — Z952 Presence of prosthetic heart valve: Secondary | ICD-10-CM | POA: Diagnosis not present

## 2018-03-05 DIAGNOSIS — Z7901 Long term (current) use of anticoagulants: Secondary | ICD-10-CM | POA: Diagnosis not present

## 2018-03-05 DIAGNOSIS — I10 Essential (primary) hypertension: Secondary | ICD-10-CM

## 2018-03-05 DIAGNOSIS — D649 Anemia, unspecified: Secondary | ICD-10-CM

## 2018-03-05 DIAGNOSIS — E119 Type 2 diabetes mellitus without complications: Secondary | ICD-10-CM | POA: Diagnosis not present

## 2018-03-05 DIAGNOSIS — D696 Thrombocytopenia, unspecified: Secondary | ICD-10-CM | POA: Diagnosis not present

## 2018-03-05 DIAGNOSIS — Z90710 Acquired absence of both cervix and uterus: Secondary | ICD-10-CM

## 2018-03-05 LAB — CBC WITH DIFFERENTIAL/PLATELET
Abs Immature Granulocytes: 0.03 10*3/uL (ref 0.00–0.07)
BASOS ABS: 0 10*3/uL (ref 0.0–0.1)
Basophils Relative: 0 %
EOS ABS: 0.2 10*3/uL (ref 0.0–0.5)
EOS PCT: 2 %
HEMATOCRIT: 42.3 % (ref 36.0–46.0)
HEMOGLOBIN: 12.7 g/dL (ref 12.0–15.0)
Immature Granulocytes: 0 %
LYMPHS ABS: 2.1 10*3/uL (ref 0.7–4.0)
LYMPHS PCT: 21 %
MCH: 24.6 pg — AB (ref 26.0–34.0)
MCHC: 30 g/dL (ref 30.0–36.0)
MCV: 81.8 fL (ref 80.0–100.0)
MONO ABS: 0.5 10*3/uL (ref 0.1–1.0)
MONOS PCT: 6 %
NRBC: 0 % (ref 0.0–0.2)
Neutro Abs: 6.9 10*3/uL (ref 1.7–7.7)
Neutrophils Relative %: 71 %
Platelets: 155 10*3/uL (ref 150–400)
RBC: 5.17 MIL/uL — ABNORMAL HIGH (ref 3.87–5.11)
RDW: 17.5 % — AB (ref 11.5–15.5)
WBC: 9.7 10*3/uL (ref 4.0–10.5)

## 2018-03-05 LAB — COMPREHENSIVE METABOLIC PANEL
ALK PHOS: 76 U/L (ref 38–126)
ALT: 30 U/L (ref 0–44)
AST: 31 U/L (ref 15–41)
Albumin: 4.1 g/dL (ref 3.5–5.0)
Anion gap: 8 (ref 5–15)
BUN: 10 mg/dL (ref 6–20)
CALCIUM: 9 mg/dL (ref 8.9–10.3)
CO2: 26 mmol/L (ref 22–32)
CREATININE: 0.63 mg/dL (ref 0.44–1.00)
Chloride: 100 mmol/L (ref 98–111)
GFR calc non Af Amer: >60 mL/min (ref >60)
Glucose, Bld: 267 mg/dL — ABNORMAL HIGH (ref 70–99)
Potassium: 4.1 mmol/L (ref 3.5–5.1)
SODIUM: 134 mmol/L — AB (ref 135–145)
Total Bilirubin: 0.5 mg/dL (ref 0.3–1.2)
Total Protein: 7.6 g/dL (ref 6.5–8.1)

## 2018-03-05 LAB — VITAMIN B12: Vitamin B-12: 381 pg/mL (ref 180–914)

## 2018-03-05 LAB — IRON AND TIBC
Iron: 43 ug/dL (ref 28–170)
Saturation Ratios: 9 % — ABNORMAL LOW (ref 10.4–31.8)
TIBC: 496 ug/dL — AB (ref 250–450)
UIBC: 453 ug/dL

## 2018-03-05 LAB — RETICULOCYTES
Immature Retic Fract: 19.8 % — ABNORMAL HIGH (ref 2.3–15.9)
RBC.: 5.17 MIL/uL — ABNORMAL HIGH (ref 3.87–5.11)
Retic Count, Absolute: 110.6 10*3/uL (ref 19.0–186.0)
Retic Ct Pct: 2.1 % (ref 0.4–3.1)

## 2018-03-05 LAB — LACTATE DEHYDROGENASE: LDH: 199 U/L — AB (ref 98–192)

## 2018-03-05 LAB — FOLATE: FOLATE: 9.3 ng/mL (ref >5.9)

## 2018-03-05 LAB — FERRITIN: Ferritin: 19 ng/mL (ref 11–307)

## 2018-03-05 NOTE — Assessment & Plan Note (Signed)
1.  Intermittent thrombocytopenia: - She has history of intermittent mild to moderate thrombocytopenia since 2016 when she had her aortic valve replacement with a mechanical valve. - She is on Coumadin.  She denies any bleeding per rectum or melena. - She had menorrhagia when she was started on Coumadin and underwent hysterectomy on 11/26/2017. - Most recent low platelet count was in November 2019 when it was 138. -Denies any personal history or family history of connective tissue disorders.  Denies any miscarriages.  Denies any prior history of thrombosis. -Differential diagnosis includes immune mediated thrombocytopenia. - We will rule out nutritional deficiencies by checking Z61, folic acid, copper levels.  We will also check hepatitis, H. pylori.  ANA, SPEP and rheumatoid factor will be sent.  Will review her smear. -We will see her back in 3 to 4 weeks for follow-up.  2.  Microcytic anemia: -She has a very mild anemia with hemoglobin between 11 and 12.  MCV is low at 78. -Denies any bleeding per rectum or melena. -We will check stool for occult blood.  We will also check ferritin and iron panel.

## 2018-03-05 NOTE — Patient Instructions (Signed)
[facility] at [facility] Discharge Instructions     Thank you for choosing [facility] at [facility] to provide your oncology and hematology care.  To afford each patient quality time with our provider, please arrive at least 15 minutes before your scheduled appointment time.   If you have a lab appointment with the Cancer Center please come in thru the  Main Entrance and check in at the main information desk  You need to re-schedule your appointment should you arrive 10 or more minutes late.  We strive to give you quality time with our providers, and arriving late affects you and other patients whose appointments are after yours.  Also, if you no show three or more times for appointments you may be dismissed from the clinic at the providers discretion.     Again, thank you for choosing [facility].  Our hope is that these requests will decrease the amount of time that you wait before being seen by our physicians.       _____________________________________________________________  Should you have questions after your visit to [facility], please contact our office at [phone] between the hours of 8:00 a.m. and 4:30 p.m.  Voicemails left after 4:00 p.m. will not be returned until the following business day.  For prescription refill requests, have your pharmacy contact our office and allow 72 hours.    Cancer Center Support Programs:   > Cancer Support Group  2nd Tuesday of the month 1pm-2pm, Journey Room    

## 2018-03-05 NOTE — Progress Notes (Signed)
CONSULT NOTE  Patient Care Team: Chevis Pretty, FNP as PCP - General (Nurse Practitioner) Martinique, Peter M, MD as PCP - Cardiology (Cardiology) Royce Macadamia D., PA-C Martinique, Peter M, MD as Consulting Physician (Cardiology) Gari Crown, MD as Consulting Physician (Unknown Physician Specialty)  CHIEF COMPLAINTS/PURPOSE OF CONSULTATION: Intermittent Thrombocytopenia and anemia   HISTORY OF PRESENTING ILLNESS:  Anna Salinas 48 y.o. female is here because of thrombocytopenia and anemia. She reports having an aortic valve replacement in 2016 and that is when she started to hear her platelet counts were decreased. She also had a hysterectomy in November and was on a course of antibiotics for that. She has not taken any other new medication including steroids or antibiotic since. She reports that she bruises easier since she started taking the blood thinner but denies any bleeding that she has noticed. Denies any nausea, vomiting, or diarrhea. Denies any new pains. Had not noticed any recent bleeding such as epistaxis, hematuria or hematochezia. Denies recent chest pain on exertion, shortness of breath on minimal exertion, pre-syncopal episodes, or palpitations. Denies any numbness or tingling in hands or feet. Denies any recent fevers, infections, or recent hospitalizations. Patient reports appetite at 100% and energy level at 75%. She had no prior history or diagnosis of cancer. Her age appropriate screening programs are up-to-date. She lives at home with her husband and child and performs all her own ADLs and activities. She is full functioning and works a full time job as a Radiation protection practitioner at [employer].  She has a family history of a maternal grandmother with colon cancer. Denies any other cancer in the family. She denies any blood disorders or any other family member with low platelets.   MEDICAL HISTORY:  Past Medical History:  Diagnosis Date  . Aortic stenosis due to  bicuspid aortic valve   . Blood transfusion without reported diagnosis   . Essential hypertension, benign   . Palpitations   . S/P laparoscopic hysterectomy 11/26/2017  . Type 2 diabetes mellitus ([place])     SURGICAL HISTORY: Past Surgical History:  Procedure Laterality Date  . AORTIC VALVE REPLACEMENT N/A 02/14/2014   Procedure: AORTIC VALVE REPLACEMENT (AVR);  Surgeon: Gaye Pollack, MD;  Location: [facility];  Service: Open Heart Surgery;  Laterality: N/A;  . CARDIAC CATHETERIZATION    . CESAREAN SECTION  2009  . CHEST TUBE INSERTION Right 03/27/2014   Procedure: CHEST TUBE INSERTION;  Surgeon: Grace Isaac, MD;  Location: [facility];  Service: Thoracic;  Laterality: Right;  . CYSTOSCOPY N/A 11/26/2017   Procedure: CYSTOSCOPY;  Surgeon: Sherlyn Hay, DO;  Location: [facility];  Service: Gynecology;  Laterality: N/A;  . INTRAOPERATIVE TRANSESOPHAGEAL ECHOCARDIOGRAM N/A 02/14/2014   Procedure: INTRAOPERATIVE TRANSESOPHAGEAL ECHOCARDIOGRAM;  Surgeon: Gaye Pollack, MD;  Location: [facility];  Service: Open Heart Surgery;  Laterality: N/A;  . LEFT AND RIGHT HEART CATHETERIZATION WITH CORONARY ANGIOGRAM N/A 01/25/2014   Procedure: LEFT AND RIGHT HEART CATHETERIZATION WITH CORONARY ANGIOGRAM;  Surgeon: Peter M Martinique, MD;  Location: [facility] CATH [facility];  Service: Cardiovascular;  Laterality: N/A;  . PERICARDIAL FLUID DRAINAGE N/A 03/27/2014   Procedure: DRAINAGE OF PERICARDIAL FLUID;  Surgeon: Grace Isaac, MD;  Location: [facility];  Service: Thoracic;  Laterality: N/A;  . PLEURAL EFFUSION DRAINAGE Right 03/27/2014   Procedure: DRAINAGE OF PLEURAL EFFUSION;  Surgeon: Grace Isaac, MD;  Location: [facility];  Service: Thoracic;  Laterality: Right;  Drainage of right pleural effusion  . SUBXYPHOID PERICARDIAL  WINDOW N/A 03/27/2014   Procedure: SUBXYPHOID PERICARDIAL WINDOW;  Surgeon: Grace Isaac, MD;  Location: [facility];  Service: Thoracic;  Laterality: N/A;  . TOTAL LAPAROSCOPIC HYSTERECTOMY WITH  SALPINGECTOMY Bilateral 11/26/2017   Procedure: TOTAL LAPAROSCOPIC HYSTERECTOMY WITH BILATERAL SALPINGECTOMY LEFT OOPHERECTOMY;  Surgeon: Sherlyn Hay, DO;  Location: [facility];  Service: Gynecology;  Laterality: Bilateral;  . TUBAL LIGATION      SOCIAL HISTORY: Social History   Socioeconomic History  . Marital status: Married    Spouse name: Not on file  . Number of children: 3  . Years of education: Not on file  . Highest education level: Not on file  Occupational History  . Occupation: Surveyor, quantity: [employer]  . Financial resource strain: Not on file  . Food insecurity:    Worry: Not on file    Inability: Not on file  . Transportation needs:    Medical: Not on file    Non-medical: Not on file  Tobacco Use  . Smoking status: Never Smoker  . Smokeless tobacco: Never Used  Substance and Sexual Activity  . Alcohol use: No    Alcohol/week: 0.0 standard drinks  . Drug use: No  . Sexual activity: Not on file  Lifestyle  . Physical activity:    Days per week: Not on file    Minutes per session: Not on file  . Stress: Not on file  Relationships  . Social connections:    Talks on phone: Not on file    Gets together: Not on file    Attends religious service: Not on file    Active member of club or organization: Not on file    Attends meetings of clubs or organizations: Not on file    Relationship status: Not on file  . Intimate partner violence:    Fear of current or ex partner: Not on file    Emotionally abused: Not on file    Physically abused: Not on file    Forced sexual activity: Not on file  Other Topics Concern  . Not on file  Social History Narrative  . Not on file    FAMILY HISTORY: Family History  Problem Relation Age of Onset  . Diabetes Mother   . Heart disease Mother   . Colon cancer Maternal Grandmother   . Brain cancer Brother   . Crohn's disease Brother     ALLERGIES:  is allergic to glipizide;  metoprolol; and shellfish allergy.  MEDICATIONS:  Current Outpatient Medications  Medication Sig Dispense Refill  . Insulin Pen Needle (DROPLET PEN NEEDLES) 32G X 4 MM MISC Use with insulin pen daily as directed 100 each 2  . metFORMIN (GLUCOPHAGE-XR) 500 MG 24 hr tablet Take 1 tablet (500 mg total) by mouth 2 (two) times daily. 180 tablet 1  . ONETOUCH DELICA LANCETS FINE MISC Use to check BG up to bid 100 each 5  . ranitidine (ZANTAC) 150 MG tablet Take 1 tablet (150 mg total) by mouth daily. 90 tablet 1  . rosuvastatin (CRESTOR) 10 MG tablet Take 1 tablet (10 mg total) by mouth daily. 90 tablet 3  . warfarin (COUMADIN) 5 MG tablet Take 0.5-1 tablets (2.5-5 mg total) by mouth See admin instructions. Take 2.5 mg by mouth daily except take 5 mg by mouth on Monday, Wednesday and Friday 30 tablet 5  . acetaminophen (TYLENOL) 325 MG tablet Take 2 tablets (650 mg total) by mouth every 4 (four) hours  as needed for moderate pain or fever. (Patient not taking: Reported on 03/05/2018) 60 tablet 2  . glucose blood (ONETOUCH VERIO) test strip Use to check BG up to bid. (Patient not taking: Reported on 03/05/2018) 100 each 5  . nystatin-triamcinolone ointment (MYCOLOG) Apply topically 2 (two) times daily. (Patient not taking: Reported on 03/05/2018) 30 g 0  . triamcinolone cream (KENALOG) 0.1 % Apply 1 application topically 2 (two) times daily. (Patient not taking: Reported on 03/05/2018) 453.6 g 1   No current facility-administered medications for this visit.     REVIEW OF SYSTEMS:   Constitutional: Denies fevers, chills or abnormal night sweats Eyes: Denies blurriness of vision, double vision or watery eyes Ears, nose, mouth, throat, and face: Denies mucositis or sore throat Respiratory: Denies cough, dyspnea or wheezes Cardiovascular: Denies palpitation, chest discomfort or lower extremity swelling Gastrointestinal:  Denies nausea, heartburn or change in bowel habits Skin: Denies abnormal skin  rashes Lymphatics: Denies new lymphadenopathy or easy bruising Neurological:Denies numbness, tingling or new weaknesses Behavioral/Psych: Mood is stable, no new changes  All other systems were reviewed with the patient and are negative.  PHYSICAL EXAMINATION: ECOG PERFORMANCE STATUS: 1 - Symptomatic but completely ambulatory  Vitals:   03/05/18 1300  BP: 140/78  Pulse: 76  Resp: 16  Temp: 98.3 F (36.8 C)  SpO2: 95%   Filed Weights   03/05/18 1300  Weight: 235 lb (106.6 kg)    GENERAL:alert, no distress and comfortable SKIN: skin color, texture, turgor are normal, no rashes or significant lesions EYES: normal, conjunctiva are pink and non-injected, sclera clear OROPHARYNX:no exudate, no erythema and lips, buccal mucosa, and tongue normal  NECK: supple, thyroid normal size, non-tender, without nodularity LYMPH:  no palpable lymphadenopathy in the cervical, axillary or inguinal LUNGS: clear to auscultation and percussion with normal breathing effort HEART: regular rate & rhythm and no murmurs and no lower extremity edema.  Mechanical heart sounds. ABDOMEN:abdomen soft, non-tender and normal bowel sounds Musculoskeletal:no cyanosis of digits and no clubbing  PSYCH: alert & oriented x 3 with fluent speech NEURO: no focal motor/sensory deficits  LABORATORY DATA:  I have reviewed the data as listed Recent Results (from the past 2160 hour(s))  CoaguChek XS/INR Waived     Status: Abnormal   Collection Time: 12/15/17  1:13 PM  Result Value Ref Range   INR 2.3 (H) 0.9 - 1.1   Prothrombin Time 28.1 sec    Comment: Differences in reagents, instruments, and pre-analytical variables can affect prothrombin time results.  These factors should be considered when comparing different prothrombin time test methods. Please Note: This test should not be used to monitor persons on heparin therapy.   HM DIABETES EYE EXAM     Status: None   Collection Time: 01/21/18 12:00 AM  Result Value  Ref Range   HM Diabetic Eye Exam No Retinopathy No Retinopathy    Comment: Truc Tran,OD  Bayer DCA Hb A1c Waived     Status: Abnormal   Collection Time: 02/26/18  2:19 PM  Result Value Ref Range   HB A1C (BAYER DCA - WAIVED) 8.6 (H) <7.0 %    Comment:                                       Diabetic Adult            <7.0  Healthy Adult        4.3 - 5.7                                                           (DCCT/NGSP) American Diabetes Association's Summary of Glycemic Recommendations for Adults with Diabetes: Hemoglobin A1c <7.0%. More stringent glycemic goals (A1c <6.0%) may further reduce complications at the cost of increased risk of hypoglycemia.   CoaguChek XS/INR Waived     Status: Abnormal   Collection Time: 02/26/18  2:19 PM  Result Value Ref Range   INR 2.3 (H) 0.9 - 1.1   Prothrombin Time 27.7 sec    Comment: Differences in reagents, instruments, and pre-analytical variables can affect prothrombin time results.  These factors should be considered when comparing different prothrombin time test methods. Please Note: This test should not be used to monitor persons on heparin therapy.   CMP14+EGFR     Status: Abnormal   Collection Time: 02/26/18  3:44 PM  Result Value Ref Range   Glucose 246 (H) 65 - 99 mg/dL   BUN 8 6 - 24 mg/dL   Creatinine, Ser 0.79 0.57 - 1.00 mg/dL   GFR calc non Af Amer 91 >59 mL/min/1.73   GFR calc Af Amer 105 >59 mL/min/1.73   BUN/Creatinine Ratio 10 9 - 23   Sodium 138 134 - 144 mmol/L   Potassium 3.8 3.5 - 5.2 mmol/L   Chloride 100 96 - 106 mmol/L   CO2 22 20 - 29 mmol/L   Calcium 9.1 8.7 - 10.2 mg/dL   Total Protein 6.6 6.0 - 8.5 g/dL   Albumin 3.9 3.8 - 4.8 g/dL    Comment:               **Please note reference interval change**   Globulin, Total 2.7 1.5 - 4.5 g/dL   Albumin/Globulin Ratio 1.4 1.2 - 2.2   Bilirubin Total 0.5 0.0 - 1.2 mg/dL   Alkaline Phosphatase 77 39 - 117 IU/L   AST 44 (H) 0 -  40 IU/L   ALT 45 (H) 0 - 32 IU/L  Lipid panel     Status: Abnormal   Collection Time: 02/26/18  3:44 PM  Result Value Ref Range   Cholesterol, Total 168 100 - 199 mg/dL   Triglycerides 208 (H) 0 - 149 mg/dL   HDL 30 (L) >39 mg/dL   VLDL Cholesterol Cal 42 (H) 5 - 40 mg/dL   LDL Calculated 96 0 - 99 mg/dL   Chol/HDL Ratio 5.6 (H) 0.0 - 4.4 ratio    Comment:                                   T. Chol/HDL Ratio                                             Men  Women                               1/2 Avg.Risk  3.4    3.3  Avg.Risk  5.0    4.4                                2X Avg.Risk  9.6    7.1                                3X Avg.Risk 23.4   11.0   CBC with Differential/Platelet     Status: Abnormal   Collection Time: 02/26/18  3:44 PM  Result Value Ref Range   WBC 8.2 3.4 - 10.8 x10E3/uL   RBC 4.74 3.77 - 5.28 x10E6/uL   Hemoglobin 11.9 11.1 - 15.9 g/dL   Hematocrit 37.1 34.0 - 46.6 %   MCV 78 (L) 79 - 97 fL   MCH 25.1 (L) 26.6 - 33.0 pg   MCHC 32.1 31.5 - 35.7 g/dL   RDW 16.0 (H) 11.7 - 15.4 %   Platelets 151 150 - 450 x10E3/uL   Neutrophils 71 Not Estab. %   Lymphs 21 Not Estab. %   Monocytes 5 Not Estab. %   Eos 2 Not Estab. %   Basos 1 Not Estab. %   Neutrophils Absolute 5.8 1.4 - 7.0 x10E3/uL   Lymphocytes Absolute 1.8 0.7 - 3.1 x10E3/uL   Monocytes Absolute 0.4 0.1 - 0.9 x10E3/uL   EOS (ABSOLUTE) 0.2 0.0 - 0.4 x10E3/uL   Basophils Absolute 0.1 0.0 - 0.2 x10E3/uL   Immature Granulocytes 0 Not Estab. %   Immature Grans (Abs) 0.0 0.0 - 0.1 x10E3/uL    RADIOGRAPHIC STUDIES: I have personally reviewed the radiological images as listed and agreed with the findings in the report. I have reviewed Francene Finders, NP's note and agree with the documentation.  I personally performed a face-to-face visit, made revisions and my assessment and plan is as follows.  ASSESSMENT & PLAN:  Thrombocytopenia ([place]) 1.  Intermittent thrombocytopenia: -  She has history of intermittent mild to moderate thrombocytopenia since 2016 when she had her aortic valve replacement with a mechanical valve. - She is on Coumadin.  She denies any bleeding per rectum or melena. - She had menorrhagia when she was started on Coumadin and underwent hysterectomy on 11/26/2017. - Most recent low platelet count was in November 2019 when it was 138. -Denies any personal history or family history of connective tissue disorders.  Denies any miscarriages.  Denies any prior history of thrombosis. -Differential diagnosis includes immune mediated thrombocytopenia. - We will rule out nutritional deficiencies by checking V88, folic acid, copper levels.  We will also check hepatitis, H. pylori.  ANA, SPEP and rheumatoid factor will be sent.  Will review her smear. -We will see her back in 3 to 4 weeks for follow-up.  2.  Microcytic anemia: -She has a very mild anemia with hemoglobin between 11 and 12.  MCV is low at 78. -Denies any bleeding per rectum or melena. -We will check stool for occult blood.  We will also check ferritin and iron panel.   All questions were answered. The patient knows to call the clinic with any problems, questions or concerns.     Derek Jack, MD 03/05/18 2:26 PM

## 2018-03-06 LAB — PROTEIN ELECTROPHORESIS, SERUM
A/G Ratio: 1.1 (ref 0.7–1.7)
ALBUMIN ELP: 3.7 g/dL (ref 2.9–4.4)
ALPHA-1-GLOBULIN: 0.3 g/dL (ref 0.0–0.4)
Alpha-2-Globulin: 0.6 g/dL (ref 0.4–1.0)
BETA GLOBULIN: 1.3 g/dL (ref 0.7–1.3)
GAMMA GLOBULIN: 1.4 g/dL (ref 0.4–1.8)
Globulin, Total: 3.5 g/dL (ref 2.2–3.9)
Total Protein ELP: 7.2 g/dL (ref 6.0–8.5)

## 2018-03-06 LAB — HEPATITIS PANEL, ACUTE
HCV Ab: <0.1 s/co ratio (ref 0.0–0.9)
HEP A IGM: NEGATIVE
HEP B C IGM: NEGATIVE
Hepatitis B Surface Ag: NEGATIVE

## 2018-03-06 LAB — H PYLORI, IGM, IGG, IGA AB
H PYLORI IGG: 0.26 {index_val} (ref 0.00–0.79)
H. Pylogi, Iga Abs: <9 units (ref 0.0–8.9)
H. Pylogi, Igm Abs: <9 units (ref 0.0–8.9)

## 2018-03-06 LAB — RHEUMATOID FACTOR: Rhuematoid fact SerPl-aCnc: <10 IU/mL (ref 0.0–13.9)

## 2018-03-06 LAB — FANA STAINING PATTERNS: Speckled Pattern: 1:320 {titer} — ABNORMAL HIGH

## 2018-03-06 LAB — ANTINUCLEAR ANTIBODIES, IFA: ANTINUCLEAR ANTIBODIES, IFA: POSITIVE — AB

## 2018-03-07 LAB — COPPER, SERUM: Copper: 113 ug/dL (ref 72–166)

## 2018-03-27 ENCOUNTER — Ambulatory Visit: Payer: BLUE CROSS/BLUE SHIELD | Admitting: Nurse Practitioner

## 2018-03-31 ENCOUNTER — Encounter (HOSPITAL_COMMUNITY): Payer: Self-pay | Admitting: Hematology

## 2018-03-31 ENCOUNTER — Other Ambulatory Visit: Payer: Self-pay

## 2018-03-31 ENCOUNTER — Inpatient Hospital Stay (HOSPITAL_COMMUNITY): Payer: BLUE CROSS/BLUE SHIELD | Attending: Hematology | Admitting: Hematology

## 2018-03-31 VITALS — BP 138/83 | HR 77 | Temp 98.3°F | Resp 16 | Wt 233.5 lb

## 2018-03-31 DIAGNOSIS — Z90710 Acquired absence of both cervix and uterus: Secondary | ICD-10-CM

## 2018-03-31 DIAGNOSIS — D696 Thrombocytopenia, unspecified: Secondary | ICD-10-CM | POA: Diagnosis not present

## 2018-03-31 DIAGNOSIS — D509 Iron deficiency anemia, unspecified: Secondary | ICD-10-CM | POA: Diagnosis not present

## 2018-03-31 NOTE — Patient Instructions (Addendum)
[facility] at [facility] Discharge Instructions  You were seen today by Dr. Delton Coombes. He went over your recent results, your iron level was a little low and he would like you to take an iron supplement, your Lupus test was positive but not to worry about if you are not having symptoms. He will see you back in 6 months for labs and follow up.   Thank you for choosing [place] at [facility] to provide your oncology and hematology care.  To afford each patient quality time with our provider, please arrive at least 15 minutes before your scheduled appointment time.   If you have a lab appointment with the [facility] please come in thru the  Main Entrance and check in at the main information desk  You need to re-schedule your appointment should you arrive 10 or more minutes late.  We strive to give you quality time with our providers, and arriving late affects you and other patients whose appointments are after yours.  Also, if you no show three or more times for appointments you may be dismissed from the clinic at the providers discretion.     Again, thank you for choosing [facility].  Our hope is that these requests will decrease the amount of time that you wait before being seen by our physicians.       _____________________________________________________________  Should you have questions after your visit to [facility], please contact our office at [phone] between the hours of 8:00 a.m. and 4:30 p.m.  Voicemails left after 4:00 p.m. will not be returned until the following business day.  For prescription refill requests, have your pharmacy contact our office and allow 72 hours.    Cancer Center Support Programs:   > Cancer Support Group  2nd Tuesday of the month 1pm-2pm, Journey Room

## 2018-03-31 NOTE — Progress Notes (Signed)
[address] [place], [place] [address]   CLINIC:  Medical Oncology/Hematology  PCP:  Chevis Pretty, [place] [address] [place] [address] [phone]   REASON FOR VISIT:  Follow-up for Intermittent Thrombocytopenia and anemia     INTERVAL HISTORY:  [name] 48 y.o. female returns for routine follow-up. She is here today by herself. She states that she is not currently having any joint pains or Lupus symptoms. She was educated on Lupus symptoms. Denies any nausea, vomiting, or diarrhea. Denies any new pains. Had not noticed any recent bleeding such as epistaxis, hematuria or hematochezia. Denies recent chest pain on exertion, shortness of breath on minimal exertion, pre-syncopal episodes, or palpitations. Denies any numbness or tingling in hands or feet. Denies any recent fevers, infections, or recent hospitalizations. Patient reports appetite at 75 % and energy level at 75 %.   REVIEW OF SYSTEMS:  Review of Systems  Respiratory: Positive for shortness of breath.   All other systems reviewed and are negative.    PAST MEDICAL/SURGICAL HISTORY:  Past Medical History:  Diagnosis Date  . Aortic stenosis due to bicuspid aortic valve   . Blood transfusion without reported diagnosis   . Essential hypertension, benign   . Palpitations   . S/P laparoscopic hysterectomy 11/26/2017  . Type 2 diabetes mellitus ([place])    Past Surgical History:  Procedure Laterality Date  . AORTIC VALVE REPLACEMENT N/A 02/14/2014   Procedure: AORTIC VALVE REPLACEMENT (AVR);  Surgeon: Gaye Pollack, MD;  Location: [facility];  Service: Open Heart Surgery;  Laterality: N/A;  . CARDIAC CATHETERIZATION    . CESAREAN SECTION  2009  . CHEST TUBE INSERTION Right 03/27/2014   Procedure: CHEST TUBE INSERTION;  Surgeon: Grace Isaac, MD;  Location: [facility];  Service: Thoracic;  Laterality: Right;  . CYSTOSCOPY N/A 11/26/2017   Procedure: CYSTOSCOPY;  Surgeon: Sherlyn Hay, DO;  Location: [facility];  Service: Gynecology;  Laterality: N/A;  . INTRAOPERATIVE TRANSESOPHAGEAL ECHOCARDIOGRAM N/A 02/14/2014   Procedure: INTRAOPERATIVE TRANSESOPHAGEAL ECHOCARDIOGRAM;  Surgeon: Gaye Pollack, MD;  Location: [facility];  Service: Open Heart Surgery;  Laterality: N/A;  . LEFT AND RIGHT HEART CATHETERIZATION WITH CORONARY ANGIOGRAM N/A 01/25/2014   Procedure: LEFT AND RIGHT HEART CATHETERIZATION WITH CORONARY ANGIOGRAM;  Surgeon: Peter M Martinique, MD;  Location: [facility] CATH [facility];  Service: Cardiovascular;  Laterality: N/A;  . PERICARDIAL FLUID DRAINAGE N/A 03/27/2014   Procedure: DRAINAGE OF PERICARDIAL FLUID;  Surgeon: Grace Isaac, MD;  Location: [facility];  Service: Thoracic;  Laterality: N/A;  . PLEURAL EFFUSION DRAINAGE Right 03/27/2014   Procedure: DRAINAGE OF PLEURAL EFFUSION;  Surgeon: Grace Isaac, MD;  Location: [facility];  Service: Thoracic;  Laterality: Right;  Drainage of right pleural effusion  . SUBXYPHOID PERICARDIAL WINDOW N/A 03/27/2014   Procedure: SUBXYPHOID PERICARDIAL WINDOW;  Surgeon: Grace Isaac, MD;  Location: [facility];  Service: Thoracic;  Laterality: N/A;  . TOTAL LAPAROSCOPIC HYSTERECTOMY WITH SALPINGECTOMY Bilateral 11/26/2017   Procedure: TOTAL LAPAROSCOPIC HYSTERECTOMY WITH BILATERAL SALPINGECTOMY LEFT OOPHERECTOMY;  Surgeon: Sherlyn Hay, DO;  Location: [facility];  Service: Gynecology;  Laterality: Bilateral;  . TUBAL LIGATION       SOCIAL HISTORY:  Social History   Socioeconomic History  . Marital status: Married    Spouse name: Not on file  . Number of children: 3  . Years of education: Not on file  . Highest education level: Not on file  Occupational History  .  Occupation: Surveyor, quantity: [employer]  . Financial resource strain: Not on file  . Food insecurity:    Worry: Not on file    Inability: Not on file  . Transportation needs:    Medical: Not on file    Non-medical: Not on file   Tobacco Use  . Smoking status: Never Smoker  . Smokeless tobacco: Never Used  Substance and Sexual Activity  . Alcohol use: No    Alcohol/week: 0.0 standard drinks  . Drug use: No  . Sexual activity: Not on file  Lifestyle  . Physical activity:    Days per week: Not on file    Minutes per session: Not on file  . Stress: Not on file  Relationships  . Social connections:    Talks on phone: Not on file    Gets together: Not on file    Attends religious service: Not on file    Active member of club or organization: Not on file    Attends meetings of clubs or organizations: Not on file    Relationship status: Not on file  . Intimate partner violence:    Fear of current or ex partner: Not on file    Emotionally abused: Not on file    Physically abused: Not on file    Forced sexual activity: Not on file  Other Topics Concern  . Not on file  Social History Narrative  . Not on file    FAMILY HISTORY:  Family History  Problem Relation Age of Onset  . Diabetes Mother   . Heart disease Mother   . Colon cancer Maternal Grandmother   . Brain cancer Brother   . Crohn's disease Brother     CURRENT MEDICATIONS:  Outpatient Encounter Medications as of 03/31/2018  Medication Sig  . metFORMIN (GLUCOPHAGE-XR) 500 MG 24 hr tablet Take 1 tablet (500 mg total) by mouth 2 (two) times daily.  . ranitidine (ZANTAC) 150 MG tablet Take 1 tablet (150 mg total) by mouth daily.  . rosuvastatin (CRESTOR) 10 MG tablet Take 1 tablet (10 mg total) by mouth daily.  Marland Kitchen warfarin (COUMADIN) 5 MG tablet Take 0.5-1 tablets (2.5-5 mg total) by mouth See admin instructions. Take 2.5 mg by mouth daily except take 5 mg by mouth on Monday, Wednesday and Friday  . [DISCONTINUED] acetaminophen (TYLENOL) 325 MG tablet Take 2 tablets (650 mg total) by mouth every 4 (four) hours as needed for moderate pain or fever. (Patient not taking: Reported on 03/05/2018)  . [DISCONTINUED] glucose blood (ONETOUCH VERIO) test strip  Use to check BG up to bid. (Patient not taking: Reported on 03/05/2018)  . [DISCONTINUED] Insulin Pen Needle (DROPLET PEN NEEDLES) 32G X 4 MM MISC Use with insulin pen daily as directed  . [DISCONTINUED] nystatin-triamcinolone ointment (MYCOLOG) Apply topically 2 (two) times daily. (Patient not taking: Reported on 03/05/2018)  . [DISCONTINUED] ONETOUCH DELICA LANCETS FINE MISC Use to check BG up to bid  . [DISCONTINUED] triamcinolone cream (KENALOG) 0.1 % Apply 1 application topically 2 (two) times daily. (Patient not taking: Reported on 03/05/2018)   No facility-administered encounter medications on file as of 03/31/2018.     ALLERGIES:  Allergies  Allergen Reactions  . Glipizide Nausea And Vomiting and Other (See Comments)    GI upset  . Metoprolol Other (See Comments)    "Made me sick"  . Shellfish Allergy Swelling    Throat and eyes were swollen.  Had difficulty breathing.  Was hospitalized.       PHYSICAL EXAM:  ECOG Performance status: 1  Vitals:   03/31/18 1011  BP: 138/83  Pulse: 77  Resp: 16  Temp: 98.3 F (36.8 C)  SpO2: 95%   Filed Weights   03/31/18 1011  Weight: 233 lb 8 oz (105.9 kg)    Physical Exam Constitutional:      Appearance: Normal appearance.  Cardiovascular:     Rate and Rhythm: Normal rate and regular rhythm.     Heart sounds: Normal heart sounds.  Pulmonary:     Effort: Pulmonary effort is normal.     Breath sounds: Normal breath sounds.  Abdominal:     General: Bowel sounds are normal. There is no distension.     Palpations: Abdomen is soft.  Musculoskeletal:        General: No swelling.  Skin:    General: Skin is warm.  Neurological:     General: No focal deficit present.     Mental Status: She is alert and oriented to person, place, and time.  Psychiatric:        Mood and Affect: Mood normal.        Behavior: Behavior normal.      LABORATORY DATA:  I have reviewed the labs as listed.  CBC    Component Value Date/Time   WBC  9.7 03/05/2018 1427   RBC 5.17 (H) 03/05/2018 1427   RBC 5.17 (H) 03/05/2018 1427   HGB 12.7 03/05/2018 1427   HGB 11.9 02/26/2018 1544   HCT 42.3 03/05/2018 1427   HCT 37.1 02/26/2018 1544   PLT 155 03/05/2018 1427   PLT 151 02/26/2018 1544   MCV 81.8 03/05/2018 1427   MCV 78 (L) 02/26/2018 1544   MCH 24.6 (L) 03/05/2018 1427   MCHC 30.0 03/05/2018 1427   RDW 17.5 (H) 03/05/2018 1427   RDW 16.0 (H) 02/26/2018 1544   LYMPHSABS 2.1 03/05/2018 1427   LYMPHSABS 1.8 02/26/2018 1544   MONOABS 0.5 03/05/2018 1427   EOSABS 0.2 03/05/2018 1427   EOSABS 0.2 02/26/2018 1544   BASOSABS 0.0 03/05/2018 1427   BASOSABS 0.1 02/26/2018 1544   CMP Latest Ref Rng & Units 03/05/2018 02/26/2018 11/27/2017  Glucose 70 - 99 mg/dL 267(H) 246(H) 231(H)  BUN 6 - 20 mg/dL 10 8 8   Creatinine 0.44 - 1.00 mg/dL 0.63 0.79 0.66  Sodium 135 - 145 mmol/L 134(L) 138 132(L)  Potassium 3.5 - 5.1 mmol/L 4.1 3.8 3.3(L)  Chloride 98 - 111 mmol/L 100 100 101  CO2 22 - 32 mmol/L 26 22 24   Calcium 8.9 - 10.3 mg/dL 9.0 9.1 7.8(L)  Total Protein 6.5 - 8.1 g/dL 7.6 6.6 6.0(L)  Total Bilirubin 0.3 - 1.2 mg/dL 0.5 0.5 1.3(H)  Alkaline Phos 38 - 126 U/L 76 77 34(L)  AST 15 - 41 U/L 31 44(H) 17  ALT 0 - 44 U/L 30 45(H) 17       DIAGNOSTIC IMAGING:  I have independently reviewed the scans and discussed with the patient.   I have reviewed Venita Lick LPN's note and agree with the documentation.  I personally performed a face-to-face visit, made revisions and my assessment and plan is as follows.    ASSESSMENT & PLAN:   Thrombocytopenia ([place]) 1.  Intermittent thrombocytopenia: - She has history of intermittent mild to moderate thrombocytopenia since 2016 when she had her aortic valve replacement with a mechanical valve. - She is on Coumadin.  She denies any bleeding per rectum  or melena. - She had menorrhagia when she was started on Coumadin and underwent hysterectomy on 11/26/2017. - Most recent low platelet  count was in November 2019 when it was 138. -Denies any personal history or family history of connective tissue disorders.  Denies any miscarriages.  Denies any prior history of thrombosis. -Differential diagnosis includes immune thrombocytopenia. -Nutritional deficiency work-up was negative.  Hepatitis panel and H. pylori work-up was negative. -Differential diagnosis includes immune mediated thrombocytopenia. - ANA was positive with a speckled pattern with a dilution of 1:320.  Patient does not have any clinical signs or symptoms of lupus. - Her repeat platelet count has normalized at 155. - I will see her back in 6 months with repeat blood count.  2.  Microcytic anemia: -Hemoglobin has normalized at 12.7.  MCV was 81.8. -SPEP was negative.  Ferritin was 19.  E09, folic acid and copper were normal. -She denies any bleeding per rectum or melena.       Orders placed this encounter:  Orders Placed This Encounter  Procedures  . CBC with Differential  . Ferritin  . Iron and TIBC      Derek Jack, MD [place] [phone]

## 2018-03-31 NOTE — Assessment & Plan Note (Signed)
1.  Intermittent thrombocytopenia: - She has history of intermittent mild to moderate thrombocytopenia since 2016 when she had her aortic valve replacement with a mechanical valve. - She is on Coumadin.  She denies any bleeding per rectum or melena. - She had menorrhagia when she was started on Coumadin and underwent hysterectomy on 11/26/2017. - Most recent low platelet count was in November 2019 when it was 138. -Denies any personal history or family history of connective tissue disorders.  Denies any miscarriages.  Denies any prior history of thrombosis. -Differential diagnosis includes immune thrombocytopenia. -Nutritional deficiency work-up was negative.  Hepatitis panel and H. pylori work-up was negative. -Differential diagnosis includes immune mediated thrombocytopenia. - ANA was positive with a speckled pattern with a dilution of 1:320.  Patient does not have any clinical signs or symptoms of lupus. - Her repeat platelet count has normalized at 155. - I will see her back in 6 months with repeat blood count.  2.  Microcytic anemia: -Hemoglobin has normalized at 12.7.  MCV was 81.8. -SPEP was negative.  Ferritin was 19.  S31, folic acid and copper were normal. -She denies any bleeding per rectum or melena.

## 2018-04-14 ENCOUNTER — Telehealth: Payer: Self-pay | Admitting: Cardiology

## 2018-04-14 NOTE — Telephone Encounter (Signed)
Follow up  ° ° °Patient is returning call.  °

## 2018-04-14 NOTE — Telephone Encounter (Signed)
Patient would like to know if she can get a letter stating she has health issues and it is not recommend for her to work during this time due to COVID-19.  If this can be done the letter can be faxed to [phone]

## 2018-04-14 NOTE — Telephone Encounter (Signed)
Returned call to patient she stated she needed a letter advising her to stay home due to Covid-19.Advised Dr.Jordan will be in office tomorrow.I will have him sign letter.

## 2018-04-14 NOTE — Telephone Encounter (Signed)
Called patient no answer.LMTC. 

## 2018-04-15 NOTE — Telephone Encounter (Signed)
New Message   Patient states she needs specific dates on her work note.  She would like Cheryl to call her back.

## 2018-04-15 NOTE — Telephone Encounter (Signed)
Follow up   Patient states that she has not received the letter via fax per the previous message. The fax # it can be sent to [phone].

## 2018-04-15 NOTE — Telephone Encounter (Signed)
Letter advising patient to stay out of work until 05/16/18 due to being high risk due for Covid -19 faxed to fax # [phone].

## 2018-04-15 NOTE — Telephone Encounter (Signed)
Spoke to patient note to stay out of work due to covid -19 faxed to [employer] at fax # [phone].

## 2018-04-15 NOTE — Telephone Encounter (Signed)
° °  Patient calling back, states letter should state out of work until [employer]

## 2018-05-01 ENCOUNTER — Telehealth: Payer: Self-pay | Admitting: Cardiology

## 2018-05-01 NOTE — Telephone Encounter (Signed)
  Patient is calling because her work needs another work note stating that she will be out of work April /19/20-05/04/20. Please fax to [phone]

## 2018-05-01 NOTE — Telephone Encounter (Signed)
Spoke to patient letter to stay out of work 4/19 to 05/18/18 faxed to [phone].

## 2018-05-01 NOTE — Telephone Encounter (Signed)
Called patient and she states that she needs another note for these dates, or she will have to go back into work.  Please advise.  Thanks!

## 2018-05-03 NOTE — Telephone Encounter (Signed)
Ok to send letter.  Peter Martinique MD, [facility]

## 2018-05-21 DIAGNOSIS — Z6835 Body mass index (BMI) 35.0-35.9, adult: Secondary | ICD-10-CM | POA: Diagnosis not present

## 2018-05-21 DIAGNOSIS — S59902A Unspecified injury of left elbow, initial encounter: Secondary | ICD-10-CM | POA: Diagnosis not present

## 2018-05-21 DIAGNOSIS — M25422 Effusion, left elbow: Secondary | ICD-10-CM | POA: Diagnosis not present

## 2018-05-21 DIAGNOSIS — S52124A Nondisplaced fracture of head of right radius, initial encounter for closed fracture: Secondary | ICD-10-CM | POA: Diagnosis not present

## 2018-05-21 DIAGNOSIS — S81011A Laceration without foreign body, right knee, initial encounter: Secondary | ICD-10-CM | POA: Diagnosis not present

## 2018-05-23 DIAGNOSIS — S8991XA Unspecified injury of right lower leg, initial encounter: Secondary | ICD-10-CM | POA: Diagnosis not present

## 2018-05-23 DIAGNOSIS — M79604 Pain in right leg: Secondary | ICD-10-CM | POA: Diagnosis not present

## 2018-05-23 DIAGNOSIS — Z6835 Body mass index (BMI) 35.0-35.9, adult: Secondary | ICD-10-CM | POA: Diagnosis not present

## 2018-05-27 DIAGNOSIS — Z6835 Body mass index (BMI) 35.0-35.9, adult: Secondary | ICD-10-CM | POA: Diagnosis not present

## 2018-05-27 DIAGNOSIS — M25471 Effusion, right ankle: Secondary | ICD-10-CM | POA: Diagnosis not present

## 2018-05-27 DIAGNOSIS — M25571 Pain in right ankle and joints of right foot: Secondary | ICD-10-CM | POA: Diagnosis not present

## 2018-05-27 DIAGNOSIS — S42402A Unspecified fracture of lower end of left humerus, initial encounter for closed fracture: Secondary | ICD-10-CM | POA: Diagnosis not present

## 2018-05-29 ENCOUNTER — Telehealth: Payer: Self-pay | Admitting: Nurse Practitioner

## 2018-05-29 ENCOUNTER — Other Ambulatory Visit: Payer: Self-pay

## 2018-06-01 ENCOUNTER — Ambulatory Visit (INDEPENDENT_AMBULATORY_CARE_PROVIDER_SITE_OTHER): Payer: BLUE CROSS/BLUE SHIELD | Admitting: Nurse Practitioner

## 2018-06-01 ENCOUNTER — Other Ambulatory Visit: Payer: Self-pay

## 2018-06-01 ENCOUNTER — Encounter: Payer: Self-pay | Admitting: Nurse Practitioner

## 2018-06-01 VITALS — BP 143/85 | HR 92 | Temp 97.9°F | Ht 68.0 in | Wt 233.0 lb

## 2018-06-01 DIAGNOSIS — Z6834 Body mass index (BMI) 34.0-34.9, adult: Secondary | ICD-10-CM

## 2018-06-01 DIAGNOSIS — E119 Type 2 diabetes mellitus without complications: Secondary | ICD-10-CM

## 2018-06-01 DIAGNOSIS — Z7901 Long term (current) use of anticoagulants: Secondary | ICD-10-CM

## 2018-06-01 DIAGNOSIS — I10 Essential (primary) hypertension: Secondary | ICD-10-CM | POA: Diagnosis not present

## 2018-06-01 DIAGNOSIS — D696 Thrombocytopenia, unspecified: Secondary | ICD-10-CM

## 2018-06-01 DIAGNOSIS — D649 Anemia, unspecified: Secondary | ICD-10-CM

## 2018-06-01 LAB — COAGUCHEK XS/INR WAIVED
INR: 3.1 — ABNORMAL HIGH (ref 0.9–1.1)
Prothrombin Time: 36.7 s

## 2018-06-01 LAB — BAYER DCA HB A1C WAIVED: HB A1C (BAYER DCA - WAIVED): 10.4 % — ABNORMAL HIGH (ref <7.0)

## 2018-06-01 MED ORDER — METFORMIN HCL ER 500 MG PO TB24: 500.0000 mg | ORAL_TABLET | Freq: Two times a day (BID) | ORAL | 1 refills | Status: AC

## 2018-06-01 MED ORDER — INSULIN DEGLUDEC 200 UNIT/ML ~~LOC~~ SOPN: 70.0000 [IU] | PEN_INJECTOR | Freq: Every day | SUBCUTANEOUS | 5 refills | Status: DC

## 2018-06-01 MED ORDER — ROSUVASTATIN CALCIUM 10 MG PO TABS: 10.0000 mg | ORAL_TABLET | Freq: Every day | ORAL | 3 refills | Status: AC

## 2018-06-01 MED ORDER — WARFARIN SODIUM 5 MG PO TABS: 2.5000 mg | ORAL_TABLET | ORAL | 5 refills | Status: AC

## 2018-06-01 NOTE — Patient Instructions (Signed)
Diabetes Mellitus and Nutrition, Adult  When you have diabetes (diabetes mellitus), it is very important to have healthy eating habits because your blood sugar (glucose) levels are greatly affected by what you eat and drink. Eating healthy foods in the appropriate amounts, at about the same times every day, can help you:  · Control your blood glucose.  · Lower your risk of heart disease.  · Improve your blood pressure.  · Reach or maintain a healthy weight.  Every person with diabetes is different, and each person has different needs for a meal plan. Your health care provider may recommend that you work with a diet and nutrition specialist (dietitian) to make a meal plan that is best for you. Your meal plan may vary depending on factors such as:  · The calories you need.  · The medicines you take.  · Your weight.  · Your blood glucose, blood pressure, and cholesterol levels.  · Your activity level.  · Other health conditions you have, such as heart or kidney disease.  How do carbohydrates affect me?  Carbohydrates, also called carbs, affect your blood glucose level more than any other type of food. Eating carbs naturally raises the amount of glucose in your blood. Carb counting is a method for keeping track of how many carbs you eat. Counting carbs is important to keep your blood glucose at a healthy level, especially if you use insulin or take certain oral diabetes medicines.  It is important to know how many carbs you can safely have in each meal. This is different for every person. Your dietitian can help you calculate how many carbs you should have at each meal and for each snack.  Foods that contain carbs include:  · Bread, cereal, rice, pasta, and crackers.  · Potatoes and corn.  · Peas, beans, and lentils.  · Milk and yogurt.  · Fruit and juice.  · Desserts, such as cakes, cookies, ice cream, and candy.  How does alcohol affect me?  Alcohol can cause a sudden decrease in blood glucose (hypoglycemia),  especially if you use insulin or take certain oral diabetes medicines. Hypoglycemia can be a life-threatening condition. Symptoms of hypoglycemia (sleepiness, dizziness, and confusion) are similar to symptoms of having too much alcohol.  If your health care provider says that alcohol is safe for you, follow these guidelines:  · Limit alcohol intake to no more than 1 drink per day for nonpregnant women and 2 drinks per day for men. One drink equals 12 oz of beer, 5 oz of wine, or 1½ oz of hard liquor.  · Do not drink on an empty stomach.  · Keep yourself hydrated with water, diet soda, or unsweetened iced tea.  · Keep in mind that regular soda, juice, and other mixers may contain a lot of sugar and must be counted as carbs.  What are tips for following this plan?    Reading food labels  · Start by checking the serving size on the "Nutrition Facts" label of packaged foods and drinks. The amount of calories, carbs, fats, and other nutrients listed on the label is based on one serving of the item. Many items contain more than one serving per package.  · Check the total grams (g) of carbs in one serving. You can calculate the number of servings of carbs in one serving by dividing the total carbs by 15. For example, if a food has 30 g of total carbs, it would be equal to 2   servings of carbs.  · Check the number of grams (g) of saturated and trans fats in one serving. Choose foods that have low or no amount of these fats.  · Check the number of milligrams (mg) of salt (sodium) in one serving. Most people should limit total sodium intake to less than 2,300 mg per day.  · Always check the nutrition information of foods labeled as "low-fat" or "nonfat". These foods may be higher in added sugar or refined carbs and should be avoided.  · Talk to your dietitian to identify your daily goals for nutrients listed on the label.  Shopping  · Avoid buying canned, premade, or processed foods. These foods tend to be high in fat, sodium,  and added sugar.  · Shop around the outside edge of the grocery store. This includes fresh fruits and vegetables, bulk grains, fresh meats, and fresh dairy.  Cooking  · Use low-heat cooking methods, such as baking, instead of high-heat cooking methods like deep frying.  · Cook using healthy oils, such as olive, canola, or sunflower oil.  · Avoid cooking with butter, cream, or high-fat meats.  Meal planning  · Eat meals and snacks regularly, preferably at the same times every day. Avoid going long periods of time without eating.  · Eat foods high in fiber, such as fresh fruits, vegetables, beans, and whole grains. Talk to your dietitian about how many servings of carbs you can eat at each meal.  · Eat 4-6 ounces (oz) of lean protein each day, such as lean meat, chicken, fish, eggs, or tofu. One oz of lean protein is equal to:  ? 1 oz of meat, chicken, or fish.  ? 1 egg.  ? ¼ cup of tofu.  · Eat some foods each day that contain healthy fats, such as avocado, nuts, seeds, and fish.  Lifestyle  · Check your blood glucose regularly.  · Exercise regularly as told by your health care provider. This may include:  ? 150 minutes of moderate-intensity or vigorous-intensity exercise each week. This could be brisk walking, biking, or water aerobics.  ? Stretching and doing strength exercises, such as yoga or weightlifting, at least 2 times a week.  · Take medicines as told by your health care provider.  · Do not use any products that contain nicotine or tobacco, such as cigarettes and e-cigarettes. If you need help quitting, ask your health care provider.  · Work with a counselor or diabetes educator to identify strategies to manage stress and any emotional and social challenges.  Questions to ask a health care provider  · Do I need to meet with a diabetes educator?  · Do I need to meet with a dietitian?  · What number can I call if I have questions?  · When are the best times to check my blood glucose?  Where to find more  information:  · American Diabetes Association: diabetes.org  · Academy of Nutrition and Dietetics: www.eatright.org  · National Institute of Diabetes and Digestive and Kidney Diseases (NIH): www.niddk.nih.gov  Summary  · A healthy meal plan will help you control your blood glucose and maintain a healthy lifestyle.  · Working with a diet and nutrition specialist (dietitian) can help you make a meal plan that is best for you.  · Keep in mind that carbohydrates (carbs) and alcohol have immediate effects on your blood glucose levels. It is important to count carbs and to use alcohol carefully.  This information is not intended to   replace advice given to you by your health care provider. Make sure you discuss any questions you have with your health care provider.  Document Released: 09/27/2004 Document Revised: 07/31/2016 Document Reviewed: 02/05/2016  Elsevier Interactive Patient Education © 2019 [employer]

## 2018-06-01 NOTE — Progress Notes (Signed)
Subjective:    Patient ID: Anna Salinas, female    DOB: December 18, 1970, 48 y.o.   MRN: [id number]   Chief Complaint: Medical Management of Chronic Issues    HPI:  1. Essential hypertension, benign No c/o chest pain, sob or headache. Does not check blood pressure at home. BP Readings from Last 3 Encounters:  06/01/18 (!) 143/85  03/31/18 138/83  03/05/18 140/78     2. Diabetes mellitus type II, non insulin dependent ([place]) Last hgab1c was 8.6%. she has not been watching diet and no exercise. Her blood sugars are running from 120-250. No hypoglycemia.  3. Long term current use of anticoagulant therapy INR checked today- see INR documentation  4. Thrombocytopenia (HCC) CBC normal at last check  5. Symptomatic anemia Last HGB was 12.7   6. BMI 34.0-34.9,adult No recent weight changes    Outpatient Encounter Medications as of 06/01/2018  Medication Sig  . metFORMIN (GLUCOPHAGE-XR) 500 MG 24 hr tablet Take 1 tablet (500 mg total) by mouth 2 (two) times daily.  . ranitidine (ZANTAC) 150 MG tablet Take 1 tablet (150 mg total) by mouth daily.  . rosuvastatin (CRESTOR) 10 MG tablet Take 1 tablet (10 mg total) by mouth daily.  Marland Kitchen warfarin (COUMADIN) 5 MG tablet Take 0.5-1 tablets (2.5-5 mg total) by mouth See admin instructions. Take 2.5 mg by mouth daily except take 5 mg by mouth on Monday, Wednesday and Friday      New complaints: None today  Social history: Is currently out of work due to fall at work in which she broke her elbow.     Review of Systems  Constitutional: Negative for activity change and appetite change.  HENT: Negative.   Eyes: Negative for pain.  Respiratory: Negative for shortness of breath.   Cardiovascular: Negative for chest pain, palpitations and leg swelling.  Gastrointestinal: Negative for abdominal pain.  Endocrine: Negative for polydipsia.  Genitourinary: Negative.   Skin: Negative for rash.  Neurological: Negative for dizziness,  weakness and headaches.  Hematological: Does not bruise/bleed easily.  Psychiatric/Behavioral: Negative.   All other systems reviewed and are negative.      Objective:   Physical Exam Vitals signs and nursing note reviewed.  Constitutional:      General: She is not in acute distress.    Appearance: Normal appearance. She is well-developed.  HENT:     Head: Normocephalic.     Nose: Nose normal.  Eyes:     Pupils: Pupils are equal, round, and reactive to light.  Neck:     Musculoskeletal: Normal range of motion and neck supple.     Vascular: No carotid bruit or JVD.  Cardiovascular:     Rate and Rhythm: Normal rate and regular rhythm.     Heart sounds: Normal heart sounds.  Pulmonary:     Effort: Pulmonary effort is normal. No respiratory distress.     Breath sounds: Normal breath sounds. No wheezing or rales.  Chest:     Chest wall: No tenderness.  Abdominal:     General: Bowel sounds are normal. There is no distension or abdominal bruit.     Palpations: Abdomen is soft. There is no hepatomegaly, splenomegaly, mass or pulsatile mass.     Tenderness: There is no abdominal tenderness.  Musculoskeletal: Normal range of motion.     Comments: Left arm in sling  Lymphadenopathy:     Cervical: No cervical adenopathy.  Skin:    General: Skin is warm and dry.  Comments: Contusion of right  Lower leg with edema of foot.  Neurological:     Mental Status: She is alert and oriented to person, place, and time.     Deep Tendon Reflexes: Reflexes are normal and symmetric.  Psychiatric:        Behavior: Behavior normal.        Thought Content: Thought content normal.        Judgment: Judgment normal.    BP (!) 143/85   Pulse 92   Temp 97.9 F (36.6 C) (Oral)   Ht '5\' 8"'$  (1.727 m)   Wt 233 lb (105.7 kg)   LMP 04/14/2017   BMI 35.43 kg/m   INR 3.1  hgba1c 10.4%      Assessment & Plan:  Anna Salinas comes in today with chief complaint of Medical Management of  Chronic Issues   Diagnosis and orders addressed:  1. Essential hypertension, benign Low sodium diet - CMP14+EGFR - rosuvastatin (CRESTOR) 10 MG tablet; Take 1 tablet (10 mg total) by mouth daily.  Dispense: 90 tablet; Refill: 3  2. Diabetes mellitus type II, non insulin dependent ([place]) Stricter carb counting - Bayer DCA Hb A1c Waived - Lipid panel - Microalbumin / creatinine urine ratio Increase trsiba to 65u for 3 day sthen to 70u daily Keep diary of blood sugars - metFORMIN (GLUCOPHAGE-XR) 500 MG 24 hr tablet; Take 1 tablet (500 mg total) by mouth 2 (two) times daily.  Dispense: 180 tablet; Refill: 1 - Insulin Degludec (TRESIBA FLEXTOUCH) 200 UNIT/ML SOPN; Inject 70 Units into the skin daily.  Dispense: 5 pen; Refill: 5  3. Long term current use of anticoagulant therapy - CoaguChek XS/INR Waived - warfarin (COUMADIN) 5 MG tablet; Take 0.5-1 tablets (2.5-5 mg total) by mouth See admin instructions. Take 2.5 mg by mouth daily except take 5 mg by mouth on Monday, Wednesday and Friday  Dispense: 30 tablet; Refill: 5  4. Thrombocytopenia ([place]) Labs pending  5. Symptomatic anemia  6. BMI 34.0-34.9,adult Discussed diet and exercise for person with BMI >25 Will recheck weight in 3-6 months   Labs pending Health Maintenance reviewed Diet and exercise encouraged  Follow up plan: 1 month   [place], FNP

## 2018-06-02 LAB — CMP14+EGFR
ALT: 24 IU/L (ref 0–32)
AST: 33 IU/L (ref 0–40)
Albumin/Globulin Ratio: 1.5 (ref 1.2–2.2)
Albumin: 4.2 g/dL (ref 3.8–4.8)
Alkaline Phosphatase: 66 IU/L (ref 39–117)
BUN/Creatinine Ratio: 18 (ref 9–23)
BUN: 13 mg/dL (ref 6–24)
Bilirubin Total: 0.6 mg/dL (ref 0.0–1.2)
CO2: 21 mmol/L (ref 20–29)
Calcium: 9.5 mg/dL (ref 8.7–10.2)
Chloride: 100 mmol/L (ref 96–106)
Creatinine, Ser: 0.73 mg/dL (ref 0.57–1.00)
GFR calc Af Amer: 115 mL/min/{1.73_m2} (ref >59)
GFR calc non Af Amer: 100 mL/min/{1.73_m2} (ref >59)
Globulin, Total: 2.8 g/dL (ref 1.5–4.5)
Glucose: 308 mg/dL — ABNORMAL HIGH (ref 65–99)
Potassium: 4 mmol/L (ref 3.5–5.2)
Sodium: 135 mmol/L (ref 134–144)
Total Protein: 7 g/dL (ref 6.0–8.5)

## 2018-06-02 LAB — MICROALBUMIN / CREATININE URINE RATIO
Creatinine, Urine: 140.1 mg/dL
Microalb/Creat Ratio: 7 mg/g creat (ref 0–29)
Microalbumin, Urine: 10.4 ug/mL

## 2018-06-02 LAB — LIPID PANEL
Chol/HDL Ratio: 3.2 ratio (ref 0.0–4.4)
Cholesterol, Total: 102 mg/dL (ref 100–199)
HDL: 32 mg/dL — ABNORMAL LOW (ref >39)
LDL Calculated: 47 mg/dL (ref 0–99)
Triglycerides: 117 mg/dL (ref 0–149)
VLDL Cholesterol Cal: 23 mg/dL (ref 5–40)

## 2018-06-11 ENCOUNTER — Telehealth: Payer: Self-pay | Admitting: Cardiology

## 2018-06-11 NOTE — Telephone Encounter (Signed)
LVM for pt to call back regarding video or phone visit.

## 2018-06-16 ENCOUNTER — Ambulatory Visit: Payer: BLUE CROSS/BLUE SHIELD | Admitting: Cardiology

## 2018-07-06 ENCOUNTER — Telehealth: Payer: Self-pay | Admitting: Cardiology

## 2018-07-06 NOTE — Telephone Encounter (Signed)
Phone visit/call [phone]/[facility] chart/pre reg complete/consent obtained -- ttf

## 2018-07-10 ENCOUNTER — Ambulatory Visit: Payer: Self-pay | Admitting: Nurse Practitioner

## 2018-07-13 ENCOUNTER — Telehealth: Payer: Self-pay

## 2018-07-13 ENCOUNTER — Telehealth (INDEPENDENT_AMBULATORY_CARE_PROVIDER_SITE_OTHER): Payer: BC Managed Care – PPO | Admitting: Cardiology

## 2018-07-13 ENCOUNTER — Encounter: Payer: Self-pay | Admitting: Cardiology

## 2018-07-13 VITALS — BP 135/80 | HR 84 | Ht 69.0 in | Wt 225.0 lb

## 2018-07-13 DIAGNOSIS — E119 Type 2 diabetes mellitus without complications: Secondary | ICD-10-CM

## 2018-07-13 DIAGNOSIS — Z952 Presence of prosthetic heart valve: Secondary | ICD-10-CM | POA: Diagnosis not present

## 2018-07-13 DIAGNOSIS — D649 Anemia, unspecified: Secondary | ICD-10-CM

## 2018-07-13 DIAGNOSIS — IMO0002 Reserved for concepts with insufficient information to code with codable children: Secondary | ICD-10-CM | POA: Insufficient documentation

## 2018-07-13 DIAGNOSIS — I10 Essential (primary) hypertension: Secondary | ICD-10-CM

## 2018-07-13 DIAGNOSIS — Z7901 Long term (current) use of anticoagulants: Secondary | ICD-10-CM

## 2018-07-13 DIAGNOSIS — M329 Systemic lupus erythematosus, unspecified: Secondary | ICD-10-CM | POA: Insufficient documentation

## 2018-07-13 DIAGNOSIS — D696 Thrombocytopenia, unspecified: Secondary | ICD-10-CM

## 2018-07-13 NOTE — Telephone Encounter (Signed)
Contacted patient to discuss AVS Instructions. Gave patient Anna Salinas's recommendations from today's virtual office visit. Informed patient that someone from the scheduling dept will be in contact with her to schedule her follow up appt. Patient voiced understanding and AVS mailed.    

## 2018-07-13 NOTE — Progress Notes (Signed)
Virtual Visit via Telephone Note   This visit type was conducted due to national recommendations for restrictions regarding the COVID-19 Pandemic (e.g. social distancing) in an effort to limit this patient's exposure and mitigate transmission in our community.  Due to her co-morbid illnesses, this patient is at least at moderate risk for complications without adequate follow up.  This format is felt to be most appropriate for this patient at this time.  The patient did not have access to video technology/had technical difficulties with video requiring transitioning to audio format only (telephone).  All issues noted in this document were discussed and addressed.  No physical exam could be performed with this format.  Please refer to the patient's chart for her  consent to telehealth for [facility].   Date:  07/13/2018   ID:  Anna Salinas, DOB [birth year], MRN [id number]  Patient Location: Home Provider Location: Office  PCP:  Chevis Pretty, FNP  Cardiologist:  Peter Martinique, MD  Electrophysiologist:  None   Evaluation Performed:  Follow-Up Visit  Chief Complaint:  none  History of Present Illness:    Anna Salinas is a 48 y.o. female with a history of mechanical AVR in 2016 secondary to bicuspid aortic valve and aortic stenosis.  This was complicated by postop keratotomy syndrome with pleural effusion that required tap.  Eventually this all cleared.  Other medical problems include insulin-dependent diabetes, symptomatic anemia from dysfunctional uterine bleeding, status post hysterectomy November 2019, and mild thrombocytopenia with a recent work-up at the cancer center in [place] which indicated positive ANA with a speckled pattern consistent with lupus although she has no signs or symptoms.  She was contacted today for 63-month follow-up.  Since Dr. Martinique saw her last she has been doing well from a cardiac standpoint.  She did have a fall sometime back and had a  fracture to her left elbow.  She is being followed by a Psychologist, sport and exercise at [facility].  She may need surgery for this.  Her PCP follows her Coumadin.  In the past she tells me her Coumadin was just stopped and then resumed with no crossover.  I have asked her to request the surgeon send [place] a preoperative clearance when her surgery is scheduled.  The patient does not have symptoms concerning for COVID-19 infection (fever, chills, cough, or new shortness of breath).    Past Medical History:  Diagnosis Date  . Aortic stenosis due to bicuspid aortic valve   . Blood transfusion without reported diagnosis   . Essential hypertension, benign   . Palpitations   . S/P laparoscopic hysterectomy 11/26/2017  . Type 2 diabetes mellitus ([place])    Past Surgical History:  Procedure Laterality Date  . AORTIC VALVE REPLACEMENT N/A 02/14/2014   Procedure: AORTIC VALVE REPLACEMENT (AVR);  Surgeon: Gaye Pollack, MD;  Location: [place];  Service: Open Heart Surgery;  Laterality: N/A;  . CARDIAC CATHETERIZATION    . CESAREAN SECTION  2009  . CHEST TUBE INSERTION Right 03/27/2014   Procedure: CHEST TUBE INSERTION;  Surgeon: Grace Isaac, MD;  Location: [place];  Service: Thoracic;  Laterality: Right;  . CYSTOSCOPY N/A 11/26/2017   Procedure: CYSTOSCOPY;  Surgeon: Sherlyn Hay, DO;  Location: [facility];  Service: Gynecology;  Laterality: N/A;  . INTRAOPERATIVE TRANSESOPHAGEAL ECHOCARDIOGRAM N/A 02/14/2014   Procedure: INTRAOPERATIVE TRANSESOPHAGEAL ECHOCARDIOGRAM;  Surgeon: Gaye Pollack, MD;  Location: [facility];  Service: Open Heart Surgery;  Laterality: N/A;  . LEFT AND RIGHT HEART CATHETERIZATION  WITH CORONARY ANGIOGRAM N/A 01/25/2014   Procedure: LEFT AND RIGHT HEART CATHETERIZATION WITH CORONARY ANGIOGRAM;  Surgeon: Peter M Martinique, MD;  Location: [facility] CATH LAB;  Service: Cardiovascular;  Laterality: N/A;  . PERICARDIAL FLUID DRAINAGE N/A 03/27/2014   Procedure: DRAINAGE OF PERICARDIAL FLUID;  Surgeon: Grace Isaac, MD;   Location: [place];  Service: Thoracic;  Laterality: N/A;  . PLEURAL EFFUSION DRAINAGE Right 03/27/2014   Procedure: DRAINAGE OF PLEURAL EFFUSION;  Surgeon: Grace Isaac, MD;  Location: [place];  Service: Thoracic;  Laterality: Right;  Drainage of right pleural effusion  . SUBXYPHOID PERICARDIAL WINDOW N/A 03/27/2014   Procedure: SUBXYPHOID PERICARDIAL WINDOW;  Surgeon: Grace Isaac, MD;  Location: [place];  Service: Thoracic;  Laterality: N/A;  . TOTAL LAPAROSCOPIC HYSTERECTOMY WITH SALPINGECTOMY Bilateral 11/26/2017   Procedure: TOTAL LAPAROSCOPIC HYSTERECTOMY WITH BILATERAL SALPINGECTOMY LEFT OOPHERECTOMY;  Surgeon: Sherlyn Hay, DO;  Location: [facility];  Service: Gynecology;  Laterality: Bilateral;  . TUBAL LIGATION       Current Meds  Medication Sig  . Insulin Degludec (TRESIBA FLEXTOUCH) 200 UNIT/ML SOPN Inject 70 Units into the skin daily.  . metFORMIN (GLUCOPHAGE-XR) 500 MG 24 hr tablet Take 1 tablet (500 mg total) by mouth 2 (two) times daily.  . ranitidine (ZANTAC) 150 MG tablet Take 1 tablet (150 mg total) by mouth daily.  . rosuvastatin (CRESTOR) 10 MG tablet Take 1 tablet (10 mg total) by mouth daily.  Marland Kitchen warfarin (COUMADIN) 5 MG tablet Take 0.5-1 tablets (2.5-5 mg total) by mouth See admin instructions. Take 2.5 mg by mouth daily except take 5 mg by mouth on Monday, Wednesday and Friday     Allergies:   Glipizide, Metoprolol, and Shellfish allergy   Social History   Tobacco Use  . Smoking status: Never Smoker  . Smokeless tobacco: Never Used  Substance Use Topics  . Alcohol use: No    Alcohol/week: 0.0 standard drinks  . Drug use: No     Family Hx: The patient's family history includes Brain cancer in her brother; Colon cancer in her maternal grandmother; Crohn's disease in her brother; Diabetes in her mother; Heart disease in her mother.  ROS:   Please see the history of present illness.    All other systems reviewed and are negative.   Prior CV studies:    The following studies were reviewed today:  Labs/Other Tests and Data Reviewed:    EKG:  No ECG reviewed.  Recent Labs: 03/05/2018: Hemoglobin 12.7; Platelets 155 06/01/2018: ALT 24; BUN 13; Creatinine, Ser 0.73; Potassium 4.0; Sodium 135   Recent Lipid Panel Lab Results  Component Value Date/Time   CHOL 102 06/01/2018 11:12 AM   TRIG 117 06/01/2018 11:12 AM   TRIG 198 (H) 04/14/2014 02:56 PM   HDL 32 (L) 06/01/2018 11:12 AM   HDL 25 (L) 04/14/2014 02:56 PM   CHOLHDL 3.2 06/01/2018 11:12 AM   CHOLHDL 8.6 04/12/2014 09:15 AM   LDLCALC 47 06/01/2018 11:12 AM    Wt Readings from Last 3 Encounters:  07/13/18 225 lb (102.1 kg)  06/01/18 233 lb (105.7 kg)  03/31/18 233 lb 8 oz (105.9 kg)     Objective:    Vital Signs:  BP 135/80   Pulse 84   Ht 5\' 9"  (1.753 m)   Wt 225 lb (102.1 kg)   LMP 04/14/2017   BMI 33.23 kg/m    VITAL SIGNS:  reviewed  ASSESSMENT & PLAN:    AVR- Mechanical AVR 2016. Complicated  by post pericardotomy syndrome with pleural effusion  Chronic anticoagulation- Coumadin PCP follows  Lupus- Recent diagnosis. No symptoms  Anemia- S/p hysterectomy in Nov 2019 with resolution of anemia  IDDM- Per PCP  Fx Lt elbow- She is followed at [facility].  She may require surgery.   COVID-19 Education: The signs and symptoms of COVID-19 were discussed with the patient and how to seek care for testing (follow up with PCP or arrange E-visit).  The importance of social distancing was discussed today.  Time:   Today, I have spent 10 minutes with the patient with telehealth technology discussing the above problems.     Medication Adjustments/Labs and Tests Ordered: Current medicines are reviewed at length with the patient today.  Concerns regarding medicines are outlined above.   Tests Ordered: No orders of the defined types were placed in this encounter.   Medication Changes: No orders of the defined types were placed in this encounter.    Follow Up:  In Person Dr Martinique 6 months  Signed, Kerin Ransom, PA-C  07/13/2018 [facility]

## 2018-07-13 NOTE — Telephone Encounter (Signed)
VERBAL CONSENT GIVEN TO TERESA FIELDS ON 07/06/2018 AT 3:23PM.      Virtual Visit Pre-Appointment Phone Call  "[name], I am calling you today to discuss your upcoming appointment. We are currently trying to limit exposure to the virus that causes COVID-19 by seeing patients at home rather than in the office."  1. "What is the BEST phone number to call the day of the visit?" - include this in appointment notes  2. "Do you have or have access to (through a family member/friend) a smartphone with video capability that we can use for your visit?" a. If yes - list this number in appt notes as "cell" (if different from BEST phone #) and list the appointment type as a VIDEO visit in appointment notes b. If no - list the appointment type as a PHONE visit in appointment notes  3. Confirm consent - "In the setting of the current Covid19 crisis, you are scheduled for a (phone or video) visit with your provider on (date) at (time).  Just as we do with many in-office visits, in order for you to participate in this visit, we must obtain consent.  If you'd like, I can send this to your mychart (if signed up) or email for you to review.  Otherwise, I can obtain your verbal consent now.  All virtual visits are billed to your insurance company just like a normal visit would be.  By agreeing to a virtual visit, we'd like you to understand that the technology does not allow for your provider to perform an examination, and thus may limit your provider's ability to fully assess your condition. If your provider identifies any concerns that need to be evaluated in person, we will make arrangements to do so.  Finally, though the technology is pretty good, we cannot assure that it will always work on either your or our end, and in the setting of a video visit, we may have to convert it to a phone-only visit.  In either situation, we cannot ensure that we have a secure connection.  Are you willing to proceed?" STAFF: Did  the patient verbally acknowledge consent to telehealth visit? Document YES/NO here: YES  4. Advise patient to be prepared - "Two hours prior to your appointment, go ahead and check your blood pressure, pulse, oxygen saturation, and your weight (if you have the equipment to check those) and write them all down. When your visit starts, your provider will ask you for this information. If you have an Apple Watch or Kardia device, please plan to have heart rate information ready on the day of your appointment. Please have a pen and paper handy nearby the day of the visit as well."  5. Give patient instructions for MyChart download to smartphone OR Doximity/Doxy.me as below if video visit (depending on what platform provider is using)  6. Inform patient they will receive a phone call 15 minutes prior to their appointment time (may be from unknown caller ID) so they should be prepared to answer    TELEPHONE CALL NOTE  Anna Salinas has been deemed a candidate for a follow-up tele-health visit to limit community exposure during the Covid-19 pandemic. I spoke with the patient via phone to ensure availability of phone/video source, confirm preferred email & phone number, and discuss instructions and expectations.  I reminded Anna Salinas to be prepared with any vital sign and/or heart rhythm information that could potentially be obtained via home monitoring, at the time of  her visit. I reminded Anna Salinas to expect a phone call prior to her visit.  Harold Hedge, CMA 07/13/2018 2:12 PM   INSTRUCTIONS FOR DOWNLOADING THE MYCHART APP TO SMARTPHONE  - The patient must first make sure to have activated MyChart and know their login information - If Apple, go to [employer] and type in MyChart in the search bar and download the app. If Android, ask patient to go to [employer] and type in [place] in the search bar and download the app. The app is free but as with any other app downloads,  their phone may require them to verify saved payment information or Apple/Android password.  - The patient will need to then log into the app with their MyChart username and password, and select [facility] as their healthcare provider to link the account. When it is time for your visit, go to the MyChart app, find appointments, and click Begin Video Visit. Be sure to Select Allow for your device to access the Microphone and Camera for your visit. You will then be connected, and your provider will be with you shortly.  **If they have any issues connecting, or need assistance please contact MyChart service desk [phone])**  **If using a computer, in order to ensure the best quality for their visit they will need to use either of the following Internet Browsers: [employer], or Google Chrome**  IF USING DOXIMITY or DOXY.ME - The patient will receive a link just prior to their visit by text.     FULL LENGTH CONSENT FOR TELE-HEALTH VISIT   I hereby voluntarily request, consent and authorize [facility] and its employed or contracted physicians, physician assistants, nurse practitioners or other licensed health care professionals (the Practitioner), to provide me with telemedicine health care services (the "Services") as deemed necessary by the treating Practitioner. I acknowledge and consent to receive the Services by the Practitioner via telemedicine. I understand that the telemedicine visit will involve communicating with the Practitioner through live audiovisual communication technology and the disclosure of certain medical information by electronic transmission. I acknowledge that I have been given the opportunity to request an in-person assessment or other available alternative prior to the telemedicine visit and am voluntarily participating in the telemedicine visit.  I understand that I have the right to withhold or withdraw my consent to the use of telemedicine in the  course of my care at any time, without affecting my right to future care or treatment, and that the Practitioner or I may terminate the telemedicine visit at any time. I understand that I have the right to inspect all information obtained and/or recorded in the course of the telemedicine visit and may receive copies of available information for a reasonable fee.  I understand that some of the potential risks of receiving the Services via telemedicine include:  Marland Kitchen Delay or interruption in medical evaluation due to technological equipment failure or disruption; . Information transmitted may not be sufficient (e.g. poor resolution of images) to allow for appropriate medical decision making by the Practitioner; and/or  . In rare instances, security protocols could fail, causing a breach of personal health information.  Furthermore, I acknowledge that it is my responsibility to provide information about my medical history, conditions and care that is complete and accurate to the best of my ability. I acknowledge that Practitioner's advice, recommendations, and/or decision may be based on factors not within their control, such as incomplete or inaccurate data provided by  me or distortions of diagnostic images or specimens that may result from electronic transmissions. I understand that the practice of medicine is not an exact science and that Practitioner makes no warranties or guarantees regarding treatment outcomes. I acknowledge that I will receive a copy of this consent concurrently upon execution via email to the email address I last provided but may also request a printed copy by calling the office of [facility].    I understand that my insurance will be billed for this visit.   I have read or had this consent read to me. . I understand the contents of this consent, which adequately explains the benefits and risks of the Services being provided via telemedicine.  . I have been provided ample  opportunity to ask questions regarding this consent and the Services and have had my questions answered to my satisfaction. . I give my informed consent for the services to be provided through the use of telemedicine in my medical care  By participating in this telemedicine visit I agree to the above.

## 2018-07-13 NOTE — Patient Instructions (Signed)
Medication Instructions:  Your physician recommends that you continue on your current medications as directed. Please refer to the Current Medication list given to you today.  If you need a refill on your cardiac medications before your next appointment, please call your pharmacy.   Lab work: None  If you have labs (blood work) drawn today and your tests are completely normal, you will receive your results only by: . MyChart Message (if you have MyChart) OR . A paper copy in the mail If you have any lab test that is abnormal or we need to change your treatment, we will call you to review the results.  Testing/Procedures: None   Follow-Up: At [facility], you and your health needs are our priority.  As part of our continuing mission to provide you with exceptional heart care, we have created designated Provider Care Teams.  These Care Teams include your primary Cardiologist (physician) and Advanced Practice Providers (APPs -  Physician Assistants and Nurse Practitioners) who all work together to provide you with the care you need, when you need it. You will need a follow up appointment in 6 months.  Please call our office 2 months in advance to schedule this appointment.  You may see Peter Jordan, MD or one of the following Advanced Practice Providers on your designated Care Team: Hao Meng, PA-C . Angela Duke, PA-C  Any Other Special Instructions Will Be Listed Below (If Applicable).   

## 2018-07-23 ENCOUNTER — Other Ambulatory Visit: Payer: Self-pay

## 2018-07-24 ENCOUNTER — Encounter: Payer: Self-pay | Admitting: Nurse Practitioner

## 2018-07-24 ENCOUNTER — Ambulatory Visit: Payer: BC Managed Care – PPO | Admitting: Nurse Practitioner

## 2018-07-24 VITALS — BP 124/68 | HR 80 | Temp 98.0°F | Ht 69.0 in | Wt 239.0 lb

## 2018-07-24 DIAGNOSIS — Z7901 Long term (current) use of anticoagulants: Secondary | ICD-10-CM

## 2018-07-24 DIAGNOSIS — E119 Type 2 diabetes mellitus without complications: Secondary | ICD-10-CM

## 2018-07-24 DIAGNOSIS — I10 Essential (primary) hypertension: Secondary | ICD-10-CM

## 2018-07-24 DIAGNOSIS — Z6834 Body mass index (BMI) 34.0-34.9, adult: Secondary | ICD-10-CM

## 2018-07-24 DIAGNOSIS — Z952 Presence of prosthetic heart valve: Secondary | ICD-10-CM

## 2018-07-24 DIAGNOSIS — D696 Thrombocytopenia, unspecified: Secondary | ICD-10-CM

## 2018-07-24 DIAGNOSIS — Z794 Long term (current) use of insulin: Secondary | ICD-10-CM

## 2018-07-24 LAB — COAGUCHEK XS/INR WAIVED
INR: 4 — ABNORMAL HIGH (ref 0.9–1.1)
Prothrombin Time: 47.7 s

## 2018-07-24 LAB — BAYER DCA HB A1C WAIVED: HB A1C (BAYER DCA - WAIVED): 9.3 % — ABNORMAL HIGH (ref <7.0)

## 2018-07-24 MED ORDER — TRESIBA FLEXTOUCH 200 UNIT/ML ~~LOC~~ SOPN: 75.0000 [IU] | PEN_INJECTOR | Freq: Every day | SUBCUTANEOUS | 5 refills | Status: DC

## 2018-07-24 NOTE — Patient Instructions (Signed)

## 2018-07-24 NOTE — Progress Notes (Signed)
Subjective:    Patient ID: CHANDRIA ROOKSTOOL, female    DOB: [birth year], 48 y.o.   MRN: [id number]   Chief Complaint: medical management of chronic issues   HPI:  1. Essential hypertension, benign No c/o chest pain, sob or headache. Doe snot check blood pressure at home. BP Readings from Last 3 Encounters:  07/13/18 135/80  06/01/18 (!) 143/85  03/31/18 138/83     2. BMI 34.0-34.9,adult No recent weight chnages  3. Thrombocytopenia (HCC) Last platelet count was 155. She did see hematology at one time but they release to only return if she was having problems  4. S/P AVR (aortic valve replacement) She had aortic valve replaced last year. She had telephone visit with cardiology on 07/04/18. According to chart, no changes were made to plan of care.  5. Long term current use of anticoagulant therapy She has been on coumadin since her valve replacement. Her last INR was 3.1   Indication: valve replacement Bleeding signs/symptoms: None Thromboembolic signs/symptoms: None  Missed Coumadin doses: None Medication changes: no Dietary changes: no Bacterial/viral infection: no Other concerns: no  6. Insulin dependent diabetic Her last hgba1c was 10.4. we increased her tresiba to 70u daily. Since then her blood sugars have been running around. Blood sugars are running around 130-140. Highest has been 175.     Outpatient Encounter Medications as of 07/24/2018  Medication Sig  . Insulin Degludec (TRESIBA FLEXTOUCH) 200 UNIT/ML SOPN Inject 70 Units into the skin daily.  . metFORMIN (GLUCOPHAGE-XR) 500 MG 24 hr tablet Take 1 tablet (500 mg total) by mouth 2 (two) times daily.  . ranitidine (ZANTAC) 150 MG tablet Take 1 tablet (150 mg total) by mouth daily.  . rosuvastatin (CRESTOR) 10 MG tablet Take 1 tablet (10 mg total) by mouth daily.  Marland Kitchen warfarin (COUMADIN) 5 MG tablet Take 0.5-1 tablets (2.5-5 mg total) by mouth See admin instructions. Take 2.5 mg by mouth daily except take 5 mg  by mouth on Monday, Wednesday and Friday     Past Surgical History:  Procedure Laterality Date  . AORTIC VALVE REPLACEMENT N/A 02/14/2014   Procedure: AORTIC VALVE REPLACEMENT (AVR);  Surgeon: Gaye Pollack, MD;  Location: [place];  Service: Open Heart Surgery;  Laterality: N/A;  . CARDIAC CATHETERIZATION    . CESAREAN SECTION  2009  . CHEST TUBE INSERTION Right 03/27/2014   Procedure: CHEST TUBE INSERTION;  Surgeon: Grace Isaac, MD;  Location: [place];  Service: Thoracic;  Laterality: Right;  . CYSTOSCOPY N/A 11/26/2017   Procedure: CYSTOSCOPY;  Surgeon: Sherlyn Hay, DO;  Location: [facility];  Service: Gynecology;  Laterality: N/A;  . INTRAOPERATIVE TRANSESOPHAGEAL ECHOCARDIOGRAM N/A 02/14/2014   Procedure: INTRAOPERATIVE TRANSESOPHAGEAL ECHOCARDIOGRAM;  Surgeon: Gaye Pollack, MD;  Location: [place] [facility];  Service: Open Heart Surgery;  Laterality: N/A;  . LEFT AND RIGHT HEART CATHETERIZATION WITH CORONARY ANGIOGRAM N/A 01/25/2014   Procedure: LEFT AND RIGHT HEART CATHETERIZATION WITH CORONARY ANGIOGRAM;  Surgeon: Peter M Martinique, MD;  [facility];  Service: Cardiovascular;  Laterality: N/A;  . PERICARDIAL FLUID DRAINAGE N/A 03/27/2014   Procedure: DRAINAGE OF PERICARDIAL FLUID;  Surgeon: Grace Isaac, MD;  Location: [place];  Service: Thoracic;  Laterality: N/A;  . PLEURAL EFFUSION DRAINAGE Right 03/27/2014   Procedure: DRAINAGE OF PLEURAL EFFUSION;  Surgeon: Grace Isaac, MD;  Location: [place];  Service: Thoracic;  Laterality: Right;  Drainage of right pleural effusion  . SUBXYPHOID PERICARDIAL WINDOW N/A 03/27/2014  Procedure: SUBXYPHOID PERICARDIAL WINDOW;  Surgeon: Grace Isaac, MD;  Location: [place];  Service: Thoracic;  Laterality: N/A;  . TOTAL LAPAROSCOPIC HYSTERECTOMY WITH SALPINGECTOMY Bilateral 11/26/2017   Procedure: TOTAL LAPAROSCOPIC HYSTERECTOMY WITH BILATERAL SALPINGECTOMY LEFT OOPHERECTOMY;  Surgeon: Sherlyn Hay, DO;  Location: [facility];  Service:  Gynecology;  Laterality: Bilateral;  . TUBAL LIGATION      Family History  Problem Relation Age of Onset  . Diabetes Mother   . Heart disease Mother   . Colon cancer Maternal Grandmother   . Brain cancer Brother   . Crohn's disease Brother     New complaints: None today  Social history: lives with husband and son  Controlled substance contract: N/A    Review of Systems  Constitutional: Negative for activity change and appetite change.  HENT: Negative.   Eyes: Negative for pain.  Respiratory: Negative for shortness of breath.   Cardiovascular: Negative for chest pain, palpitations and leg swelling.  Gastrointestinal: Negative for abdominal pain.  Endocrine: Negative for polydipsia.  Genitourinary: Negative.   Skin: Negative for rash.  Neurological: Negative for dizziness, weakness and headaches.  Hematological: Does not bruise/bleed easily.  Psychiatric/Behavioral: Negative.   All other systems reviewed and are negative.      Objective:   Physical Exam Vitals signs and nursing note reviewed.  Constitutional:      General: She is not in acute distress.    Appearance: Normal appearance. She is well-developed.  HENT:     Head: Normocephalic.     Nose: Nose normal.  Eyes:     Pupils: Pupils are equal, round, and reactive to light.  Neck:     Musculoskeletal: Normal range of motion and neck supple.     Vascular: No carotid bruit or JVD.  Cardiovascular:     Rate and Rhythm: Normal rate and regular rhythm.     Heart sounds: Normal heart sounds.  Pulmonary:     Effort: Pulmonary effort is normal. No respiratory distress.     Breath sounds: Normal breath sounds. No wheezing or rales.  Chest:     Chest wall: No tenderness.  Abdominal:     General: Bowel sounds are normal. There is no distension or abdominal bruit.     Palpations: Abdomen is soft. There is no hepatomegaly, splenomegaly, mass or pulsatile mass.     Tenderness: There is no abdominal tenderness.   Musculoskeletal: Normal range of motion.  Lymphadenopathy:     Cervical: No cervical adenopathy.  Skin:    General: Skin is warm and dry.  Neurological:     Mental Status: She is alert and oriented to person, place, and time.     Deep Tendon Reflexes: Reflexes are normal and symmetric.  Psychiatric:        Behavior: Behavior normal.        Thought Content: Thought content normal.        Judgment: Judgment normal.     BP 124/68   Pulse 80   Temp 98 F (36.7 C) (Oral)   Ht 5\' 9"  (1.753 m)   Wt 239 lb (108.4 kg)   LMP 04/14/2017   BMI 35.29 kg/m    hgba1c 9.3  INR 4.1 Assessment & Plan:  BELLA BRUMMET comes in today with chief complaint of Recheck diabetes and protime   Diagnosis and orders addressed:  1. Essential hypertension, benign Low sodium diet  2. BMI 34.0-34.9,adult Discussed diet and exercise for person with BMI >25 Will recheck weight in  3-6 months  3. Thrombocytopenia ([place])  4. S/P AVR (aortic valve replacement)  5. Long term current use of anticoagulant therapy See anticoag documentation - CoaguChek XS/INR Waived Recheck in 2 weeks  6. Diabetes mellitus type II,  insulin dependent (HCC) Stricter carb counting Increased tresiba to 76u daily - Bayer DCA Hb A1c Waived - Insulin Degludec (TRESIBA FLEXTOUCH) 200 UNIT/ML SOPN; Inject 76 Units into the skin daily.  Dispense: 5 pen; Refill: 5    Labs pending Health Maintenance reviewed Diet and exercise encouraged  Follow up plan: 3 months   Mary-Margaret Hassell Done, FNP

## 2018-08-05 ENCOUNTER — Telehealth: Payer: Self-pay

## 2018-08-05 NOTE — Telephone Encounter (Signed)
   [facility] Pre-operative Risk Assessment    Request for surgical clearance:  1. What type of surgery is being performed? Left Elbow   2. When is this surgery scheduled?  TBD   3. What type of clearance is required (medical clearance vs. Pharmacy clearance to hold med vs. Both)? Both  4. Are there any medications that need to be held prior to surgery and how long? Coumadin   5. Practice name and name of physician performing surgery? [facility]   6. What is your office phone number [phone]    7.   What is your office fax number [phone]  8.   Anesthesia type (None, local, MAC, general) ? General   Meryl Crutch 08/05/2018, 4:19 PM  _________________________________________________________________   (provider comments below)

## 2018-08-06 DIAGNOSIS — S42452D Displaced fracture of lateral condyle of left humerus, subsequent encounter for fracture with routine healing: Secondary | ICD-10-CM | POA: Diagnosis not present

## 2018-08-06 DIAGNOSIS — Z7901 Long term (current) use of anticoagulants: Secondary | ICD-10-CM | POA: Diagnosis not present

## 2018-08-06 DIAGNOSIS — Z01818 Encounter for other preprocedural examination: Secondary | ICD-10-CM | POA: Diagnosis not present

## 2018-08-06 DIAGNOSIS — X58XXXD Exposure to other specified factors, subsequent encounter: Secondary | ICD-10-CM | POA: Diagnosis not present

## 2018-08-06 NOTE — Telephone Encounter (Signed)
Patient with diagnosis of mechanical aortic valve replacement on warfarin for anticoagulation.    Procedure: left elbow arthrotomy  Date of procedure: TBD  Per office protocol, patient can hold warfarin for 5 days prior to procedure.    Patient willl NOT need bridging with Lovenox (enoxaparin) around procedure.

## 2018-08-06 NOTE — Telephone Encounter (Signed)
   Primary Cardiologist: Peter Martinique, MD  Chart reviewed as part of pre-operative protocol coverage. Patient was contacted 08/06/2018 in reference to pre-operative risk assessment for pending surgery as outlined below.  Anna Salinas was last seen on 07/13/2018 by Kerin Ransom, PA   Anna Salinas was doing well at that time from a cardiac perspective.   Per pharmacy: Pt has a dx of aortic valve replacement on warfarin for anticoagulation.   Procedure: left elbow arthrotomy Date: TBD  Per office and pharmacy protocol, patient can hold warfarin for 5 days prior to procedure   Pt will NOT need bridging with Lovenox around procedure.   Therefore, based on ACC/AHA guidelines, the patient would be at acceptable risk for the planned procedure without further cardiovascular testing.   I will route this recommendation to the requesting party via Epic fax function and remove from pre-op pool.  Please call with questions.  Kathyrn Drown, NP 08/06/2018, 8:03 AM

## 2018-08-10 ENCOUNTER — Ambulatory Visit: Payer: Self-pay | Admitting: Nurse Practitioner

## 2018-08-11 DIAGNOSIS — Z1159 Encounter for screening for other viral diseases: Secondary | ICD-10-CM | POA: Diagnosis not present

## 2018-08-14 DIAGNOSIS — M24022 Loose body in left elbow: Secondary | ICD-10-CM | POA: Diagnosis not present

## 2018-08-14 DIAGNOSIS — S42452D Displaced fracture of lateral condyle of left humerus, subsequent encounter for fracture with routine healing: Secondary | ICD-10-CM | POA: Diagnosis not present

## 2018-09-17 ENCOUNTER — Ambulatory Visit: Payer: BC Managed Care – PPO | Admitting: Nurse Practitioner

## 2018-09-24 ENCOUNTER — Other Ambulatory Visit: Payer: Self-pay

## 2018-09-24 ENCOUNTER — Inpatient Hospital Stay (HOSPITAL_COMMUNITY): Payer: BC Managed Care – PPO | Attending: Hematology

## 2018-09-24 DIAGNOSIS — Z952 Presence of prosthetic heart valve: Secondary | ICD-10-CM | POA: Diagnosis not present

## 2018-09-24 DIAGNOSIS — Z7901 Long term (current) use of anticoagulants: Secondary | ICD-10-CM | POA: Diagnosis not present

## 2018-09-24 DIAGNOSIS — E639 Nutritional deficiency, unspecified: Secondary | ICD-10-CM | POA: Diagnosis not present

## 2018-09-24 DIAGNOSIS — D696 Thrombocytopenia, unspecified: Secondary | ICD-10-CM | POA: Diagnosis not present

## 2018-09-24 DIAGNOSIS — D509 Iron deficiency anemia, unspecified: Secondary | ICD-10-CM | POA: Diagnosis not present

## 2018-09-24 LAB — CBC WITH DIFFERENTIAL/PLATELET
Abs Immature Granulocytes: 0.01 10*3/uL (ref 0.00–0.07)
Basophils Absolute: 0 10*3/uL (ref 0.0–0.1)
Basophils Relative: 0 %
Eosinophils Absolute: 0.1 10*3/uL (ref 0.0–0.5)
Eosinophils Relative: 2 %
HCT: 39.8 % (ref 36.0–46.0)
Hemoglobin: 12.3 g/dL (ref 12.0–15.0)
Immature Granulocytes: 0 %
Lymphocytes Relative: 26 %
Lymphs Abs: 1.5 10*3/uL (ref 0.7–4.0)
MCH: 26.9 pg (ref 26.0–34.0)
MCHC: 30.9 g/dL (ref 30.0–36.0)
MCV: 87.1 fL (ref 80.0–100.0)
Monocytes Absolute: 0.4 10*3/uL (ref 0.1–1.0)
Monocytes Relative: 6 %
Neutro Abs: 3.8 10*3/uL (ref 1.7–7.7)
Neutrophils Relative %: 66 %
Platelets: 97 10*3/uL — ABNORMAL LOW (ref 150–400)
RBC: 4.57 MIL/uL (ref 3.87–5.11)
RDW: 15.3 % (ref 11.5–15.5)
WBC: 5.8 10*3/uL (ref 4.0–10.5)
nRBC: 0 % (ref 0.0–0.2)

## 2018-09-24 LAB — FERRITIN: Ferritin: 28 ng/mL (ref 11–307)

## 2018-09-24 LAB — IRON AND TIBC
Iron: 34 ug/dL (ref 28–170)
Saturation Ratios: 8 % — ABNORMAL LOW (ref 10.4–31.8)
TIBC: 425 ug/dL (ref 250–450)
UIBC: 391 ug/dL

## 2018-10-01 ENCOUNTER — Other Ambulatory Visit: Payer: Self-pay

## 2018-10-01 ENCOUNTER — Encounter (HOSPITAL_COMMUNITY): Payer: Self-pay | Admitting: Hematology

## 2018-10-01 ENCOUNTER — Inpatient Hospital Stay (HOSPITAL_BASED_OUTPATIENT_CLINIC_OR_DEPARTMENT_OTHER): Payer: BC Managed Care – PPO | Admitting: Hematology

## 2018-10-01 VITALS — BP 141/80 | HR 71 | Temp 96.9°F | Resp 18 | Wt 235.3 lb

## 2018-10-01 DIAGNOSIS — Z7901 Long term (current) use of anticoagulants: Secondary | ICD-10-CM | POA: Diagnosis not present

## 2018-10-01 DIAGNOSIS — D696 Thrombocytopenia, unspecified: Secondary | ICD-10-CM | POA: Diagnosis not present

## 2018-10-01 DIAGNOSIS — Z952 Presence of prosthetic heart valve: Secondary | ICD-10-CM | POA: Diagnosis not present

## 2018-10-01 DIAGNOSIS — E639 Nutritional deficiency, unspecified: Secondary | ICD-10-CM | POA: Diagnosis not present

## 2018-10-01 DIAGNOSIS — D509 Iron deficiency anemia, unspecified: Secondary | ICD-10-CM | POA: Diagnosis not present

## 2018-10-01 NOTE — Progress Notes (Signed)
[place], [place] [id number]   CLINIC:  Medical Oncology/Hematology  PCP:  Chevis Pretty, [address] [place] [address] [phone]   REASON FOR VISIT:  Follow-up for Intermittent Thrombocytopenia and anemia     INTERVAL HISTORY:  Ms. Schuknecht 48 y.o. female seen for follow-up of intermittent thrombocytopenia and microcytic anemia.  Appetite and energy levels are 75%.  Occasional leg swellings have been stable.  No pains reported.  Denies any fevers, night sweats or weight loss in the last 6 months.  Denies any bleeding per rectum, hematuria or melena.  Denies any nosebleeds.  Mild easy bruising is present.  She continues to be on Coumadin for her mechanical aortic valve.   REVIEW OF SYSTEMS:  Review of Systems  Cardiovascular: Positive for leg swelling.  Psychiatric/Behavioral: Positive for sleep disturbance.  All other systems reviewed and are negative.    PAST MEDICAL/SURGICAL HISTORY:  Past Medical History:  Diagnosis Date  . Aortic stenosis due to bicuspid aortic valve   . Blood transfusion without reported diagnosis   . Essential hypertension, benign   . Palpitations   . S/P laparoscopic hysterectomy 11/26/2017  . Type 2 diabetes mellitus ([place])    Past Surgical History:  Procedure Laterality Date  . AORTIC VALVE REPLACEMENT N/A 02/14/2014   Procedure: AORTIC VALVE REPLACEMENT (AVR);  Surgeon: Gaye Pollack, MD;  Location: [place];  Service: Open Heart Surgery;  Laterality: N/A;  . CARDIAC CATHETERIZATION    . CESAREAN SECTION  2009  . CHEST TUBE INSERTION Right 03/27/2014   Procedure: CHEST TUBE INSERTION;  Surgeon: Grace Isaac, MD;  Location: [place];  Service: Thoracic;  Laterality: Right;  . CYSTOSCOPY N/A 11/26/2017   Procedure: CYSTOSCOPY;  Surgeon: Sherlyn Hay, DO;  Location: [facility];  Service: Gynecology;  Laterality: N/A;  . INTRAOPERATIVE TRANSESOPHAGEAL ECHOCARDIOGRAM N/A 02/14/2014   Procedure: INTRAOPERATIVE TRANSESOPHAGEAL ECHOCARDIOGRAM;  Surgeon: Gaye Pollack, MD;  Location: [place] [facility];  Service: Open Heart Surgery;  Laterality: N/A;  . LEFT AND RIGHT HEART CATHETERIZATION WITH CORONARY ANGIOGRAM N/A 01/25/2014   Procedure: LEFT AND RIGHT HEART CATHETERIZATION WITH CORONARY ANGIOGRAM;  Surgeon: Peter M Martinique, MD;  Location: [facility] CATH LAB;  Service: Cardiovascular;  Laterality: N/A;  . PERICARDIAL FLUID DRAINAGE N/A 03/27/2014   Procedure: DRAINAGE OF PERICARDIAL FLUID;  Surgeon: Grace Isaac, MD;  Location: [place];  Service: Thoracic;  Laterality: N/A;  . PLEURAL EFFUSION DRAINAGE Right 03/27/2014   Procedure: DRAINAGE OF PLEURAL EFFUSION;  Surgeon: Grace Isaac, MD;  Location: [place];  Service: Thoracic;  Laterality: Right;  Drainage of right pleural effusion  . SUBXYPHOID PERICARDIAL WINDOW N/A 03/27/2014   Procedure: SUBXYPHOID PERICARDIAL WINDOW;  Surgeon: Grace Isaac, MD;  Location: [place];  Service: Thoracic;  Laterality: N/A;  . TOTAL LAPAROSCOPIC HYSTERECTOMY WITH SALPINGECTOMY Bilateral 11/26/2017   Procedure: TOTAL LAPAROSCOPIC HYSTERECTOMY WITH BILATERAL SALPINGECTOMY LEFT OOPHERECTOMY;  Surgeon: Sherlyn Hay, DO;  Location: [facility];  Service: Gynecology;  Laterality: Bilateral;  . TUBAL LIGATION       SOCIAL HISTORY:  Social History   Socioeconomic History  . Marital status: Married    Spouse name: Not on file  . Number of children: 3  . Years of education: Not on file  . Highest education level: Not on file  Occupational History  . Occupation: Surveyor, quantity: [employer]  . Financial resource strain: Not on file  .  Food insecurity    Worry: Not on file    Inability: Not on file  . Transportation needs    Medical: Not on file    Non-medical: Not on file  Tobacco Use  . Smoking status: Never Smoker  . Smokeless tobacco: Never Used  Substance and Sexual Activity  . Alcohol use: No    Alcohol/week:  0.0 standard drinks  . Drug use: No  . Sexual activity: Not on file  Lifestyle  . Physical activity    Days per week: Not on file    Minutes per session: Not on file  . Stress: Not on file  Relationships  . Social Herbalist on phone: Not on file    Gets together: Not on file    Attends religious service: Not on file    Active member of club or organization: Not on file    Attends meetings of clubs or organizations: Not on file    Relationship status: Not on file  . Intimate partner violence    Fear of current or ex partner: Not on file    Emotionally abused: Not on file    Physically abused: Not on file    Forced sexual activity: Not on file  Other Topics Concern  . Not on file  Social History Narrative  . Not on file    FAMILY HISTORY:  Family History  Problem Relation Age of Onset  . Diabetes Mother   . Heart disease Mother   . Colon cancer Maternal Grandmother   . Brain cancer Brother   . Crohn's disease Brother     CURRENT MEDICATIONS:  Outpatient Encounter Medications as of 10/01/2018  Medication Sig  . Insulin Degludec (TRESIBA FLEXTOUCH) 200 UNIT/ML SOPN Inject 76 Units into the skin daily.  . metFORMIN (GLUCOPHAGE-XR) 500 MG 24 hr tablet Take 1 tablet (500 mg total) by mouth 2 (two) times daily.  . rosuvastatin (CRESTOR) 10 MG tablet Take 1 tablet (10 mg total) by mouth daily.  Marland Kitchen warfarin (COUMADIN) 5 MG tablet Take 0.5-1 tablets (2.5-5 mg total) by mouth See admin instructions. Take 2.5 mg by mouth daily except take 5 mg by mouth on Monday, Wednesday and Friday   No facility-administered encounter medications on file as of 10/01/2018.     ALLERGIES:  Allergies  Allergen Reactions  . Glipizide Nausea And Vomiting and Other (See Comments)    GI upset  . Metoprolol Other (See Comments)    "Made me sick"  . Shellfish Allergy Swelling    Throat and eyes were swollen.  Had difficulty breathing.  Was hospitalized.       PHYSICAL EXAM:  ECOG  Performance status: 1  Vitals:   10/01/18 0815  BP: (!) 141/80  Pulse: 71  Resp: 18  Temp: (!) 96.9 F (36.1 C)  SpO2: 100%   Filed Weights   10/01/18 0815  Weight: 235 lb 4.8 oz (106.7 kg)    Physical Exam Constitutional:      Appearance: Normal appearance.  Cardiovascular:     Rate and Rhythm: Normal rate and regular rhythm.     Heart sounds: Normal heart sounds.  Pulmonary:     Effort: Pulmonary effort is normal.     Breath sounds: Normal breath sounds.  Abdominal:     General: Bowel sounds are normal. There is no distension.     Palpations: Abdomen is soft.  Musculoskeletal:        General: No swelling.  Skin:  General: Skin is warm.  Neurological:     General: No focal deficit present.     Mental Status: She is alert and oriented to person, place, and time.  Psychiatric:        Mood and Affect: Mood normal.        Behavior: Behavior normal.      LABORATORY DATA:  I have reviewed the labs as listed.  CBC    Component Value Date/Time   WBC 5.8 09/24/2018 1005   RBC 4.57 09/24/2018 1005   HGB 12.3 09/24/2018 1005   HGB 11.9 02/26/2018 1544   HCT 39.8 09/24/2018 1005   HCT 37.1 02/26/2018 1544   PLT 97 (L) 09/24/2018 1005   PLT 151 02/26/2018 1544   MCV 87.1 09/24/2018 1005   MCV 78 (L) 02/26/2018 1544   MCH 26.9 09/24/2018 1005   MCHC 30.9 09/24/2018 1005   RDW 15.3 09/24/2018 1005   RDW 16.0 (H) 02/26/2018 1544   LYMPHSABS 1.5 09/24/2018 1005   LYMPHSABS 1.8 02/26/2018 1544   MONOABS 0.4 09/24/2018 1005   EOSABS 0.1 09/24/2018 1005   EOSABS 0.2 02/26/2018 1544   BASOSABS 0.0 09/24/2018 1005   BASOSABS 0.1 02/26/2018 1544   CMP Latest Ref Rng & Units 06/01/2018 03/05/2018 02/26/2018  Glucose 65 - 99 mg/dL 308(H) 267(H) 246(H)  BUN 6 - 24 mg/dL 13 10 8   Creatinine 0.57 - 1.00 mg/dL 0.73 0.63 0.79  Sodium 134 - 144 mmol/L 135 134(L) 138  Potassium 3.5 - 5.2 mmol/L 4.0 4.1 3.8  Chloride 96 - 106 mmol/L 100 100 100  CO2 20 - 29 mmol/L 21 26 22    Calcium 8.7 - 10.2 mg/dL 9.5 9.0 9.1  Total Protein 6.0 - 8.5 g/dL 7.0 7.6 6.6  Total Bilirubin 0.0 - 1.2 mg/dL 0.6 0.5 0.5  Alkaline Phos 39 - 117 IU/L 66 76 77  AST 0 - 40 IU/L 33 31 44(H)  ALT 0 - 32 IU/L 24 30 45(H)       DIAGNOSTIC IMAGING:  I have independently reviewed the scans and discussed with the patient.   I have reviewed Venita Lick LPN's note and agree with the documentation.  I personally performed a face-to-face visit, made revisions and my assessment and plan is as follows.    ASSESSMENT & PLAN:   Thrombocytopenia ([place]) 1.  Intermittent thrombocytopenia: - History of intermittent mild to moderate thrombocytopenia since 2016, when she had her AVR with a mechanical valve. - She is on Coumadin and denies any bleeding per rectum or melena. - Nutritional deficiency work-up was negative.  Hepatitis panel and H. pylori was negative.  ANA was positive with a speckled pattern with a dilution of 1: 320.  She does not have any clinical signs or symptoms of lupus. -Differential includes immune mediated thrombocytopenia. - Platelet count on 09/24/2018 was 97.  Hemoglobin was normal.  She does not have any active bleeding. - We will check her back in 6 months for follow-up and repeat blood counts.  2.  Microcytic anemia: - Hemoglobin is 12.3 with MCV of 87.  Ferritin is 28 and percent saturation of 8. - She denies any bleeding per rectum or melena.      Orders placed this encounter:  Orders Placed This Encounter  Procedures  . CBC with Differential/Platelet  . Comprehensive metabolic panel  . Ferritin  . Iron and TIBC      Derek Jack, MD [place] [phone]

## 2018-10-01 NOTE — Patient Instructions (Signed)
[facility] at [facility] Discharge Instructions  You were seen today by Dr. Katragadda. He went over your recent lab results. He will see you back in 6 months for labs and follow up.   Thank you for choosing [facility] at [facility] to provide your oncology and hematology care.  To afford each patient quality time with our provider, please arrive at least 15 minutes before your scheduled appointment time.   If you have a lab appointment with the Cancer Center please come in thru the  Main Entrance and check in at the main information desk  You need to re-schedule your appointment should you arrive 10 or more minutes late.  We strive to give you quality time with our providers, and arriving late affects you and other patients whose appointments are after yours.  Also, if you no show three or more times for appointments you may be dismissed from the clinic at the providers discretion.     Again, thank you for choosing [facility].  Our hope is that these requests will decrease the amount of time that you wait before being seen by our physicians.       _____________________________________________________________  Should you have questions after your visit to [facility], please contact our office at [phone] between the hours of 8:00 a.m. and 4:30 p.m.  Voicemails left after 4:00 p.m. will not be returned until the following business day.  For prescription refill requests, have your pharmacy contact our office and allow 72 hours.    Cancer Center Support Programs:   > Cancer Support Group  2nd Tuesday of the month 1pm-2pm, Journey Room    

## 2018-10-07 ENCOUNTER — Encounter (HOSPITAL_COMMUNITY): Payer: Self-pay | Admitting: Hematology

## 2018-10-07 NOTE — Assessment & Plan Note (Signed)
1.  Intermittent thrombocytopenia: - History of intermittent mild to moderate thrombocytopenia since 2016, when she had her AVR with a mechanical valve. - She is on Coumadin and denies any bleeding per rectum or melena. - Nutritional deficiency work-up was negative.  Hepatitis panel and H. pylori was negative.  ANA was positive with a speckled pattern with a dilution of 1: 320.  She does not have any clinical signs or symptoms of lupus. -Differential includes immune mediated thrombocytopenia. - Platelet count on 09/24/2018 was 97.  Hemoglobin was normal.  She does not have any active bleeding. - We will check her back in 6 months for follow-up and repeat blood counts.  2.  Microcytic anemia: - Hemoglobin is 12.3 with MCV of 87.  Ferritin is 28 and percent saturation of 8. - She denies any bleeding per rectum or melena.

## 2018-11-02 ENCOUNTER — Ambulatory Visit: Payer: Self-pay | Admitting: Nurse Practitioner

## 2018-11-09 DIAGNOSIS — Z23 Encounter for immunization: Secondary | ICD-10-CM | POA: Diagnosis not present

## 2018-11-09 DIAGNOSIS — N939 Abnormal uterine and vaginal bleeding, unspecified: Secondary | ICD-10-CM | POA: Diagnosis not present

## 2018-11-09 DIAGNOSIS — Z6835 Body mass index (BMI) 35.0-35.9, adult: Secondary | ICD-10-CM | POA: Diagnosis not present

## 2018-11-09 DIAGNOSIS — Z952 Presence of prosthetic heart valve: Secondary | ICD-10-CM | POA: Diagnosis not present

## 2018-11-09 DIAGNOSIS — Z90710 Acquired absence of both cervix and uterus: Secondary | ICD-10-CM | POA: Diagnosis not present

## 2018-11-09 DIAGNOSIS — E1165 Type 2 diabetes mellitus with hyperglycemia: Secondary | ICD-10-CM | POA: Diagnosis not present

## 2018-12-09 DIAGNOSIS — Z952 Presence of prosthetic heart valve: Secondary | ICD-10-CM | POA: Diagnosis not present

## 2018-12-23 DIAGNOSIS — Z952 Presence of prosthetic heart valve: Secondary | ICD-10-CM | POA: Diagnosis not present

## 2019-01-26 NOTE — Progress Notes (Signed)
CARDIOLOGY OFFICE NOTE  Date:  02/01/2019    Anna Salinas Date of Birth: 09-18-1970 Medical Record [id number]  PCP:  [facility]  Cardiologist:  Anna Femia Martinique  MD  Chief Complaint  Patient presents with  . Aortic Stenosis     History of Present Illness: Anna Salinas is a 49 y.o. female who presents for follow up AVR.   She has a history of AVR in 02/2014 for severe AS with a bicuspid AV. She is on chronic coumadin.  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 These were drained. Repeat CXR and Echo in April 2016 showed resolution of effusions and normal valve function.   In Dec 2017 she was complaining of fatigue with periods of exhaustion. No specific cardiac cause found. Felt to be related to poorly controlled DM with A1c 10.5%. She was started on insulin along with metformin.   She was admitted 2019 with severe anemia related to Dysfunctional uterine bleeding. Hgb down to 6.6. Transfused 2 units PRBCs and Hgb improved to 8.8. She did have placement of IUD in April to try and help with the heavy menses. Unable to stop Coumadin due to mechanical AV.  During hospitalization she was noted to have elevated troponin to 0.48. This was felt to be related to demand ischemia secondary to anemia.   On November 13 she underwent laparoscopic total hysterectomy for definitive treatment of her uterine bleeding.   She had elbow surgery in July 2020 after she fell and fractured it.   She reports she is doing well. She states her labs showed low platelet count in September. Doctors are concerned she may have lupus. No bleeding issues. No rash or joint pain. INR checks have been OK.   Past Medical History:  Diagnosis Date  . Aortic stenosis due to bicuspid aortic valve   . Blood transfusion without reported diagnosis   . Essential hypertension, benign   . Palpitations   . S/P laparoscopic hysterectomy 11/26/2017  . Type 2  diabetes mellitus ([place])     Past Surgical History:  Procedure Laterality Date  . AORTIC VALVE REPLACEMENT N/A 02/14/2014   Procedure: AORTIC VALVE REPLACEMENT (AVR);  Surgeon: Anna Pollack, MD;  Location: [place];  Service: Open Heart Surgery;  Laterality: N/A;  . CARDIAC CATHETERIZATION    . CESAREAN SECTION  2009  . CHEST TUBE INSERTION Right 03/27/2014   Procedure: CHEST TUBE INSERTION;  Surgeon: Grace Isaac, MD;  Location: [place];  Service: Thoracic;  Laterality: Right;  . CYSTOSCOPY N/A 11/26/2017   Procedure: CYSTOSCOPY;  Surgeon: Sherlyn Hay, DO;  Location: [facility];  Service: Gynecology;  Laterality: N/A;  . INTRAOPERATIVE TRANSESOPHAGEAL ECHOCARDIOGRAM N/A 02/14/2014   Procedure: INTRAOPERATIVE TRANSESOPHAGEAL ECHOCARDIOGRAM;  Surgeon: Anna Pollack, MD;  Location: [place] [facility];  Service: Open Heart Surgery;  Laterality: N/A;  . LEFT AND RIGHT HEART CATHETERIZATION WITH CORONARY ANGIOGRAM N/A 01/25/2014   Procedure: LEFT AND RIGHT HEART CATHETERIZATION WITH CORONARY ANGIOGRAM;  Surgeon: Estefanny Moler M Martinique, MD;  Location[facility] CATH LAB;  Service: Cardiovascular;  Laterality: N/A;  . PERICARDIAL FLUID DRAINAGE N/A 03/27/2014   Procedure: DRAINAGE OF PERICARDIAL FLUID;  Surgeon: Grace Isaac, MD;  Location: [place];  Service: Thoracic;  Laterality: N/A;  . PLEURAL EFFUSION DRAINAGE Right 03/27/2014   Procedure: DRAINAGE OF PLEURAL EFFUSION;  Surgeon: Grace Isaac, MD;  Location: [place];  Service: Thoracic;  Laterality: Right;  Drainage of right pleural  effusion  . SUBXYPHOID PERICARDIAL WINDOW N/A 03/27/2014   Procedure: SUBXYPHOID PERICARDIAL WINDOW;  Surgeon: Grace Isaac, MD;  Location: [place];  Service: Thoracic;  Laterality: N/A;  . TOTAL LAPAROSCOPIC HYSTERECTOMY WITH SALPINGECTOMY Bilateral 11/26/2017   Procedure: TOTAL LAPAROSCOPIC HYSTERECTOMY WITH BILATERAL SALPINGECTOMY LEFT OOPHERECTOMY;  Surgeon: Sherlyn Hay, DO;  Location: [facility];  Service: Gynecology;   Laterality: Bilateral;  . TUBAL LIGATION       Medications: Current Outpatient Medications  Medication Sig Dispense Refill  . Insulin Degludec (TRESIBA FLEXTOUCH) 200 UNIT/ML SOPN Inject 76 Units into the skin daily. 5 pen 5  . metFORMIN (GLUCOPHAGE-XR) 500 MG 24 hr tablet Take 1 tablet (500 mg total) by mouth 2 (two) times daily. 180 tablet 1  . rosuvastatin (CRESTOR) 10 MG tablet Take 1 tablet (10 mg total) by mouth daily. 90 tablet 3  . warfarin (COUMADIN) 5 MG tablet Take 0.5-1 tablets (2.5-5 mg total) by mouth See admin instructions. Take 2.5 mg by mouth daily except take 5 mg by mouth on Monday, Wednesday and Friday 30 tablet 5   No current facility-administered medications for this visit.    Allergies: Allergies  Allergen Reactions  . Glipizide Nausea And Vomiting and Other (See Comments)    GI upset  . Metoprolol Other (See Comments)    "Made me sick"  . Shellfish Allergy Swelling    Throat and eyes were swollen.  Had difficulty breathing.  Was hospitalized.      Social History: The patient  reports that she has never smoked. She has never used smokeless tobacco. She reports that she does not drink alcohol or use drugs.   Family History: The patient's family history includes Brain cancer in her brother; Colon cancer in her maternal grandmother; Crohn's disease in her brother; Diabetes in her mother; Heart disease in her mother.   Review of Systems: Please see the history of present illness.  Glucose has been elevated.  All other systems are reviewed and negative.   Physical Exam: VS:  BP 132/84   Pulse 63   Ht 5\' 8"  (1.727 m)   Wt 237 lb 3.2 oz (107.6 kg)   LMP 04/14/2017   SpO2 99%   BMI 36.07 kg/m  .  BMI Body mass index is 36.07 kg/m.  Wt Readings from Last 3 Encounters:  02/01/19 237 lb 3.2 oz (107.6 kg)  10/01/18 235 lb 4.8 oz (106.7 kg)  07/24/18 239 lb (108.4 kg)   GENERAL:  Well appearing obese WF in NAD HEENT:  PERRL, EOMI, sclera are clear.  Oropharynx is clear. NECK:  No jugular venous distention, carotid upstroke brisk and symmetric, no bruits, no thyromegaly or adenopathy LUNGS:  Clear to auscultation bilaterally CHEST:  Unremarkable HEART:  RRR,  PMI not displaced or sustained,good mechanical S2. , no S3, no S4: no clicks, no rubs, no murmurs ABD:  Soft, nontender. BS +, no masses or bruits. No hepatomegaly, no splenomegaly EXT:  2 + pulses throughout, no edema, no cyanosis no clubbing SKIN:  Warm and dry.  No rashes NEURO:  Alert and oriented x 3. Cranial nerves II through XII intact. PSYCH:  Cognitively intact    LABORATORY DATA:  EKG:  EKG is ordered today. NSR rate 63. Normal Ecg. I have personally reviewed and interpreted this study.   Lab Results  Component Value Date   WBC 5.8 09/24/2018   HGB 12.3 09/24/2018   HCT 39.8 09/24/2018   PLT 97 (L) 09/24/2018   GLUCOSE 308 (  H) 06/01/2018   CHOL 102 06/01/2018   TRIG 117 06/01/2018   HDL 32 (L) 06/01/2018   LDLCALC 47 06/01/2018   ALT 24 06/01/2018   AST 33 06/01/2018   NA 135 06/01/2018   K 4.0 06/01/2018   CL 100 06/01/2018   CREATININE 0.73 06/01/2018   BUN 13 06/01/2018   CO2 21 06/01/2018   TSH 0.93 01/03/2016   INR 4.0 (H) 07/24/2018   HGBA1C 9.3 (H) 07/24/2018   MICROALBUR negative 04/14/2014   Dated 11/10/18: A1c 9.1%, CBC and CMET normal.  Cholesterol 97, triglycerides 98, HDL 33, LDL 47.   BNP (last 3 results) No results for input(s): BNP in the last 8760 hours.  ProBNP (last 3 results) No results for input(s): PROBNP in the last 8760 hours.   Other Studies Reviewed Today:   Echo: 03/26/2014 Study Conclusions - Left ventricle: The cavity size was normal. Wall thickness was normal. Systolic function was vigorous. The estimated ejection fraction was in the range of 65% to 70%. Wall motion was normal; there were no regional wall motion abnormalities. - Aortic valve: A [employer] mechanical prosthesis was present. No  obvious perivalvular leak based on limited views. There was no significant regurgitation. Mean gradient (S): 15 mm Hg. Peak gradient (S): 28 mm Hg. - Right ventricle: The cavity size was below normal. - Tricuspid valve: There was trivial regurgitation. - Pulmonary arteries: Systolic pressure could not be accurately estimated. - Pericardium, extracardiac: A large pericardial effusion was identified circumferential to the heart, measures approximately 4 cm posteriorly and 3 cm anteriorly. There is compression of the right ventricle noted in the subcostal view and further evidence of tamponade physiology based on respiratory variation in mitral inflow. There was a right pleural effusion. Impressions: - Normal LV wall thickness with LVEF 65-70%. [employer]. Jude mechanical AVR without obvious perivalvular leak based on limited and with grossly normal gradients. A large pericardial effusion was identified circumferential to the heart, measures approximately 4 cm posteriorly and 3-4 cm anteriorly. There is compression of the right ventricle noted in the subcostal view and further evidence of tamponade physiology based on respiratory variation in mitral inflow. Right pleural effusion also noted. Results discussed with Dr. Servando Snare.  Echo 06/19/17: Study Conclusions  - Left ventricle: The cavity size was normal. Systolic function was   normal. The estimated ejection fraction was in the range of 60%   to 65%. Wall motion was normal; there were no regional wall   motion abnormalities. Left ventricular diastolic function   parameters were normal. - Aortic valve: There was very mild stenosis. - Right ventricle: The cavity size was normal. Wall thickness was   normal. Systolic function was normal. - Right atrium: The atrium was normal in size. - Tricuspid valve: Structurally normal valve. There was mild   regurgitation. - Pulmonary arteries: The main pulmonary artery  was normal-sized.   Systolic pressure was within the normal range. - Inferior vena cava: The vessel was normal in size. - Pericardium, extracardiac: There was no pericardial effusion.  Impressions:  - S/P AVR with a mechanical valve. Leaflets are not well   visualized. Transaortic gradients are slightly higher than on a   prior study from 04/15/2014 now peak/mean 38/19 mmHg, peak velocity   3.1 m/s, but still within upper normal range for this type of   prosthesis.  Assessment/Plan: 1. S/p AVR. continue long term coumadin. Reviewed recommendations for SBE prophylaxis. Valve click is normal today.  2. DM-  Per primary  care. On insulin.  3. Chronic coumadin therapy. INR monitored by primary care.   4. Thrombocytopenia. Followed by primary care.    Disposition:   FU with me in one year  Signed: Rosslyn Pasion Martinique MD, [facility]    02/01/2019 10:21 AM

## 2019-02-01 ENCOUNTER — Ambulatory Visit: Payer: BC Managed Care – PPO | Admitting: Cardiology

## 2019-02-01 ENCOUNTER — Other Ambulatory Visit: Payer: Self-pay

## 2019-02-01 ENCOUNTER — Encounter: Payer: Self-pay | Admitting: Cardiology

## 2019-02-01 VITALS — BP 132/84 | HR 63 | Ht 68.0 in | Wt 237.2 lb

## 2019-02-01 DIAGNOSIS — Z952 Presence of prosthetic heart valve: Secondary | ICD-10-CM

## 2019-02-01 DIAGNOSIS — I10 Essential (primary) hypertension: Secondary | ICD-10-CM

## 2019-02-03 DIAGNOSIS — D649 Anemia, unspecified: Secondary | ICD-10-CM | POA: Diagnosis not present

## 2019-02-03 DIAGNOSIS — D696 Thrombocytopenia, unspecified: Secondary | ICD-10-CM | POA: Diagnosis not present

## 2019-02-03 DIAGNOSIS — Z952 Presence of prosthetic heart valve: Secondary | ICD-10-CM | POA: Diagnosis not present

## 2019-02-03 DIAGNOSIS — E1165 Type 2 diabetes mellitus with hyperglycemia: Secondary | ICD-10-CM | POA: Diagnosis not present

## 2019-02-10 ENCOUNTER — Other Ambulatory Visit: Payer: Self-pay | Admitting: Nurse Practitioner

## 2019-02-10 DIAGNOSIS — E119 Type 2 diabetes mellitus without complications: Secondary | ICD-10-CM

## 2019-02-10 DIAGNOSIS — E1165 Type 2 diabetes mellitus with hyperglycemia: Secondary | ICD-10-CM | POA: Diagnosis not present

## 2019-02-10 DIAGNOSIS — D649 Anemia, unspecified: Secondary | ICD-10-CM | POA: Diagnosis not present

## 2019-02-10 DIAGNOSIS — Z952 Presence of prosthetic heart valve: Secondary | ICD-10-CM | POA: Diagnosis not present

## 2019-03-01 ENCOUNTER — Other Ambulatory Visit: Payer: Self-pay | Admitting: Nurse Practitioner

## 2019-03-01 DIAGNOSIS — Z7901 Long term (current) use of anticoagulants: Secondary | ICD-10-CM

## 2019-03-21 ENCOUNTER — Other Ambulatory Visit: Payer: Self-pay | Admitting: Nurse Practitioner

## 2019-03-21 DIAGNOSIS — Z7901 Long term (current) use of anticoagulants: Secondary | ICD-10-CM

## 2019-03-21 DIAGNOSIS — E119 Type 2 diabetes mellitus without complications: Secondary | ICD-10-CM

## 2019-03-24 ENCOUNTER — Other Ambulatory Visit: Payer: Self-pay

## 2019-03-24 ENCOUNTER — Inpatient Hospital Stay (HOSPITAL_COMMUNITY): Payer: BC Managed Care – PPO | Attending: Hematology

## 2019-03-24 DIAGNOSIS — Z90710 Acquired absence of both cervix and uterus: Secondary | ICD-10-CM | POA: Insufficient documentation

## 2019-03-24 DIAGNOSIS — D696 Thrombocytopenia, unspecified: Secondary | ICD-10-CM | POA: Insufficient documentation

## 2019-03-24 DIAGNOSIS — Z800 Family history of malignant neoplasm of digestive organs: Secondary | ICD-10-CM | POA: Insufficient documentation

## 2019-03-24 DIAGNOSIS — Z952 Presence of prosthetic heart valve: Secondary | ICD-10-CM | POA: Insufficient documentation

## 2019-03-24 DIAGNOSIS — Z9079 Acquired absence of other genital organ(s): Secondary | ICD-10-CM | POA: Diagnosis not present

## 2019-03-24 DIAGNOSIS — I10 Essential (primary) hypertension: Secondary | ICD-10-CM | POA: Insufficient documentation

## 2019-03-24 DIAGNOSIS — D509 Iron deficiency anemia, unspecified: Secondary | ICD-10-CM | POA: Insufficient documentation

## 2019-03-24 DIAGNOSIS — Z808 Family history of malignant neoplasm of other organs or systems: Secondary | ICD-10-CM | POA: Insufficient documentation

## 2019-03-24 DIAGNOSIS — Z90722 Acquired absence of ovaries, bilateral: Secondary | ICD-10-CM | POA: Diagnosis not present

## 2019-03-24 DIAGNOSIS — Z7901 Long term (current) use of anticoagulants: Secondary | ICD-10-CM | POA: Diagnosis not present

## 2019-03-24 DIAGNOSIS — E119 Type 2 diabetes mellitus without complications: Secondary | ICD-10-CM | POA: Diagnosis not present

## 2019-03-24 DIAGNOSIS — Z794 Long term (current) use of insulin: Secondary | ICD-10-CM | POA: Diagnosis not present

## 2019-03-24 LAB — FERRITIN: Ferritin: 24 ng/mL (ref 11–307)

## 2019-03-24 LAB — CBC WITH DIFFERENTIAL/PLATELET
Abs Immature Granulocytes: 0.02 10*3/uL (ref 0.00–0.07)
Basophils Absolute: 0 10*3/uL (ref 0.0–0.1)
Basophils Relative: 1 %
Eosinophils Absolute: 0.1 10*3/uL (ref 0.0–0.5)
Eosinophils Relative: 2 %
HCT: 41.3 % (ref 36.0–46.0)
Hemoglobin: 13 g/dL (ref 12.0–15.0)
Immature Granulocytes: 0 %
Lymphocytes Relative: 26 %
Lymphs Abs: 1.5 10*3/uL (ref 0.7–4.0)
MCH: 27.6 pg (ref 26.0–34.0)
MCHC: 31.5 g/dL (ref 30.0–36.0)
MCV: 87.7 fL (ref 80.0–100.0)
Monocytes Absolute: 0.3 10*3/uL (ref 0.1–1.0)
Monocytes Relative: 6 %
Neutro Abs: 3.7 10*3/uL (ref 1.7–7.7)
Neutrophils Relative %: 65 %
Platelets: 100 10*3/uL — ABNORMAL LOW (ref 150–400)
RBC: 4.71 MIL/uL (ref 3.87–5.11)
RDW: 14.6 % (ref 11.5–15.5)
WBC: 5.7 10*3/uL (ref 4.0–10.5)
nRBC: 0 % (ref 0.0–0.2)

## 2019-03-24 LAB — COMPREHENSIVE METABOLIC PANEL
ALT: 24 U/L (ref 0–44)
AST: 26 U/L (ref 15–41)
Albumin: 3.5 g/dL (ref 3.5–5.0)
Alkaline Phosphatase: 60 U/L (ref 38–126)
Anion gap: 8 (ref 5–15)
BUN: 10 mg/dL (ref 6–20)
CO2: 26 mmol/L (ref 22–32)
Calcium: 8.9 mg/dL (ref 8.9–10.3)
Chloride: 102 mmol/L (ref 98–111)
Creatinine, Ser: 0.7 mg/dL (ref 0.44–1.00)
GFR calc Af Amer: >60 mL/min (ref >60)
GFR calc non Af Amer: >60 mL/min (ref >60)
Glucose, Bld: 250 mg/dL — ABNORMAL HIGH (ref 70–99)
Potassium: 3.7 mmol/L (ref 3.5–5.1)
Sodium: 136 mmol/L (ref 135–145)
Total Bilirubin: 1 mg/dL (ref 0.3–1.2)
Total Protein: 6.9 g/dL (ref 6.5–8.1)

## 2019-03-24 LAB — IRON AND TIBC
Iron: 54 ug/dL (ref 28–170)
Saturation Ratios: 13 % (ref 10.4–31.8)
TIBC: 429 ug/dL (ref 250–450)
UIBC: 375 ug/dL

## 2019-03-31 ENCOUNTER — Other Ambulatory Visit: Payer: Self-pay

## 2019-03-31 ENCOUNTER — Inpatient Hospital Stay (HOSPITAL_COMMUNITY): Payer: BC Managed Care – PPO | Admitting: Hematology

## 2019-03-31 VITALS — BP 120/68 | HR 74 | Temp 97.1°F | Resp 18 | Wt 241.0 lb

## 2019-03-31 DIAGNOSIS — Z7901 Long term (current) use of anticoagulants: Secondary | ICD-10-CM | POA: Diagnosis not present

## 2019-03-31 DIAGNOSIS — Z952 Presence of prosthetic heart valve: Secondary | ICD-10-CM | POA: Diagnosis not present

## 2019-03-31 DIAGNOSIS — D649 Anemia, unspecified: Secondary | ICD-10-CM

## 2019-03-31 DIAGNOSIS — D696 Thrombocytopenia, unspecified: Secondary | ICD-10-CM | POA: Diagnosis not present

## 2019-03-31 DIAGNOSIS — Z90710 Acquired absence of both cervix and uterus: Secondary | ICD-10-CM | POA: Diagnosis not present

## 2019-03-31 DIAGNOSIS — Z794 Long term (current) use of insulin: Secondary | ICD-10-CM | POA: Diagnosis not present

## 2019-03-31 DIAGNOSIS — E119 Type 2 diabetes mellitus without complications: Secondary | ICD-10-CM | POA: Diagnosis not present

## 2019-03-31 DIAGNOSIS — Z9079 Acquired absence of other genital organ(s): Secondary | ICD-10-CM | POA: Diagnosis not present

## 2019-03-31 DIAGNOSIS — I10 Essential (primary) hypertension: Secondary | ICD-10-CM | POA: Diagnosis not present

## 2019-03-31 DIAGNOSIS — Z808 Family history of malignant neoplasm of other organs or systems: Secondary | ICD-10-CM | POA: Diagnosis not present

## 2019-03-31 DIAGNOSIS — Z800 Family history of malignant neoplasm of digestive organs: Secondary | ICD-10-CM | POA: Diagnosis not present

## 2019-03-31 DIAGNOSIS — D509 Iron deficiency anemia, unspecified: Secondary | ICD-10-CM | POA: Diagnosis not present

## 2019-03-31 DIAGNOSIS — Z90722 Acquired absence of ovaries, bilateral: Secondary | ICD-10-CM | POA: Diagnosis not present

## 2019-03-31 NOTE — Progress Notes (Signed)
[address] [place], [place] [address]   CLINIC:  Medical Oncology/Hematology  PCP:  [facility] [place] [id number] [phone]   REASON FOR VISIT:  Follow-up for Intermittent Thrombocytopenia and anemia     INTERVAL HISTORY:  Anna Salinas 49 y.o. female seen for follow-up of intermittent thrombocytopenia and iron deficiency state.  Reports appetite of 100%.  Energy levels are 50%.  No pains reported.  Shortness of breath on exertion is stable.  Tingling in the feet is also stable.  Denies any fevers, night sweats or weight loss in the last 6 months.  No infections or hospitalizations.   REVIEW OF SYSTEMS:  Review of Systems  Respiratory: Positive for shortness of breath.   Neurological: Positive for numbness.  Psychiatric/Behavioral: Positive for sleep disturbance.  All other systems reviewed and are negative.    PAST MEDICAL/SURGICAL HISTORY:  Past Medical History:  Diagnosis Date  . Aortic stenosis due to bicuspid aortic valve   . Blood transfusion without reported diagnosis   . Essential hypertension, benign   . Palpitations   . S/P laparoscopic hysterectomy 11/26/2017  . Type 2 diabetes mellitus ([place])    Past Surgical History:  Procedure Laterality Date  . AORTIC VALVE REPLACEMENT N/A 02/14/2014   Procedure: AORTIC VALVE REPLACEMENT (AVR);  Surgeon: Gaye Pollack, MD;  Location: [place];  Service: Open Heart Surgery;  Laterality: N/A;  . CARDIAC CATHETERIZATION    . CESAREAN SECTION  2009  . CHEST TUBE INSERTION Right 03/27/2014   Procedure: CHEST TUBE INSERTION;  Surgeon: Grace Isaac, MD;  Location: [place];  Service: Thoracic;  Laterality: Right;  . CYSTOSCOPY N/A 11/26/2017   Procedure: CYSTOSCOPY;  Surgeon: Sherlyn Hay, DO;  Location: [facility];  Service: Gynecology;  Laterality: N/A;  . INTRAOPERATIVE TRANSESOPHAGEAL ECHOCARDIOGRAM N/A 02/14/2014   Procedure: INTRAOPERATIVE TRANSESOPHAGEAL  ECHOCARDIOGRAM;  Surgeon: Gaye Pollack, MD;  Location: [place] [facility];  Service: Open Heart Surgery;  Laterality: N/A;  . LEFT AND RIGHT HEART CATHETERIZATION WITH CORONARY ANGIOGRAM N/A 01/25/2014   Procedure: LEFT AND RIGHT HEART CATHETERIZATION WITH CORONARY ANGIOGRAM;  Surgeon: Peter M Martinique, MD;  Location: [facility] CATH LAB;  Service: Cardiovascular;  Laterality: N/A;  . PERICARDIAL FLUID DRAINAGE N/A 03/27/2014   Procedure: DRAINAGE OF PERICARDIAL FLUID;  Surgeon: Grace Isaac, MD;  Location: [place];  Service: Thoracic;  Laterality: N/A;  . PLEURAL EFFUSION DRAINAGE Right 03/27/2014   Procedure: DRAINAGE OF PLEURAL EFFUSION;  Surgeon: Grace Isaac, MD;  Location: [place];  Service: Thoracic;  Laterality: Right;  Drainage of right pleural effusion  . SUBXYPHOID PERICARDIAL WINDOW N/A 03/27/2014   Procedure: SUBXYPHOID PERICARDIAL WINDOW;  Surgeon: Grace Isaac, MD;  Location: [place];  Service: Thoracic;  Laterality: N/A;  . TOTAL LAPAROSCOPIC HYSTERECTOMY WITH SALPINGECTOMY Bilateral 11/26/2017   Procedure: TOTAL LAPAROSCOPIC HYSTERECTOMY WITH BILATERAL SALPINGECTOMY LEFT OOPHERECTOMY;  Surgeon: Sherlyn Hay, DO;  Location: [facility];  Service: Gynecology;  Laterality: Bilateral;  . TUBAL LIGATION       SOCIAL HISTORY:  Social History   Socioeconomic History  . Marital status: Married    Spouse name: Not on file  . Number of children: 3  . Years of education: Not on file  . Highest education level: Not on file  Occupational History  . Occupation: Surveyor, quantity: [employer]  Tobacco Use  . Smoking status: Never Smoker  . Smokeless tobacco: Never Used  Substance and Sexual Activity  . Alcohol use: No    Alcohol/week: 0.0 standard drinks  . Drug use: No  . Sexual activity: Not on file  Other Topics Concern  . Not on file  Social History Narrative  . Not on file   Social Determinants of Health   Financial Resource Strain:   . Difficulty of Paying Living  Expenses:   Food Insecurity:   . Worried About Charity fundraiser in the Last Year:   . Arboriculturist in the Last Year:   Transportation Needs:   . Film/video editor (Medical):   Marland Kitchen Lack of Transportation (Non-Medical):   Physical Activity:   . Days of Exercise per Week:   . Minutes of Exercise per Session:   Stress:   . Feeling of Stress :   Social Connections:   . Frequency of Communication with Friends and Family:   . Frequency of Social Gatherings with Friends and Family:   . Attends Religious Services:   . Active Member of Clubs or Organizations:   . Attends Archivist Meetings:   Marland Kitchen Marital Status:   Intimate Partner Violence:   . Fear of Current or Ex-Partner:   . Emotionally Abused:   Marland Kitchen Physically Abused:   . Sexually Abused:     FAMILY HISTORY:  Family History  Problem Relation Age of Onset  . Diabetes Mother   . Heart disease Mother   . Colon cancer Maternal Grandmother   . Brain cancer Brother   . Crohn's disease Brother     CURRENT MEDICATIONS:  Outpatient Encounter Medications as of 03/31/2019  Medication Sig  . Insulin Degludec (TRESIBA FLEXTOUCH) 200 UNIT/ML SOPN Inject 76 Units into the skin daily.  . metFORMIN (GLUCOPHAGE-XR) 500 MG 24 hr tablet Take 1 tablet (500 mg total) by mouth 2 (two) times daily.  . rosuvastatin (CRESTOR) 10 MG tablet Take 1 tablet (10 mg total) by mouth daily.  Marland Kitchen warfarin (COUMADIN) 5 MG tablet Take 0.5-1 tablets (2.5-5 mg total) by mouth See admin instructions. Take 2.5 mg by mouth daily except take 5 mg by mouth on Monday, Wednesday and Friday   No facility-administered encounter medications on file as of 03/31/2019.    ALLERGIES:  Allergies  Allergen Reactions  . Glipizide Nausea And Vomiting and Other (See Comments)    GI upset  . Metoprolol Other (See Comments)    "Made me sick"  . Shellfish Allergy Swelling    Throat and eyes were swollen.  Had difficulty breathing.  Was hospitalized.        PHYSICAL EXAM:  ECOG Performance status: 1  Vitals:   03/31/19 0805  BP: 120/68  Pulse: 74  Resp: 18  Temp: (!) 97.1 F (36.2 C)  SpO2: 98%   Filed Weights   03/31/19 0805  Weight: 241 lb (109.3 kg)    Physical Exam Constitutional:      Appearance: Normal appearance.  Cardiovascular:     Rate and Rhythm: Normal rate and regular rhythm.     Heart sounds: Normal heart sounds.  Pulmonary:     Effort: Pulmonary effort is normal.     Breath sounds: Normal breath sounds.  Abdominal:     General: Bowel sounds are normal. There is no distension.     Palpations: Abdomen is soft.  Musculoskeletal:        General: No swelling.  Skin:    General: Skin is warm.  Neurological:     General: No  focal deficit present.     Mental Status: She is alert and oriented to person, place, and time.  Psychiatric:        Mood and Affect: Mood normal.        Behavior: Behavior normal.    Mechanical heart sounds heard.  LABORATORY DATA:  I have reviewed the labs as listed.  CBC    Component Value Date/Time   WBC 5.7 03/24/2019 1010   RBC 4.71 03/24/2019 1010   HGB 13.0 03/24/2019 1010   HGB 11.9 02/26/2018 1544   HCT 41.3 03/24/2019 1010   HCT 37.1 02/26/2018 1544   PLT 100 (L) 03/24/2019 1010   PLT 151 02/26/2018 1544   MCV 87.7 03/24/2019 1010   MCV 78 (L) 02/26/2018 1544   MCH 27.6 03/24/2019 1010   MCHC 31.5 03/24/2019 1010   RDW 14.6 03/24/2019 1010   RDW 16.0 (H) 02/26/2018 1544   LYMPHSABS 1.5 03/24/2019 1010   LYMPHSABS 1.8 02/26/2018 1544   MONOABS 0.3 03/24/2019 1010   EOSABS 0.1 03/24/2019 1010   EOSABS 0.2 02/26/2018 1544   BASOSABS 0.0 03/24/2019 1010   BASOSABS 0.1 02/26/2018 1544   CMP Latest Ref Rng & Units 03/24/2019 06/01/2018 03/05/2018  Glucose 70 - 99 mg/dL 250(H) 308(H) 267(H)  BUN 6 - 20 mg/dL 10 13 10   Creatinine 0.44 - 1.00 mg/dL 0.70 0.73 0.63  Sodium 135 - 145 mmol/L 136 135 134(L)  Potassium 3.5 - 5.1 mmol/L 3.7 4.0 4.1  Chloride 98 - 111  mmol/L 102 100 100  CO2 22 - 32 mmol/L 26 21 26   Calcium 8.9 - 10.3 mg/dL 8.9 9.5 9.0  Total Protein 6.5 - 8.1 g/dL 6.9 7.0 7.6  Total Bilirubin 0.3 - 1.2 mg/dL 1.0 0.6 0.5  Alkaline Phos 38 - 126 U/L 60 66 76  AST 15 - 41 U/L 26 33 31  ALT 0 - 44 U/L 24 24 30        DIAGNOSTIC IMAGING:  I have reviewed scans.   I have reviewed Venita Lick LPN's note and agree with the documentation.  I personally performed a face-to-face visit, made revisions and my assessment and plan is as follows.    ASSESSMENT & PLAN:   Thrombocytopenia ([place]) 1.  Intermittent thrombocytopenia: -Mild to moderate thrombocytopenia since 2016, when she had her AVR with mechanical valve. -She is on Coumadin and denies any easy bruising or bleeding. -Nutritional deficiency work-up, hepatitis panel was negative.  ANA was positive with speckled pattern with elevation of 2320.  She does not have any clinical signs or symptoms of lupus. -We reviewed her labs.  Hemoglobin and white count is normal.  Platelet count is 100.  Platelet count in September 2020 was 97. -We will continue to monitor her platelet count at 12-month intervals.  2.  Microcytic anemia: -This has resolved.  Hemoglobin is normal.  Ferritin is 24, slightly decreased from 28 last time. -She denies any bleeding per rectum or melena.      Orders placed this encounter:  Orders Placed This Encounter  Procedures  . CBC with Differential  . Ferritin  . Iron and TIBC      Derek Jack, MD [place] [phone]

## 2019-03-31 NOTE — Progress Notes (Signed)
Anna Salinas, Anna Salinas   CLINIC:  Medical Oncology/Hematology  PCP:  [facility] [place] [address] [phone]   REASON FOR VISIT:  Follow-up for Intermittent Thrombocytopenia and anemia     INTERVAL HISTORY:  Anna Salinas 49 y.o. female seen for follow-up of intermittent thrombocytopenia and microcytic anemia.  Appetite and energy levels are 75%.  Occasional leg swellings have been stable.  No pains reported.  Denies any fevers, night sweats or weight loss in the last 6 months.  Denies any bleeding per rectum, hematuria or melena.  Denies any nosebleeds.  Mild easy bruising is present.  She continues to be on Coumadin for her mechanical aortic valve.   REVIEW OF SYSTEMS:  Review of Systems  Cardiovascular: Positive for leg swelling.  Psychiatric/Behavioral: Positive for sleep disturbance.  All other systems reviewed and are negative.    PAST MEDICAL/SURGICAL HISTORY:  Past Medical History:  Diagnosis Date  . Aortic stenosis due to bicuspid aortic valve   . Blood transfusion without reported diagnosis   . Essential hypertension, benign   . Palpitations   . S/P laparoscopic hysterectomy 11/26/2017  . Type 2 diabetes mellitus ([place])    Past Surgical History:  Procedure Laterality Date  . AORTIC VALVE REPLACEMENT N/A 02/14/2014   Procedure: AORTIC VALVE REPLACEMENT (AVR);  Surgeon: Gaye Pollack, MD;  Location: [facility];  Service: Open Heart Surgery;  Laterality: N/A;  . CARDIAC CATHETERIZATION    . CESAREAN SECTION  2009  . CHEST TUBE INSERTION Right 03/27/2014   Procedure: CHEST TUBE INSERTION;  Surgeon: Grace Isaac, MD;  Location: [facility];  Service: Thoracic;  Laterality: Right;  . CYSTOSCOPY N/A 11/26/2017   Procedure: CYSTOSCOPY;  Surgeon: Sherlyn Hay, DO;  Location: [facility];  Service: Gynecology;  Laterality: N/A;  . INTRAOPERATIVE TRANSESOPHAGEAL ECHOCARDIOGRAM N/A 02/14/2014   Procedure:  INTRAOPERATIVE TRANSESOPHAGEAL ECHOCARDIOGRAM;  Surgeon: Gaye Pollack, MD;  Location: [facility];  Service: Open Heart Surgery;  Laterality: N/A;  . LEFT AND RIGHT HEART CATHETERIZATION WITH CORONARY ANGIOGRAM N/A 01/25/2014   Procedure: LEFT AND RIGHT HEART CATHETERIZATION WITH CORONARY ANGIOGRAM;  Surgeon: Peter M Martinique, MD;  Location: [facility];  Service: Cardiovascular;  Laterality: N/A;  . PERICARDIAL FLUID DRAINAGE N/A 03/27/2014   Procedure: DRAINAGE OF PERICARDIAL FLUID;  Surgeon: Grace Isaac, MD;  Location: [facility];  Service: Thoracic;  Laterality: N/A;  . PLEURAL EFFUSION DRAINAGE Right 03/27/2014   Procedure: DRAINAGE OF PLEURAL EFFUSION;  Surgeon: Grace Isaac, MD;  Location: [facility];  Service: Thoracic;  Laterality: Right;  Drainage of right pleural effusion  . SUBXYPHOID PERICARDIAL WINDOW N/A 03/27/2014   Procedure: SUBXYPHOID PERICARDIAL WINDOW;  Surgeon: Grace Isaac, MD;  Location: [facility];  Service: Thoracic;  Laterality: N/A;  . TOTAL LAPAROSCOPIC HYSTERECTOMY WITH SALPINGECTOMY Bilateral 11/26/2017   Procedure: TOTAL LAPAROSCOPIC HYSTERECTOMY WITH BILATERAL SALPINGECTOMY LEFT OOPHERECTOMY;  Surgeon: Sherlyn Hay, DO;  Location: [facility];  Service: Gynecology;  Laterality: Bilateral;  . TUBAL LIGATION       SOCIAL HISTORY:  Social History   Socioeconomic History  . Marital status: Married    Spouse name: Not on file  . Number of children: 3  . Years of education: Not on file  . Highest education level: Not on file  Occupational History  . Occupation: Surveyor, quantity: [employer]  Tobacco Use  . Smoking status: Never Smoker  .  Smokeless tobacco: Never Used  Substance and Sexual Activity  . Alcohol use: No    Alcohol/week: 0.0 standard drinks  . Drug use: No  . Sexual activity: Not on file  Other Topics Concern  . Not on file  Social History Narrative  . Not on file   Social Determinants of Health   Financial Resource Strain:     . Difficulty of Paying Living Expenses:   Food Insecurity:   . Worried About Charity fundraiser in the Last Year:   . Arboriculturist in the Last Year:   Transportation Needs:   . Film/video editor (Medical):   Marland Kitchen Lack of Transportation (Non-Medical):   Physical Activity:   . Days of Exercise per Week:   . Minutes of Exercise per Session:   Stress:   . Feeling of Stress :   Social Connections:   . Frequency of Communication with Friends and Family:   . Frequency of Social Gatherings with Friends and Family:   . Attends Religious Services:   . Active Member of Clubs or Organizations:   . Attends Archivist Meetings:   Marland Kitchen Marital Status:   Intimate Partner Violence:   . Fear of Current or Ex-Partner:   . Emotionally Abused:   Marland Kitchen Physically Abused:   . Sexually Abused:     FAMILY HISTORY:  Family History  Problem Relation Age of Onset  . Diabetes Mother   . Heart disease Mother   . Colon cancer Maternal Grandmother   . Brain cancer Brother   . Crohn's disease Brother     CURRENT MEDICATIONS:  Outpatient Encounter Medications as of 03/31/2019  Medication Sig  . Insulin Degludec (TRESIBA FLEXTOUCH) 200 UNIT/ML SOPN Inject 76 Units into the skin daily.  . metFORMIN (GLUCOPHAGE-XR) 500 MG 24 hr tablet Take 1 tablet (500 mg total) by mouth 2 (two) times daily.  . rosuvastatin (CRESTOR) 10 MG tablet Take 1 tablet (10 mg total) by mouth daily.  Marland Kitchen warfarin (COUMADIN) 5 MG tablet Take 0.5-1 tablets (2.5-5 mg total) by mouth See admin instructions. Take 2.5 mg by mouth daily except take 5 mg by mouth on Monday, Wednesday and Friday   No facility-administered encounter medications on file as of 03/31/2019.    ALLERGIES:  Allergies  Allergen Reactions  . Glipizide Nausea And Vomiting and Other (See Comments)    GI upset  . Metoprolol Other (See Comments)    "Made me sick"  . Shellfish Allergy Swelling    Throat and eyes were swollen.  Had difficulty breathing.   Was hospitalized.       PHYSICAL EXAM:  ECOG Performance status: 1  Vitals:   03/31/19 0805  BP: 120/68  Pulse: 74  Resp: 18  Temp: (!) 97.1 F (36.2 C)  SpO2: 98%   Filed Weights   03/31/19 0805  Weight: 241 lb (109.3 kg)    Physical Exam Constitutional:      Appearance: Normal appearance.  Cardiovascular:     Rate and Rhythm: Normal rate and regular rhythm.     Heart sounds: Normal heart sounds.  Pulmonary:     Effort: Pulmonary effort is normal.     Breath sounds: Normal breath sounds.  Abdominal:     General: Bowel sounds are normal. There is no distension.     Palpations: Abdomen is soft.  Musculoskeletal:        General: No swelling.  Skin:    General: Skin is warm.  Neurological:  General: No focal deficit present.     Mental Status: She is alert and oriented to person, place, and time.  Psychiatric:        Mood and Affect: Mood normal.        Behavior: Behavior normal.      LABORATORY DATA:  I have reviewed the labs as listed.  CBC    Component Value Date/Time   WBC 5.7 03/24/2019 1010   RBC 4.71 03/24/2019 1010   HGB 13.0 03/24/2019 1010   HGB 11.9 02/26/2018 1544   HCT 41.3 03/24/2019 1010   HCT 37.1 02/26/2018 1544   PLT 100 (L) 03/24/2019 1010   PLT 151 02/26/2018 1544   MCV 87.7 03/24/2019 1010   MCV 78 (L) 02/26/2018 1544   MCH 27.6 03/24/2019 1010   MCHC 31.5 03/24/2019 1010   RDW 14.6 03/24/2019 1010   RDW 16.0 (H) 02/26/2018 1544   LYMPHSABS 1.5 03/24/2019 1010   LYMPHSABS 1.8 02/26/2018 1544   MONOABS 0.3 03/24/2019 1010   EOSABS 0.1 03/24/2019 1010   EOSABS 0.2 02/26/2018 1544   BASOSABS 0.0 03/24/2019 1010   BASOSABS 0.1 02/26/2018 1544   CMP Latest Ref Rng & Units 03/24/2019 06/01/2018 03/05/2018  Glucose 70 - 99 mg/dL 250(H) 308(H) 267(H)  BUN 6 - 20 mg/dL 10 13 10   Creatinine 0.44 - 1.00 mg/dL 0.70 0.73 0.63  Sodium 135 - 145 mmol/L 136 135 134(L)  Potassium 3.5 - 5.1 mmol/L 3.7 4.0 4.1  Chloride 98 - 111 mmol/L  102 100 100  CO2 22 - 32 mmol/L 26 21 26   Calcium 8.9 - 10.3 mg/dL 8.9 9.5 9.0  Total Protein 6.5 - 8.1 g/dL 6.9 7.0 7.6  Total Bilirubin 0.3 - 1.2 mg/dL 1.0 0.6 0.5  Alkaline Phos 38 - 126 U/L 60 66 76  AST 15 - 41 U/L 26 33 31  ALT 0 - 44 U/L 24 24 30        DIAGNOSTIC IMAGING:  I have independently reviewed the scans and discussed with the patient.   I have reviewed Venita Lick LPN's note and agree with the documentation.  I personally performed a face-to-face visit, made revisions and my assessment and plan is as follows.    ASSESSMENT & PLAN:   No problem-specific Assessment & Plan notes found for this encounter.      Orders placed this encounter:  No orders of the defined types were placed in this encounter.     Derek Jack, MD [place] [phone]

## 2019-03-31 NOTE — Patient Instructions (Signed)
[facility] at [facility] Discharge Instructions  You were seen today by Dr. Katragadda. He went over your recent lab results. He will see you back in 6 months for labs and follow up.   Thank you for choosing [facility] at [facility] to provide your oncology and hematology care.  To afford each patient quality time with our provider, please arrive at least 15 minutes before your scheduled appointment time.   If you have a lab appointment with the Cancer Center please come in thru the  Main Entrance and check in at the main information desk  You need to re-schedule your appointment should you arrive 10 or more minutes late.  We strive to give you quality time with our providers, and arriving late affects you and other patients whose appointments are after yours.  Also, if you no show three or more times for appointments you may be dismissed from the clinic at the providers discretion.     Again, thank you for choosing [facility].  Our hope is that these requests will decrease the amount of time that you wait before being seen by our physicians.       _____________________________________________________________  Should you have questions after your visit to [facility], please contact our office at [phone] between the hours of 8:00 a.m. and 4:30 p.m.  Voicemails left after 4:00 p.m. will not be returned until the following business day.  For prescription refill requests, have your pharmacy contact our office and allow 72 hours.    Cancer Center Support Programs:   > Cancer Support Group  2nd Tuesday of the month 1pm-2pm, Journey Room    

## 2019-04-03 ENCOUNTER — Encounter (HOSPITAL_COMMUNITY): Payer: Self-pay | Admitting: Hematology

## 2019-04-03 NOTE — Assessment & Plan Note (Signed)
1.  Intermittent thrombocytopenia: -Mild to moderate thrombocytopenia since 2016, when she had her AVR with mechanical valve. -She is on Coumadin and denies any easy bruising or bleeding. -Nutritional deficiency work-up, hepatitis panel was negative.  ANA was positive with speckled pattern with elevation of 2320.  She does not have any clinical signs or symptoms of lupus. -We reviewed her labs.  Hemoglobin and white count is normal.  Platelet count is 100.  Platelet count in September 2020 was 97. -We will continue to monitor her platelet count at 54-month intervals.  2.  Microcytic anemia: -This has resolved.  Hemoglobin is normal.  Ferritin is 24, slightly decreased from 28 last time. -She denies any bleeding per rectum or melena.

## 2019-04-07 DIAGNOSIS — Z23 Encounter for immunization: Secondary | ICD-10-CM | POA: Diagnosis not present

## 2019-04-08 ENCOUNTER — Other Ambulatory Visit: Payer: Self-pay | Admitting: Nurse Practitioner

## 2019-04-08 DIAGNOSIS — Z7901 Long term (current) use of anticoagulants: Secondary | ICD-10-CM

## 2019-05-05 DIAGNOSIS — Z23 Encounter for immunization: Secondary | ICD-10-CM | POA: Diagnosis not present

## 2019-05-05 DIAGNOSIS — E1165 Type 2 diabetes mellitus with hyperglycemia: Secondary | ICD-10-CM | POA: Diagnosis not present

## 2019-05-05 DIAGNOSIS — Z952 Presence of prosthetic heart valve: Secondary | ICD-10-CM | POA: Diagnosis not present

## 2019-05-12 DIAGNOSIS — Z6835 Body mass index (BMI) 35.0-35.9, adult: Secondary | ICD-10-CM | POA: Diagnosis not present

## 2019-05-12 DIAGNOSIS — Z952 Presence of prosthetic heart valve: Secondary | ICD-10-CM | POA: Diagnosis not present

## 2019-05-12 DIAGNOSIS — D696 Thrombocytopenia, unspecified: Secondary | ICD-10-CM | POA: Diagnosis not present

## 2019-05-12 DIAGNOSIS — E1165 Type 2 diabetes mellitus with hyperglycemia: Secondary | ICD-10-CM | POA: Diagnosis not present

## 2019-06-21 ENCOUNTER — Other Ambulatory Visit: Payer: Self-pay | Admitting: Nurse Practitioner

## 2019-06-21 DIAGNOSIS — I10 Essential (primary) hypertension: Secondary | ICD-10-CM

## 2019-08-06 DIAGNOSIS — D696 Thrombocytopenia, unspecified: Secondary | ICD-10-CM | POA: Diagnosis not present

## 2019-08-06 DIAGNOSIS — E1165 Type 2 diabetes mellitus with hyperglycemia: Secondary | ICD-10-CM | POA: Diagnosis not present

## 2019-08-06 DIAGNOSIS — Z13220 Encounter for screening for lipoid disorders: Secondary | ICD-10-CM | POA: Diagnosis not present

## 2019-08-30 DIAGNOSIS — Z1389 Encounter for screening for other disorder: Secondary | ICD-10-CM | POA: Diagnosis not present

## 2019-08-30 DIAGNOSIS — R768 Other specified abnormal immunological findings in serum: Secondary | ICD-10-CM | POA: Diagnosis not present

## 2019-08-30 DIAGNOSIS — Z1331 Encounter for screening for depression: Secondary | ICD-10-CM | POA: Diagnosis not present

## 2019-08-30 DIAGNOSIS — E1165 Type 2 diabetes mellitus with hyperglycemia: Secondary | ICD-10-CM | POA: Diagnosis not present

## 2019-08-30 DIAGNOSIS — Z952 Presence of prosthetic heart valve: Secondary | ICD-10-CM | POA: Diagnosis not present

## 2019-09-28 ENCOUNTER — Inpatient Hospital Stay (HOSPITAL_COMMUNITY): Payer: BC Managed Care – PPO | Attending: Hematology

## 2019-10-05 ENCOUNTER — Ambulatory Visit (HOSPITAL_COMMUNITY): Payer: BC Managed Care – PPO | Admitting: Hematology

## 2020-01-04 DIAGNOSIS — Z952 Presence of prosthetic heart valve: Secondary | ICD-10-CM | POA: Diagnosis not present

## 2020-01-04 DIAGNOSIS — D649 Anemia, unspecified: Secondary | ICD-10-CM | POA: Diagnosis not present

## 2020-01-04 DIAGNOSIS — E1165 Type 2 diabetes mellitus with hyperglycemia: Secondary | ICD-10-CM | POA: Diagnosis not present

## 2020-01-24 NOTE — Progress Notes (Deleted)
CARDIOLOGY OFFICE NOTE  Date:  01/24/2020    Anna Salinas Date of Birth: 09/02/1970 Medical Record #[id number]  PCP:  [facility]  Cardiologist:  Anna Stanczak Martinique  MD  No chief complaint on file.    History of Present Illness: Anna Salinas is a 50 y.o. female who presents for follow up AVR.   She has a history of AVR in 02/2014 for severe AS with a bicuspid AV. She is on chronic coumadin.  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 These were drained. Repeat CXR and Echo in April 2016 showed resolution of effusions and normal valve function.   In Dec 2017 she was complaining of fatigue with periods of exhaustion. No specific cardiac cause found. Felt to be related to poorly controlled DM with A1c 10.5%. She was started on insulin along with metformin.   She was admitted 2019 with severe anemia related to Dysfunctional uterine bleeding. Hgb down to 6.6. Transfused 2 units PRBCs and Hgb improved to 8.8. She did have placement of IUD in April to try and help with the heavy menses. Unable to stop Coumadin due to mechanical AV.  During hospitalization she was noted to have elevated troponin to 0.48. This was felt to be related to demand ischemia secondary to anemia.   On November 13 she underwent laparoscopic total hysterectomy for definitive treatment of her uterine bleeding.   She had elbow surgery in July 2020 after she fell and fractured it.   She reports she is doing well. She states her labs showed low platelet count in September. Doctors are concerned she may have lupus. No bleeding issues. No rash or joint pain. INR checks have been OK.   Past Medical History:  Diagnosis Date  . Aortic stenosis due to bicuspid aortic valve   . Blood transfusion without reported diagnosis   . Essential hypertension, benign   . Palpitations   . S/P laparoscopic hysterectomy 11/26/2017  . Type 2 diabetes mellitus ([place])      Past Surgical History:  Procedure Laterality Date  . AORTIC VALVE REPLACEMENT N/A 02/14/2014   Procedure: AORTIC VALVE REPLACEMENT (AVR);  Surgeon: Anna Pollack, MD;  Location: [facility];  Service: Open Heart Surgery;  Laterality: N/A;  . CARDIAC CATHETERIZATION    . CESAREAN SECTION  2009  . CHEST TUBE INSERTION Right 03/27/2014   Procedure: CHEST TUBE INSERTION;  Surgeon: Anna Isaac, MD;  Location: [facility] [facility];  Service: Thoracic;  Laterality: Right;  . CYSTOSCOPY N/A 11/26/2017   Procedure: CYSTOSCOPY;  Surgeon: Anna Hay, DO;  Location: [facility];  Service: Gynecology;  Laterality: N/A;  . INTRAOPERATIVE TRANSESOPHAGEAL ECHOCARDIOGRAM N/A 02/14/2014   Procedure: INTRAOPERATIVE TRANSESOPHAGEAL ECHOCARDIOGRAM;  Surgeon: Anna Pollack, MD;  Location: [facility];  Service: Open Heart Surgery;  Laterality: N/A;  . LEFT AND RIGHT HEART CATHETERIZATION WITH CORONARY ANGIOGRAM N/A 01/25/2014   Procedure: LEFT AND RIGHT HEART CATHETERIZATION WITH CORONARY ANGIOGRAM;  Surgeon: Kaidan Harpster M Martinique, MD;  Location[facility];  Service: Cardiovascular;  Laterality: N/A;  . PERICARDIAL FLUID DRAINAGE N/A 03/27/2014   Procedure: DRAINAGE OF PERICARDIAL FLUID;  Surgeon: Anna Isaac, MD;  Location: [facility];  Service: Thoracic;  Laterality: N/A;  . PLEURAL EFFUSION DRAINAGE Right 03/27/2014   Procedure: DRAINAGE OF PLEURAL EFFUSION;  Surgeon: Anna Isaac, MD;  Location: [facility];  Service: Thoracic;  Laterality: Right;  Drainage of right pleural effusion  . SUBXYPHOID PERICARDIAL WINDOW  N/A 03/27/2014   Procedure: SUBXYPHOID PERICARDIAL WINDOW;  Surgeon: Anna Isaac, MD;  Location: [facility];  Service: Thoracic;  Laterality: N/A;  . TOTAL LAPAROSCOPIC HYSTERECTOMY WITH SALPINGECTOMY Bilateral 11/26/2017   Procedure: TOTAL LAPAROSCOPIC HYSTERECTOMY WITH BILATERAL SALPINGECTOMY LEFT OOPHERECTOMY;  Surgeon: Anna Hay, DO;  Location: [facility];  Service: Gynecology;  Laterality: Bilateral;  .  TUBAL LIGATION       Medications: Current Outpatient Medications  Medication Sig Dispense Refill  . Insulin Degludec (TRESIBA FLEXTOUCH) 200 UNIT/ML SOPN Inject 76 Units into the skin daily. 5 pen 5  . metFORMIN (GLUCOPHAGE-XR) 500 MG 24 hr tablet Take 1 tablet (500 mg total) by mouth 2 (two) times daily. 180 tablet 1  . rosuvastatin (CRESTOR) 10 MG tablet Take 1 tablet (10 mg total) by mouth daily. 90 tablet 3  . warfarin (COUMADIN) 5 MG tablet Take 0.5-1 tablets (2.5-5 mg total) by mouth See admin instructions. Take 2.5 mg by mouth daily except take 5 mg by mouth on Monday, Wednesday and Friday 30 tablet 5   No current facility-administered medications for this visit.    Allergies: Allergies  Allergen Reactions  . Glipizide Nausea And Vomiting and Other (See Comments)    GI upset  . Metoprolol Other (See Comments)    "Made me sick"  . Shellfish Allergy Swelling    Throat and eyes were swollen.  Had difficulty breathing.  Was hospitalized.      Social History: The patient  reports that she has never smoked. She has never used smokeless tobacco. She reports that she does not drink alcohol and does not use drugs.   Family History: The patient's family history includes Brain cancer in her brother; Colon cancer in her maternal grandmother; Crohn's disease in her brother; Diabetes in her mother; Heart disease in her mother.   Review of Systems: Please see the history of present illness.  Glucose has been elevated.  All other systems are reviewed and negative.   Physical Exam: VS:  LMP 04/14/2017  .  BMI There is no height or weight on file to calculate BMI.  Wt Readings from Last 3 Encounters:  03/31/19 241 lb (109.3 kg)  02/01/19 237 lb 3.2 oz (107.6 kg)  10/01/18 235 lb 4.8 oz (106.7 kg)   GENERAL:  Well appearing obese WF in NAD HEENT:  PERRL, EOMI, sclera are clear. Oropharynx is clear. NECK:  No jugular venous distention, carotid upstroke brisk and symmetric, no bruits,  no thyromegaly or adenopathy LUNGS:  Clear to auscultation bilaterally CHEST:  Unremarkable HEART:  RRR,  PMI not displaced or sustained,good mechanical S2. , no S3, no S4: no clicks, no rubs, no murmurs ABD:  Soft, nontender. BS +, no masses or bruits. No hepatomegaly, no splenomegaly EXT:  2 + pulses throughout, no edema, no cyanosis no clubbing SKIN:  Warm and dry.  No rashes NEURO:  Alert and oriented x 3. Cranial nerves II through XII intact. PSYCH:  Cognitively intact    LABORATORY DATA:  EKG:  EKG is ordered today. NSR rate 63. Normal Ecg. I have personally reviewed and interpreted this study.   Lab Results  Component Value Date   WBC 5.7 03/24/2019   HGB 13.0 03/24/2019   HCT 41.3 03/24/2019   PLT 100 (L) 03/24/2019   GLUCOSE 250 (H) 03/24/2019   CHOL 102 06/01/2018   TRIG 117 06/01/2018   HDL 32 (L) 06/01/2018   LDLCALC 47 06/01/2018   ALT 24 03/24/2019   AST 26  03/24/2019   NA 136 03/24/2019   K 3.7 03/24/2019   CL 102 03/24/2019   CREATININE 0.70 03/24/2019   BUN 10 03/24/2019   CO2 26 03/24/2019   TSH 0.93 01/03/2016   INR 4.0 (H) 07/24/2018   HGBA1C 9.3 (H) 07/24/2018   MICROALBUR negative 04/14/2014   Dated 11/10/18: A1c 9.1%, CBC and CMET normal.  Cholesterol 97, triglycerides 98, HDL 33, LDL 47.   BNP (last 3 results) No results for input(s): BNP in the last 8760 hours.  ProBNP (last 3 results) No results for input(s): PROBNP in the last 8760 hours.   Other Studies Reviewed Today:   Echo: 03/26/2014 Study Conclusions - Left ventricle: The cavity size was normal. Wall thickness was normal. Systolic function was vigorous. The estimated ejection fraction was in the range of 65% to 70%. Wall motion was normal; there were no regional wall motion abnormalities. - Aortic valve: A [employer] mechanical prosthesis was present. No obvious perivalvular leak based on limited views. There was no significant regurgitation. Mean gradient  (S): 15 mm Hg. Peak gradient (S): 28 mm Hg. - Right ventricle: The cavity size was below normal. - Tricuspid valve: There was trivial regurgitation. - Pulmonary arteries: Systolic pressure could not be accurately estimated. - Pericardium, extracardiac: A large pericardial effusion was identified circumferential to the heart, measures approximately 4 cm posteriorly and 3 cm anteriorly. There is compression of the right ventricle noted in the subcostal view and further evidence of tamponade physiology based on respiratory variation in mitral inflow. There was a right pleural effusion. Impressions: - Normal LV wall thickness with LVEF 65-70%. St. Jude mechanical AVR without obvious perivalvular leak based on limited and with grossly normal gradients. A large pericardial effusion was identified circumferential to the heart, measures approximately 4 cm posteriorly and 3-4 cm anteriorly. There is compression of the right ventricle noted in the subcostal view and further evidence of tamponade physiology based on respiratory variation in mitral inflow. Right pleural effusion also noted. Results discussed with Dr. Servando Snare.  Echo 06/19/17: Study Conclusions  - Left ventricle: The cavity size was normal. Systolic function was   normal. The estimated ejection fraction was in the range of 60%   to 65%. Wall motion was normal; there were no regional wall   motion abnormalities. Left ventricular diastolic function   parameters were normal. - Aortic valve: There was very mild stenosis. - Right ventricle: The cavity size was normal. Wall thickness was   normal. Systolic function was normal. - Right atrium: The atrium was normal in size. - Tricuspid valve: Structurally normal valve. There was mild   regurgitation. - Pulmonary arteries: The main pulmonary artery was normal-sized.   Systolic pressure was within the normal range. - Inferior vena cava: The vessel was  normal in size. - Pericardium, extracardiac: There was no pericardial effusion.  Impressions:  - S/P AVR with a mechanical valve. Leaflets are not well   visualized. Transaortic gradients are slightly higher than on a   prior study from 04/15/2014 now peak/mean 38/19 mmHg, peak velocity   3.1 m/s, but still within upper normal range for this type of   prosthesis.  Assessment/Plan: 1. S/p AVR. continue long term coumadin. Reviewed recommendations for SBE prophylaxis. Valve click is normal today.  2. DM-  Per primary care. On insulin.  3. Chronic coumadin therapy. INR monitored by primary care.   4. Thrombocytopenia. Followed by primary care.    Disposition:   FU with me in one  year  Signed: Aleasha Fregeau Martinique MD, [facility]    01/24/2020 1:32 PM

## 2020-01-27 ENCOUNTER — Ambulatory Visit: Payer: BC Managed Care – PPO | Admitting: Cardiology

## 2020-04-07 NOTE — Progress Notes (Deleted)
CARDIOLOGY OFFICE NOTE  Date:  04/07/2020    Anna Salinas Date of Birth: [birth year] Medical Record #[id number]  PCP:  [facility]  Cardiologist:  Jalik Gellatly Martinique  MD  No chief complaint on file.    History of Present Illness: Anna Salinas is a 50 y.o. female who presents for follow up AVR.   She has a history of AVR in 02/2014 for severe AS with a bicuspid AV. She is on chronic coumadin.  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 These were drained. Repeat CXR and Echo in April 2016 showed resolution of effusions and normal valve function.   In Dec 2017 she was complaining of fatigue with periods of exhaustion. No specific cardiac cause found. Felt to be related to poorly controlled DM with A1c 10.5%. She was started on insulin along with metformin.   She was admitted 2019 with severe anemia related to Dysfunctional uterine bleeding. Hgb down to 6.6. Transfused 2 units PRBCs and Hgb improved to 8.8. She did have placement of IUD in April to try and help with the heavy menses. Unable to stop Coumadin due to mechanical AV.  During hospitalization she was noted to have elevated troponin to 0.48. This was felt to be related to demand ischemia secondary to anemia.   On November 13 she underwent laparoscopic total hysterectomy for definitive treatment of her uterine bleeding.   She had elbow surgery in July 2020 after she fell and fractured it.   She reports she is doing well. She states her labs showed low platelet count in September. Doctors are concerned she may have lupus. No bleeding issues. No rash or joint pain. INR checks have been OK.   Past Medical History:  Diagnosis Date  . Aortic stenosis due to bicuspid aortic valve   . Blood transfusion without reported diagnosis   . Essential hypertension, benign   . Palpitations   . S/P laparoscopic hysterectomy 11/26/2017  . Type 2 diabetes mellitus ([place])      Past Surgical History:  Procedure Laterality Date  . AORTIC VALVE REPLACEMENT N/A 02/14/2014   Procedure: AORTIC VALVE REPLACEMENT (AVR);  Surgeon: Gaye Pollack, MD;  Location: [place];  Service: Open Heart Surgery;  Laterality: N/A;  . CARDIAC CATHETERIZATION    . CESAREAN SECTION  2009  . CHEST TUBE INSERTION Right 03/27/2014   Procedure: CHEST TUBE INSERTION;  Surgeon: Grace Isaac, MD;  Location: [place];  Service: Thoracic;  Laterality: Right;  . CYSTOSCOPY N/A 11/26/2017   Procedure: CYSTOSCOPY;  Surgeon: Sherlyn Hay, DO;  Location: [facility];  Service: Gynecology;  Laterality: N/A;  . INTRAOPERATIVE TRANSESOPHAGEAL ECHOCARDIOGRAM N/A 02/14/2014   Procedure: INTRAOPERATIVE TRANSESOPHAGEAL ECHOCARDIOGRAM;  Surgeon: Gaye Pollack, MD;  Location: [place] [facility];  Service: Open Heart Surgery;  Laterality: N/A;  . LEFT AND RIGHT HEART CATHETERIZATION WITH CORONARY ANGIOGRAM N/A 01/25/2014   Procedure: LEFT AND RIGHT HEART CATHETERIZATION WITH CORONARY ANGIOGRAM;  Surgeon: Devaris Quirk M Martinique, MD;  Location: [facility] C[facility] LAB;  Service: Cardiovascular;  Laterality: N/A;  . PERICARDIAL FLUID DRAINAGE N/A 03/27/2014   Procedure: DRAINAGE OF PERICARDIAL FLUID;  Surgeon: Grace Isaac, MD;  Location: [place];  Service: Thoracic;  Laterality: N/A;  . PLEURAL EFFUSION DRAINAGE Right 03/27/2014   Procedure: DRAINAGE OF PLEURAL EFFUSION;  Surgeon: Grace Isaac, MD;  Location: [place];  Service: Thoracic;  Laterality: Right;  Drainage of right pleural effusion  . SUBXYPHOID PERICARDIAL WINDOW  N/A 03/27/2014   Procedure: SUBXYPHOID PERICARDIAL WINDOW;  Surgeon: Grace Isaac, MD;  Location: [place];  Service: Thoracic;  Laterality: N/A;  . TOTAL LAPAROSCOPIC HYSTERECTOMY WITH SALPINGECTOMY Bilateral 11/26/2017   Procedure: TOTAL LAPAROSCOPIC HYSTERECTOMY WITH BILATERAL SALPINGECTOMY LEFT OOPHERECTOMY;  Surgeon: Sherlyn Hay, DO;  Location: [facility];  Service: Gynecology;  Laterality: Bilateral;  .  TUBAL LIGATION       Medications: Current Outpatient Medications  Medication Sig Dispense Refill  . Insulin Degludec (TRESIBA FLEXTOUCH) 200 UNIT/ML SOPN Inject 76 Units into the skin daily. 5 pen 5  . metFORMIN (GLUCOPHAGE-XR) 500 MG 24 hr tablet Take 1 tablet (500 mg total) by mouth 2 (two) times daily. 180 tablet 1  . rosuvastatin (CRESTOR) 10 MG tablet Take 1 tablet (10 mg total) by mouth daily. 90 tablet 3  . warfarin (COUMADIN) 5 MG tablet Take 0.5-1 tablets (2.5-5 mg total) by mouth See admin instructions. Take 2.5 mg by mouth daily except take 5 mg by mouth on Monday, Wednesday and Friday 30 tablet 5   No current facility-administered medications for this visit.    Allergies: Allergies  Allergen Reactions  . Glipizide Nausea And Vomiting and Other (See Comments)    GI upset  . Metoprolol Other (See Comments)    "Made me sick"  . Shellfish Allergy Swelling    Throat and eyes were swollen.  Had difficulty breathing.  Was hospitalized.      Social History: The patient  reports that she has never smoked. She has never used smokeless tobacco. She reports that she does not drink alcohol and does not use drugs.   Family History: The patient's family history includes Brain cancer in her brother; Colon cancer in her maternal grandmother; Crohn's disease in her brother; Diabetes in her mother; Heart disease in her mother.   Review of Systems: Please see the history of present illness.  Glucose has been elevated.  All other systems are reviewed and negative.   Physical Exam: VS:  LMP 04/14/2017  .  BMI There is no height or weight on file to calculate BMI.  Wt Readings from Last 3 Encounters:  03/31/19 241 lb (109.3 kg)  02/01/19 237 lb 3.2 oz (107.6 kg)  10/01/18 235 lb 4.8 oz (106.7 kg)   GENERAL:  Well appearing obese WF in NAD HEENT:  PERRL, EOMI, sclera are clear. Oropharynx is clear. NECK:  No jugular venous distention, carotid upstroke brisk and symmetric, no bruits,  no thyromegaly or adenopathy LUNGS:  Clear to auscultation bilaterally CHEST:  Unremarkable HEART:  RRR,  PMI not displaced or sustained,good mechanical S2. , no S3, no S4: no clicks, no rubs, no murmurs ABD:  Soft, nontender. BS +, no masses or bruits. No hepatomegaly, no splenomegaly EXT:  2 + pulses throughout, no edema, no cyanosis no clubbing SKIN:  Warm and dry.  No rashes NEURO:  Alert and oriented x 3. Cranial nerves II through XII intact. PSYCH:  Cognitively intact    LABORATORY DATA:  EKG:  EKG is ordered today. NSR rate 63. Normal Ecg. I have personally reviewed and interpreted this study.   Lab Results  Component Value Date   WBC 5.7 03/24/2019   HGB 13.0 03/24/2019   HCT 41.3 03/24/2019   PLT 100 (L) 03/24/2019   GLUCOSE 250 (H) 03/24/2019   CHOL 102 06/01/2018   TRIG 117 06/01/2018   HDL 32 (L) 06/01/2018   LDLCALC 47 06/01/2018   ALT 24 03/24/2019   AST 26  03/24/2019   NA 136 03/24/2019   K 3.7 03/24/2019   CL 102 03/24/2019   CREATININE 0.70 03/24/2019   BUN 10 03/24/2019   CO2 26 03/24/2019   TSH 0.93 01/03/2016   INR 4.0 (H) 07/24/2018   HGBA1C 9.3 (H) 07/24/2018   MICROALBUR negative 04/14/2014   Dated 11/10/18: A1c 9.1%, CBC and CMET normal.  Cholesterol 97, triglycerides 98, HDL 33, LDL 47. Dated 08/06/19: cholesterol 111, triglycerides 100, HDL 30. Dated 01/04/20: A1c 8.2%. normal CBC, CMET, TSH  BNP (last 3 results) No results for input(s): BNP in the last 8760 hours.  ProBNP (last 3 results) No results for input(s): PROBNP in the last 8760 hours.   Other Studies Reviewed Today:   Echo: 03/26/2014 Study Conclusions - Left ventricle: The cavity size was normal. Wall thickness was normal. Systolic function was vigorous. The estimated ejection fraction was in the range of 65% to 70%. Wall motion was normal; there were no regional wall motion abnormalities. - Aortic valve: A [employer] mechanical prosthesis was present. No  obvious perivalvular leak based on limited views. There was no significant regurgitation. Mean gradient (S): 15 mm Hg. Peak gradient (S): 28 mm Hg. - Right ventricle: The cavity size was below normal. - Tricuspid valve: There was trivial regurgitation. - Pulmonary arteries: Systolic pressure could not be accurately estimated. - Pericardium, extracardiac: A large pericardial effusion was identified circumferential to the heart, measures approximately 4 cm posteriorly and 3 cm anteriorly. There is compression of the right ventricle noted in the subcostal view and further evidence of tamponade physiology based on respiratory variation in mitral inflow. There was a right pleural effusion. Impressions: - Normal LV wall thickness with LVEF 65-70%. St. Jude mechanical AVR without obvious perivalvular leak based on limited and with grossly normal gradients. A large pericardial effusion was identified circumferential to the heart, measures approximately 4 cm posteriorly and 3-4 cm anteriorly. There is compression of the right ventricle noted in the subcostal view and further evidence of tamponade physiology based on respiratory variation in mitral inflow. Right pleural effusion also noted. Results discussed with Dr. Servando Snare.  Echo 06/19/17: Study Conclusions  - Left ventricle: The cavity size was normal. Systolic function was   normal. The estimated ejection fraction was in the range of 60%   to 65%. Wall motion was normal; there were no regional wall   motion abnormalities. Left ventricular diastolic function   parameters were normal. - Aortic valve: There was very mild stenosis. - Right ventricle: The cavity size was normal. Wall thickness was   normal. Systolic function was normal. - Right atrium: The atrium was normal in size. - Tricuspid valve: Structurally normal valve. There was mild   regurgitation. - Pulmonary arteries: The main pulmonary artery  was normal-sized.   Systolic pressure was within the normal range. - Inferior vena cava: The vessel was normal in size. - Pericardium, extracardiac: There was no pericardial effusion.  Impressions:  - S/P AVR with a mechanical valve. Leaflets are not well   visualized. Transaortic gradients are slightly higher than on a   prior study from 04/15/2014 now peak/mean 38/19 mmHg, peak velocity   3.1 m/s, but still within upper normal range for this type of   prosthesis.  Assessment/Plan: 1. S/p AVR. continue long term coumadin. Reviewed recommendations for SBE prophylaxis. Valve click is normal today.  2. DM-  Per primary care. On insulin.  3. Chronic coumadin therapy. INR monitored by primary care.   4. Thrombocytopenia.  Followed by primary care.    Disposition:   FU with me in one year  Signed: Matan Steen Martin[place] MD, [facility]    04/07/2020 7:32 AM

## 2020-04-10 ENCOUNTER — Ambulatory Visit: Payer: BC Managed Care – PPO | Admitting: Cardiology

## 2020-06-02 NOTE — Progress Notes (Signed)
CARDIOLOGY OFFICE NOTE  Date:  06/05/2020    Anna Salinas Date of Birth: May 04, 1970 Medical Record #[id number]  PCP:  [facility]  Cardiologist:  Pj Zehner [place]  MD  No chief complaint on file.    History of Present Illness: Anna Salinas is a 50 y.o. female who presents for follow up AVR.   She has a history of AVR in 02/2014 for severe AS with a bicuspid AV. She is on chronic coumadin.  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 These were drained. Repeat CXR and Echo in April 2016 showed resolution of effusions and normal valve function.   In Dec 2017 she was complaining of fatigue with periods of exhaustion. No specific cardiac cause found. Felt to be related to poorly controlled DM with A1c 10.5%. She was started on insulin along with metformin.   She was admitted 2019 with severe anemia related to Dysfunctional uterine bleeding. Hgb down to 6.6. Transfused 2 units PRBCs and Hgb improved to 8.8. She did have placement of IUD in April to try and help with the heavy menses. Unable to stop Coumadin due to mechanical AV.  During hospitalization she was noted to have elevated troponin to 0.48. This was felt to be related to demand ischemia secondary to anemia.   On November 13 she underwent laparoscopic total hysterectomy for definitive treatment of her uterine bleeding.   She had elbow surgery in July 2020 after she fell and fractured it.   She reports she is doing well. She has thrombocytopenia and was evaluated by hematology.  No bleeding issues.  She is active with work. Notes some swelling in her ankles when she is standing for 8 hours. No palpitations, dizziness, SOB.   Past Medical History:  Diagnosis Date  . Aortic stenosis due to bicuspid aortic valve   . Blood transfusion without reported diagnosis   . Essential hypertension, benign   . Palpitations   . S/P laparoscopic hysterectomy 11/26/2017  . Type  2 diabetes mellitus ([place])     Past Surgical History:  Procedure Laterality Date  . AORTIC VALVE REPLACEMENT N/A 02/14/2014   Procedure: AORTIC VALVE REPLACEMENT (AVR);  Surgeon: Gaye Pollack, MD;  Location: [place];  Service: Open Heart Surgery;  Laterality: N/A;  . CARDIAC CATHETERIZATION    . CESAREAN SECTION  2009  . CHEST TUBE INSERTION Right 03/27/2014   Procedure: CHEST TUBE INSERTION;  Surgeon: Grace Isaac, MD;  Location: [place];  Service: Thoracic;  Laterality: Right;  . CYSTOSCOPY N/A 11/26/2017   Procedure: CYSTOSCOPY;  Surgeon: Sherlyn Hay, DO;  Location: [place] [facility];  Service: Gynecology;  Laterality: N/A;  . INTRAOPERATIVE TRANSESOPHAGEAL ECHOCARDIOGRAM N/A 02/14/2014   Procedure: INTRAOPERATIVE TRANSESOPHAGEAL ECHOCARDIOGRAM;  Surgeon: Gaye Pollack, MD;  Location: [place] [facility];  Service: Open Heart Surgery;  Laterality: N/A;  . LEFT AND RIGHT HEART CATHETERIZATION WITH CORONARY ANGIOGRAM N/A 01/25/2014   Procedure: LEFT AND RIGHT HEART CATHETERIZATION WITH CORONARY ANGIOGRAM;  Surgeon: Kristyanna Barcelo M Martinique, MD;  Location: [facility] C[facility] LAB;  Service: Cardiovascular;  Laterality: N/A;  . PERICARDIAL FLUID DRAINAGE N/A 03/27/2014   Procedure: DRAINAGE OF PERICARDIAL FLUID;  Surgeon: Grace Isaac, MD;  Location: [place];  Service: Thoracic;  Laterality: N/A;  . PLEURAL EFFUSION DRAINAGE Right 03/27/2014   Procedure: DRAINAGE OF PLEURAL EFFUSION;  Surgeon: Grace Isaac, MD;  Location: [place];  Service: Thoracic;  Laterality: Right;  Drainage of right pleural effusion  .  SUBXYPHOID PERICARDIAL WINDOW N/A 03/27/2014   Procedure: SUBXYPHOID PERICARDIAL WINDOW;  Surgeon: Grace Isaac, MD;  Location: [place];  Service: Thoracic;  Laterality: N/A;  . TOTAL LAPAROSCOPIC HYSTERECTOMY WITH SALPINGECTOMY Bilateral 11/26/2017   Procedure: TOTAL LAPAROSCOPIC HYSTERECTOMY WITH BILATERAL SALPINGECTOMY LEFT OOPHERECTOMY;  Surgeon: Sherlyn Hay, DO;  Location: [place] [facility];  Service: Gynecology;   Laterality: Bilateral;  . TUBAL LIGATION       Medications: Current Outpatient Medications  Medication Sig Dispense Refill  . B-D ULTRAFINE III SHORT PEN 31G X 8 MM MISC Inject into the skin daily.    . Insulin Degludec (TRESIBA FLEXTOUCH) 200 UNIT/ML SOPN Inject 76 Units into the skin daily. 5 pen 5  . metFORMIN (GLUCOPHAGE-XR) 500 MG 24 hr tablet Take 1 tablet (500 mg total) by mouth 2 (two) times daily. 180 tablet 1  . rosuvastatin (CRESTOR) 10 MG tablet Take 1 tablet (10 mg total) by mouth daily. 90 tablet 3  . warfarin (COUMADIN) 5 MG tablet Take 0.5-1 tablets (2.5-5 mg total) by mouth See admin instructions. Take 2.5 mg by mouth daily except take 5 mg by mouth on Monday, Wednesday and Friday 30 tablet 5   No current facility-administered medications for this visit.    Allergies: Allergies  Allergen Reactions  . Glipizide Nausea And Vomiting and Other (See Comments)    GI upset  . Metoprolol Other (See Comments)    "Made me sick"  . Shellfish Allergy Swelling    Throat and eyes were swollen.  Had difficulty breathing.  Was hospitalized.      Social History: The patient  reports that she has never smoked. She has never used smokeless tobacco. She reports that she does not drink alcohol and does not use drugs.   Family History: The patient's family history includes Brain cancer in her brother; Colon cancer in her maternal grandmother; Crohn's disease in her brother; Diabetes in her mother; Heart disease in her mother.   Review of Systems: Please see the history of present illness.  Glucose has been elevated.  All other systems are reviewed and negative.   Physical Exam: VS:  BP 122/72   Pulse 75   Ht 5\' 9"  (1.753 m)   Wt 240 lb 9.6 oz (109.1 kg)   LMP 04/14/2017   BMI 35.53 kg/m  .  BMI Body mass index is 35.53 kg/m.  Wt Readings from Last 3 Encounters:  06/05/20 240 lb 9.6 oz (109.1 kg)  03/31/19 241 lb (109.3 kg)  02/01/19 237 lb 3.2 oz (107.6 kg)   GENERAL:   Well appearing obese WF in NAD HEENT:  PERRL, EOMI, sclera are clear. Oropharynx is clear. NECK:  No jugular venous distention, carotid upstroke brisk and symmetric, no bruits, no thyromegaly or adenopathy LUNGS:  Clear to auscultation bilaterally CHEST:  Unremarkable HEART:  RRR,  PMI not displaced or sustained,good mechanical S2. , no S3, no S4: no clicks, no rubs, no murmurs ABD:  Soft, nontender. BS +, no masses or bruits. No hepatomegaly, no splenomegaly EXT:  2 + pulses throughout, no edema, no cyanosis no clubbing SKIN:  Warm and dry.  No rashes NEURO:  Alert and oriented x 3. Cranial nerves II through XII intact. PSYCH:  Cognitively intact    LABORATORY DATA:  EKG:  EKG is ordered today. NSR rate 75. Poor R wave progression. I have personally reviewed and interpreted this study.   Lab Results  Component Value Date   WBC 5.7 03/24/2019   HGB 13.0  03/24/2019   HCT 41.3 03/24/2019   PLT 100 (L) 03/24/2019   GLUCOSE 250 (H) 03/24/2019   CHOL 102 06/01/2018   TRIG 117 06/01/2018   HDL 32 (L) 06/01/2018   LDLCALC 47 06/01/2018   ALT 24 03/24/2019   AST 26 03/24/2019   NA 136 03/24/2019   K 3.7 03/24/2019   CL 102 03/24/2019   CREATININE 0.70 03/24/2019   BUN 10 03/24/2019   CO2 26 03/24/2019   TSH 0.93 01/03/2016   INR 4.0 (H) 07/24/2018   HGBA1C 9.3 (H) 07/24/2018   MICROALBUR negative 04/14/2014   Dated 11/10/18: A1c 9.1%, CBC and CMET normal.  Cholesterol 97, triglycerides 98, HDL 33, LDL 47. Dated 08/06/19: cholesterol 111, triglycerides 100, HDL 30, LDL 62.  Dated 01/06/20: A1c 8.2%. CMET normal.   BNP (last 3 results) No results for input(s): BNP in the last 8760 hours.  ProBNP (last 3 results) No results for input(s): PROBNP in the last 8760 hours.   Other Studies Reviewed Today:   Echo: 03/26/2014 Study Conclusions - Left ventricle: The cavity size was normal. Wall thickness was normal. Systolic function was vigorous. The estimated  ejection fraction was in the range of 65% to 70%. Wall motion was normal; there were no regional wall motion abnormalities. - Aortic valve: A [employer]. Jude [employer] mechanical prosthesis was present. No obvious perivalvular leak based on limited views. There was no significant regurgitation. Mean gradient (S): 15 mm Hg. Peak gradient (S): 28 mm Hg. - Right ventricle: The cavity size was below normal. - Tricuspid valve: There was trivial regurgitation. - Pulmonary arteries: Systolic pressure could not be accurately estimated. - Pericardium, extracardiac: A large pericardial effusion was identified circumferential to the heart, measures approximately 4 cm posteriorly and 3 cm anteriorly. There is compression of the right ventricle noted in the subcostal view and further evidence of tamponade physiology based on respiratory variation in mitral inflow. There was a right pleural effusion. Impressions: - Normal LV wall thickness with LVEF 65-70%. St. Jude mechanical AVR without obvious perivalvular leak based on limited and with grossly normal gradients. A large pericardial effusion was identified circumferential to the heart, measures approximately 4 cm posteriorly and 3-4 cm anteriorly. There is compression of the right ventricle noted in the subcostal view and further evidence of tamponade physiology based on respiratory variation in mitral inflow. Right pleural effusion also noted. Results discussed with Dr. Servando Snare.  Echo 06/19/17: Study Conclusions  - Left ventricle: The cavity size was normal. Systolic function was   normal. The estimated ejection fraction was in the range of 60%   to 65%. Wall motion was normal; there were no regional wall   motion abnormalities. Left ventricular diastolic function   parameters were normal. - Aortic valve: There was very mild stenosis. - Right ventricle: The cavity size was normal. Wall thickness was   normal.  Systolic function was normal. - Right atrium: The atrium was normal in size. - Tricuspid valve: Structurally normal valve. There was mild   regurgitation. - Pulmonary arteries: The main pulmonary artery was normal-sized.   Systolic pressure was within the normal range. - Inferior vena cava: The vessel was normal in size. - Pericardium, extracardiac: There was no pericardial effusion.  Impressions:  - S/P AVR with a mechanical valve. Leaflets are not well   visualized. Transaortic gradients are slightly higher than on a   prior study from 04/15/2014 now peak/mean 38/19 mmHg, peak velocity   3.1 m/s, but still within upper normal  range for this type of   prosthesis.  Assessment/Plan: 1. S/p AVR in 2016. Echo in 2019 showed normal prosthetic valve function. Continue long term coumadin. Reviewed recommendations for SBE prophylaxis. Valve click is normal today.  2. DM-  Per primary care. On insulin. Reports last A1c 6.5%.   3. Chronic coumadin therapy. INR monitored by primary care.   4. Thrombocytopenia.    Disposition:   FU with me in one year  Signed: Araiya Tilmon Martin[place] MD, [facility]    06/05/2020 3:33 PM

## 2020-06-05 ENCOUNTER — Other Ambulatory Visit: Payer: Self-pay

## 2020-06-05 ENCOUNTER — Ambulatory Visit (INDEPENDENT_AMBULATORY_CARE_PROVIDER_SITE_OTHER): Payer: BC Managed Care – PPO | Admitting: Cardiology

## 2020-06-05 ENCOUNTER — Encounter: Payer: Self-pay | Admitting: Cardiology

## 2020-06-05 VITALS — BP 122/72 | HR 75 | Ht 69.0 in | Wt 240.6 lb

## 2020-06-05 DIAGNOSIS — Z7901 Long term (current) use of anticoagulants: Secondary | ICD-10-CM

## 2020-06-05 DIAGNOSIS — Z952 Presence of prosthetic heart valve: Secondary | ICD-10-CM

## 2020-06-05 DIAGNOSIS — D696 Thrombocytopenia, unspecified: Secondary | ICD-10-CM | POA: Diagnosis not present

## 2020-08-29 ENCOUNTER — Telehealth (HOSPITAL_COMMUNITY): Payer: Self-pay | Admitting: Lab

## 2020-08-29 NOTE — Telephone Encounter (Signed)
Patient returned call.  Appointments made.

## 2020-09-19 ENCOUNTER — Other Ambulatory Visit (HOSPITAL_COMMUNITY): Payer: Self-pay | Admitting: Surgery

## 2020-09-19 DIAGNOSIS — D649 Anemia, unspecified: Secondary | ICD-10-CM

## 2020-09-19 DIAGNOSIS — D696 Thrombocytopenia, unspecified: Secondary | ICD-10-CM

## 2020-09-22 ENCOUNTER — Inpatient Hospital Stay (HOSPITAL_COMMUNITY): Payer: BC Managed Care – PPO | Attending: Hematology

## 2020-09-22 ENCOUNTER — Other Ambulatory Visit: Payer: Self-pay

## 2020-09-22 DIAGNOSIS — D696 Thrombocytopenia, unspecified: Secondary | ICD-10-CM | POA: Insufficient documentation

## 2020-09-22 DIAGNOSIS — E119 Type 2 diabetes mellitus without complications: Secondary | ICD-10-CM | POA: Diagnosis not present

## 2020-09-22 DIAGNOSIS — Z952 Presence of prosthetic heart valve: Secondary | ICD-10-CM | POA: Diagnosis not present

## 2020-09-22 DIAGNOSIS — D649 Anemia, unspecified: Secondary | ICD-10-CM

## 2020-09-22 DIAGNOSIS — R21 Rash and other nonspecific skin eruption: Secondary | ICD-10-CM | POA: Insufficient documentation

## 2020-09-22 DIAGNOSIS — E611 Iron deficiency: Secondary | ICD-10-CM | POA: Insufficient documentation

## 2020-09-22 DIAGNOSIS — Z79899 Other long term (current) drug therapy: Secondary | ICD-10-CM | POA: Diagnosis not present

## 2020-09-22 DIAGNOSIS — I10 Essential (primary) hypertension: Secondary | ICD-10-CM | POA: Diagnosis not present

## 2020-09-22 DIAGNOSIS — Z808 Family history of malignant neoplasm of other organs or systems: Secondary | ICD-10-CM | POA: Insufficient documentation

## 2020-09-22 DIAGNOSIS — Z800 Family history of malignant neoplasm of digestive organs: Secondary | ICD-10-CM | POA: Insufficient documentation

## 2020-09-22 DIAGNOSIS — Z7901 Long term (current) use of anticoagulants: Secondary | ICD-10-CM | POA: Insufficient documentation

## 2020-09-22 LAB — IRON AND TIBC
Iron: 37 ug/dL (ref 28–170)
Saturation Ratios: 9 % — ABNORMAL LOW (ref 10.4–31.8)
TIBC: 401 ug/dL (ref 250–450)
UIBC: 364 ug/dL

## 2020-09-22 LAB — CBC WITH DIFFERENTIAL/PLATELET
Abs Immature Granulocytes: 0 10*3/uL (ref 0.00–0.07)
Basophils Absolute: 0 10*3/uL (ref 0.0–0.1)
Basophils Relative: 1 %
Eosinophils Absolute: 0.1 10*3/uL (ref 0.0–0.5)
Eosinophils Relative: 2 %
HCT: 37.8 % (ref 36.0–46.0)
Hemoglobin: 12 g/dL (ref 12.0–15.0)
Immature Granulocytes: 0 %
Lymphocytes Relative: 20 %
Lymphs Abs: 0.9 10*3/uL (ref 0.7–4.0)
MCH: 28 pg (ref 26.0–34.0)
MCHC: 31.7 g/dL (ref 30.0–36.0)
MCV: 88.3 fL (ref 80.0–100.0)
Monocytes Absolute: 0.4 10*3/uL (ref 0.1–1.0)
Monocytes Relative: 8 %
Neutro Abs: 3.2 10*3/uL (ref 1.7–7.7)
Neutrophils Relative %: 69 %
Platelets: 74 10*3/uL — ABNORMAL LOW (ref 150–400)
RBC: 4.28 MIL/uL (ref 3.87–5.11)
RDW: 14.9 % (ref 11.5–15.5)
WBC: 4.6 10*3/uL (ref 4.0–10.5)
nRBC: 0 % (ref 0.0–0.2)

## 2020-09-22 LAB — FERRITIN: Ferritin: 25 ng/mL (ref 11–307)

## 2020-09-28 NOTE — Progress Notes (Deleted)
[place], [place] [address]   CLINIC:  Medical Oncology/Hematology  PCP:  Practice, [facility] [place] [address] [phone]   REASON FOR VISIT:  Follow-up for ***  PRIOR THERAPY: ***  CURRENT THERAPY: ***  INTERVAL HISTORY:  Anna Salinas 50 y.o. female returns for routine follow-up of ***  At today's visit, she reports feeling ***.  No recent hospitalizations, surgeries, or changes in baseline health status.  *** B symptoms *** Bleeding, bruising, petechial rash *** Iron pill???  She has ***% energy and ***% appetite. She endorses that she is maintaining a stable weight.    REVIEW OF SYSTEMS:  Review of Systems - Oncology    PAST MEDICAL/SURGICAL HISTORY:  Past Medical History:  Diagnosis Date   Aortic stenosis due to bicuspid aortic valve    Blood transfusion without reported diagnosis    Essential hypertension, benign    Palpitations    S/P laparoscopic hysterectomy 11/26/2017   Type 2 diabetes mellitus ([place])    Past Surgical History:  Procedure Laterality Date   AORTIC VALVE REPLACEMENT N/A 02/14/2014   Procedure: AORTIC VALVE REPLACEMENT (AVR);  Surgeon: Gaye Pollack, MD;  Location: [facility];  Service: Open Heart Surgery;  Laterality: N/A;   CARDIAC CATHETERIZATION     CESAREAN SECTION  2009   CHEST TUBE INSERTION Right 03/27/2014   Procedure: CHEST TUBE INSERTION;  Surgeon: Grace Isaac, MD;  Location: [facility];  Service: Thoracic;  Laterality: Right;   CYSTOSCOPY N/A 11/26/2017   Procedure: CYSTOSCOPY;  Surgeon: Sherlyn Hay, DO;  Location: [facility];  Service: Gynecology;  Laterality: N/A;   INTRAOPERATIVE TRANSESOPHAGEAL ECHOCARDIOGRAM N/A 02/14/2014   Procedure: INTRAOPERATIVE TRANSESOPHAGEAL ECHOCARDIOGRAM;  Surgeon: Gaye Pollack, MD;  Location: [facility];  Service: Open Heart Surgery;  Laterality: N/A;   LEFT AND RIGHT HEART CATHETERIZATION WITH CORONARY ANGIOGRAM N/A 01/25/2014    Procedure: LEFT AND RIGHT HEART CATHETERIZATION WITH CORONARY ANGIOGRAM;  Surgeon: Peter M Martinique, MD;  Location: [facility] CATH [facility];  Service: Cardiovascular;  Laterality: N/A;   PERICARDIAL FLUID DRAINAGE N/A 03/27/2014   Procedure: DRAINAGE OF PERICARDIAL FLUID;  Surgeon: Grace Isaac, MD;  Location: [facility];  Service: Thoracic;  Laterality: N/A;   PLEURAL EFFUSION DRAINAGE Right 03/27/2014   Procedure: DRAINAGE OF PLEURAL EFFUSION;  Surgeon: Grace Isaac, MD;  Location: [facility];  Service: Thoracic;  Laterality: Right;  Drainage of right pleural effusion   SUBXYPHOID PERICARDIAL WINDOW N/A 03/27/2014   Procedure: SUBXYPHOID PERICARDIAL WINDOW;  Surgeon: Grace Isaac, MD;  Location: [facility];  Service: Thoracic;  Laterality: N/A;   TOTAL LAPAROSCOPIC HYSTERECTOMY WITH SALPINGECTOMY Bilateral 11/26/2017   Procedure: TOTAL LAPAROSCOPIC HYSTERECTOMY WITH BILATERAL SALPINGECTOMY LEFT OOPHERECTOMY;  Surgeon: Sherlyn Hay, DO;  Location: [facility];  Service: Gynecology;  Laterality: Bilateral;   TUBAL LIGATION       SOCIAL HISTORY:  Social History   Socioeconomic History   Marital status: Married    Spouse name: Not on file   Number of children: 3   Years of education: Not on file   Highest education level: Not on file  Occupational History   Occupation: Surveyor, quantity: [employer]  Tobacco Use   Smoking status: Never   Smokeless tobacco: Never  Vaping Use   Vaping Use: Never used  Substance and Sexual Activity   Alcohol use: No    Alcohol/week: 0.0 standard drinks   Drug use:  No   Sexual activity: Not on file  Other Topics Concern   Not on file  Social History Narrative   Not on file   Social Determinants of Health   Financial Resource Strain: Not on file  Food Insecurity: Not on file  Transportation Needs: Not on file  Physical Activity: Not on file  Stress: Not on file  Social Connections: Not on file  Intimate Partner Violence: Not on file     FAMILY HISTORY:  Family History  Problem Relation Age of Onset   Diabetes Mother    Heart disease Mother    Colon cancer Maternal Grandmother    Brain cancer Brother    Crohn's disease Brother     CURRENT MEDICATIONS:  Outpatient Encounter Medications as of 09/29/2020  Medication Sig   B-D ULTRAFINE III SHORT PEN 31G X 8 MM MISC Inject into the skin daily.   Insulin Degludec (TRESIBA FLEXTOUCH) 200 UNIT/ML SOPN Inject 76 Units into the skin daily.   metFORMIN (GLUCOPHAGE-XR) 500 MG 24 hr tablet Take 1 tablet (500 mg total) by mouth 2 (two) times daily.   rosuvastatin (CRESTOR) 10 MG tablet Take 1 tablet (10 mg total) by mouth daily.   warfarin (COUMADIN) 5 MG tablet Take 0.5-1 tablets (2.5-5 mg total) by mouth See admin instructions. Take 2.5 mg by mouth daily except take 5 mg by mouth on Monday, Wednesday and Friday   No facility-administered encounter medications on file as of 09/29/2020.    ALLERGIES:  Allergies  Allergen Reactions   Glipizide Nausea And Vomiting and Other (See Comments)    GI upset   Metoprolol Other (See Comments)    "Made me sick"   Shellfish Allergy Swelling    Throat and eyes were swollen.  Had difficulty breathing.  Was hospitalized.       PHYSICAL EXAM:  ECOG PERFORMANCE STATUS: {CHL ONC ECOG [phone]}  There were no vitals filed for this visit. There were no vitals filed for this visit. Physical Exam   LABORATORY DATA:  I have reviewed the labs as listed.  CBC    Component Value Date/Time   WBC 4.6 09/22/2020 0900   RBC 4.28 09/22/2020 0900   HGB 12.0 09/22/2020 0900   HGB 11.9 02/26/2018 1544   HCT 37.8 09/22/2020 0900   HCT 37.1 02/26/2018 1544   PLT 74 (L) 09/22/2020 0900   PLT 151 02/26/2018 1544   MCV 88.3 09/22/2020 0900   MCV 78 (L) 02/26/2018 1544   MCH 28.0 09/22/2020 0900   MCHC 31.7 09/22/2020 0900   RDW 14.9 09/22/2020 0900   RDW 16.0 (H) 02/26/2018 1544   LYMPHSABS 0.9 09/22/2020 0900   LYMPHSABS 1.8  02/26/2018 1544   MONOABS 0.4 09/22/2020 0900   EOSABS 0.1 09/22/2020 0900   EOSABS 0.2 02/26/2018 1544   BASOSABS 0.0 09/22/2020 0900   BASOSABS 0.1 02/26/2018 1544   CMP Latest Ref Rng & Units 03/24/2019 06/01/2018 03/05/2018  Glucose 70 - 99 mg/dL 250(H) 308(H) 267(H)  BUN 6 - 20 mg/dL '10 13 10  '$ Creatinine 0.44 - 1.00 mg/dL 0.70 0.73 0.63  Sodium 135 - 145 mmol/L 136 135 134(L)  Potassium 3.5 - 5.1 mmol/L 3.7 4.0 4.1  Chloride 98 - 111 mmol/L 102 100 100  CO2 22 - 32 mmol/L '26 21 26  '$ Calcium 8.9 - 10.3 mg/dL 8.9 9.5 9.0  Total Protein 6.5 - 8.1 g/dL 6.9 7.0 7.6  Total Bilirubin 0.3 - 1.2 mg/dL 1.0 0.6 0.5  Alkaline Phos 38 -  126 U/L 60 66 76  AST 15 - 41 U/L 26 33 31  ALT 0 - 44 U/L '24 24 30    '$ DIAGNOSTIC IMAGING:  I have independently reviewed the relevant imaging and discussed with the patient.  ASSESSMENT & PLAN: 1.  Intermittent thrombocytopenia: - Mild to moderate thrombocytopenia since 2016, when she had her AVR with mechanical valve. - She is on Coumadin and denies any easy bruising or bleeding. - Nutritional deficiency work-up, hepatitis panel was negative.  ANA was positive with speckled pattern with elevation of 2320.  She does not have any clinical signs or symptoms of lupus. - Differential diagnosis favors mechanical destruction of platelets secondary to mechanical aortic valve or ITP, but also includes less likely differential diagnoses such as early MDS - *** B symptoms - *** Bleeding, bruising, petechial rash - Most recent labs (09/22/2020): Platelets 74, otherwise normal CBC - Most recent platelets are lower than her usual baseline, she usually trends between 100-150 - PLAN: Due to decreased platelet count beyond her usual range, we will repeat labs (including B12 and folate) and RTC with phone visit in 2 months.  If patient has continuing decline in platelets, consider further work-up such as abdominal imaging of liver/spleen.  Patient is aware of alarm symptoms  that would prompt immediate medical attention.  2.  Iron deficiency anemia - She has a history of microcytic anemia, which has resolved.  Hemoglobin on 09/22/2020 is normal at 12.0. - She has mild iron deficiency noted with ferritin 25 and iron saturation 9% - She denies any bleeding per rectum or melena - PLAN: Recommend trial of oral iron supplement with stool softener.   PLAN SUMMARY & DISPOSITION: ***  All questions were answered. The patient knows to call the clinic with any problems, questions or concerns.  Medical decision making: ***  Time spent on visit: I spent {CHL ONC TIME VISIT - [id number] counseling the patient face to face. The total time spent in the appointment was {CHL ONC TIME VISIT - [id number] and more than 50% was on counseling.   Harriett Rush, PA-C  ***

## 2020-09-29 ENCOUNTER — Inpatient Hospital Stay (HOSPITAL_COMMUNITY): Payer: BC Managed Care – PPO | Admitting: Physician Assistant

## 2020-10-05 NOTE — Progress Notes (Addendum)
[place], [place] [address]   CLINIC:  Medical Oncology/Hematology  PCP:  Practice, [facility] [place] [address] [phone]   REASON FOR VISIT:  Follow-up for thrombocytopenia and iron-deficient anemia  CURRENT THERAPY: Observation  INTERVAL HISTORY:  Anna Salinas 50 y.o. female returns for routine follow-up of her thrombocytopenia.  She was last seen on 04/03/2019 by Dr. Delton Coombes.  At today's visit, she reports feeling fairly well.  No recent hospitalizations, surgeries, or changes in baseline health status.  She denies any B symptoms such as fever, chills, night sweats, unintentional weight loss.  She denies easy bruising.  She has not had any major bleeding events such as hematemesis, hemoptysis, melena, hematochezia, or hematuria.  She remains on Coumadin due to her mechanical aortic valve.   She does report that for the past 1 year she has had waxing and waning petechiae and patches of brownish pigmentation of bilateral lower extremities extending from ankles to just below the knees.  She reports that lesions are worse after she has spent the day at work (she works at [employer] and spends most of her shift on her feet), but that they are less numerous after she has been off of work and has been able to put her feet up.  She denies peripheral edema.  She reports that lesions are nonpruritic and nonpainful.  She denies any petechial rash in any other regions of the body.  She has 60% energy and 75% appetite. She endorses that she is maintaining a stable weight.    REVIEW OF SYSTEMS:  Review of Systems  Constitutional:  Positive for fatigue. Negative for appetite change, chills, diaphoresis, fever and unexpected weight change.  HENT:   Negative for lump/mass and nosebleeds.   Eyes:  Negative for eye problems.  Respiratory:  Negative for cough, hemoptysis and shortness of breath.   Cardiovascular:  Negative for chest pain,  leg swelling and palpitations.  Gastrointestinal:  Positive for diarrhea. Negative for abdominal pain, blood in stool, constipation, nausea and vomiting.  Genitourinary:  Negative for hematuria.   Skin: Negative.   Neurological:  Positive for numbness. Negative for dizziness, headaches and light-headedness.  Hematological:  Does not bruise/bleed easily.  Psychiatric/Behavioral:  Positive for sleep disturbance.      PAST MEDICAL/SURGICAL HISTORY:  Past Medical History:  Diagnosis Date   Aortic stenosis due to bicuspid aortic valve    Blood transfusion without reported diagnosis    Essential hypertension, benign    Palpitations    S/P laparoscopic hysterectomy 11/26/2017   Type 2 diabetes mellitus ([place])    Past Surgical History:  Procedure Laterality Date   AORTIC VALVE REPLACEMENT N/A 02/14/2014   Procedure: AORTIC VALVE REPLACEMENT (AVR);  Surgeon: Gaye Pollack, MD;  Location: [facility];  Service: Open Heart Surgery;  Laterality: N/A;   CARDIAC CATHETERIZATION     CESAREAN SECTION  2009   CHEST TUBE INSERTION Right 03/27/2014   Procedure: CHEST TUBE INSERTION;  Surgeon: Grace Isaac, MD;  Location: [facility];  Service: Thoracic;  Laterality: Right;   CYSTOSCOPY N/A 11/26/2017   Procedure: CYSTOSCOPY;  Surgeon: Sherlyn Hay, DO;  Location: [facility];  Service: Gynecology;  Laterality: N/A;   INTRAOPERATIVE TRANSESOPHAGEAL ECHOCARDIOGRAM N/A 02/14/2014   Procedure: INTRAOPERATIVE TRANSESOPHAGEAL ECHOCARDIOGRAM;  Surgeon: Gaye Pollack, MD;  Location: [facility];  Service: Open Heart Surgery;  Laterality: N/A;   LEFT AND RIGHT HEART CATHETERIZATION WITH CORONARY ANGIOGRAM N/A 01/25/2014  Procedure: LEFT AND RIGHT HEART CATHETERIZATION WITH CORONARY ANGIOGRAM;  Surgeon: Peter M Martinique, MD;  Location: [facility];  Service: Cardiovascular;  Laterality: N/A;   PERICARDIAL FLUID DRAINAGE N/A 03/27/2014   Procedure: DRAINAGE OF PERICARDIAL FLUID;  Surgeon: Grace Isaac, MD;  Location: [facility];  Service: Thoracic;  Laterality: N/A;   PLEURAL EFFUSION DRAINAGE Right 03/27/2014   Procedure: DRAINAGE OF PLEURAL EFFUSION;  Surgeon: Grace Isaac, MD;  Location: [facility];  Service: Thoracic;  Laterality: Right;  Drainage of right pleural effusion   SUBXYPHOID PERICARDIAL WINDOW N/A 03/27/2014   Procedure: SUBXYPHOID PERICARDIAL WINDOW;  Surgeon: Grace Isaac, MD;  Location: [facility];  Service: Thoracic;  Laterality: N/A;   TOTAL LAPAROSCOPIC HYSTERECTOMY WITH SALPINGECTOMY Bilateral 11/26/2017   Procedure: TOTAL LAPAROSCOPIC HYSTERECTOMY WITH BILATERAL SALPINGECTOMY LEFT OOPHERECTOMY;  Surgeon: Sherlyn Hay, DO;  Location: [facility];  Service: Gynecology;  Laterality: Bilateral;   TUBAL LIGATION       SOCIAL HISTORY:  Social History   Socioeconomic History   Marital status: Married    Spouse name: Not on file   Number of children: 3   Years of education: Not on file   Highest education level: Not on file  Occupational History   Occupation: Surveyor, quantity: [employer]  Tobacco Use   Smoking status: Never   Smokeless tobacco: Never  Vaping Use   Vaping Use: Never used  Substance and Sexual Activity   Alcohol use: No    Alcohol/week: 0.0 standard drinks   Drug use: No   Sexual activity: Not on file  Other Topics Concern   Not on file  Social History Narrative   Not on file   Social Determinants of Health   Financial Resource Strain: Not on file  Food Insecurity: Not on file  Transportation Needs: Not on file  Physical Activity: Not on file  Stress: Not on file  Social Connections: Not on file  Intimate Partner Violence: Not on file    FAMILY HISTORY:  Family History  Problem Relation Age of Onset   Diabetes Mother    Heart disease Mother    Colon cancer Maternal Grandmother    Brain cancer Brother    Crohn's disease Brother     CURRENT MEDICATIONS:  Outpatient Encounter Medications as of 10/06/2020  Medication Sig   B-D  ULTRAFINE III SHORT PEN 31G X 8 MM MISC Inject into the skin daily.   Insulin Degludec (TRESIBA FLEXTOUCH) 200 UNIT/ML SOPN Inject 76 Units into the skin daily.   metFORMIN (GLUCOPHAGE-XR) 500 MG 24 hr tablet Take 1 tablet (500 mg total) by mouth 2 (two) times daily.   rosuvastatin (CRESTOR) 10 MG tablet Take 1 tablet (10 mg total) by mouth daily.   warfarin (COUMADIN) 5 MG tablet Take 0.5-1 tablets (2.5-5 mg total) by mouth See admin instructions. Take 2.5 mg by mouth daily except take 5 mg by mouth on Monday, Wednesday and Friday   No facility-administered encounter medications on file as of 10/06/2020.    ALLERGIES:  Allergies  Allergen Reactions   Glipizide Nausea And Vomiting and Other (See Comments)    GI upset   Metoprolol Other (See Comments)    "Made me sick"   Shellfish Allergy Swelling    Throat and eyes were swollen.  Had difficulty breathing.  Was hospitalized.       PHYSICAL EXAM:  ECOG PERFORMANCE STATUS: 0 - Asymptomatic  There were no vitals filed for this  visit. There were no vitals filed for this visit. Physical Exam Constitutional:      Appearance: Normal appearance. She is obese.  HENT:     Head: Normocephalic and atraumatic.     Mouth/Throat:     Mouth: Mucous membranes are moist.  Eyes:     Extraocular Movements: Extraocular movements intact.     Pupils: Pupils are equal, round, and reactive to light.  Cardiovascular:     Rate and Rhythm: Normal rate and regular rhythm.     Pulses: Normal pulses.     Comments: Mechanical click auscultated. Pulmonary:     Effort: Pulmonary effort is normal.     Breath sounds: Normal breath sounds.  Abdominal:     General: Bowel sounds are normal.     Palpations: Abdomen is soft.     Tenderness: There is no abdominal tenderness.  Musculoskeletal:        General: No swelling.     Right lower leg: No edema.     Left lower leg: No edema.  Lymphadenopathy:     Cervical: No cervical adenopathy.  Skin:    General:  Skin is warm and dry.     Comments: Nonblanchable petechiae in patches of brownish pigmentation extending from below the knee to above the ankle, nontender, nonpruritic.  Neurological:     General: No focal deficit present.     Mental Status: She is alert and oriented to person, place, and time.  Psychiatric:        Mood and Affect: Mood normal.        Behavior: Behavior normal.     LABORATORY DATA:  I have reviewed the labs as listed.  CBC    Component Value Date/Time   WBC 4.6 09/22/2020 0900   RBC 4.28 09/22/2020 0900   HGB 12.0 09/22/2020 0900   HGB 11.9 02/26/2018 1544   HCT 37.8 09/22/2020 0900   HCT 37.1 02/26/2018 1544   PLT 74 (L) 09/22/2020 0900   PLT 151 02/26/2018 1544   MCV 88.3 09/22/2020 0900   MCV 78 (L) 02/26/2018 1544   MCH 28.0 09/22/2020 0900   MCHC 31.7 09/22/2020 0900   RDW 14.9 09/22/2020 0900   RDW 16.0 (H) 02/26/2018 1544   LYMPHSABS 0.9 09/22/2020 0900   LYMPHSABS 1.8 02/26/2018 1544   MONOABS 0.4 09/22/2020 0900   EOSABS 0.1 09/22/2020 0900   EOSABS 0.2 02/26/2018 1544   BASOSABS 0.0 09/22/2020 0900   BASOSABS 0.1 02/26/2018 1544   CMP Latest Ref Rng & Units 03/24/2019 06/01/2018 03/05/2018  Glucose 70 - 99 mg/dL 250(H) 308(H) 267(H)  BUN 6 - 20 mg/dL 10 13 10   Creatinine 0.44 - 1.00 mg/dL 0.70 0.73 0.63  Sodium 135 - 145 mmol/L 136 135 134(L)  Potassium 3.5 - 5.1 mmol/L 3.7 4.0 4.1  Chloride 98 - 111 mmol/L 102 100 100  CO2 22 - 32 mmol/L 26 21 26   Calcium 8.9 - 10.3 mg/dL 8.9 9.5 9.0  Total Protein 6.5 - 8.1 g/dL 6.9 7.0 7.6  Total Bilirubin 0.3 - 1.2 mg/dL 1.0 0.6 0.5  Alkaline Phos 38 - 126 U/L 60 66 76  AST 15 - 41 U/L 26 33 31  ALT 0 - 44 U/L 24 24 30     DIAGNOSTIC IMAGING:  I have independently reviewed the relevant imaging and discussed with the patient.  ASSESSMENT & PLAN: 1.  Intermittent thrombocytopenia: - Mild to moderate thrombocytopenia since 2016, when she had her AVR with mechanical valve. - She is  on Coumadin and  denies any easy bruising or bleeding. - She does exhibit petechial rash and hyperpigmentation of lower extremities (as of visit on 10/06/2020), suspected to be Schamberg's disease rather than due to thrombocytopenia, further discussed below - Nutritional deficiency work-up, hepatitis panel was negative.  ANA was positive with speckled pattern with elevation of 2320.  She does not have any clinical signs or symptoms of lupus. - She denies any over-the-counter herbal supplements, quinine containing products, or medication changes.  She is not taking any medications associated with classic drug-induced thrombocytopenia. - She has had worsening of her platelet count over the past year.  Most recent labs (09/22/2020) show platelets 74 (otherwise normal CBC), but her usual baseline trends between 100-150. - She reports that she had her first COVID-19 vaccination in December 2021 - unable to rule out COVID-19 vaccine as cause of possible ITP and worsening platelet counts compared to her last CBC from March 2021.  Vaccine induced immune thrombotic thrombocytopenia is considered less likely in her case though, since most individuals present with thrombosis, and isolated thrombocytopenia without thrombosis is less common. - Differential diagnosis favors ITP with some likely element of mechanical destruction of platelets secondary to mechanical aortic valve.  We have not excluded early MDS, but this is considered unlikely at this time given her overall clinical picture. - PLAN: We will repeat platelet count next week (Monday, 10/09/2020), and if platelets remain < 100 K, we will prescribe dexamethasone 40 mg daily x4 days to see if platelets respond - if platelets improve with steroids, would strongly consider ITP as the diagnosis. - RTC in 2 to 3 weeks to discuss results of steroid trial. - If steroid trial does not indicate ITP, we will obtain abdominal imaging of liver/spleen to rule out occult liver disease and  splenomegaly. - We will continue to watch platelets closely, as the patient is taking Coumadin due to history of mechanical valve replacement.  If platelet count reaches 50 K, would consider holding anticoagulation. - Patient is aware of alarm symptoms that would prompt immediate medical attention.  2.  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  No unusual bleeding or bruising. - Do not suspect that petechial rash is related to thrombocytopenia, as her platelets are not low enough to be the probable cause. - PLAN: Lesions appear similar to characteristic pattern of Schamberg's disease (also called progressive pigmented purpura), which has an unknown etiology but is suspected to have aspects of cellular immune reaction and capillary leakage, and primarily affects lower extremities. - Recommended to patient that she wear compression stockings during her work shifts to see if this alleviates her lesions - If lesions persist, would recommend referral to dermatology and/or rheumatology (especially given her ANA positive status).  However, at this time patient reports that she would like to hold off on referral to other specialists. - Patient informed that she should  notify us immediately if she has any worsening petechiae or if petechiae spread to other areas of the body  3.  Iron deficiency without anemia - She has a history of  microcytic anemia, which has resolved.  Hemoglobin on 09/22/2020 is normal at 12.0. - She has mild iron deficiency noted with ferritin 25 and iron saturation 9% - She denies any bleeding per rectum or melena - PLAN: Recommend trial of oral iron supplement with stool softener.  We will repeat iron labs at follow-up visit in 2 months.    PLAN SUMMARY & DISPOSITION: - Repeat CBC next week, followed by trial of steroids and another CBC 7-10 days later - RTC in 2 to 3 weeks to discuss results of steroid trial  All questions were answered. The patient knows to call the clinic with any problems, questions or concerns.  Medical decision making: Moderate  Time spent on visit: I spent 25 minutes counseling the patient face to face. The total time spent in the appointment was 40 minutes and more than 50% was on counseling.   Harriett Rush, PA-C  10/06/2020 4:03 PM

## 2020-10-06 ENCOUNTER — Inpatient Hospital Stay (HOSPITAL_COMMUNITY): Payer: BC Managed Care – PPO | Admitting: Physician Assistant

## 2020-10-06 ENCOUNTER — Other Ambulatory Visit: Payer: Self-pay

## 2020-10-06 DIAGNOSIS — D696 Thrombocytopenia, unspecified: Secondary | ICD-10-CM

## 2020-10-06 NOTE — Patient Instructions (Signed)
[place] at [facility] Discharge Instructions  You were seen today by Tarri Abernethy PA-C for your low platelets (thrombocytopenia).  As we discussed, there are two possible causes of your low platelets.  Due to your history of mechanical aortic valve, it is possible that some of your platelets are being "chewed up" and destroyed by the mechanical valve, which is causing lower platelet levels.  It is also possible that you have some immune mediated destruction of platelets, meaning that your immune system has antibodies that attack your platelets.  In either case, you do not require treatment at this time.  Treatment is only given for low platelets if they are less than 30.  We will continue to watch your platelets closely, and if they continue to drop or if you have any other blood abnormalities, we would consider getting a bone marrow biopsy to make sure there is nothing else going on.  We will see you back for labs and follow-up in 3 months.  If you notice any increased bruising or abnormal bleeding before your next appointment, please call our office immediately and we will schedule you for an appointment as soon as possible.  Additionally, your iron levels are slightly low.  Please start taking over-the-counter iron supplement (ferrous sulfate 325 mg daily).  This may cause some stomach upset, so taking iron pill with food.  It can also cause constipation, but over-the-counter stool softener such as Colace usually work well to help with constipation.  LABS: Return in 3 months for repeat labs  OTHER TESTS: None at this time  MEDICATIONS: Start taking over-the-counter iron supplement once daily  FOLLOW-UP APPOINTMENT: Office visit in 3 months   Thank you for choosing [place] at [facility] to provide your oncology and hematology care.  To afford each patient quality time with our provider, please arrive at least 15 minutes before your  scheduled appointment time.   If you have a lab appointment with the [facility] please come in thru the Main Entrance and check in at the main information desk.  You need to re-schedule your appointment should you arrive 10 or more minutes late.  We strive to give you quality time with our providers, and arriving late affects you and other patients whose appointments are after yours.  Also, if you no show three or more times for appointments you may be dismissed from the clinic at the providers discretion.     Again, thank you for choosing [facility].  Our hope is that these requests will decrease the amount of time that you wait before being seen by our physicians.       _____________________________________________________________  Should you have questions after your visit to [facility], please contact our office at [phone] and follow the prompts.  Our office hours are 8:00 a.m. and 4:30 p.m. Monday - Friday.  Please note that voicemails left after 4:00 p.m. may not be returned until the following business day.  We are closed weekends and major holidays.  You do have access to a nurse 24-7, just call the main number to the clinic [phone] and do not press any options, hold on the line and a nurse will answer the phone.    For prescription refill requests, have your pharmacy contact our office and allow 72 hours.    Due to Covid, you will need to wear a mask upon entering the hospital. If you do  not have a mask, a mask will be given to you at the Main Entrance upon arrival. For doctor visits, patients may have 1 support person age 59 or older with them. For treatment visits, patients can not have anyone with them due to social distancing guidelines and our immunocompromised population.

## 2020-10-10 ENCOUNTER — Other Ambulatory Visit: Payer: Self-pay

## 2020-10-10 ENCOUNTER — Other Ambulatory Visit (HOSPITAL_COMMUNITY): Payer: Self-pay | Admitting: Physician Assistant

## 2020-10-10 ENCOUNTER — Inpatient Hospital Stay (HOSPITAL_COMMUNITY): Payer: BC Managed Care – PPO

## 2020-10-10 DIAGNOSIS — D696 Thrombocytopenia, unspecified: Secondary | ICD-10-CM | POA: Diagnosis not present

## 2020-10-10 LAB — CBC WITH DIFFERENTIAL/PLATELET
Abs Immature Granulocytes: 0.01 10*3/uL (ref 0.00–0.07)
Basophils Absolute: 0 10*3/uL (ref 0.0–0.1)
Basophils Relative: 1 %
Eosinophils Absolute: 0.1 10*3/uL (ref 0.0–0.5)
Eosinophils Relative: 1 %
HCT: 37.4 % (ref 36.0–46.0)
Hemoglobin: 11.7 g/dL — ABNORMAL LOW (ref 12.0–15.0)
Immature Granulocytes: 0 %
Lymphocytes Relative: 18 %
Lymphs Abs: 1 10*3/uL (ref 0.7–4.0)
MCH: 27.7 pg (ref 26.0–34.0)
MCHC: 31.3 g/dL (ref 30.0–36.0)
MCV: 88.4 fL (ref 80.0–100.0)
Monocytes Absolute: 0.4 10*3/uL (ref 0.1–1.0)
Monocytes Relative: 6 %
Neutro Abs: 4.2 10*3/uL (ref 1.7–7.7)
Neutrophils Relative %: 74 %
Platelets: 86 10*3/uL — ABNORMAL LOW (ref 150–400)
RBC: 4.23 MIL/uL (ref 3.87–5.11)
RDW: 14.7 % (ref 11.5–15.5)
WBC: 5.7 10*3/uL (ref 4.0–10.5)
nRBC: 0 % (ref 0.0–0.2)

## 2020-10-10 MED ORDER — DEXAMETHASONE 4 MG PO TABS: 40.0000 mg | ORAL_TABLET | Freq: Every day | ORAL | 0 refills | Status: AC

## 2020-10-10 NOTE — Progress Notes (Signed)
Platelets remain low at 86.  Called and spoke with patient to inform her that a prescription for Decadron 40 mg daily x 4 days has been sent to her pharmacy to see if platelets are responsive to steroids, which would indicate likely ITP.    She is scheduled for repeat CBC next Friday, and we will discuss further at phone visit after those labs have been obtained.

## 2020-10-23 ENCOUNTER — Inpatient Hospital Stay (HOSPITAL_COMMUNITY): Payer: BC Managed Care – PPO | Attending: Hematology

## 2020-10-23 DIAGNOSIS — D696 Thrombocytopenia, unspecified: Secondary | ICD-10-CM | POA: Insufficient documentation

## 2020-10-25 ENCOUNTER — Other Ambulatory Visit (HOSPITAL_COMMUNITY): Payer: BC Managed Care – PPO

## 2020-10-26 ENCOUNTER — Inpatient Hospital Stay (HOSPITAL_COMMUNITY): Payer: BC Managed Care – PPO | Admitting: Physician Assistant

## 2020-10-30 ENCOUNTER — Other Ambulatory Visit: Payer: Self-pay

## 2020-10-30 ENCOUNTER — Inpatient Hospital Stay (HOSPITAL_COMMUNITY): Payer: BC Managed Care – PPO

## 2020-10-30 DIAGNOSIS — D696 Thrombocytopenia, unspecified: Secondary | ICD-10-CM | POA: Diagnosis present

## 2020-10-30 LAB — CBC WITH DIFFERENTIAL/PLATELET
Abs Immature Granulocytes: 0.02 10*3/uL (ref 0.00–0.07)
Basophils Absolute: 0 10*3/uL (ref 0.0–0.1)
Basophils Relative: 0 %
Eosinophils Absolute: 0.1 10*3/uL (ref 0.0–0.5)
Eosinophils Relative: 2 %
HCT: 40.2 % (ref 36.0–46.0)
Hemoglobin: 12.9 g/dL (ref 12.0–15.0)
Immature Granulocytes: 0 %
Lymphocytes Relative: 19 %
Lymphs Abs: 1.3 10*3/uL (ref 0.7–4.0)
MCH: 27.9 pg (ref 26.0–34.0)
MCHC: 32.1 g/dL (ref 30.0–36.0)
MCV: 87 fL (ref 80.0–100.0)
Monocytes Absolute: 0.5 10*3/uL (ref 0.1–1.0)
Monocytes Relative: 6 %
Neutro Abs: 5.3 10*3/uL (ref 1.7–7.7)
Neutrophils Relative %: 73 %
Platelets: 90 10*3/uL — ABNORMAL LOW (ref 150–400)
RBC: 4.62 MIL/uL (ref 3.87–5.11)
RDW: 14.8 % (ref 11.5–15.5)
WBC: 7.2 10*3/uL (ref 4.0–10.5)
nRBC: 0 % (ref 0.0–0.2)

## 2020-10-30 NOTE — Progress Notes (Addendum)
   Theresa **BRIEF NON-BILLABLE PROGRESS NOTE**  I connected with Anna Salinas  on 10/31/20  at  10:58 AM  by telephone and verified that I am speaking with the correct person using two identifiers.  The initial purpose of our visit today was to discuss effects of steroid trial on improving her thrombocytopenia.  However, patient informed me that she had difficulty obtaining her steroids due to insurance hurdles, and only started taking her dexamethasone today (10/31/2020).  Therefore, I have instructed her to repeat CBC 10 days after finishing her steroid taper.  She will be scheduled for labs on Monday, 11/13/2020, and we will complete telephone follow-up visit after that time.  Since there is no new information or changes to her plan, we will not count this as her follow-up visit, but will follow-up after steroid trial and labs are completed, as above.  Tarri Abernethy PA-C 10/31/2020 11:10 AM

## 2020-10-31 ENCOUNTER — Inpatient Hospital Stay (HOSPITAL_BASED_OUTPATIENT_CLINIC_OR_DEPARTMENT_OTHER): Payer: BC Managed Care – PPO | Admitting: Physician Assistant

## 2020-10-31 DIAGNOSIS — D696 Thrombocytopenia, unspecified: Secondary | ICD-10-CM

## 2020-11-06 ENCOUNTER — Other Ambulatory Visit (HOSPITAL_COMMUNITY): Payer: BC Managed Care – PPO

## 2020-11-10 ENCOUNTER — Telehealth (HOSPITAL_COMMUNITY): Payer: BC Managed Care – PPO | Admitting: Physician Assistant

## 2020-11-13 ENCOUNTER — Inpatient Hospital Stay (HOSPITAL_COMMUNITY): Payer: BC Managed Care – PPO

## 2020-11-15 ENCOUNTER — Telehealth (HOSPITAL_COMMUNITY): Payer: BC Managed Care – PPO | Admitting: Physician Assistant

## 2021-01-12 ENCOUNTER — Inpatient Hospital Stay (HOSPITAL_COMMUNITY): Payer: BC Managed Care – PPO | Attending: Hematology

## 2021-01-19 ENCOUNTER — Ambulatory Visit (HOSPITAL_COMMUNITY): Payer: BC Managed Care – PPO | Admitting: Physician Assistant

## 2021-05-29 NOTE — Progress Notes (Deleted)
CARDIOLOGY OFFICE NOTE  Date:  05/29/2021    [name] Date of Birth: [birth year] Medical Record #[id number]  PCP:  [facility]  Cardiologist:  Makenzie Vittorio Martinique  MD  No chief complaint on file.    History of Present Illness: Anna Salinas is a 51 y.o. female who presents for follow up AVR.   She has a history of AVR in 02/2014 for severe AS with a bicuspid AV. She is on chronic coumadin.  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 These were drained. Repeat CXR and Echo in April 2016 showed resolution of effusions and normal valve function.   In Dec 2017 she was complaining of fatigue with periods of exhaustion. No specific cardiac cause found. Felt to be related to poorly controlled DM with A1c 10.5%. She was started on insulin along with metformin.   She was admitted 2019 with severe anemia related to Dysfunctional uterine bleeding. Hgb down to 6.6. Transfused 2 units PRBCs and Hgb improved to 8.8. She did have placement of IUD in April to try and help with the heavy menses. Unable to stop Coumadin due to mechanical AV.  During hospitalization she was noted to have elevated troponin to 0.48. This was felt to be related to demand ischemia secondary to anemia.   On November 13 she underwent laparoscopic total hysterectomy for definitive treatment of her uterine bleeding.   She had elbow surgery in July 2020 after she fell and fractured it.   She reports she is doing well. She has thrombocytopenia and was evaluated by hematology.  No bleeding issues.  She is active with work. Notes some swelling in her ankles when she is standing for 8 hours. No palpitations, dizziness, SOB.   Past Medical History:  Diagnosis Date   Aortic stenosis due to bicuspid aortic valve    Blood transfusion without reported diagnosis    Essential hypertension, benign    Palpitations    S/P laparoscopic hysterectomy 11/26/2017   Type 2  diabetes mellitus ([place])     Past Surgical History:  Procedure Laterality Date   AORTIC VALVE REPLACEMENT N/A 02/14/2014   Procedure: AORTIC VALVE REPLACEMENT (AVR);  Surgeon: Gaye Pollack, MD;  Location: [place];  Service: Open Heart Surgery;  Laterality: N/A;   CARDIAC CATHETERIZATION     CESAREAN SECTION  2009   CHEST TUBE INSERTION Right 03/27/2014   Procedure: CHEST TUBE INSERTION;  Surgeon: Grace Isaac, MD;  Location: [place];  Service: Thoracic;  Laterality: Right;   CYSTOSCOPY N/A 11/26/2017   Procedure: CYSTOSCOPY;  Surgeon: Sherlyn Hay, DO;  Location: [facility];  Service: Gynecology;  Laterality: N/A;   INTRAOPERATIVE TRANSESOPHAGEAL ECHOCARDIOGRAM N/A 02/14/2014   Procedure: INTRAOPERATIVE TRANSESOPHAGEAL ECHOCARDIOGRAM;  Surgeon: Gaye Pollack, MD;  Location: [place] [facility];  Service: Open Heart Surgery;  Laterality: N/A;   LEFT AND RIGHT HEART CATHETERIZATION WITH CORONARY ANGIOGRAM N/A 01/25/2014   Procedure: LEFT AND RIGHT HEART CATHETERIZATION WITH CORONARY ANGIOGRAM;  Surgeon: Shakera Ebrahimi M Martinique, MD;  Location: [facility] C[facility] LAB;  Service: Cardiovascular;  Laterality: N/A;   PERICARDIAL FLUID DRAINAGE N/A 03/27/2014   Procedure: DRAINAGE OF PERICARDIAL FLUID;  Surgeon: Grace Isaac, MD;  Location: [place];  Service: Thoracic;  Laterality: N/A;   PLEURAL EFFUSION DRAINAGE Right 03/27/2014   Procedure: DRAINAGE OF PLEURAL EFFUSION;  Surgeon: Grace Isaac, MD;  Location: [place];  Service: Thoracic;  Laterality: Right;  Drainage of right pleural effusion  SUBXYPHOID PERICARDIAL WINDOW N/A 03/27/2014   Procedure: SUBXYPHOID PERICARDIAL WINDOW;  Surgeon: Grace Isaac, MD;  Location: [place];  Service: Thoracic;  Laterality: N/A;   TOTAL LAPAROSCOPIC HYSTERECTOMY WITH SALPINGECTOMY Bilateral 11/26/2017   Procedure: TOTAL LAPAROSCOPIC HYSTERECTOMY WITH BILATERAL SALPINGECTOMY LEFT OOPHERECTOMY;  Surgeon: Sherlyn Hay, DO;  Location: [facility];  Service: Gynecology;  Laterality:  Bilateral;   TUBAL LIGATION       Medications: Current Outpatient Medications  Medication Sig Dispense Refill   B-D ULTRAFINE III SHORT PEN 31G X 8 MM MISC Inject into the skin daily.     dexamethasone (DECADRON) 4 MG tablet Take by mouth.     Insulin Degludec (TRESIBA FLEXTOUCH) 200 UNIT/ML SOPN Inject 76 Units into the skin daily. 5 pen 5   metFORMIN (GLUCOPHAGE-XR) 500 MG 24 hr tablet Take 1 tablet (500 mg total) by mouth 2 (two) times daily. 180 tablet 1   rosuvastatin (CRESTOR) 10 MG tablet Take 1 tablet (10 mg total) by mouth daily. 90 tablet 3   TRULICITY 7.10 GY/6.9SW SOPN Inject into the skin.     warfarin (COUMADIN) 2.5 MG tablet Take 2.5 mg by mouth 2 (two) times a week.     warfarin (COUMADIN) 5 MG tablet Take 0.5-1 tablets (2.5-5 mg total) by mouth See admin instructions. Take 2.5 mg by mouth daily except take 5 mg by mouth on Monday, Wednesday and Friday 30 tablet 5   No current facility-administered medications for this visit.    Allergies: Allergies  Allergen Reactions   Glipizide Nausea And Vomiting and Other (See Comments)    GI upset   Metoprolol Other (See Comments)    "Made me sick"   Shellfish Allergy Swelling    Throat and eyes were swollen.  Had difficulty breathing.  Was hospitalized.      Social History: The patient  reports that she has never smoked. She has never used smokeless tobacco. She reports that she does not drink alcohol and does not use drugs.   Family History: The patient's family history includes Brain cancer in her brother; Colon cancer in her maternal grandmother; Crohn's disease in her brother; Diabetes in her mother; Heart disease in her mother.   Review of Systems: Please see the history of present illness.  Glucose has been elevated.  All other systems are reviewed and negative.   Physical Exam: VS:  LMP 04/14/2017  .  BMI There is no height or weight on file to calculate BMI.  Wt Readings from Last 3 Encounters:  06/05/20 240  lb 9.6 oz (109.1 kg)  03/31/19 241 lb (109.3 kg)  02/01/19 237 lb 3.2 oz (107.6 kg)   GENERAL:  Well appearing obese WF in NAD HEENT:  PERRL, EOMI, sclera are clear. Oropharynx is clear. NECK:  No jugular venous distention, carotid upstroke brisk and symmetric, no bruits, no thyromegaly or adenopathy LUNGS:  Clear to auscultation bilaterally CHEST:  Unremarkable HEART:  RRR,  PMI not displaced or sustained,good mechanical S2. , no S3, no S4: no clicks, no rubs, no murmurs ABD:  Soft, nontender. BS +, no masses or bruits. No hepatomegaly, no splenomegaly EXT:  2 + pulses throughout, no edema, no cyanosis no clubbing SKIN:  Warm and dry.  No rashes NEURO:  Alert and oriented x 3. Cranial nerves II through XII intact. PSYCH:  Cognitively intact    LABORATORY DATA:  EKG:  EKG is ordered today. NSR rate 75. Poor R wave progression. I have personally reviewed and interpreted  this study.   Lab Results  Component Value Date   WBC 7.2 10/30/2020   HGB 12.9 10/30/2020   HCT 40.2 10/30/2020   PLT 90 (L) 10/30/2020   GLUCOSE 250 (H) 03/24/2019   CHOL 102 06/01/2018   TRIG 117 06/01/2018   HDL 32 (L) 06/01/2018   LDLCALC 47 06/01/2018   ALT 24 03/24/2019   AST 26 03/24/2019   NA 136 03/24/2019   K 3.7 03/24/2019   CL 102 03/24/2019   CREATININE 0.70 03/24/2019   BUN 10 03/24/2019   CO2 26 03/24/2019   TSH 0.93 01/03/2016   INR 4.0 (H) 07/24/2018   HGBA1C 9.3 (H) 07/24/2018   MICROALBUR negative 04/14/2014   Dated 11/10/18: A1c 9.1%, CBC and CMET normal.  Cholesterol 97, triglycerides 98, HDL 33, LDL 47. Dated 08/06/19: cholesterol 111, triglycerides 100, HDL 30, LDL 62.  Dated 01/06/20: A1c 8.2%. CMET normal. Dated 04/18/21: cholesterol 119, triglycerides 30, HDL 33, LDL 77. A1c 12.5%. CMET  normal   BNP (last 3 results) No results for input(s): BNP in the last 8760 hours.  ProBNP (last 3 results) No results for input(s): PROBNP in the last 8760 hours.   Other Studies  Reviewed Today:   Echo: 04/20/2014 Study Conclusions - Left ventricle: The cavity size was normal. Wall thickness was   normal. Systolic function was vigorous. The estimated ejection   fraction was in the range of 65% to 70%. Wall motion was normal;   there were no regional wall motion abnormalities. - Aortic valve: A [employer] mechanical prosthesis was   present. No obvious perivalvular leak based on limited views.   There was no significant regurgitation. Mean gradient (S): 15 mm   Hg. Peak gradient (S): 28 mm Hg. - Right ventricle: The cavity size was below normal. - Tricuspid valve: There was trivial regurgitation. - Pulmonary arteries: Systolic pressure could not be accurately   estimated. - Pericardium, extracardiac: A large pericardial effusion was   identified circumferential to the heart, measures approximately 4   cm posteriorly and 3 cm anteriorly. There is compression of the   right ventricle noted in the subcostal view and further evidence   of tamponade physiology based on respiratory variation in mitral   inflow. There was a right pleural effusion. Impressions: - Normal LV wall thickness with LVEF 65-70%. [employer] mechanical   AVR without obvious perivalvular leak based on limited and with   grossly normal gradients. A large pericardial effusion was   identified circumferential to the heart, measures approximately 4   cm posteriorly and 3-4 cm anteriorly. There is compression of the   right ventricle noted in the subcostal view and further evidence   of tamponade physiology based on respiratory variation in mitral   inflow. Right pleural effusion also noted. Results discussed with   Dr. Servando Snare.  Echo 06/19/17: Study Conclusions   - Left ventricle: The cavity size was normal. Systolic function was   normal. The estimated ejection fraction was in the range of 60%   to 65%. Wall motion was normal; there were no regional wall   motion abnormalities. Left  ventricular diastolic function   parameters were normal. - Aortic valve: There was very mild stenosis. - Right ventricle: The cavity size was normal. Wall thickness was   normal. Systolic function was normal. - Right atrium: The atrium was normal in size. - Tricuspid valve: Structurally normal valve. There was mild   regurgitation. - Pulmonary arteries: The main pulmonary artery  was normal-sized.   Systolic pressure was within the normal range. - Inferior vena cava: The vessel was normal in size. - Pericardium, extracardiac: There was no pericardial effusion.   Impressions:   - S/P AVR with a mechanical valve. Leaflets are not well   visualized. Transaortic gradients are slightly higher than on a   prior study from 04/15/2014 now peak/mean 38/19 mmHg, peak velocity   3.1 m/s, but still within upper normal range for this type of   prosthesis.  Assessment/Plan: 1. S/p AVR in 2016. Echo in 2019 showed normal prosthetic valve function. Continue long term coumadin. Reviewed recommendations for SBE prophylaxis. Valve click is normal today.  2. DM-  Per primary care. On insulin. Reports last A1c 6.5%.   3. Chronic coumadin therapy. INR monitored by primary care.   4. Thrombocytopenia.    Disposition:   FU with me in one year  Signed: Hamna Asa Martinique MD, [facility]    05/29/2021 1:26 PM

## 2021-06-04 ENCOUNTER — Ambulatory Visit: Payer: BC Managed Care – PPO | Admitting: Cardiology

## 2021-08-02 ENCOUNTER — Other Ambulatory Visit (HOSPITAL_COMMUNITY): Payer: Self-pay | Admitting: Physician Assistant

## 2021-08-02 ENCOUNTER — Telehealth (HOSPITAL_COMMUNITY): Payer: Self-pay | Admitting: *Deleted

## 2021-08-02 ENCOUNTER — Encounter (HOSPITAL_COMMUNITY): Payer: Self-pay | Admitting: *Deleted

## 2021-08-02 ENCOUNTER — Other Ambulatory Visit (HOSPITAL_COMMUNITY): Payer: Self-pay | Admitting: *Deleted

## 2021-08-02 DIAGNOSIS — D696 Thrombocytopenia, unspecified: Secondary | ICD-10-CM

## 2021-08-02 MED ORDER — DEXAMETHASONE 4 MG PO TABS: 40.0000 mg | ORAL_TABLET | Freq: Every day | ORAL | 0 refills | Status: AC

## 2021-08-02 NOTE — Telephone Encounter (Signed)
Received lab results from [place].  Tarri Abernethy - PAC reviewed and would like for patient to start taking steroid as prescribed beginning today x 4 days and recheck CBCD next Thursday.  Spoke with patient and gave instructions as to how and when to start medication.  Lab appointment made for next Thursday.  Patient verbalized understanding.

## 2021-08-02 NOTE — Progress Notes (Signed)
Per labs obtained at patient's PCP yesterday (08/01/2021), patient platelets were 60. She is scheduled for follow-up visit with me on 08/17/2021. We have previously tried pulse of steroids to see if that would improve her platelets, but she did not follow-up with repeat labs or follow-up visit.  We will contact her today to instruct her on steroid trial.  Prescription sent to pharmacy for Decadron 40 mg daily x4 days.  We will check repeat CBC/differential 1 week after she starts Decadron.  We will follow-up as scheduled on 08/17/2021.

## 2021-08-06 ENCOUNTER — Other Ambulatory Visit (HOSPITAL_COMMUNITY): Payer: BC Managed Care – PPO

## 2021-08-09 ENCOUNTER — Inpatient Hospital Stay (HOSPITAL_COMMUNITY): Payer: BC Managed Care – PPO | Attending: Hematology

## 2021-08-09 DIAGNOSIS — D696 Thrombocytopenia, unspecified: Secondary | ICD-10-CM | POA: Insufficient documentation

## 2021-08-09 LAB — CBC WITH DIFFERENTIAL/PLATELET
Abs Immature Granulocytes: 0.04 10*3/uL (ref 0.00–0.07)
Basophils Absolute: 0 10*3/uL (ref 0.0–0.1)
Basophils Relative: 0 %
Eosinophils Absolute: 0.1 10*3/uL (ref 0.0–0.5)
Eosinophils Relative: 1 %
HCT: 42.9 % (ref 36.0–46.0)
Hemoglobin: 14.3 g/dL (ref 12.0–15.0)
Immature Granulocytes: 1 %
Lymphocytes Relative: 22 %
Lymphs Abs: 1.2 10*3/uL (ref 0.7–4.0)
MCH: 28.4 pg (ref 26.0–34.0)
MCHC: 33.3 g/dL (ref 30.0–36.0)
MCV: 85.1 fL (ref 80.0–100.0)
Monocytes Absolute: 0.3 10*3/uL (ref 0.1–1.0)
Monocytes Relative: 5 %
Neutro Abs: 3.8 10*3/uL (ref 1.7–7.7)
Neutrophils Relative %: 71 %
Platelets: 52 10*3/uL — ABNORMAL LOW (ref 150–400)
RBC: 5.04 MIL/uL (ref 3.87–5.11)
RDW: 14.1 % (ref 11.5–15.5)
WBC: 5.4 10*3/uL (ref 4.0–10.5)
nRBC: 0 % (ref 0.0–0.2)

## 2021-08-14 ENCOUNTER — Other Ambulatory Visit (HOSPITAL_COMMUNITY): Payer: BC Managed Care – PPO

## 2021-08-16 NOTE — Progress Notes (Signed)
[place], [place] [address]   CLINIC:  Medical Oncology/Hematology  PCP:  Practice, [facility] [address] [place] [address] [phone]   HISTORY OF PRESENT ILLNESS: [name] (51 year old female) follows at our clinic for thrombocytopenia and iron deficiency anemia.  She was last seen by Anna Abernethy PA-C on 10/06/2020.  She was lost to follow-up over the past year, but returns today after PCP noted worsening thrombocytopenia and recommended that she reestablish care with hematology.  Labs sent by PCP (08/01/2021) showed platelets at 60.  Patient was prescribed Decadron 40 mg daily x4 days, which she started taking on  08/03/2021.  Platelets after steroids (08/09/2021) remain low at 52.  Patient continues to take Coumadin (history of mechanical aortic valve replacement).  She denies any abnormal bleeding -- no epistaxis, gum bleeding, hematuria, hematochezia, melena.  She reports easy bruising but denies any large spontaneous bruises.  She continues to have scattered petechiae and brownish hyperpigmentation of her lower extremities which is worse after she has spent her 8-hour shift at work on her feet.  She denies any fever, chills, or night sweats.  She does report unintentional weight loss of 35 pounds over the past year.  She denies any dietary changes or increased activity levels that might have precipitated this.  She does report that she gets full easier, but reports that her total fluid intake remains about the same.  She reports increased fatigue.  She is taking iron tablet daily.  She reports 50% energy with 70% appetite.  She has not noticed any new lumps or bumps.   REVIEW OF SYSTEMS:    Review of Systems  Constitutional:  Positive for fatigue and unexpected weight change. Negative for appetite change, chills, diaphoresis and fever.       Early satiety  HENT:   Negative for lump/mass and nosebleeds.   Eyes:  Negative  for eye problems.  Respiratory:  Negative for cough, hemoptysis and shortness of breath.   Cardiovascular:  Negative for chest pain, leg swelling and palpitations.  Gastrointestinal:  Negative for abdominal pain, blood in stool, constipation, diarrhea, nausea and vomiting.  Genitourinary:  Negative for hematuria.   Skin: Negative.   Neurological:  Positive for dizziness and numbness. Negative for headaches and light-headedness.  Hematological:  Bruises/bleeds easily.  Psychiatric/Behavioral:  Positive for depression and sleep disturbance.       PAST MEDICAL/SURGICAL HISTORY:  Past Medical History:  Diagnosis Date   Aortic stenosis due to bicuspid aortic valve    Blood transfusion without reported diagnosis    Essential hypertension, benign    Palpitations    S/P laparoscopic hysterectomy 11/26/2017   Type 2 diabetes mellitus ([place])    Past Surgical History:  Procedure Laterality Date   AORTIC VALVE REPLACEMENT N/A 02/14/2014   Procedure: AORTIC VALVE REPLACEMENT (AVR);  Surgeon: Gaye Pollack, MD;  Location: [facility];  Service: Open Heart Surgery;  Laterality: N/A;   CARDIAC CATHETERIZATION     CESAREAN SECTION  2009   CHEST TUBE INSERTION Right 03/27/2014   Procedure: CHEST TUBE INSERTION;  Surgeon: Grace Isaac, MD;  Location: [facility];  Service: Thoracic;  Laterality: Right;   CYSTOSCOPY N/A 11/26/2017   Procedure: CYSTOSCOPY;  Surgeon: Sherlyn Hay, DO;  Location: [facility];  Service: Gynecology;  Laterality: N/A;   INTRAOPERATIVE TRANSESOPHAGEAL ECHOCARDIOGRAM N/A 02/14/2014   Procedure: INTRAOPERATIVE TRANSESOPHAGEAL ECHOCARDIOGRAM;  Surgeon: Gaye Pollack, MD;  Location: [facility];  Service: Open Heart Surgery;  Laterality: N/A;   LEFT AND RIGHT HEART CATHETERIZATION WITH CORONARY ANGIOGRAM N/A 01/25/2014   Procedure: LEFT AND RIGHT HEART CATHETERIZATION WITH CORONARY ANGIOGRAM;  Surgeon: Peter M Martinique, MD;  Location: [facility] CATH [facility];  Service: Cardiovascular;  Laterality: N/A;    PERICARDIAL FLUID DRAINAGE N/A 03/27/2014   Procedure: DRAINAGE OF PERICARDIAL FLUID;  Surgeon: Grace Isaac, MD;  Location: [facility];  Service: Thoracic;  Laterality: N/A;   PLEURAL EFFUSION DRAINAGE Right 03/27/2014   Procedure: DRAINAGE OF PLEURAL EFFUSION;  Surgeon: Grace Isaac, MD;  Location: [facility];  Service: Thoracic;  Laterality: Right;  Drainage of right pleural effusion   SUBXYPHOID PERICARDIAL WINDOW N/A 03/27/2014   Procedure: SUBXYPHOID PERICARDIAL WINDOW;  Surgeon: Grace Isaac, MD;  Location: [facility];  Service: Thoracic;  Laterality: N/A;   TOTAL LAPAROSCOPIC HYSTERECTOMY WITH SALPINGECTOMY Bilateral 11/26/2017   Procedure: TOTAL LAPAROSCOPIC HYSTERECTOMY WITH BILATERAL SALPINGECTOMY LEFT OOPHERECTOMY;  Surgeon: Sherlyn Hay, DO;  Location: [facility];  Service: Gynecology;  Laterality: Bilateral;   TUBAL LIGATION       SOCIAL HISTORY:  Social History   Socioeconomic History   Marital status: Married    Spouse name: Not on file   Number of children: 3   Years of education: Not on file   Highest education level: Not on file  Occupational History   Occupation: Surveyor, quantity: [employer]  Tobacco Use   Smoking status: Never   Smokeless tobacco: Never  Vaping Use   Vaping Use: Never used  Substance and Sexual Activity   Alcohol use: No    Alcohol/week: 0.0 standard drinks of alcohol   Drug use: No   Sexual activity: Not on file  Other Topics Concern   Not on file  Social History Narrative   Not on file   Social Determinants of Health   Financial Resource Strain: Not on file  Food Insecurity: Not on file  Transportation Needs: Not on file  Physical Activity: Not on file  Stress: Not on file  Social Connections: Not on file  Intimate Partner Violence: Not on file    FAMILY HISTORY:  Family History  Problem Relation Age of Onset   Diabetes Mother    Heart disease Mother    Colon cancer Maternal Grandmother    Brain cancer  Brother    Crohn's disease Brother     CURRENT MEDICATIONS:  Outpatient Encounter Medications as of 08/17/2021  Medication Sig   B-D ULTRAFINE III SHORT PEN 31G X 8 MM MISC Inject into the skin daily.   Insulin Degludec (TRESIBA FLEXTOUCH) 200 UNIT/ML SOPN Inject 76 Units into the skin daily.   metFORMIN (GLUCOPHAGE-XR) 500 MG 24 hr tablet Take 1 tablet (500 mg total) by mouth 2 (two) times daily.   rosuvastatin (CRESTOR) 10 MG tablet Take 1 tablet (10 mg total) by mouth daily.   TRULICITY 0.76 AU/6.3FH SOPN Inject into the skin.   warfarin (COUMADIN) 2.5 MG tablet Take 2.5 mg by mouth 2 (two) times a week.   warfarin (COUMADIN) 5 MG tablet Take 0.5-1 tablets (2.5-5 mg total) by mouth See admin instructions. Take 2.5 mg by mouth daily except take 5 mg by mouth on Monday, Wednesday and Friday   No facility-administered encounter medications on file as of 08/17/2021.    ALLERGIES:  Allergies  Allergen Reactions   Glipizide Nausea And Vomiting and Other (See Comments)    GI upset   Metoprolol Other (See  Comments)    "Made me sick"   Shellfish Allergy Swelling    Throat and eyes were swollen.  Had difficulty breathing.  Was hospitalized.       PHYSICAL EXAM:    ECOG PERFORMANCE STATUS: 1 - Symptomatic but completely ambulatory  There were no vitals filed for this visit. There were no vitals filed for this visit. Physical Exam Constitutional:      Appearance: Normal appearance. She is obese.  HENT:     Head: Normocephalic and atraumatic.     Mouth/Throat:     Mouth: Mucous membranes are moist.  Eyes:     Extraocular Movements: Extraocular movements intact.     Pupils: Pupils are equal, round, and reactive to light.  Cardiovascular:     Rate and Rhythm: Normal rate and regular rhythm.     Pulses: Normal pulses.     Comments: Mechanical click auscultated. Pulmonary:     Effort: Pulmonary effort is normal.     Breath sounds: Normal breath sounds.  Abdominal:     General:  Bowel sounds are normal.     Palpations: Abdomen is soft. There is no hepatomegaly or splenomegaly.     Tenderness: There is no abdominal tenderness.  Musculoskeletal:        General: No swelling.     Right lower leg: No edema.     Left lower leg: No edema.  Lymphadenopathy:     Head:     Right side of head: No submental, submandibular, tonsillar, preauricular, posterior auricular or occipital adenopathy.     Left side of head: No submental, submandibular, tonsillar, preauricular, posterior auricular or occipital adenopathy.     Cervical: No cervical adenopathy.     Right cervical: No superficial, deep or posterior cervical adenopathy.    Left cervical: No superficial, deep or posterior cervical adenopathy.     Upper Body:     Right upper body: No supraclavicular, axillary, pectoral or epitrochlear adenopathy.     Left upper body: No supraclavicular, axillary, pectoral or epitrochlear adenopathy.  Skin:    General: Skin is warm and dry.     Comments: A few scattered nonblanchable petechiae and patches of brownish pigmentation extending from below the knee to above the ankle, nontender, nonpruritic.  Neurological:     General: No focal deficit present.     Mental Status: She is alert and oriented to person, place, and time.  Psychiatric:        Mood and Affect: Mood normal.        Behavior: Behavior normal.      LABORATORY DATA:  I have reviewed the labs as listed.  CBC    Component Value Date/Time   WBC 5.4 08/09/2021 0909   RBC 5.04 08/09/2021 0909   HGB 14.3 08/09/2021 0909   HGB 11.9 02/26/2018 1544   HCT 42.9 08/09/2021 0909   HCT 37.1 02/26/2018 1544   PLT 52 (L) 08/09/2021 0909   PLT 151 02/26/2018 1544   MCV 85.1 08/09/2021 0909   MCV 78 (L) 02/26/2018 1544   MCH 28.4 08/09/2021 0909   MCHC 33.3 08/09/2021 0909   RDW 14.1 08/09/2021 0909   RDW 16.0 (H) 02/26/2018 1544   LYMPHSABS 1.2 08/09/2021 0909   LYMPHSABS 1.8 02/26/2018 1544   MONOABS 0.3 08/09/2021  0909   EOSABS 0.1 08/09/2021 0909   EOSABS 0.2 02/26/2018 1544   BASOSABS 0.0 08/09/2021 0909   BASOSABS 0.1 02/26/2018 1544      Latest Ref Rng &  Units 03/24/2019   10:10 AM 06/01/2018   11:12 AM 03/05/2018    2:27 PM  CMP  Glucose 70 - 99 mg/dL 250  308  267   BUN 6 - 20 mg/dL _0 Creatinine 0.44 - 1.00 mg/dL 0.70  0.73  0.63   Sodium 135 - 145 mmol/L 136  135  134   Potassium 3.5 - 5.1 mmol/L 3.7  4.0  4.1   Chloride 98 - 111 mmol/L 102  100  100   CO2 22 - 32 mmol/L _1 Calcium 8.9 - 10.3 mg/dL 8.9  9.5  9.0   Total Protein 6.5 - 8.1 g/dL 6.9  7.0  7.6   Total Bilirubin 0.3 - 1.2 mg/dL 1.0  0.6  0.5   Alkaline Phos 38 - 126 U/L 60  66  76   AST 15 - 41 U/L 26  33  31   ALT 0 - 44 U/L _2 DIAGNOSTIC IMAGING:  I have independently reviewed the relevant imaging and discussed with the patient.  ASSESSMENT & PLAN: 1.  Intermittent thrombocytopenia: - Mild to moderate thrombocytopenia since 2016, when she had her AVR with mechanical valve. - She is on Coumadin and denies any easy bruising or bleeding.   - She does exhibit petechial rash and hyperpigmentation of lower extremities (as of visit on 10/06/2020), suspected to be Schamberg's disease rather than due to thrombocytopenia, further discussed below - Nutritional deficiency work-up, hepatitis panel was negative.  ANA was positive with speckled pattern with elevation of 2320.  She does not have any clinical signs or symptoms of lupus.   - She denies any over-the-counter herbal supplements, quinine containing products, or medication changes.  She is not taking any medications associated with classic drug-induced thrombocytopenia. - She has had worsening platelet counts over the past 2 years. - Reports that she had her first COVID-19 vaccination in December 2021 - unable to rule out COVID-19 vaccine as cause of possible ITP and worsening platelet counts compared to her last CBC from March 2021.  Vaccine  induced immune thrombotic thrombocytopenia is considered less likely in her case though, since most individuals present with thrombosis, and isolated thrombocytopenia without thrombosis is less common. - Labs sent by PCP (08/01/2021) showed platelets at 60.  Patient was prescribed Decadron 40 mg daily x4 days, which she took from 08/03/2021 through 08/06/2021.  Platelets after steroids (08/09/2021) remain low at 52. - Abdominal ultrasound was previously ordered but not obtained by patient. - Patient reports 35 pound unintentional weight loss over the past year.  Reports early satiety, but states that she is still has the same caloric intake overall. - PLAN: We will check CT abdomen/pelvis to rule out occult liver disease/splenomegaly in the setting of unexplained thrombocytopenia, as well as to investigate unintentional weight loss. - Repeat labs today with CBC, LDH, and iron panel. - If platelet count reaches 50 K, would consider holding Coumadin (Hx AVR).  - If no findings to explain thrombocytopenia on CT abdomen/pelvis, we will obtain bone marrow biopsy. - Patient is aware of alarm symptoms that would prompt immediate medical attention. - Repeat CBC and RTC 1 month  2.  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  No peripheral edema.  Nonpruritic/nonpainful. - Lesions appear similar in characteristic to the pattern of Schamberg's disease - Little suspicion that t that petechial rash is related to thrombocytopenia, as patient had the skin lesions even when her platelets were only mildly low - PLAN: Lesions appear similar to characteristic pattern of Schamberg's disease (also called progressive pigmented purpura),  which has an unknown etiology but is suspected to have aspects of cellular immune reaction and capillary leakage, and primarily affects lower extremities. - Recommended to patient that she wear compression stockings during her work shifts to see if this alleviates her lesions   - If lesions persist, would recommend referral to dermatology and/or rheumatology (especially given her ANA positive status).  However, at this time patient reports that she would like to hold off on referral to other specialists.   - Patient informed that she should notify us immediately if she has any worsening petechiae or if petechiae spread to other areas of the body   3.  Iron deficiency without anemia - She has a history of microcytic anemia, which has resolved.  Hemoglobin on 08/09/2021 is normal at 14.3/MCV 85.1 - She was previously noted (09/22/2020) to have mild iron deficiency noted with ferritin 25 and iron saturation 9% - She denies any bleeding per rectum or melena.  She is s/p hysterectomy.   - She takes daily iron supplement. - PLAN: Continue oral iron supplement.  We will recheck iron panel today   All questions were answered. The patient knows to call the clinic with any problems, questions or concerns.  Medical decision making: Moderate  Time spent on visit: I spent 20 minutes counseling the patient face to face. The total time spent in the appointment was 30 minutes and more than 50% was on counseling.   Harriett Rush, PA-C  08/17/21 10:27 AM

## 2021-08-17 ENCOUNTER — Inpatient Hospital Stay: Payer: BC Managed Care – PPO

## 2021-08-17 ENCOUNTER — Inpatient Hospital Stay: Payer: BC Managed Care – PPO | Attending: Physician Assistant | Admitting: Physician Assistant

## 2021-08-17 VITALS — BP 139/78 | HR 68 | Temp 97.2°F | Resp 18 | Ht 69.0 in | Wt 204.4 lb

## 2021-08-17 DIAGNOSIS — Z808 Family history of malignant neoplasm of other organs or systems: Secondary | ICD-10-CM | POA: Diagnosis not present

## 2021-08-17 DIAGNOSIS — D696 Thrombocytopenia, unspecified: Secondary | ICD-10-CM | POA: Insufficient documentation

## 2021-08-17 DIAGNOSIS — Z90710 Acquired absence of both cervix and uterus: Secondary | ICD-10-CM | POA: Diagnosis not present

## 2021-08-17 DIAGNOSIS — R634 Abnormal weight loss: Secondary | ICD-10-CM

## 2021-08-17 DIAGNOSIS — Z7901 Long term (current) use of anticoagulants: Secondary | ICD-10-CM | POA: Insufficient documentation

## 2021-08-17 DIAGNOSIS — D509 Iron deficiency anemia, unspecified: Secondary | ICD-10-CM | POA: Diagnosis present

## 2021-08-17 DIAGNOSIS — Z952 Presence of prosthetic heart valve: Secondary | ICD-10-CM | POA: Diagnosis not present

## 2021-08-17 DIAGNOSIS — Z79899 Other long term (current) drug therapy: Secondary | ICD-10-CM | POA: Diagnosis not present

## 2021-08-17 DIAGNOSIS — E611 Iron deficiency: Secondary | ICD-10-CM | POA: Diagnosis not present

## 2021-08-17 DIAGNOSIS — Z800 Family history of malignant neoplasm of digestive organs: Secondary | ICD-10-CM | POA: Insufficient documentation

## 2021-08-17 DIAGNOSIS — D649 Anemia, unspecified: Secondary | ICD-10-CM | POA: Diagnosis not present

## 2021-08-17 DIAGNOSIS — R21 Rash and other nonspecific skin eruption: Secondary | ICD-10-CM | POA: Insufficient documentation

## 2021-08-17 LAB — FERRITIN: Ferritin: 38 ng/mL (ref 11–307)

## 2021-08-17 LAB — CBC WITH DIFFERENTIAL/PLATELET
Abs Immature Granulocytes: 0.01 10*3/uL (ref 0.00–0.07)
Basophils Absolute: 0 10*3/uL (ref 0.0–0.1)
Basophils Relative: 1 %
Eosinophils Absolute: 0.1 10*3/uL (ref 0.0–0.5)
Eosinophils Relative: 1 %
HCT: 41.7 % (ref 36.0–46.0)
Hemoglobin: 13.8 g/dL (ref 12.0–15.0)
Immature Granulocytes: 0 %
Lymphocytes Relative: 21 %
Lymphs Abs: 1.1 10*3/uL (ref 0.7–4.0)
MCH: 28.6 pg (ref 26.0–34.0)
MCHC: 33.1 g/dL (ref 30.0–36.0)
MCV: 86.3 fL (ref 80.0–100.0)
Monocytes Absolute: 0.4 10*3/uL (ref 0.1–1.0)
Monocytes Relative: 7 %
Neutro Abs: 3.6 10*3/uL (ref 1.7–7.7)
Neutrophils Relative %: 70 %
Platelets: 63 10*3/uL — ABNORMAL LOW (ref 150–400)
RBC: 4.83 MIL/uL (ref 3.87–5.11)
RDW: 14.8 % (ref 11.5–15.5)
WBC: 5.1 10*3/uL (ref 4.0–10.5)
nRBC: 0 % (ref 0.0–0.2)

## 2021-08-17 LAB — LACTATE DEHYDROGENASE: LDH: 221 U/L — ABNORMAL HIGH (ref 98–192)

## 2021-08-17 LAB — IRON AND TIBC
Iron: 41 ug/dL (ref 28–170)
Saturation Ratios: 10 % — ABNORMAL LOW (ref 10.4–31.8)
TIBC: 416 ug/dL (ref 250–450)
UIBC: 375 ug/dL

## 2021-08-17 NOTE — Patient Instructions (Signed)
[facility] **   You were seen today by Tarri Abernethy PA-C for your low platelets.  We do not yet know the cause of your low platelets, but it is VERY IMPORTANT that we complete the following tests: - Labs today - CT abdomen and pelvis  Call us immediately or seek immediate medical attention if you notice any severe bruising or abnormal bleeding.  You can continue to take Coumadin for now, but if your blood counts get lower, we may need to make changes to your blood thinner.  FOLLOW-UP APPOINTMENT: Office visit after CT scan and labs to discuss results.  ** Thank you for trusting me with your healthcare!  I strive to provide all of my patients with quality care at each visit.  If you receive a survey for this visit, I would be so grateful to you for taking the time to provide feedback.  Thank you in advance!  ~ Naika Noto                   Dr. Derek Jack   &   Tarri Abernethy, PA-C   - - - - - - - - - - - - - - - - - -    Thank you for choosing [facility] at [facility] to provide your oncology and hematology care.  To afford each patient quality time with our provider, please arrive at least 15 minutes before your scheduled appointment time.   If you have a lab appointment with the [facility] please come in thru the Main Entrance and check in at the main information desk.  You need to re-schedule your appointment should you arrive 10 or more minutes late.  We strive to give you quality time with our providers, and arriving late affects you and other patients whose appointments are after yours.  Also, if you no show three or more times for appointments you may be dismissed from the clinic at the providers discretion.     Again, thank you for choosing [facility].  Our hope is that these requests will decrease the amount of time that you wait before being seen by  our physicians.       _____________________________________________________________  Should you have questions after your visit to [facility], please contact our office at [phone] and follow the prompts.  Our office hours are 8:00 a.m. and 4:30 p.m. Monday - Friday.  Please note that voicemails left after 4:00 p.m. may not be returned until the following business day.  We are closed weekends and major holidays.  You do have access to a nurse 24-7, just call the main number to the clinic [phone] and do not press any options, hold on the line and a nurse will answer the phone.    For prescription refill requests, have your pharmacy contact our office and allow 72 hours.

## 2021-08-23 ENCOUNTER — Other Ambulatory Visit: Payer: Self-pay | Admitting: Physician Assistant

## 2021-08-23 DIAGNOSIS — R634 Abnormal weight loss: Secondary | ICD-10-CM

## 2021-08-23 NOTE — Progress Notes (Signed)
Request for CT A/P for unintentional weight loss was denied by patient's health insurance.  Per insurance dictate, we will check CXR and re-submit request for CT A/P after chest X-ray has been completed.  Patient notified.

## 2021-08-24 ENCOUNTER — Ambulatory Visit (HOSPITAL_COMMUNITY): Payer: BC Managed Care – PPO

## 2021-08-24 ENCOUNTER — Ambulatory Visit (HOSPITAL_COMMUNITY)
Admission: RE | Admit: 2021-08-24 | Discharge: 2021-08-24 | Disposition: A | Discharge status: Home / Self Care | Payer: BC Managed Care – PPO | Source: Ambulatory Visit | Attending: Physician Assistant | Admitting: Physician Assistant

## 2021-08-24 DIAGNOSIS — R634 Abnormal weight loss: Secondary | ICD-10-CM

## 2021-08-27 ENCOUNTER — Telehealth: Payer: Self-pay | Admitting: Cardiology

## 2021-08-27 NOTE — Telephone Encounter (Signed)
Called patient, advised of message below from MD.  Patient verbalized understanding.

## 2021-08-27 NOTE — Telephone Encounter (Signed)
Pt c/o medication issue:  1. Name of Medication: Warfarin  2. How are you currently taking this medication (dosage and times per day)?   3. Are you having a reaction (difficulty breathing--STAT)?   4. What is your medication issue? When she went to pick her medicine up on Friday, the pharmacist said he was wearied about her taking Warfarin and her platelets were low.

## 2021-08-27 NOTE — Telephone Encounter (Signed)
Called patient back- she states that her platelets were low and that her pharmacy told her she didn't need to be on warfarin if her platelets were low. I advised with patient she should contact her PCP office as they were managing the warfarin, but I would send a message to Dr.Jordan to advise as well. She will call PCP as well.  Thanks!

## 2021-08-27 NOTE — Telephone Encounter (Signed)
Called patient left message on personal voice mail to call back. 

## 2021-08-27 NOTE — Telephone Encounter (Signed)
Pt returning a call.

## 2021-08-30 ENCOUNTER — Ambulatory Visit: Payer: BC Managed Care – PPO | Admitting: Physician Assistant

## 2021-09-01 ENCOUNTER — Ambulatory Visit (HOSPITAL_COMMUNITY)
Admission: RE | Admit: 2021-09-01 | Discharge: 2021-09-01 | Disposition: A | Discharge status: Home / Self Care | Payer: BC Managed Care – PPO | Source: Ambulatory Visit | Attending: Physician Assistant | Admitting: Physician Assistant

## 2021-09-01 DIAGNOSIS — R634 Abnormal weight loss: Secondary | ICD-10-CM | POA: Insufficient documentation

## 2021-09-01 DIAGNOSIS — D649 Anemia, unspecified: Secondary | ICD-10-CM | POA: Insufficient documentation

## 2021-09-01 MED ORDER — SODIUM CHLORIDE (PF) 0.9 % IJ SOLN
INTRAMUSCULAR | Status: AC
  Filled 2021-09-01: qty 50

## 2021-09-01 MED ORDER — IOHEXOL 300 MG/ML  SOLN
100.0000 mL | Freq: Once | INTRAMUSCULAR | Status: AC | PRN
  Administered 2021-09-01: 100 mL via INTRAVENOUS

## 2021-09-03 ENCOUNTER — Ambulatory Visit (HOSPITAL_COMMUNITY): Payer: BC Managed Care – PPO

## 2021-09-03 NOTE — Progress Notes (Signed)
CARDIOLOGY OFFICE NOTE  Date:  09/03/2021    [name] Date of Birth: December 06, 1970 Medical Record #[id number]  PCP:  [facility]  Cardiologist:  Muhamad Serano [place]  MD  No chief complaint on file.    History of Present Illness: Anna Salinas is a 51 y.o. female who presents for follow up AVR.   She has a history of AVR in 02/2014 with #21 [facility] valve for severe AS with a bicuspid AV. She is on chronic coumadin.  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 These were drained. Repeat CXR and Echo in April 2016 showed resolution of effusions and normal valve function.   In Dec 2017 she was complaining of fatigue with periods of exhaustion. No specific cardiac cause found. Felt to be related to poorly controlled DM with A1c 10.5%. She was started on insulin along with metformin.   She was admitted 2019 with severe anemia related to Dysfunctional uterine bleeding. Hgb down to 6.6. Transfused 2 units PRBCs and Hgb improved to 8.8. She did have placement of IUD in April to try and help with the heavy menses. Unable to stop Coumadin due to mechanical AV.  During hospitalization she was noted to have elevated troponin to 0.48. This was felt to be related to demand ischemia secondary to anemia.   On November 13 she underwent laparoscopic total hysterectomy for definitive treatment of her uterine bleeding.   She had elbow surgery in July 2020 after she fell and fractured it.   She reports she is doing well. She has thrombocytopenia and is being  evaluated by hematology.  No bleeding issues although she states she bruises easily.   She is active with work. She has lost 34 lbs but sugar control still difficult. Last A1c 11.8%.  No palpitations, dizziness, SOB.   Past Medical History:  Diagnosis Date   Aortic stenosis due to bicuspid aortic valve    Blood transfusion without reported diagnosis    Essential hypertension,  benign    Palpitations    S/P laparoscopic hysterectomy 11/26/2017   Type 2 diabetes mellitus ([place])     Past Surgical History:  Procedure Laterality Date   AORTIC VALVE REPLACEMENT N/A 02/14/2014   Procedure: AORTIC VALVE REPLACEMENT (AVR);  Surgeon: Gaye Pollack, MD;  Location: [place];  Service: Open Heart Surgery;  Laterality: N/A;   CARDIAC CATHETERIZATION     CESAREAN SECTION  2009   CHEST TUBE INSERTION Right 03/27/2014   Procedure: CHEST TUBE INSERTION;  Surgeon: Grace Isaac, MD;  Location: [place];  Service: Thoracic;  Laterality: Right;   CYSTOSCOPY N/A 11/26/2017   Procedure: CYSTOSCOPY;  Surgeon: Sherlyn Hay, DO;  Location: [facility];  Service: Gynecology;  Laterality: N/A;   INTRAOPERATIVE TRANSESOPHAGEAL ECHOCARDIOGRAM N/A 02/14/2014   Procedure: INTRAOPERATIVE TRANSESOPHAGEAL ECHOCARDIOGRAM;  Surgeon: Gaye Pollack, MD;  Location: [facility];  Service: Open Heart Surgery;  Laterality: N/A;   LEFT AND RIGHT HEART CATHETERIZATION WITH CORONARY ANGIOGRAM N/A 01/25/2014   Procedure: LEFT AND RIGHT HEART CATHETERIZATION WITH CORONARY ANGIOGRAM;  Surgeon: Erhard Senske M Martinique, MD;  Location: [facility] CATH LAB;  Service: Cardiovascular;  Laterality: N/A;   PERICARDIAL FLUID DRAINAGE N/A 03/27/2014   Procedure: DRAINAGE OF PERICARDIAL FLUID;  Surgeon: Grace Isaac, MD;  Location: [place];  Service: Thoracic;  Laterality: N/A;   PLEURAL EFFUSION DRAINAGE Right 03/27/2014   Procedure: DRAINAGE OF PLEURAL EFFUSION;  Surgeon: Grace Isaac, MD;  Location: [facility];  Service: Thoracic;  Laterality: Right;  Drainage of right pleural effusion   SUBXYPHOID PERICARDIAL WINDOW N/A 03/27/2014   Procedure: SUBXYPHOID PERICARDIAL WINDOW;  Surgeon: Grace Isaac, MD;  Location: [facility];  Service: Thoracic;  Laterality: N/A;   TOTAL LAPAROSCOPIC HYSTERECTOMY WITH SALPINGECTOMY Bilateral 11/26/2017   Procedure: TOTAL LAPAROSCOPIC HYSTERECTOMY WITH BILATERAL SALPINGECTOMY LEFT OOPHERECTOMY;  Surgeon: Sherlyn Hay, DO;  Location: [facility];  Service: Gynecology;  Laterality: Bilateral;   TUBAL LIGATION       Medications: Current Outpatient Medications  Medication Sig Dispense Refill   B-D ULTRAFINE III SHORT PEN 31G X 8 MM MISC Inject into the skin daily.     metFORMIN (GLUCOPHAGE-XR) 500 MG 24 hr tablet Take 1 tablet (500 mg total) by mouth 2 (two) times daily. 180 tablet 1   NOVOLOG MIX 70/30 FLEXPEN (70-30) 100 UNIT/ML FlexPen Inject into the skin.     rosuvastatin (CRESTOR) 10 MG tablet Take 1 tablet (10 mg total) by mouth daily. 90 tablet 3   TRULICITY 1.61 WR/6.0AV SOPN Inject into the skin.     warfarin (COUMADIN) 2.5 MG tablet Take 2.5 mg by mouth 2 (two) times a week.     warfarin (COUMADIN) 5 MG tablet Take 0.5-1 tablets (2.5-5 mg total) by mouth See admin instructions. Take 2.5 mg by mouth daily except take 5 mg by mouth on Monday, Wednesday and Friday 30 tablet 5   No current facility-administered medications for this visit.    Allergies: Allergies  Allergen Reactions   Glipizide Nausea And Vomiting and Other (See Comments)    GI upset   Metoprolol Other (See Comments)    "Made me sick"   Shellfish Allergy Swelling    Throat and eyes were swollen.  Had difficulty breathing.  Was hospitalized.      Social History: The patient  reports that she has never smoked. She has never used smokeless tobacco. She reports that she does not drink alcohol and does not use drugs.   Family History: The patient's family history includes Brain cancer in her brother; Colon cancer in her maternal grandmother; Crohn's disease in her brother; Diabetes in her mother; Heart disease in her mother.   Review of Systems: Please see the history of present illness.  Glucose has been elevated.  All other systems are reviewed and negative.   Physical Exam: VS:  LMP 04/14/2017  .  BMI There is no height or weight on file to calculate BMI.  Wt Readings from Last 3 Encounters:  08/17/21 204 lb  5.9 oz (92.7 kg)  06/05/20 240 lb 9.6 oz (109.1 kg)  03/31/19 241 lb (109.3 kg)   GENERAL:  Well appearing obese WF in NAD HEENT:  PERRL, EOMI, sclera are clear. Oropharynx is clear. NECK:  No jugular venous distention, carotid upstroke brisk and symmetric, no bruits, no thyromegaly or adenopathy LUNGS:  Clear to auscultation bilaterally CHEST:  Unremarkable HEART:  RRR,  PMI not displaced or sustained,good mechanical S2. , no S3, no S4:  no rubs ABD:  Soft, nontender. BS +, no masses or bruits. No hepatomegaly, no splenomegaly EXT:  2 + pulses throughout, no edema, no cyanosis no clubbing SKIN:  Warm and dry. Chronic splotchy brown discoloration below knees.  NEURO:  Alert and oriented x 3. Cranial nerves II through XII intact. PSYCH:  Cognitively intact    LABORATORY DATA:  EKG:  EKG is ordered today. NSR rate 70.  Minor nonspecific ST abnormality.  I  have personally reviewed and interpreted this study.   Lab Results  Component Value Date   WBC 5.1 08/17/2021   HGB 13.8 08/17/2021   HCT 41.7 08/17/2021   PLT 63 (L) 08/17/2021   GLUCOSE 250 (H) 03/24/2019   CHOL 102 06/01/2018   TRIG 117 06/01/2018   HDL 32 (L) 06/01/2018   LDLCALC 47 06/01/2018   ALT 24 03/24/2019   AST 26 03/24/2019   NA 136 03/24/2019   K 3.7 03/24/2019   CL 102 03/24/2019   CREATININE 0.70 03/24/2019   BUN 10 03/24/2019   CO2 26 03/24/2019   TSH 0.93 01/03/2016   INR 4.0 (H) 07/24/2018   HGBA1C 9.3 (H) 07/24/2018   MICROALBUR negative 04/14/2014   Dated 11/10/18: A1c 9.1%, CBC and CMET normal.  Cholesterol 97, triglycerides 98, HDL 33, LDL 47. Dated 08/06/19: cholesterol 111, triglycerides 100, HDL 30, LDL 62.  Dated 01/06/20: A1c 8.2%. CMET normal. Dated 04/18/21: cholesterol 119, triglycerides 30, HDL 33, LDL 77. A1c 12.5%. CMET and TSH normal. Dated 08/01/21: normal LFTs, TSH. A1c 11.8%.   BNP (last 3 results) No results for input(s): "BNP" in the last 8760 hours.  ProBNP (last 3  results) No results for input(s): "PROBNP" in the last 8760 hours.   Other Studies Reviewed Today:   Echo: 03/26/2014 Study Conclusions - Left ventricle: The cavity size was normal. Wall thickness was   normal. Systolic function was vigorous. The estimated ejection   fraction was in the range of 65% to 70%. Wall motion was normal;   there were no regional wall motion abnormalities. - Aortic valve: A [employer] mechanical prosthesis was   present. No obvious perivalvular leak based on limited views.   There was no significant regurgitation. Mean gradient (S): 15 mm   Hg. Peak gradient (S): 28 mm Hg. - Right ventricle: The cavity size was below normal. - Tricuspid valve: There was trivial regurgitation. - Pulmonary arteries: Systolic pressure could not be accurately   estimated. - Pericardium, extracardiac: A large pericardial effusion was   identified circumferential to the heart, measures approximately 4   cm posteriorly and 3 cm anteriorly. There is compression of the   right ventricle noted in the subcostal view and further evidence   of tamponade physiology based on respiratory variation in mitral   inflow. There was a right pleural effusion. Impressions: - Normal LV wall thickness with LVEF 65-70%. [employer] mechanical   AVR without obvious perivalvular leak based on limited and with   grossly normal gradients. A large pericardial effusion was   identified circumferential to the heart, measures approximately 4   cm posteriorly and 3-4 cm anteriorly. There is compression of the   right ventricle noted in the subcostal view and further evidence   of tamponade physiology based on respiratory variation in mitral   inflow. Right pleural effusion also noted. Results discussed with   Dr. Servando Snare.  Echo 06/19/17: Study Conclusions   - Left ventricle: The cavity size was normal. Systolic function was   normal. The estimated ejection fraction was in the range of 60%   to 65%.  Wall motion was normal; there were no regional wall   motion abnormalities. Left ventricular diastolic function   parameters were normal. - Aortic valve: There was very mild stenosis. - Right ventricle: The cavity size was normal. Wall thickness was   normal. Systolic function was normal. - Right atrium: The atrium was normal in size. - Tricuspid valve: Structurally normal valve.  There was mild   regurgitation. - Pulmonary arteries: The main pulmonary artery was normal-sized.   Systolic pressure was within the normal range. - Inferior vena cava: The vessel was normal in size. - Pericardium, extracardiac: There was no pericardial effusion.   Impressions:   - S/P AVR with a mechanical valve. Leaflets are not well   visualized. Transaortic gradients are slightly higher than on a   prior study from 04/15/2014 now peak/mean 38/19 mmHg, peak velocity   3.1 m/s, but still within upper normal range for this type of   prosthesis.  CT abdomen/pelvis: 09/01/21: IMPRESSION: 1. No acute intra-abdominal or pelvic pathology. 2. Decompensated cirrhosis with splenomegaly and trace ascites. 3. Aortic Atherosclerosis (ICD10-I70.0).      Assessment/Plan: 1. S/p AVR in 2016 with mechanical prosthesis. Echo in 2019 showed normal prosthetic valve function. Exam is normal today and Ecg looks good. Continue long term coumadin. Reviewed recommendations for SBE prophylaxis. Thrombocytopenia is a concern with her being on Coumadin but this appears to likely be related to splenic sequestration. Will continue coumadin for now.   2. DM-  Per primary care. On insulin. Reports last A1c 11.8%. poor control. She has lost weight but if doesn't improve may need endocrinology evaluation.    3. Chronic coumadin therapy. INR monitored by primary care.   4. Thrombocytopenia. Work up per hematology. Recent CT suggests this is related to splenic sequestration.   5. Cirrhosis. Noted on CT. Etiology is unclear. No history of  Etoh. Consider hepatitis (prior transfusions), ? NASH. Further work up per hematology and PCP. No significant edema on exam.    Disposition:   FU with me in one year  Signed: Gerry Blanchfield [place] MD, [facility]    09/03/2021 12:43 PM

## 2021-09-07 ENCOUNTER — Encounter: Payer: Self-pay | Admitting: Physician Assistant

## 2021-09-07 ENCOUNTER — Encounter: Payer: Self-pay | Admitting: Cardiology

## 2021-09-07 ENCOUNTER — Ambulatory Visit: Payer: BC Managed Care – PPO | Admitting: Cardiology

## 2021-09-07 ENCOUNTER — Inpatient Hospital Stay (HOSPITAL_BASED_OUTPATIENT_CLINIC_OR_DEPARTMENT_OTHER): Payer: BC Managed Care – PPO | Admitting: Physician Assistant

## 2021-09-07 VITALS — BP 132/78 | HR 70 | Ht 69.0 in | Wt 211.2 lb

## 2021-09-07 VITALS — BP 134/76 | HR 67 | Temp 98.6°F | Resp 18 | Wt 209.8 lb

## 2021-09-07 DIAGNOSIS — Z7901 Long term (current) use of anticoagulants: Secondary | ICD-10-CM

## 2021-09-07 DIAGNOSIS — R188 Other ascites: Secondary | ICD-10-CM

## 2021-09-07 DIAGNOSIS — E611 Iron deficiency: Secondary | ICD-10-CM

## 2021-09-07 DIAGNOSIS — K7460 Unspecified cirrhosis of liver: Secondary | ICD-10-CM

## 2021-09-07 DIAGNOSIS — Z952 Presence of prosthetic heart valve: Secondary | ICD-10-CM | POA: Diagnosis not present

## 2021-09-07 DIAGNOSIS — D696 Thrombocytopenia, unspecified: Secondary | ICD-10-CM

## 2021-09-07 HISTORY — DX: Iron deficiency: E61.1

## 2021-09-07 NOTE — Progress Notes (Signed)
[place], [place] [id number]   CLINIC:  Medical Oncology/Hematology  PCP:  Practice, [facility] [address] [place] [address] [phone]   HISTORY OF PRESENT ILLNESS: [place] (51 year old female) follows at our clinic for thrombocytopenia and iron deficiency anemia.  She was last seen by [place] Abernethy PA-C on 08/17/2021.  She returns today to discuss results of recent CT scan.  She denies any changes in her health since her visit 3 weeks ago.  Patient continues to take Coumadin (history of mechanical aortic valve replacement).  She denies any abnormal bleeding -- no epistaxis, gum bleeding, hematuria, hematochezia, melena.  She reports easy bruising but denies any large spontaneous bruises.  She continues to have scattered petechiae and brownish hyperpigmentation of her lower extremities which is worse after she has spent her 8-hour shift at work on her feet.  She denies any fever, chills, or night sweats.  Although she lost about 35 pounds in the past year, her weight is stable since her last visit.  She reports that she gets full easier, but reports that her total fluid intake remains about the same.  She reports increased fatigue.  She is taking iron tablet daily.  She reports little to no energy with 80 % appetite.  She has not noticed any new lumps or bumps.   REVIEW OF SYSTEMS:      Review of Systems  Constitutional:  Positive for fatigue. Negative for appetite change, chills, diaphoresis, fever and unexpected weight change.       Early satiety  HENT:   Negative for lump/mass and nosebleeds.   Eyes:  Negative for eye problems.  Respiratory:  Negative for cough, hemoptysis and shortness of breath.   Cardiovascular:  Positive for palpitations. Negative for chest pain and leg swelling.  Gastrointestinal:  Negative for abdominal pain, blood in stool, constipation, diarrhea, nausea and vomiting.  Genitourinary:  Negative for  hematuria.   Skin: Negative.   Neurological:  Positive for dizziness and numbness. Negative for headaches and light-headedness.  Hematological:  Bruises/bleeds easily.  Psychiatric/Behavioral:  Positive for depression and sleep disturbance.       PAST MEDICAL/SURGICAL HISTORY:  Past Medical History:  Diagnosis Date   Aortic stenosis due to bicuspid aortic valve    Blood transfusion without reported diagnosis    Essential hypertension, benign    Palpitations    S/P laparoscopic hysterectomy 11/26/2017   Type 2 diabetes mellitus ([place])    Past Surgical History:  Procedure Laterality Date   AORTIC VALVE REPLACEMENT N/A 02/14/2014   Procedure: AORTIC VALVE REPLACEMENT (AVR);  Surgeon: Gaye Pollack, MD;  Location: [facility];  Service: Open Heart Surgery;  Laterality: N/A;   CARDIAC CATHETERIZATION     CESAREAN SECTION  2009   CHEST TUBE INSERTION Right 03/27/2014   Procedure: CHEST TUBE INSERTION;  Surgeon: Grace Isaac, MD;  Location: [facility];  Service: Thoracic;  Laterality: Right;   CYSTOSCOPY N/A 11/26/2017   Procedure: CYSTOSCOPY;  Surgeon: Sherlyn Hay, DO;  Location: [facility];  Service: Gynecology;  Laterality: N/A;   INTRAOPERATIVE TRANSESOPHAGEAL ECHOCARDIOGRAM N/A 02/14/2014   Procedure: INTRAOPERATIVE TRANSESOPHAGEAL ECHOCARDIOGRAM;  Surgeon: Gaye Pollack, MD;  Location: [facility];  Service: Open Heart Surgery;  Laterality: N/A;   LEFT AND RIGHT HEART CATHETERIZATION WITH CORONARY ANGIOGRAM N/A 01/25/2014   Procedure: LEFT AND RIGHT HEART CATHETERIZATION WITH CORONARY ANGIOGRAM;  Surgeon: Peter M Martinique, MD;  Location: [facility] C[facility];  Service: Cardiovascular;  Laterality: N/A;   PERICARDIAL FLUID DRAINAGE N/A 03/27/2014   Procedure: DRAINAGE OF PERICARDIAL FLUID;  Surgeon: Grace Isaac, MD;  Location: [facility];  Service: Thoracic;  Laterality: N/A;   PLEURAL EFFUSION DRAINAGE Right 03/27/2014   Procedure: DRAINAGE OF PLEURAL EFFUSION;  Surgeon: Grace Isaac, MD;  Location:  [facility];  Service: Thoracic;  Laterality: Right;  Drainage of right pleural effusion   SUBXYPHOID PERICARDIAL WINDOW N/A 03/27/2014   Procedure: SUBXYPHOID PERICARDIAL WINDOW;  Surgeon: Grace Isaac, MD;  Location: [facility];  Service: Thoracic;  Laterality: N/A;   TOTAL LAPAROSCOPIC HYSTERECTOMY WITH SALPINGECTOMY Bilateral 11/26/2017   Procedure: TOTAL LAPAROSCOPIC HYSTERECTOMY WITH BILATERAL SALPINGECTOMY LEFT OOPHERECTOMY;  Surgeon: Sherlyn Hay, DO;  Location: [facility];  Service: Gynecology;  Laterality: Bilateral;   TUBAL LIGATION       SOCIAL HISTORY:  Social History   Socioeconomic History   Marital status: Married    Spouse name: Not on file   Number of children: 3   Years of education: Not on file   Highest education level: Not on file  Occupational History   Occupation: Surveyor, quantity: [employer]  Tobacco Use   Smoking status: Never   Smokeless tobacco: Never  Vaping Use   Vaping Use: Never used  Substance and Sexual Activity   Alcohol use: No    Alcohol/week: 0.0 standard drinks of alcohol   Drug use: No   Sexual activity: Not on file  Other Topics Concern   Not on file  Social History Narrative   Not on file   Social Determinants of Health   Financial Resource Strain: Not on file  Food Insecurity: Not on file  Transportation Needs: Not on file  Physical Activity: Not on file  Stress: Not on file  Social Connections: Not on file  Intimate Partner Violence: Not on file    FAMILY HISTORY:  Family History  Problem Relation Age of Onset   Diabetes Mother    Heart disease Mother    Colon cancer Maternal Grandmother    Brain cancer Brother    Crohn's disease Brother     CURRENT MEDICATIONS:  Outpatient Encounter Medications as of 09/07/2021  Medication Sig   B-D ULTRAFINE III SHORT PEN 31G X 8 MM MISC Inject into the skin daily.   Continuous Blood Gluc Receiver (DEXCOM G6 RECEIVER) DEVI    Continuous Blood Gluc Sensor (DEXCOM G6  SENSOR) MISC SMARTSIG:1 Each Topical Every 10 Days   Continuous Blood Gluc Transmit (DEXCOM G6 TRANSMITTER) MISC    metFORMIN (GLUCOPHAGE-XR) 500 MG 24 hr tablet Take 1 tablet (500 mg total) by mouth 2 (two) times daily.   NOVOLOG MIX 70/30 FLEXPEN (70-30) 100 UNIT/ML FlexPen Inject into the skin.   rosuvastatin (CRESTOR) 10 MG tablet Take 1 tablet (10 mg total) by mouth daily.   warfarin (COUMADIN) 2.5 MG tablet Take 2.5 mg by mouth 2 (two) times a week.   warfarin (COUMADIN) 5 MG tablet Take 0.5-1 tablets (2.5-5 mg total) by mouth See admin instructions. Take 2.5 mg by mouth daily except take 5 mg by mouth on Monday, Wednesday and Friday   No facility-administered encounter medications on file as of 09/07/2021.    ALLERGIES:  Allergies  Allergen Reactions   Glipizide Nausea And Vomiting and Other (See Comments)    GI upset   Metoprolol Other (See Comments)    "Made me sick"   Shellfish Allergy Swelling    Throat  and eyes were swollen.  Had difficulty breathing.  Was hospitalized.      PHYSICAL EXAM:    ECOG PERFORMANCE STATUS: 1 - Symptomatic but completely ambulatory  Vitals:   09/07/21 1103  BP: 134/76  Pulse: 67  Resp: 18  Temp: 98.6 F (37 C)  SpO2: 99%   Filed Weights   09/07/21 1103  Weight: 209 lb 12.8 oz (95.2 kg)   Physical Exam Constitutional:      Appearance: Normal appearance. She is obese.  HENT:     Head: Normocephalic and atraumatic.     Mouth/Throat:     Mouth: Mucous membranes are moist.  Eyes:     Extraocular Movements: Extraocular movements intact.     Pupils: Pupils are equal, round, and reactive to light.  Cardiovascular:     Rate and Rhythm: Normal rate and regular rhythm.     Pulses: Normal pulses.     Comments: Mechanical click auscultated. Pulmonary:     Effort: Pulmonary effort is normal.     Breath sounds: Normal breath sounds.  Abdominal:     General: Bowel sounds are normal.     Palpations: Abdomen is soft. There is no  hepatomegaly or splenomegaly.     Tenderness: There is no abdominal tenderness.  Musculoskeletal:        General: No swelling.     Right lower leg: No edema.     Left lower leg: No edema.  Lymphadenopathy:     Head:     Right side of head: No submental, submandibular, tonsillar, preauricular, posterior auricular or occipital adenopathy.     Left side of head: No submental, submandibular, tonsillar, preauricular, posterior auricular or occipital adenopathy.     Cervical: No cervical adenopathy.     Right cervical: No superficial, deep or posterior cervical adenopathy.    Left cervical: No superficial, deep or posterior cervical adenopathy.     Upper Body:     Right upper body: No supraclavicular, axillary, pectoral or epitrochlear adenopathy.     Left upper body: No supraclavicular, axillary, pectoral or epitrochlear adenopathy.  Skin:    General: Skin is warm and dry.     Comments: A few scattered nonblanchable petechiae and patches of brownish pigmentation extending from below the knee to above the ankle, nontender, nonpruritic.  Neurological:     General: No focal deficit present.     Mental Status: She is alert and oriented to person, place, and time.  Psychiatric:        Mood and Affect: Mood normal.        Behavior: Behavior normal.      LABORATORY DATA:  I have reviewed the labs as listed.  CBC    Component Value Date/Time   WBC 5.1 08/17/2021 1014   RBC 4.83 08/17/2021 1014   HGB 13.8 08/17/2021 1014   HGB 11.9 02/26/2018 1544   HCT 41.7 08/17/2021 1014   HCT 37.1 02/26/2018 1544   PLT 63 (L) 08/17/2021 1014   PLT 151 02/26/2018 1544   MCV 86.3 08/17/2021 1014   MCV 78 (L) 02/26/2018 1544   MCH 28.6 08/17/2021 1014   MCHC 33.1 08/17/2021 1014   RDW 14.8 08/17/2021 1014   RDW 16.0 (H) 02/26/2018 1544   LYMPHSABS 1.1 08/17/2021 1014   LYMPHSABS 1.8 02/26/2018 1544   MONOABS 0.4 08/17/2021 1014   EOSABS 0.1 08/17/2021 1014   EOSABS 0.2 02/26/2018 1544    BASOSABS 0.0 08/17/2021 1014   BASOSABS 0.1 02/26/2018 1544  Latest Ref Rng & Units 03/24/2019   10:10 AM 06/01/2018   11:12 AM 03/05/2018    2:27 PM  CMP  Glucose 70 - 99 mg/dL 250  308  267   BUN 6 - 20 mg/dL '10  13  10   '$ Creatinine 0.44 - 1.00 mg/dL 0.70  0.73  0.63   Sodium 135 - 145 mmol/L 136  135  134   Potassium 3.5 - 5.1 mmol/L 3.7  4.0  4.1   Chloride 98 - 111 mmol/L 102  100  100   CO2 22 - 32 mmol/L '26  21  26   '$ Calcium 8.9 - 10.3 mg/dL 8.9  9.5  9.0   Total Protein 6.5 - 8.1 g/dL 6.9  7.0  7.6   Total Bilirubin 0.3 - 1.2 mg/dL 1.0  0.6  0.5   Alkaline Phos 38 - 126 U/L 60  66  76   AST 15 - 41 U/L 26  33  31   ALT 0 - 44 U/L '24  24  30     '$ DIAGNOSTIC IMAGING:  I have independently reviewed the relevant imaging and discussed with the patient.  ASSESSMENT & PLAN: 1.  Intermittent thrombocytopenia: - Mild to moderate thrombocytopenia since 2016, when she had her AVR with mechanical valve. - She is on Coumadin and denies any easy bruising or bleeding.   - She does exhibit petechial rash and hyperpigmentation of lower extremities (as of visit on 10/06/2020), suspected to be Schamberg's disease rather than due to thrombocytopenia, further discussed below - Nutritional deficiency work-up, hepatitis panel was negative.  ANA was positive with speckled pattern with elevation of 2320.  She does not have any clinical signs or symptoms of lupus.   - She denies any over-the-counter herbal supplements, quinine containing products, or medication changes.  She is not taking any medications associated with classic drug-induced thrombocytopenia. - She has had worsening platelet counts over the past 2 years. - Reports that she had her first COVID-19 vaccination in December 2021 - unable to rule out COVID-19 vaccine as cause of possible ITP and worsening platelet counts compared to her last CBC from March 2021.  Vaccine induced immune thrombotic thrombocytopenia is considered less likely  in her case though, since most individuals present with thrombosis, and isolated thrombocytopenia without thrombosis is less common. - Labs sent by PCP (08/01/2021) showed platelets at 60.  Patient was prescribed Decadron 40 mg daily x4 days, which she took from 08/03/2021 through 08/06/2021.  Platelets after steroids (08/09/2021) remain low at 52. - CT abdomen/pelvis (09/01/2021): Decompensated liver cirrhosis with trace ascites and with splenomegaly (15 cm) - Most recent CBC (08/17/2021) shows platelets at 63 - Patient reports 35 pound unintentional weight loss over the past year.  Reports early satiety, but states that she is still has the same caloric intake overall.  Weight today is stable compared to her visit 1 month ago. - PLAN: Refer to GI due to liver cirrhosis.   - If platelet count < 50 K, would consider holding Coumadin (Hx mechanical AVR).  - Repeat CBC with RTC in 3 months. - Patient is aware of alarm symptoms that would prompt immediate medical attention.  2.  Iron deficiency without anemia - She has a history of microcytic anemia, which has resolved.  Hemoglobin on 08/09/2021 is normal at 14.3/MCV 85.1 - She was previously noted (09/22/2020) to have mild iron deficiency noted with ferritin 25 and iron saturation 9% - She denies any bleeding per  rectum or melena.  She is s/p hysterectomy.   -- She had blood transfusions in 2019 due to anemia from menorrhagia.  She had IV iron several years ago. - She takes daily iron supplement.  - Most recent iron panel (08/17/2021): Ferritin 38, iron saturation 10% with TIBC 416.  Hgb 13.8/MCV 86.3 - Symptomatic with worsening fatigue - PLAN: Recommend IV Venofer 300 mg x 3.  Side effects including rare possibility of allergic reaction.  Patient agrees to proceed. - Continue oral iron supplement.  We will recheck iron panel and CBC in 3 months.  3.  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  No peripheral edema.  Nonpruritic/nonpainful. - Lesions appear similar in characteristic to the pattern of Schamberg's disease (a.k.a. " progressive pigmented purpura") - Little suspicion that petechial rash is related to thrombocytopenia, as patient had the skin lesions even when her platelets were only mildly low - PLAN: Recommended to patient that she wear compression stockings during her work shifts to see if this alleviates her lesions   - Patient declined dermatology referral - Patient informed that she should notify us immediately if she has any worsening petechiae or if petechiae spread to other areas of the body     All questions were answered. The patient knows to call the clinic with any problems, questions or concerns.  Medical decision making: Moderate  Time spent on visit: I spent 20 minutes counseling the patient face to face. The total time spent in the appointment was 30 minutes and more than 50% was on counseling.   Harriett Rush, PA-C  09/07/21 11:07 AM

## 2021-09-07 NOTE — Patient Instructions (Signed)
[facility] **   You were seen today by Tarri Abernethy PA-C for your low platelets.    LIVER CIRRHOSIS Your CT scan showed liver cirrhosis and enlarged spleen. This is the cause of your low platelets. We will refer you to see GI doctor for further management of your liver cirrhosis. We will recheck your platelets and see you for office visit in 3 months.  IRON DEFICIENCY Your iron levels are still low, despite taking iron pill daily. Continue daily iron pill. We will schedule you for IV iron x3 doses.  FOLLOW-UP APPOINTMENT: Same-day labs and office visit in 3 months  ** Thank you for trusting me with your healthcare!  I strive to provide all of my patients with quality care at each visit.  If you receive a survey for this visit, I would be so grateful to you for taking the time to provide feedback.  Thank you in advance!  ~ Denisha Hoel                   Dr. Derek Jack   &   Tarri Abernethy, PA-C   - - - - - - - - - - - - - - - - - -    Thank you for choosing [facility] to provide your oncology and hematology care.  To afford each patient quality time with our provider, please arrive at least 15 minutes before your scheduled appointment time.   If you have a lab appointment with the [facility] please come in thru the Main Entrance and check in at the main information desk.  You need to re-schedule your appointment should you arrive 10 or more minutes late.  We strive to give you quality time with our providers, and arriving late affects you and other patients whose appointments are after yours.  Also, if you no show three or more times for appointments you may be dismissed from the clinic at the providers discretion.     Again, thank you for choosing [facility].  Our hope is that these requests will decrease the amount of time that you wait  before being seen by our physicians.       _____________________________________________________________  Should you have questions after your visit to [facility], please contact our office at [phone] and follow the prompts.  Our office hours are 8:00 a.m. and 4:30 p.m. Monday - Friday.  Please note that voicemails left after 4:00 p.m. may not be returned until the following business day.  We are closed weekends and major holidays.  You do have access to a nurse 24-7, just call the main number to the clinic [phone] and do not press any options, hold on the line and a nurse will answer the phone.    For prescription refill requests, have your pharmacy contact our office and allow 72 hours.

## 2021-09-12 ENCOUNTER — Encounter: Payer: Self-pay | Admitting: *Deleted

## 2021-09-20 ENCOUNTER — Ambulatory Visit: Payer: BC Managed Care – PPO | Admitting: Cardiology

## 2021-09-25 NOTE — H&P (View-Only) (Signed)
Referring Provider: Konrad Saha Primary Care Physician:  [facility] Primary Gastroenterologist:  Dr. Abbey Chatters  Chief Complaint  Patient presents with   Cirrhosis    Tired all the time and losing weight    HPI:   Anna Salinas is a 51 y.o. female presenting today at the request of Pennington, Rebekah M, PA-C for cirrhosis.   She has been following with hematology for thrombocytopenia and iron deficiency anemia.  Anemia seems to have improved/resolved with daily iron supplement and s/p hysterectomy in 2019 for dysfunctional uterine bleeding. Most recent labs 08/17/21 with Hgb 13.8, ferritin 38, iron 41, saturation 10% (L).  She has had mild to moderate thrombocytopenia since 2016 when she had aortic valve replacement with mechanical valve.  Prior nutritional deficiency work-up and acute hepatitis panel was negative in 2020.  ANA was positive with speckled pattern with elevation of 1:320. Noted worsening thrombocytopenia with platelets down to 52 in July.  Patient also reported unintentional weight loss over the last year along with early satiety.  She underwent CT abdomen and pelvis with contrast 09/01/2021 which revealed cirrhosis, splenomegaly, trace ascites. No findings to explain unintentional weight loss.   Prior CTA chest in 2016 suggested hepatic steatosis.    Today:  Bowels move after eating. Can be within a few minutes or hours. Some abdominal cramping prior to a BM that improves thereafter. No watery stools. Can be soft or mushy.   Heartburn 2 days a week or less. Using Tums PRN. No nausea or vomiting. No dysphagia.   Abdominal distension: None LE edema: Occasional Mental status changes: None Itching/rash: Petechiae on LE.  Bruising/bleeding: Bruises easily. No bleeding. No brbpr or melena.    Alcohol:  None Illicit drug use: None Fhx liver disease: None Personal/Fhx autoimmune conditions: None  Currently taking Warfarin 5 mg daily for  this week b/c INR was low. PCP monitoring INR.   Weight loss:  Wt Readings from Last 5 Encounters:  09/27/21 208 lb 9.6 oz (94.6 kg)  09/07/21 209 lb 12.8 oz (95.2 kg)  09/07/21 211 lb 3.2 oz (95.8 kg)  08/17/21 204 lb 5.9 oz (92.7 kg)  06/05/20 240 lb 9.6 oz (109.1 kg)   No dietary changes, activity changes. Has been eating normally.   Stays tired. Scheduled for IV iron x 3 coming up starting next Friday.   Last EGD: Never.  Last Colonoscopy: Never.   Maternal GM in her 90s with colon cancer.   Past Medical History:  Diagnosis Date   Aortic stenosis due to bicuspid aortic valve    Blood transfusion without reported diagnosis    Essential hypertension, benign    Iron deficiency 09/07/2021   Palpitations    S/P laparoscopic hysterectomy 11/26/2017   Type 2 diabetes mellitus ([place])     Past Surgical History:  Procedure Laterality Date   AORTIC VALVE REPLACEMENT N/A 02/14/2014   Procedure: AORTIC VALVE REPLACEMENT (AVR);  Surgeon: Gaye Pollack, MD;  Location: [facility];  Service: Open Heart Surgery;  Laterality: N/A;   CARDIAC CATHETERIZATION     CESAREAN SECTION  2009   CHEST TUBE INSERTION Right 03/27/2014   Procedure: CHEST TUBE INSERTION;  Surgeon: Grace Isaac, MD;  Location: [place] [facility];  Service: Thoracic;  Laterality: Right;   CYSTOSCOPY N/A 11/26/2017   Procedure: CYSTOSCOPY;  Surgeon: Sherlyn Hay, DO;  Location: [facility];  Service: Gynecology;  Laterality: N/A;   INTRAOPERATIVE TRANSESOPHAGEAL ECHOCARDIOGRAM N/A 02/14/2014   Procedure: INTRAOPERATIVE TRANSESOPHAGEAL ECHOCARDIOGRAM;  Surgeon: Gaye Pollack, MD;  Location: [place] [facility];  Service: Open Heart Surgery;  Laterality: N/A;   LEFT AND RIGHT HEART CATHETERIZATION WITH CORONARY ANGIOGRAM N/A 01/25/2014   Procedure: LEFT AND RIGHT HEART CATHETERIZATION WITH CORONARY ANGIOGRAM;  Surgeon: Peter M Martinique, MD;  Location: [facility];  Service: Cardiovascular;  Laterality: N/A;   PERICARDIAL FLUID DRAINAGE N/A 03/27/2014    Procedure: DRAINAGE OF PERICARDIAL FLUID;  Surgeon: Grace Isaac, MD;  Location: [place];  Service: Thoracic;  Laterality: N/A;   PLEURAL EFFUSION DRAINAGE Right 03/27/2014   Procedure: DRAINAGE OF PLEURAL EFFUSION;  Surgeon: Grace Isaac, MD;  Location: [facility];  Service: Thoracic;  Laterality: Right;  Drainage of right pleural effusion   SUBXYPHOID PERICARDIAL WINDOW N/A 03/27/2014   Procedure: SUBXYPHOID PERICARDIAL WINDOW;  Surgeon: Grace Isaac, MD;  Location: [place];  Service: Thoracic;  Laterality: N/A;   TOTAL LAPAROSCOPIC HYSTERECTOMY WITH SALPINGECTOMY Bilateral 11/26/2017   Procedure: TOTAL LAPAROSCOPIC HYSTERECTOMY WITH BILATERAL SALPINGECTOMY LEFT OOPHERECTOMY;  Surgeon: Sherlyn Hay, DO;  Location: [facility];  Service: Gynecology;  Laterality: Bilateral;   TUBAL LIGATION      Current Outpatient Medications  Medication Sig Dispense Refill   dicyclomine (BENTYL) 10 MG capsule Take 1 capsule (10 mg total) by mouth up to 3 (three) times daily before meals as needed for loose stools or abdominal cramping. Hold in the setting of constipation. 90 capsule 2   metFORMIN (GLUCOPHAGE-XR) 500 MG 24 hr tablet Take 1 tablet (500 mg total) by mouth 2 (two) times daily. 180 tablet 1   NOVOLOG MIX 70/30 FLEXPEN (70-30) 100 UNIT/ML FlexPen Inject into the skin. 60 units in the morning. 30 units at night.     rosuvastatin (CRESTOR) 10 MG tablet Take 1 tablet (10 mg total) by mouth daily. 90 tablet 3   warfarin (COUMADIN) 5 MG tablet Take 0.5-1 tablets (2.5-5 mg total) by mouth See admin instructions. Take 2.5 mg by mouth daily except take 5 mg by mouth on Monday, Wednesday and Friday 30 tablet 5   No current facility-administered medications for this visit.    Allergies as of 09/27/2021 - Review Complete 09/27/2021  Allergen Reaction Noted   Glipizide Nausea And Vomiting and Other (See Comments) 10/27/2013   Metoprolol Other (See Comments) 03/26/2014   Shellfish allergy Swelling  01/21/2014    Family History  Problem Relation Age of Onset   Diabetes Mother    Heart disease Mother    Brain cancer Brother    Crohn's disease Brother    Colon cancer Maternal Grandmother    Cancer - Colon Maternal Grandmother        90s    Social History   Socioeconomic History   Marital status: Married    Spouse name: Not on file   Number of children: 3   Years of education: Not on file   Highest education level: Not on file  Occupational History   Occupation: Surveyor, quantity: [employer]  Tobacco Use   Smoking status: Never   Smokeless tobacco: Never  Vaping Use   Vaping Use: Never used  Substance and Sexual Activity   Alcohol use: No    Alcohol/week: 0.0 standard drinks of alcohol   Drug use: No   Sexual activity: Not on file  Other Topics Concern   Not on file  Social History Narrative   Not on file   Social Determinants of Health   Financial Resource Strain: Not on file  Food Insecurity: Not on file  Transportation Needs: Not on file  Physical Activity: Not on file  Stress: Not on file  Social Connections: Not on file  Intimate Partner Violence: Not on file    Review of Systems: Gen: Denies any fever, chills, cold or flulike symptoms, presyncope, syncope. CV: Denies chest pain, heart palpitations. Resp: Denies shortness of breath, cough. GI: See HPI GU : Denies urinary burning, urinary frequency, urinary hesitancy MS: Denies joint pain. Derm: Denies rash. Psych: Denies depression, anxiety. Heme: See HPI  Physical Exam: BP 134/79 (BP Location: Right Leg, Patient Position: Sitting, Cuff Size: Normal)   Pulse 78   Temp 98 F (36.7 C) (Temporal)   Ht '5\' 8"'$  (1.727 m)   Wt 208 lb 9.6 oz (94.6 kg)   LMP 04/14/2017   BMI 31.72 kg/m  General:   Alert and oriented. Pleasant and cooperative. Well-nourished and well-developed.  Head:  Normocephalic and atraumatic. Eyes:  Without icterus, sclera clear and conjunctiva pink.  Ears:   Normal auditory acuity. Lungs:  Clear to auscultation bilaterally. No wheezes, rales, or rhonchi. No distress.  Heart:  S1, S2 present without murmurs appreciated. Mechanical click appreciated.  Abdomen:  +BS, soft, non-tender and non-distended. No HSM noted. No guarding or rebound. No masses appreciated.  Rectal:  Deferred  Msk:  Symmetrical without gross deformities. Normal posture. Extremities:  Scattered brown patches of pigmentation on LE below the knee. Neurologic:  Alert and  oriented x4;  grossly normal neurologically. Skin:  Intact without significant lesions or rashes. Psych:  Normal mood and affect.    Assessment:  51 y.o. female with history of aortic stenosis s/p mechanical valve replacement on Warfarin, HTN, diabetes, IDA, thrombocytopenia, presenting today for further evaluation of recently diagnosed cirrhosis. Also reported unintentional weight loss, loose stools with abdominal cramping.   Cirrhosis:  Recent CT A/P with contrast 09/01/21 with cirrhosis, splenomegaly, and trace perihepatic and pelvic free fluid. Prior CTA chest in 2016 suggesting hepatic steatosis. No history of alcohol use, illicit drug use, Fhx of liver disease, or personal or Fhx of autoimmune disease. Query cirrhosis secondary to fatty liver in the setting of HTN, diabetes, and obesity, but will need to complete additional work-up to rule out other causes. Prior acute hepatitis panel negative in 2020. ANA was positive in 2020. Will also need updated labs to calculate MELD and first ever EGD to screen for varices. Though there was mention of trace ascites on CT, she has no appreciable ascites and no LE edema on exam. Will hold off on diuretics for now and counseled on low sodium diet. No HE.   Thrombocytopenia:  Chronic since aortic mechanical heart valve placement in 2016, but this has been worsening with platelets as 52 in July likely secondary to cirrhosis with splenomegaly. Admits to easy bruising, but no  bleeding. Following with hematology who has recommended considering holding Coumadin if platelets drop below 50K. Most recent labs in August with platelets 63.   IDA:  History of IDA following with hematology. Anemia seemed to improve/resolve with daily iron supplementation s/p hysterectomy in 2019 for dysfunctional uterine bleeding. Most recent labs 08/17/21 with Hgb 13.8, ferritin 38, iron 41, saturation 10% (L). Admits to fatigue. She is no longer taking oral iron and is scheduled for IV iron x 3 with hematology in the coming weeks. Denies overt GI bleeding or vaginal bleeding. No NSAIDs. No prior EGD or colonoscopy which we will arrange in the near future as she needs EGD for variceal  screening, first ever colonoscopy, and unintentional weight loss.   Unintentional weight loss:  Reports unintentional weight loss over the last several months without significant upper GI symptoms. Occasional heartburn, but well controlled with tums prn. No change in dietary intake, activity changes, bowel habit changes, or overt GI bleeding. Per chart review, she weighed 240 lbs in May 2022, 204 lbs August 2023, and weight has been stable over the last month weighing 208 lbs today. Recent CT A/P with contrast without findings to explain weight loss. Will plan for EGD and first ever colonoscopy in the near future.   Loose stool:  Chronic postprandial bowel movements with associated abdominal cramping that improves with a bowel movement with stools ranging from soft to mushy. No alarm symptoms. Suspect component of IBS. Will trial dicyclomine for symptom management. Will also screen for celiac disease and check thyroid function.    Plan:  CBC, CMP, INR, AFP, immunoglobulins, ANA, AMA, ASMA, ceruloplasmin, alpha 1 antitrypsin phenotype, Hep A ab total, Hep B surface Ab, Hep B surface Ag, Hep B core Ab, Hep C Ab, TSH, TTG IgA.  Proceed with upper endoscopy + TCS with propofol by Dr. Abbey Chatters in near future. The risks,  benefits, and alternatives have been discussed with the patient in detail. The patient states understanding and desires to proceed.  ASA 3 Will reach out to cardiology to get approval to hold Warfarin x 5 days prior to procedures.  Separate instructions provided for diabetes medication adjustments.  Nutrition:  High-protein diet from a primarily plant-based diet. Avoid red meat.  No raw or undercooked meat, seafood, or shellfish. Low-fat/cholesterol/carbohydrate diet. Limit sodium to no more than 2000 mg/day including everything that you eat and drink. Recommend at least 30 minutes of aerobic and resistance exercise 3 days/week. Patient will monitor for abdominal distension, LE edema, mental status changes, brbpr, and melena and let us know if this occurs.  Dicyclomine 10 mg. Start with once daily and increase up to 3 times daily before meals. Hold in setting of constipation.  Follow-up after procedures.    Aliene Altes, PA-C [facility] 09/27/2021

## 2021-09-25 NOTE — Progress Notes (Signed)
Referring Provider: Konrad Saha Primary Care Physician:  [facility] Gastroenterologist:  Dr. Abbey Chatters  Chief Complaint  Patient presents with   Cirrhosis    Tired all the time and losing weight    HPI:   Anna Salinas is a 51 y.o. female presenting today at the request of Pennington, Rebekah M, PA-C for cirrhosis.   She has been following with hematology for thrombocytopenia and iron deficiency anemia.  Anemia seems to have improved/resolved with daily iron supplement and s/p hysterectomy in 2019 for dysfunctional uterine bleeding. Most recent labs 08/17/21 with Hgb 13.8, ferritin 38, iron 41, saturation 10% (L).  She has had mild to moderate thrombocytopenia since 2016 when she had aortic valve replacement with mechanical valve.  Prior nutritional deficiency work-up and acute hepatitis panel was negative in 2020.  ANA was positive with speckled pattern with elevation of 1:320. Noted worsening thrombocytopenia with platelets down to 52 in July.  Patient also reported unintentional weight loss over the last year along with early satiety.  She underwent CT abdomen and pelvis with contrast 09/01/2021 which revealed cirrhosis, splenomegaly, trace ascites. No findings to explain unintentional weight loss.   Prior CTA chest in 2016 suggested hepatic steatosis.    Today:  Bowels move after eating. Can be within a few minutes or hours. Some abdominal cramping prior to a BM that improves thereafter. No watery stools. Can be soft or mushy.   Heartburn 2 days a week or less. Using Tums PRN. No nausea or vomiting. No dysphagia.   Abdominal distension: None LE edema: Occasional Mental status changes: None Itching/rash: Petechiae on LE.  Bruising/bleeding: Bruises easily. No bleeding. No brbpr or melena.    Alcohol:  None Illicit drug use: None Fhx liver disease: None Personal/Fhx autoimmune conditions: None  Currently taking Warfarin 5 mg daily for  this week b/c INR was low. PCP monitoring INR.   Weight loss:  Wt Readings from Last 5 Encounters:  09/27/21 208 lb 9.6 oz (94.6 kg)  09/07/21 209 lb 12.8 oz (95.2 kg)  09/07/21 211 lb 3.2 oz (95.8 kg)  08/17/21 204 lb 5.9 oz (92.7 kg)  06/05/20 240 lb 9.6 oz (109.1 kg)   No dietary changes, activity changes. Has been eating normally.   Stays tired. Scheduled for IV iron x 3 coming up starting next Friday.   Last EGD: Never.  Last Colonoscopy: Never.   Maternal GM in her 90s with colon cancer.   Past Medical History:  Diagnosis Date   Aortic stenosis due to bicuspid aortic valve    Blood transfusion without reported diagnosis    Essential hypertension, benign    Iron deficiency 09/07/2021   Palpitations    S/P laparoscopic hysterectomy 11/26/2017   Type 2 diabetes mellitus ([place])     Past Surgical History:  Procedure Laterality Date   AORTIC VALVE REPLACEMENT N/A 02/14/2014   Procedure: AORTIC VALVE REPLACEMENT (AVR);  Surgeon: Gaye Pollack, MD;  Location: [place];  Service: Open Heart Surgery;  Laterality: N/A;   CARDIAC CATHETERIZATION     CESAREAN SECTION  2009   CHEST TUBE INSERTION Right 03/27/2014   Procedure: CHEST TUBE INSERTION;  Surgeon: Grace Isaac, MD;  Location: [place];  Service: Thoracic;  Laterality: Right;   CYSTOSCOPY N/A 11/26/2017   Procedure: CYSTOSCOPY;  Surgeon: Sherlyn Hay, DO;  Location: [facility];  Service: Gynecology;  Laterality: N/A;   INTRAOPERATIVE TRANSESOPHAGEAL ECHOCARDIOGRAM N/A 02/14/2014   Procedure: INTRAOPERATIVE TRANSESOPHAGEAL ECHOCARDIOGRAM;  Surgeon: Gaye Pollack, MD;  Location: [place];  Service: Open Heart Surgery;  Laterality: N/A;   LEFT AND RIGHT HEART CATHETERIZATION WITH CORONARY ANGIOGRAM N/A 01/25/2014   Procedure: LEFT AND RIGHT HEART CATHETERIZATION WITH CORONARY ANGIOGRAM;  Surgeon: Peter M Martinique, MD;  Location: [facility] C[facility];  Service: Cardiovascular;  Laterality: N/A;   PERICARDIAL FLUID DRAINAGE N/A 03/27/2014    Procedure: DRAINAGE OF PERICARDIAL FLUID;  Surgeon: Grace Isaac, MD;  Location: [place];  Service: Thoracic;  Laterality: N/A;   PLEURAL EFFUSION DRAINAGE Right 03/27/2014   Procedure: DRAINAGE OF PLEURAL EFFUSION;  Surgeon: Grace Isaac, MD;  Location: [place];  Service: Thoracic;  Laterality: Right;  Drainage of right pleural effusion   SUBXYPHOID PERICARDIAL WINDOW N/A 03/27/2014   Procedure: SUBXYPHOID PERICARDIAL WINDOW;  Surgeon: Grace Isaac, MD;  Location: [place];  Service: Thoracic;  Laterality: N/A;   TOTAL LAPAROSCOPIC HYSTERECTOMY WITH SALPINGECTOMY Bilateral 11/26/2017   Procedure: TOTAL LAPAROSCOPIC HYSTERECTOMY WITH BILATERAL SALPINGECTOMY LEFT OOPHERECTOMY;  Surgeon: Sherlyn Hay, DO;  Location: [facility];  Service: Gynecology;  Laterality: Bilateral;   TUBAL LIGATION      Current Outpatient Medications  Medication Sig Dispense Refill   dicyclomine (BENTYL) 10 MG capsule Take 1 capsule (10 mg total) by mouth up to 3 (three) times daily before meals as needed for loose stools or abdominal cramping. Hold in the setting of constipation. 90 capsule 2   metFORMIN (GLUCOPHAGE-XR) 500 MG 24 hr tablet Take 1 tablet (500 mg total) by mouth 2 (two) times daily. 180 tablet 1   NOVOLOG MIX 70/30 FLEXPEN (70-30) 100 UNIT/ML FlexPen Inject into the skin. 60 units in the morning. 30 units at night.     rosuvastatin (CRESTOR) 10 MG tablet Take 1 tablet (10 mg total) by mouth daily. 90 tablet 3   warfarin (COUMADIN) 5 MG tablet Take 0.5-1 tablets (2.5-5 mg total) by mouth See admin instructions. Take 2.5 mg by mouth daily except take 5 mg by mouth on Monday, Wednesday and Friday 30 tablet 5   No current facility-administered medications for this visit.    Allergies as of 09/27/2021 - Review Complete 09/27/2021  Allergen Reaction Noted   Glipizide Nausea And Vomiting and Other (See Comments) 10/27/2013   Metoprolol Other (See Comments) 03/26/2014   Shellfish allergy Swelling  01/21/2014    Family History  Problem Relation Age of Onset   Diabetes Mother    Heart disease Mother    Brain cancer Brother    Crohn's disease Brother    Colon cancer Maternal Grandmother    Cancer - Colon Maternal Grandmother        90s    Social History   Socioeconomic History   Marital status: Married    Spouse name: Not on file   Number of children: 3   Years of education: Not on file   Highest education level: Not on file  Occupational History   Occupation: Surveyor, quantity: [employer]  Tobacco Use   Smoking status: Never   Smokeless tobacco: Never  Vaping Use   Vaping Use: Never used  Substance and Sexual Activity   Alcohol use: No    Alcohol/week: 0.0 standard drinks of alcohol   Drug use: No   Sexual activity: Not on file  Other Topics Concern   Not on file  Social History Narrative   Not on file   Social Determinants of Health   Financial Resource Strain: Not on file  Food Insecurity: Not on file  Transportation Needs: Not on file  Physical Activity: Not on file  Stress: Not on file  Social Connections: Not on file  Intimate Partner Violence: Not on file    Review of Systems: Gen: Denies any fever, chills, cold or flulike symptoms, presyncope, syncope. CV: Denies chest pain, heart palpitations. Resp: Denies shortness of breath, cough. GI: See HPI GU : Denies urinary burning, urinary frequency, urinary hesitancy MS: Denies joint pain. Derm: Denies rash. Psych: Denies depression, anxiety. Heme: See HPI  Physical Exam: BP 134/79 (BP Location: Right Leg, Patient Position: Sitting, Cuff Size: Normal)   Pulse 78   Temp 98 F (36.7 C) (Temporal)   Ht '5\' 8"'$  (1.727 m)   Wt 208 lb 9.6 oz (94.6 kg)   LMP 04/14/2017   BMI 31.72 kg/m  General:   Alert and oriented. Pleasant and cooperative. Well-nourished and well-developed.  Head:  Normocephalic and atraumatic. Eyes:  Without icterus, sclera clear and conjunctiva pink.  Ears:   Normal auditory acuity. Lungs:  Clear to auscultation bilaterally. No wheezes, rales, or rhonchi. No distress.  Heart:  S1, S2 present without murmurs appreciated. Mechanical click appreciated.  Abdomen:  +BS, soft, non-tender and non-distended. No HSM noted. No guarding or rebound. No masses appreciated.  Rectal:  Deferred  Msk:  Symmetrical without gross deformities. Normal posture. Extremities:  Scattered brown patches of pigmentation on LE below the knee. Neurologic:  Alert and  oriented x4;  grossly normal neurologically. Skin:  Intact without significant lesions or rashes. Psych:  Normal mood and affect.    Assessment:  51 y.o. female with history of aortic stenosis s/p mechanical valve replacement on Warfarin, HTN, diabetes, IDA, thrombocytopenia, presenting today for further evaluation of recently diagnosed cirrhosis. Also reported unintentional weight loss, loose stools with abdominal cramping.   Cirrhosis:  Recent CT A/P with contrast 09/01/21 with cirrhosis, splenomegaly, and trace perihepatic and pelvic free fluid. Prior CTA chest in 2016 suggesting hepatic steatosis. No history of alcohol use, illicit drug use, Fhx of liver disease, or personal or Fhx of autoimmune disease. Query cirrhosis secondary to fatty liver in the setting of HTN, diabetes, and obesity, but will need to complete additional work-up to rule out other causes. Prior acute hepatitis panel negative in 2020. ANA was positive in 2020. Will also need updated labs to calculate MELD and first ever EGD to screen for varices. Though there was mention of trace ascites on CT, she has no appreciable ascites and no LE edema on exam. Will hold off on diuretics for now and counseled on low sodium diet. No HE.   Thrombocytopenia:  Chronic since aortic mechanical heart valve placement in 2016, but this has been worsening with platelets as 52 in July likely secondary to cirrhosis with splenomegaly. Admits to easy bruising, but no  bleeding. Following with hematology who has recommended considering holding Coumadin if platelets drop below 50K. Most recent labs in August with platelets 63.   IDA:  History of IDA following with hematology. Anemia seemed to improve/resolve with daily iron supplementation s/p hysterectomy in 2019 for dysfunctional uterine bleeding. Most recent labs 08/17/21 with Hgb 13.8, ferritin 38, iron 41, saturation 10% (L). Admits to fatigue. She is no longer taking oral iron and is scheduled for IV iron x 3 with hematology in the coming weeks. Denies overt GI bleeding or vaginal bleeding. No NSAIDs. No prior EGD or colonoscopy which we will arrange in the near future as she needs EGD for variceal  screening, first ever colonoscopy, and unintentional weight loss.   Unintentional weight loss:  Reports unintentional weight loss over the last several months without significant upper GI symptoms. Occasional heartburn, but well controlled with tums prn. No change in dietary intake, activity changes, bowel habit changes, or overt GI bleeding. Per chart review, she weighed 240 lbs in May 2022, 204 lbs August 2023, and weight has been stable over the last month weighing 208 lbs today. Recent CT A/P with contrast without findings to explain weight loss. Will plan for EGD and first ever colonoscopy in the near future.   Loose stool:  Chronic postprandial bowel movements with associated abdominal cramping that improves with a bowel movement with stools ranging from soft to mushy. No alarm symptoms. Suspect component of IBS. Will trial dicyclomine for symptom management. Will also screen for celiac disease and check thyroid function.    Plan:  CBC, CMP, INR, AFP, immunoglobulins, ANA, AMA, ASMA, ceruloplasmin, alpha 1 antitrypsin phenotype, Hep A ab total, Hep B surface Ab, Hep B surface Ag, Hep B core Ab, Hep C Ab, TSH, TTG IgA.  Proceed with upper endoscopy + TCS with propofol by Dr. Abbey Chatters in near future. The risks,  benefits, and alternatives have been discussed with the patient in detail. The patient states understanding and desires to proceed.  ASA 3 Will reach out to cardiology to get approval to hold Warfarin x 5 days prior to procedures.  Separate instructions provided for diabetes medication adjustments.  Nutrition:  High-protein diet from a primarily plant-based diet. Avoid red meat.  No raw or undercooked meat, seafood, or shellfish. Low-fat/cholesterol/carbohydrate diet. Limit sodium to no more than 2000 mg/day including everything that you eat and drink. Recommend at least 30 minutes of aerobic and resistance exercise 3 days/week. Patient will monitor for abdominal distension, LE edema, mental status changes, brbpr, and melena and let us know if this occurs.  Dicyclomine 10 mg. Start with once daily and increase up to 3 times daily before meals. Hold in setting of constipation.  Follow-up after procedures.    Aliene Altes, PA-C [facility] 09/27/2021

## 2021-09-27 ENCOUNTER — Ambulatory Visit (INDEPENDENT_AMBULATORY_CARE_PROVIDER_SITE_OTHER): Payer: BC Managed Care – PPO | Admitting: Gastroenterology

## 2021-09-27 ENCOUNTER — Telehealth: Payer: Self-pay | Admitting: *Deleted

## 2021-09-27 ENCOUNTER — Encounter: Payer: Self-pay | Admitting: Gastroenterology

## 2021-09-27 VITALS — BP 134/79 | HR 78 | Temp 98.0°F | Ht 68.0 in | Wt 208.6 lb

## 2021-09-27 DIAGNOSIS — R195 Other fecal abnormalities: Secondary | ICD-10-CM | POA: Diagnosis not present

## 2021-09-27 DIAGNOSIS — D696 Thrombocytopenia, unspecified: Secondary | ICD-10-CM | POA: Diagnosis not present

## 2021-09-27 DIAGNOSIS — D509 Iron deficiency anemia, unspecified: Secondary | ICD-10-CM

## 2021-09-27 DIAGNOSIS — R634 Abnormal weight loss: Secondary | ICD-10-CM | POA: Diagnosis not present

## 2021-09-27 DIAGNOSIS — K7460 Unspecified cirrhosis of liver: Secondary | ICD-10-CM

## 2021-09-27 DIAGNOSIS — Z1211 Encounter for screening for malignant neoplasm of colon: Secondary | ICD-10-CM

## 2021-09-27 DIAGNOSIS — R188 Other ascites: Secondary | ICD-10-CM

## 2021-09-27 MED ORDER — DICYCLOMINE HCL 10 MG PO CAPS: ORAL_CAPSULE | ORAL | 2 refills | Status: DC

## 2021-09-27 NOTE — Telephone Encounter (Addendum)
Covering preop today. Will route to pharm for input on anticoag.  Last OV 08/2021 reviewed, should be appropriate to proceed without VV

## 2021-09-27 NOTE — Patient Instructions (Addendum)
Please have blood work completed at [facility]  We will arrange for you to have an upper endoscopy and colonoscopy in the near future with Dr. Abbey Chatters. We will reach out to your cardiologist to get approval to hold warfarin for 5 days prior to procedure. 1 day prior to procedure: Take one half dose of metformin (500 mg in the morning), and take one half dose of NovoLog (30 units in the morning and 15 units at bedtime). Day of procedure: Do not take any morning diabetes medications.   Cirrhosis nutrition recommendations:  High-protein diet from a primarily plant-based diet. Avoid red meat.  No raw or undercooked meat, seafood, or shellfish. Low-fat/cholesterol/carbohydrate diet. Limit sodium to no more than 2000 mg/day including everything that you eat and drink. This is very important to prevent fluid accumulation in your abdomen or lower extremities.  Recommend at least 30 minutes of aerobic and resistance exercise 3 days/week.  If you develop swelling in your abdomen, lower extremities, mental status changes, blood in the stool, or black stool, please let me know.   Start dicyclomine once every morning to help with loose stools and abdominal cramping likely secondary to irritable bowel syndrome.  You can increase up to 3 times daily before meals as needed.  Hold in the setting of constipation.  We will follow-up with you in the office after your procedures.  Do not hesitate to call if you have any questions or concerns prior to your next visit.   Aliene Altes, PA-C [facility]

## 2021-09-27 NOTE — Telephone Encounter (Signed)
  What type of surgery is being performed? Colonoscopy/EGD  When is surgery scheduled? TBD  Clearance to hold warfarin 5 days prior  Name of physician performing surgery?  Dr. Hurshel Keys [facility] Phone: [phone] Fax: [phone]  Anethesia type (none, local, MAC, general)? MAC

## 2021-10-02 NOTE — Telephone Encounter (Signed)
Patient with diagnosis of mechanical aortic valve replacement on warfarin for anticoagulation.    Procedure: colonoscopy/EGD Date of procedure: TBD  CrCl 117 (adjusted body weight) Platelet count 61  Per office protocol, patient can hold warfarin for 5 days prior to procedure.   Patient will not need bridging with Lovenox (enoxaparin) around procedure.   **This guidance is not considered finalized until pre-operative APP has relayed final recommendations.**

## 2021-10-03 ENCOUNTER — Encounter: Payer: Self-pay | Admitting: *Deleted

## 2021-10-03 MED ORDER — PEG 3350-KCL-NA BICARB-NACL 420 G PO SOLR: 4000.0000 mL | Freq: Once | ORAL | 0 refills | Status: AC

## 2021-10-03 NOTE — Telephone Encounter (Signed)
Pt has been scheduled for 10/24/21 at 9:00 am for TCS/EGD ASA 3 with Dr.Carver. Instructions mailed and sent via MyChart per pt's request. Prep sent to the pharmacy

## 2021-10-03 NOTE — Telephone Encounter (Signed)
LMOVM to call back for pt to schedule TCS/EGD propofol, asa 3

## 2021-10-03 NOTE — Telephone Encounter (Signed)
Reviewed. Proceed with scheduling colonoscopy as planned and have patient hold Warfarin x 5 days. No need for bridging per cardiology.

## 2021-10-03 NOTE — Telephone Encounter (Signed)
   Patient Name: Anna Salinas  DOB: 10-20-1970 MRN: [id number]  Primary Cardiologist: Peter Martinique, MD  Chart reviewed as part of pre-operative protocol coverage. Based on chart review patient may proceed with colonoscopy/EGD as requested.   Per pharmD, "per office protocol, patient can hold warfarin for 5 days prior to procedure.  Patient will not need bridging with Lovenox (enoxaparin) around procedure."  Will route this bundled recommendation to requesting provider via Epic fax function. Please call with questions.  Charlie Pitter, PA-C 10/03/2021, 8:31 AM

## 2021-10-05 ENCOUNTER — Inpatient Hospital Stay: Payer: BC Managed Care – PPO | Attending: Physician Assistant

## 2021-10-05 VITALS — BP 120/65 | HR 65 | Temp 98.5°F | Resp 16

## 2021-10-05 DIAGNOSIS — D509 Iron deficiency anemia, unspecified: Secondary | ICD-10-CM | POA: Diagnosis present

## 2021-10-05 DIAGNOSIS — E611 Iron deficiency: Secondary | ICD-10-CM

## 2021-10-05 MED ORDER — LORATADINE 10 MG PO TABS
10.0000 mg | ORAL_TABLET | Freq: Once | ORAL | Status: AC
  Administered 2021-10-05: 10 mg via ORAL
  Filled 2021-10-05: qty 1

## 2021-10-05 MED ORDER — SODIUM CHLORIDE 0.9 % IV SOLN: Freq: Once | INTRAVENOUS | Status: AC

## 2021-10-05 MED ORDER — ACETAMINOPHEN 325 MG PO TABS
650.0000 mg | ORAL_TABLET | Freq: Once | ORAL | Status: AC
  Administered 2021-10-05: 650 mg via ORAL
  Filled 2021-10-05: qty 2

## 2021-10-05 MED ORDER — SODIUM CHLORIDE 0.9 % IV SOLN
300.0000 mg | Freq: Once | INTRAVENOUS | Status: AC
  Administered 2021-10-05: 300 mg via INTRAVENOUS
  Filled 2021-10-05: qty 300

## 2021-10-05 NOTE — Patient Instructions (Signed)
[facility]  Discharge Instructions: Thank you for choosing [facility] to provide your oncology and hematology care.  If you have a lab appointment with the Cancer Center, please come in thru the Main Entrance and check in at the main information desk.  Wear comfortable clothing and clothing appropriate for easy access to any Portacath or PICC line.   We strive to give you quality time with your provider. You may need to reschedule your appointment if you arrive late (15 or more minutes).  Arriving late affects you and other patients whose appointments are after yours.  Also, if you miss three or more appointments without notifying the office, you may be dismissed from the clinic at the provider's discretion.      For prescription refill requests, have your pharmacy contact our office and allow 72 hours for refills to be completed.    Today you received the following, iron infusion   To help prevent nausea and vomiting after your treatment, we encourage you to take your nausea medication as directed.  BELOW ARE SYMPTOMS THAT SHOULD BE REPORTED IMMEDIATELY: *FEVER GREATER THAN 100.4 F (38 C) OR HIGHER *CHILLS OR SWEATING *NAUSEA AND VOMITING THAT IS NOT CONTROLLED WITH YOUR NAUSEA MEDICATION *UNUSUAL SHORTNESS OF BREATH *UNUSUAL BRUISING OR BLEEDING *URINARY PROBLEMS (pain or burning when urinating, or frequent urination) *BOWEL PROBLEMS (unusual diarrhea, constipation, pain near the anus) TENDERNESS IN MOUTH AND THROAT WITH OR WITHOUT PRESENCE OF ULCERS (sore throat, sores in mouth, or a toothache) UNUSUAL RASH, SWELLING OR PAIN  UNUSUAL VAGINAL DISCHARGE OR ITCHING   Items with * indicate a potential emergency and should be followed up as soon as possible or go to the Emergency Department if any problems should occur.  Please show the CHEMOTHERAPY ALERT CARD or IMMUNOTHERAPY ALERT CARD at check-in to the Emergency Department and triage  nurse.  Should you have questions after your visit or need to cancel or reschedule your appointment, please contact [facility] [phone]  and follow the prompts.  Office hours are 8:00 a.m. to 4:30 p.m. Monday - Friday. Please note that voicemails left after 4:00 p.m. may not be returned until the following business day.  We are closed weekends and major holidays. You have access to a nurse at all times for urgent questions. Please call the main number to the clinic [phone] and follow the prompts.  For any non-urgent questions, you may also contact your provider using MyChart. We now offer e-Visits for anyone 18 and older to request care online for non-urgent symptoms. For details visit [email or web address].   Also download the MyChart app! Go to the app store, search "MyChart", open the app, select [facility], and log in with your MyChart username and password.  Masks are optional in the cancer centers. If you would like for your care team to wear a mask while they are taking care of you, please let them know. You may have one support person who is at least 49 years old accompany you for your appointments.  

## 2021-10-05 NOTE — Progress Notes (Signed)
Iron infusion given per orders. Patient tolerated it well without problems. Vitals stable and discharged home from clinic ambulatory. Follow up as scheduled.  

## 2021-10-06 LAB — CERULOPLASMIN: Ceruloplasmin: 26 mg/dL (ref 18–53)

## 2021-10-06 LAB — COMPLETE METABOLIC PANEL WITH GFR
AG Ratio: 1.8 (calc) (ref 1.0–2.5)
ALT: 28 U/L (ref 6–29)
AST: 26 U/L (ref 10–35)
Albumin: 4.2 g/dL (ref 3.6–5.1)
Alkaline phosphatase (APISO): 78 U/L (ref 31–125)
BUN: 12 mg/dL (ref 7–25)
CO2: 29 mmol/L (ref 20–32)
Calcium: 9.2 mg/dL (ref 8.6–10.2)
Chloride: 102 mmol/L (ref 98–110)
Creat: 0.7 mg/dL (ref 0.50–0.99)
Globulin: 2.3 g/dL (calc) (ref 1.9–3.7)
Glucose, Bld: 223 mg/dL — ABNORMAL HIGH (ref 65–139)
Potassium: 3.5 mmol/L (ref 3.5–5.3)
Sodium: 140 mmol/L (ref 135–146)
Total Bilirubin: 0.9 mg/dL (ref 0.2–1.2)
Total Protein: 6.5 g/dL (ref 6.1–8.1)
eGFR: 106 mL/min/{1.73_m2} (ref >=60)

## 2021-10-06 LAB — CBC WITH DIFFERENTIAL/PLATELET
Absolute Monocytes: 310 cells/uL (ref 200–950)
Basophils Absolute: 22 cells/uL (ref 0–200)
Basophils Relative: 0.5 %
Eosinophils Absolute: 69 cells/uL (ref 15–500)
Eosinophils Relative: 1.6 %
HCT: 40.1 % (ref 35.0–45.0)
Hemoglobin: 13.1 g/dL (ref 11.7–15.5)
Lymphs Abs: 976 cells/uL (ref 850–3900)
MCH: 28.1 pg (ref 27.0–33.0)
MCHC: 32.7 g/dL (ref 32.0–36.0)
MCV: 85.9 fL (ref 80.0–100.0)
MPV: 12.7 fL — ABNORMAL HIGH (ref 7.5–12.5)
Monocytes Relative: 7.2 %
Neutro Abs: 2924 cells/uL (ref 1500–7800)
Neutrophils Relative %: 68 %
Platelets: 61 10*3/uL — ABNORMAL LOW (ref 140–400)
RBC: 4.67 10*6/uL (ref 3.80–5.10)
RDW: 13.6 % (ref 11.0–15.0)
Total Lymphocyte: 22.7 %
WBC: 4.3 10*3/uL (ref 3.8–10.8)

## 2021-10-06 LAB — TSH: TSH: 0.79 mIU/L

## 2021-10-06 LAB — ANTI-NUCLEAR AB-TITER (ANA TITER): ANA Titer 1: 1:320 {titer} — ABNORMAL HIGH

## 2021-10-06 LAB — IGG, IGA, IGM
IgG (Immunoglobin G), Serum: 871 mg/dL (ref 600–1640)
IgM, Serum: 198 mg/dL (ref 50–300)
Immunoglobulin A: 209 mg/dL (ref 47–310)

## 2021-10-06 LAB — HEPATITIS A ANTIBODY, TOTAL: Hepatitis A AB,Total: NONREACTIVE

## 2021-10-06 LAB — ALPHA-1 ANTITRYPSIN PHENOTYPE: A-1 Antitrypsin, Ser: 127 mg/dL (ref 83–199)

## 2021-10-06 LAB — TISSUE TRANSGLUTAMINASE, IGA: (tTG) Ab, IgA: <1 U/mL

## 2021-10-06 LAB — MITOCHONDRIAL ANTIBODIES: Mitochondrial M2 Ab, IgG: <=20 U (ref <=20.0)

## 2021-10-06 LAB — HEPATITIS C ANTIBODY: Hepatitis C Ab: NONREACTIVE

## 2021-10-06 LAB — PROTIME-INR
INR: 2.9 — ABNORMAL HIGH
Prothrombin Time: 28.6 s — ABNORMAL HIGH (ref 9.0–11.5)

## 2021-10-06 LAB — HEPATITIS B SURFACE ANTIBODY,QUALITATIVE: Hep B S Ab: NONREACTIVE

## 2021-10-06 LAB — ANA: Anti Nuclear Antibody (ANA): POSITIVE — AB

## 2021-10-06 LAB — AFP TUMOR MARKER: AFP-Tumor Marker: 4.8 ng/mL

## 2021-10-06 LAB — HEPATITIS B SURFACE ANTIGEN: Hepatitis B Surface Ag: NONREACTIVE

## 2021-10-06 LAB — HEPATITIS B CORE ANTIBODY, TOTAL: Hep B Core Total Ab: NONREACTIVE

## 2021-10-06 LAB — ANTI-SMOOTH MUSCLE ANTIBODY, IGG: Actin (Smooth Muscle) Antibody (IGG): <20 U (ref <20)

## 2021-10-11 MED FILL — Iron Sucrose Inj 20 MG/ML (Fe Equiv): INTRAVENOUS | Qty: 15 | Status: AC

## 2021-10-12 ENCOUNTER — Inpatient Hospital Stay: Payer: BC Managed Care – PPO

## 2021-10-12 VITALS — BP 120/79 | HR 67 | Temp 96.9°F | Resp 18

## 2021-10-12 DIAGNOSIS — D509 Iron deficiency anemia, unspecified: Secondary | ICD-10-CM | POA: Diagnosis not present

## 2021-10-12 DIAGNOSIS — E611 Iron deficiency: Secondary | ICD-10-CM

## 2021-10-12 MED ORDER — ACETAMINOPHEN 325 MG PO TABS: 650.0000 mg | ORAL_TABLET | Freq: Once | ORAL | Status: DC

## 2021-10-12 MED ORDER — LORATADINE 10 MG PO TABS: 10.0000 mg | ORAL_TABLET | Freq: Once | ORAL | Status: DC

## 2021-10-12 MED ORDER — SODIUM CHLORIDE 0.9 % IV SOLN
300.0000 mg | Freq: Once | INTRAVENOUS | Status: AC
  Administered 2021-10-12: 300 mg via INTRAVENOUS
  Filled 2021-10-12: qty 300

## 2021-10-12 MED ORDER — SODIUM CHLORIDE 0.9 % IV SOLN: Freq: Once | INTRAVENOUS | Status: AC

## 2021-10-12 NOTE — Progress Notes (Signed)
Patient presents today for Venofer infusion per providers order.  Vital signs WNL.  Patient has no new complaints at this time.  Patient took premedications at home. Peripheral IV started and blood return noted pre and post infusion.  Stable during infusion without adverse affects.  Vital signs stable.  No complaints at this time.  Discharge from clinic ambulatory in stable condition.  Alert and oriented X 3.  Follow up with [facility] as scheduled.

## 2021-10-12 NOTE — Patient Instructions (Signed)
[facility]  Discharge Instructions: Thank you for choosing [facility] to provide your oncology and hematology care.  If you have a lab appointment with the Cancer Center, please come in thru the Main Entrance and check in at the main information desk.  Wear comfortable clothing and clothing appropriate for easy access to any Portacath or PICC line.   We strive to give you quality time with your provider. You may need to reschedule your appointment if you arrive late (15 or more minutes).  Arriving late affects you and other patients whose appointments are after yours.  Also, if you miss three or more appointments without notifying the office, you may be dismissed from the clinic at the provider's discretion.      For prescription refill requests, have your pharmacy contact our office and allow 72 hours for refills to be completed.    Today you received the following chemotherapy and/or immunotherapy agents Venofer      To help prevent nausea and vomiting after your treatment, we encourage you to take your nausea medication as directed.  BELOW ARE SYMPTOMS THAT SHOULD BE REPORTED IMMEDIATELY: *FEVER GREATER THAN 100.4 F (38 C) OR HIGHER *CHILLS OR SWEATING *NAUSEA AND VOMITING THAT IS NOT CONTROLLED WITH YOUR NAUSEA MEDICATION *UNUSUAL SHORTNESS OF BREATH *UNUSUAL BRUISING OR BLEEDING *URINARY PROBLEMS (pain or burning when urinating, or frequent urination) *BOWEL PROBLEMS (unusual diarrhea, constipation, pain near the anus) TENDERNESS IN MOUTH AND THROAT WITH OR WITHOUT PRESENCE OF ULCERS (sore throat, sores in mouth, or a toothache) UNUSUAL RASH, SWELLING OR PAIN  UNUSUAL VAGINAL DISCHARGE OR ITCHING   Items with * indicate a potential emergency and should be followed up as soon as possible or go to the Emergency Department if any problems should occur.  Please show the CHEMOTHERAPY ALERT CARD or IMMUNOTHERAPY ALERT CARD at check-in to the Emergency  Department and triage nurse.  Should you have questions after your visit or need to cancel or reschedule your appointment, please contact [facility] [phone]  and follow the prompts.  Office hours are 8:00 a.m. to 4:30 p.m. Monday - Friday. Please note that voicemails left after 4:00 p.m. may not be returned until the following business day.  We are closed weekends and major holidays. You have access to a nurse at all times for urgent questions. Please call the main number to the clinic [phone] and follow the prompts.  For any non-urgent questions, you may also contact your provider using MyChart. We now offer e-Visits for anyone 18 and older to request care online for non-urgent symptoms. For details visit [email or web address].   Also download the MyChart app! Go to the app store, search "MyChart", open the app, select [facility], and log in with your MyChart username and password.  Masks are optional in the cancer centers. If you would like for your care team to wear a mask while they are taking care of you, please let them know. You may have one support person who is at least 49 years old accompany you for your appointments.  

## 2021-10-19 ENCOUNTER — Inpatient Hospital Stay: Payer: BC Managed Care – PPO | Attending: Physician Assistant

## 2021-10-19 VITALS — BP 116/53 | HR 62 | Temp 98.2°F | Resp 18

## 2021-10-19 DIAGNOSIS — E611 Iron deficiency: Secondary | ICD-10-CM | POA: Insufficient documentation

## 2021-10-19 MED ORDER — SODIUM CHLORIDE 0.9 % IV SOLN: Freq: Once | INTRAVENOUS | Status: AC

## 2021-10-19 MED ORDER — SODIUM CHLORIDE 0.9 % IV SOLN
300.0000 mg | Freq: Once | INTRAVENOUS | Status: AC
  Administered 2021-10-19: 300 mg via INTRAVENOUS
  Filled 2021-10-19: qty 300

## 2021-10-19 NOTE — Patient Instructions (Signed)
[facility]  Discharge Instructions: Thank you for choosing [facility] to provide your oncology and hematology care.  If you have a lab appointment with the Cancer Center, please come in thru the Main Entrance and check in at the main information desk.  Wear comfortable clothing and clothing appropriate for easy access to any Portacath or PICC line.   We strive to give you quality time with your provider. You may need to reschedule your appointment if you arrive late (15 or more minutes).  Arriving late affects you and other patients whose appointments are after yours.  Also, if you miss three or more appointments without notifying the office, you may be dismissed from the clinic at the provider's discretion.      For prescription refill requests, have your pharmacy contact our office and allow 72 hours for refills to be completed.     To help prevent nausea and vomiting after your treatment, we encourage you to take your nausea medication as directed.  BELOW ARE SYMPTOMS THAT SHOULD BE REPORTED IMMEDIATELY: *FEVER GREATER THAN 100.4 F (38 C) OR HIGHER *CHILLS OR SWEATING *NAUSEA AND VOMITING THAT IS NOT CONTROLLED WITH YOUR NAUSEA MEDICATION *UNUSUAL SHORTNESS OF BREATH *UNUSUAL BRUISING OR BLEEDING *URINARY PROBLEMS (pain or burning when urinating, or frequent urination) *BOWEL PROBLEMS (unusual diarrhea, constipation, pain near the anus) TENDERNESS IN MOUTH AND THROAT WITH OR WITHOUT PRESENCE OF ULCERS (sore throat, sores in mouth, or a toothache) UNUSUAL RASH, SWELLING OR PAIN  UNUSUAL VAGINAL DISCHARGE OR ITCHING   Items with * indicate a potential emergency and should be followed up as soon as possible or go to the Emergency Department if any problems should occur.  Please show the CHEMOTHERAPY ALERT CARD or IMMUNOTHERAPY ALERT CARD at check-in to the Emergency Department and triage nurse.  Should you have questions after your visit or need to  cancel or reschedule your appointment, please contact [facility] [phone]  and follow the prompts.  Office hours are 8:00 a.m. to 4:30 p.m. Monday - Friday. Please note that voicemails left after 4:00 p.m. may not be returned until the following business day.  We are closed weekends and major holidays. You have access to a nurse at all times for urgent questions. Please call the main number to the clinic [phone] and follow the prompts.  For any non-urgent questions, you may also contact your provider using MyChart. We now offer e-Visits for anyone 18 and older to request care online for non-urgent symptoms. For details visit [email or web address].   Also download the MyChart app! Go to the app store, search "MyChart", open the app, select [facility], and log in with your MyChart username and password.  Masks are optional in the cancer centers. If you would like for your care team to wear a mask while they are taking care of you, please let them know. You may have one support person who is at least 49 years old accompany you for your appointments.  

## 2021-10-19 NOTE — Progress Notes (Signed)
Patient presents today for iron infusion.  Patient is in satisfactory condition with no complaints voiced.  Vital signs are stable.  Pre-medications were taken at 0745 at home prior to visit.  We will proceed with infusion per provider orders.  Patient tolerated treatment well with no complaints voiced.  Patient left ambulatory in stable condition.  Vital signs stable at discharge.  Follow up as scheduled.

## 2021-10-22 ENCOUNTER — Encounter (HOSPITAL_COMMUNITY): Payer: Self-pay

## 2021-10-22 ENCOUNTER — Encounter (HOSPITAL_COMMUNITY)
Admission: RE | Admit: 2021-10-22 | Discharge: 2021-10-22 | Disposition: A | Discharge status: Home / Self Care | Payer: BC Managed Care – PPO | Source: Ambulatory Visit | Attending: Internal Medicine | Admitting: Internal Medicine

## 2021-10-22 NOTE — Patient Instructions (Signed)
Anna Salinas  10/22/2021     '@[email or web address]'$ @   Your procedure is scheduled on 10/24/2021.  Report to [facility] at 7:15 A.M.  Call this number if you have problems the morning of surgery:  [phone]   Remember:  Please follow the diet and prep instructions given to you by Dr Ave Filter office.     Take these medicines the morning of surgery with A SIP OF WATER : none    Do not wear jewelry, make-up or nail polish.  Do not wear lotions, powders, or perfumes, or deodorant.  Do not shave 48 hours prior to surgery.  Men may shave face and neck.  Do not bring valuables to the hospital.  [facility] is not responsible for any belongings or valuables.  Contacts, dentures or bridgework may not be worn into surgery.  Leave your suitcase in the car.  After surgery it may be brought to your room.  For patients admitted to the hospital, discharge time will be determined by your treatment team.  Patients discharged the day of surgery will not be allowed to drive home.   Name and phone number of your driver:   family Special instructions:  N/A  Please read over the following fact sheets that you were given. Care and Recovery After Surgery  Upper Endoscopy, Adult Upper endoscopy is a procedure to look inside the upper GI (gastrointestinal) tract. The upper GI tract is made up of: The esophagus. This is the part of the body that moves food from your mouth to your stomach. The stomach. The duodenum. This is the first part of your small intestine. This procedure is also called esophagogastroduodenoscopy (EGD) or gastroscopy. In this procedure, your health care provider passes a thin, flexible tube (endoscope) through your mouth and down your esophagus into your stomach and into your duodenum. A small camera is attached to the end of the tube. Images from the camera appear on a monitor in the exam room. During this procedure, your health care provider may also remove a small piece of  tissue to be sent to a lab and examined under a microscope (biopsy). Your health care provider may do an upper endoscopy to diagnose cancers of the upper GI tract. You may also have this procedure to find the cause of other conditions, such as: Stomach pain. Heartburn. Pain or problems when swallowing. Nausea and vomiting. Stomach bleeding. Stomach ulcers. Tell a health care provider about: Any allergies you have. All medicines you are taking, including vitamins, herbs, eye drops, creams, and over-the-counter medicines. Any problems you or family members have had with anesthetic medicines. Any bleeding problems you have. Any surgeries you have had. Any medical conditions you have. Whether you are pregnant or may be pregnant. What are the risks? Your healthcare provider will talk with you about risks. These may include: Infection. Bleeding. Allergic reactions to medicines. A tear or hole (perforation) in the esophagus, stomach, or duodenum. What happens before the procedure? When to stop eating and drinking Follow instructions from your health care provider about what you may eat and drink. These may include: 8 hours before your procedure Stop eating most foods. Do not eat meat, fried foods, or fatty foods. Eat only light foods, such as toast or crackers. All liquids are okay except energy drinks and alcohol. 6 hours before your procedure Stop eating. Drink only clear liquids, such as water, clear fruit juice, black coffee, plain tea, and sports drinks. Do not drink energy drinks or  alcohol. 2 hours before your procedure Stop drinking all liquids. You may be allowed to take medicines with small sips of water. If you do not follow your health care provider's instructions, your procedure may be delayed or canceled. Medicines Ask your health care provider about: Changing or stopping your regular medicines. This is especially important if you are taking diabetes medicines or blood  thinners. Taking medicines such as aspirin and ibuprofen. These medicines can thin your blood. Do not take these medicines unless your health care provider tells you to take them. Taking over-the-counter medicines, vitamins, herbs, and supplements. General instructions If you will be going home right after the procedure, plan to have a responsible adult: Take you home from the hospital or clinic. You will not be allowed to drive. Care for you for the time you are told. What happens during the procedure?  An IV will be inserted into one of your veins. You may be given one or more of the following: A medicine to help you relax (sedative). A medicine to numb the throat (local anesthetic). You will lie on your left side on an exam table. Your health care provider will pass the endoscope through your mouth and down your esophagus. Your health care provider will use the scope to check the inside of your esophagus, stomach, and duodenum. Biopsies may be taken. The endoscope will be removed. The procedure may vary among health care providers and hospitals. What happens after the procedure? Your blood pressure, heart rate, breathing rate, and blood oxygen level will be monitored until you leave the hospital or clinic. When your throat is no longer numb, you may be given some fluids to drink. If you were given a sedative during the procedure, it can affect you for several hours. Do not drive or operate machinery until your health care provider says that it is safe. It is up to you to get the results of your procedure. Ask your health care provider, or the department that is doing the procedure, when your results will be ready. Contact a health care provider if you: Have a sore throat that lasts longer than 1 day. Have a fever. Get help right away if you: Vomit blood or your vomit looks like coffee grounds. Have bloody, black, or tarry stools. Have a very bad sore throat or you cannot  swallow. Have difficulty breathing or very bad pain in your chest or abdomen. These symptoms may be an emergency. Get help right away. Call 911. Do not wait to see if the symptoms will go away. Do not drive yourself to the hospital. Summary Upper endoscopy is a procedure to look inside the upper GI tract. During the procedure, an IV will be inserted into one of your veins. You may be given a medicine to help you relax. The endoscope will be passed through your mouth and down your esophagus. Follow instructions from your health care provider about what you can eat and drink. This information is not intended to replace advice given to you by your health care provider. Make sure you discuss any questions you have with your health care provider. Document Revised: 04/11/2021 Document Reviewed: 04/11/2021 Elsevier Patient Education  [place]. Colonoscopy, Adult A colonoscopy is a procedure to look at the entire large intestine. This procedure is done using a long, thin, flexible tube that has a camera on the end. You may have a colonoscopy: As a part of normal colorectal screening. If you have certain symptoms, such as: A  low number of red blood cells in your blood (anemia). Diarrhea that does not go away. Pain in your abdomen. Blood in your stool. A colonoscopy can help screen for and diagnose medical problems, including: An abnormal growth of cells or tissue (tumor). Abnormal growths within the lining of your intestine (polyps). Inflammation. Areas of bleeding. Tell your health care provider about: Any allergies you have. All medicines you are taking, including vitamins, herbs, eye drops, creams, and over-the-counter medicines. Any problems you or family members have had with anesthetic medicines. Any bleeding problems you have. Any surgeries you have had. Any medical conditions you have. Any problems you have had with having bowel movements. Whether you are pregnant or may  be pregnant. What are the risks? Generally, this is a safe procedure. However, problems may occur, including: Bleeding. Damage to your intestine. Allergic reactions to medicines given during the procedure. Infection. This is rare. What happens before the procedure? Eating and drinking restrictions Follow instructions from your health care provider about eating or drinking restrictions, which may include: A few days before the procedure: Follow a low-fiber diet. Avoid nuts, seeds, dried fruit, raw fruits, and vegetables. 1-3 days before the procedure: Eat only gelatin dessert or ice pops. Drink only clear liquids, such as water, clear juice, clear broth or bouillon, black coffee or tea, or clear soft drinks or sports drinks. Avoid liquids that contain red or purple dye. The day of the procedure: Do not eat solid foods. You may continue to drink clear liquids until up to 2 hours before the procedure. Do not eat or drink anything starting 2 hours before the procedure, or within the time period that your health care provider recommends. Bowel prep If you were prescribed a bowel prep to take by mouth (orally) to clean out your colon: Take it as told by your health care provider. Starting the day before your procedure, you will need to drink a large amount of liquid medicine. The liquid will cause you to have many bowel movements of loose stool until your stool becomes almost clear or light green. If your skin or the opening between the buttocks (anus) gets irritated from diarrhea, you may relieve the irritation using: Wipes with medicine in them, such as adult wet wipes with aloe and vitamin E. A product to soothe skin, such as petroleum jelly. If you vomit while drinking the bowel prep: Take a break for up to 60 minutes. Begin the bowel prep again. Call your health care provider if you keep vomiting or you cannot take the bowel prep without vomiting. To clean out your colon, you may also be  given: Laxative medicines. These help you have a bowel movement. Instructions for enema use. An enema is liquid medicine injected into your rectum. Medicines Ask your health care provider about: Changing or stopping your regular medicines or supplements. This is especially important if you are taking iron supplements, diabetes medicines, or blood thinners. Taking medicines such as aspirin and ibuprofen. These medicines can thin your blood. Do not take these medicines unless your health care provider tells you to take them. Taking over-the-counter medicines, vitamins, herbs, and supplements. General instructions Ask your health care provider what steps will be taken to help prevent infection. These may include washing skin with a germ-killing soap. If you will be going home right after the procedure, plan to have a responsible adult: Take you home from the hospital or clinic. You will not be allowed to drive. Care for you for the  time you are told. What happens during the procedure?  An IV will be inserted into one of your veins. You will be given a medicine to make you fall asleep (general anesthetic). You will lie on your side with your knees bent. A lubricant will be put on the tube. Then the tube will be: Inserted into your anus. Gently eased through all parts of your large intestine. Air will be sent into your colon to keep it open. This may cause some pressure or cramping. Images will be taken with the camera and will appear on a screen. A small tissue sample may be removed to be looked at under a microscope (biopsy). The tissue may be sent to a lab for testing if any signs of problems are found. If small polyps are found, they may be removed and checked for cancer cells. When the procedure is finished, the tube will be removed. The procedure may vary among health care providers and hospitals. What happens after the procedure? Your blood pressure, heart rate, breathing rate, and  blood oxygen level will be monitored until you leave the hospital or clinic. You may have a small amount of blood in your stool. You may pass gas and have mild cramping or bloating in your abdomen. This is caused by the air that was used to open your colon during the exam. If you were given a sedative during the procedure, it can affect you for several hours. Do not drive or operate machinery until your health care provider says that it is safe. It is up to you to get the results of your procedure. Ask your health care provider, or the department that is doing the procedure, when your results will be ready. Summary A colonoscopy is a procedure to look at the entire large intestine. Follow instructions from your health care provider about eating and drinking before the procedure. If you were prescribed an oral bowel prep to clean out your colon, take it as told by your health care provider. During the colonoscopy, a flexible tube with a camera on its end is inserted into the anus and then passed into all parts of the large intestine. This information is not intended to replace advice given to you by your health care provider. Make sure you discuss any questions you have with your health care provider. Document Revised: 12/25/2020 Document Reviewed: 08/23/2020 Elsevier Patient Education  [place] Anesthesia refers to techniques, procedures, and medicines that help a person stay safe and comfortable during a medical or dental procedure. Monitored anesthesia care, or sedation, is one type of anesthesia. Your anesthesia specialist may recommend sedation if you will be having a procedure that does not require you to be unconscious. You may have this procedure for: Cataract surgery. A dental procedure. A biopsy. A colonoscopy. During the procedure, you may receive a medicine to help you relax (sedative). There are three levels of sedation: Mild sedation. At this  level, you may feel awake and relaxed. You will be able to follow directions. Moderate sedation. At this level, you will be sleepy. You may not remember the procedure. Deep sedation. At this level, you will be asleep. You will not remember the procedure. The more medicine you are given, the deeper your level of sedation will be. Depending on how you respond to the procedure, the anesthesia specialist may change your level of sedation or the type of anesthesia to fit your needs. An anesthesia specialist will monitor you closely  during the procedure. Tell a health care provider about: Any allergies you have. All medicines you are taking, including vitamins, herbs, eye drops, creams, and over-the-counter medicines. Any problems you or family members have had with anesthetic medicines. Any blood disorders you have. Any surgeries you have had. Any medical conditions you have, such as sleep apnea. Whether you are pregnant or may be pregnant. Whether you use cigarettes, alcohol, or drugs. Any use of steroids, whether by mouth or as a cream. What are the risks? Generally, this is a safe procedure. However, problems may occur, including: Getting too much medicine (oversedation). Nausea. Allergic reaction to medicines. Trouble breathing. If this happens, a breathing tube may be used to help with breathing. It will be removed when you are awake and breathing on your own. Heart trouble. Lung trouble. Confusion that gets better with time (emergence delirium). What happens before the procedure? Staying hydrated Follow instructions from your health care provider about hydration, which may include: Up to 2 hours before the procedure - you may continue to drink clear liquids, such as water, clear fruit juice, black coffee, and plain tea. Eating and drinking restrictions Follow instructions from your health care provider about eating and drinking, which may include: 8 hours before the procedure - stop  eating heavy meals or foods, such as meat, fried foods, or fatty foods. 6 hours before the procedure - stop eating light meals or foods, such as toast or cereal. 6 hours before the procedure - stop drinking milk or drinks that contain milk. 2 hours before the procedure - stop drinking clear liquids. Medicines Ask your health care provider about: Changing or stopping your regular medicines. This is especially important if you are taking diabetes medicines or blood thinners. Taking medicines such as aspirin and ibuprofen. These medicines can thin your blood. Do not take these medicines unless your health care provider tells you to take them. Taking over-the-counter medicines, vitamins, herbs, and supplements. Tests and exams You will have a physical exam. You may have blood tests done to show: How well your kidneys and liver are working. How well your blood can clot. General instructions Plan to have a responsible adult take you home from the hospital or clinic. If you will be going home right after the procedure, plan to have a responsible adult care for you for the time you are told. This is important. What happens during the procedure?  Your blood pressure, heart rate, breathing, level of pain, and overall condition will be monitored. An IV will be inserted into one of your veins. You will be given medicines as needed to keep you comfortable during the procedure. This may mean changing the level of sedation. Depending on your age or the procedure, the sedative may be given: As a pill that you will swallow or as a pill that is inserted into the rectum. As an injection into the vein or muscle. As a spray through the nose. The procedure will be performed. Your breathing, heart rate, and blood pressure will be monitored during the procedure. When the procedure is over, the medicine will be stopped. The procedure may vary among health care providers and hospitals. What happens after the  procedure? Your blood pressure, heart rate, breathing rate, and blood oxygen level will be monitored until you leave the hospital or clinic. You may feel sleepy, clumsy, or nauseous. You may feel forgetful about what happened after the procedure. You may vomit. You may continue to get IV fluids. Do not drive  or operate machinery until your health care provider says that it is safe. Summary Monitored anesthesia care is used to keep a patient comfortable during short procedures. Tell your health care provider about any allergies or health conditions you have and about all the medicines you are taking. Before the procedure, follow instructions about when to stop eating and drinking and about changing or stopping any medicines. Your blood pressure, heart rate, breathing rate, and blood oxygen level will be monitored until you leave the hospital or clinic. Plan to have a responsible adult take you home from the hospital or clinic. This information is not intended to replace advice given to you by your health care provider. Make sure you discuss any questions you have with your health care provider. Document Revised: 12/05/2020 Document Reviewed: 12/03/2018 Elsevier Patient Education  [place].

## 2021-10-24 ENCOUNTER — Ambulatory Visit (HOSPITAL_COMMUNITY): Payer: BC Managed Care – PPO | Admitting: Anesthesiology

## 2021-10-24 ENCOUNTER — Ambulatory Visit (HOSPITAL_COMMUNITY)
Admission: RE | Admit: 2021-10-24 | Discharge: 2021-10-24 | Disposition: A | Discharge status: Home / Self Care | Payer: BC Managed Care – PPO | Attending: Internal Medicine | Admitting: Internal Medicine

## 2021-10-24 ENCOUNTER — Encounter (HOSPITAL_COMMUNITY): Payer: Self-pay

## 2021-10-24 ENCOUNTER — Other Ambulatory Visit: Payer: Self-pay

## 2021-10-24 ENCOUNTER — Encounter (HOSPITAL_COMMUNITY)
Admission: RE | Disposition: A | Discharge status: Home / Self Care | Payer: Self-pay | Source: Home / Self Care | Attending: Internal Medicine

## 2021-10-24 DIAGNOSIS — K529 Noninfective gastroenteritis and colitis, unspecified: Secondary | ICD-10-CM | POA: Insufficient documentation

## 2021-10-24 DIAGNOSIS — I8510 Secondary esophageal varices without bleeding: Secondary | ICD-10-CM | POA: Diagnosis not present

## 2021-10-24 DIAGNOSIS — K3189 Other diseases of stomach and duodenum: Secondary | ICD-10-CM | POA: Insufficient documentation

## 2021-10-24 DIAGNOSIS — K648 Other hemorrhoids: Secondary | ICD-10-CM | POA: Insufficient documentation

## 2021-10-24 DIAGNOSIS — Z952 Presence of prosthetic heart valve: Secondary | ICD-10-CM | POA: Diagnosis not present

## 2021-10-24 DIAGNOSIS — E119 Type 2 diabetes mellitus without complications: Secondary | ICD-10-CM | POA: Diagnosis not present

## 2021-10-24 DIAGNOSIS — I10 Essential (primary) hypertension: Secondary | ICD-10-CM | POA: Insufficient documentation

## 2021-10-24 DIAGNOSIS — Z794 Long term (current) use of insulin: Secondary | ICD-10-CM | POA: Insufficient documentation

## 2021-10-24 DIAGNOSIS — Z90710 Acquired absence of both cervix and uterus: Secondary | ICD-10-CM | POA: Diagnosis not present

## 2021-10-24 DIAGNOSIS — K7460 Unspecified cirrhosis of liver: Secondary | ICD-10-CM | POA: Insufficient documentation

## 2021-10-24 DIAGNOSIS — D509 Iron deficiency anemia, unspecified: Secondary | ICD-10-CM | POA: Diagnosis not present

## 2021-10-24 DIAGNOSIS — K2950 Unspecified chronic gastritis without bleeding: Secondary | ICD-10-CM | POA: Insufficient documentation

## 2021-10-24 DIAGNOSIS — Z7984 Long term (current) use of oral hypoglycemic drugs: Secondary | ICD-10-CM | POA: Insufficient documentation

## 2021-10-24 DIAGNOSIS — K635 Polyp of colon: Secondary | ICD-10-CM | POA: Diagnosis not present

## 2021-10-24 DIAGNOSIS — K2970 Gastritis, unspecified, without bleeding: Secondary | ICD-10-CM

## 2021-10-24 DIAGNOSIS — D696 Thrombocytopenia, unspecified: Secondary | ICD-10-CM | POA: Diagnosis not present

## 2021-10-24 DIAGNOSIS — K766 Portal hypertension: Secondary | ICD-10-CM | POA: Diagnosis not present

## 2021-10-24 DIAGNOSIS — K760 Fatty (change of) liver, not elsewhere classified: Secondary | ICD-10-CM | POA: Insufficient documentation

## 2021-10-24 DIAGNOSIS — Z1211 Encounter for screening for malignant neoplasm of colon: Secondary | ICD-10-CM

## 2021-10-24 DIAGNOSIS — D120 Benign neoplasm of cecum: Secondary | ICD-10-CM | POA: Insufficient documentation

## 2021-10-24 DIAGNOSIS — R634 Abnormal weight loss: Secondary | ICD-10-CM

## 2021-10-24 HISTORY — PX: COLONOSCOPY WITH PROPOFOL: SHX5780

## 2021-10-24 HISTORY — PX: ESOPHAGOGASTRODUODENOSCOPY (EGD) WITH PROPOFOL: SHX5813

## 2021-10-24 HISTORY — PX: BIOPSY: SHX5522

## 2021-10-24 HISTORY — PX: POLYPECTOMY: SHX149

## 2021-10-24 LAB — GLUCOSE, CAPILLARY: Glucose-Capillary: 179 mg/dL — ABNORMAL HIGH (ref 70–99)

## 2021-10-24 SURGERY — COLONOSCOPY WITH PROPOFOL
Anesthesia: General

## 2021-10-24 MED ORDER — LIDOCAINE HCL (CARDIAC) PF 100 MG/5ML IV SOSY
PREFILLED_SYRINGE | INTRAVENOUS | Status: DC | PRN
  Administered 2021-10-24: 50 mg via INTRAVENOUS

## 2021-10-24 MED ORDER — PROPOFOL 500 MG/50ML IV EMUL
INTRAVENOUS | Status: DC | PRN
  Administered 2021-10-24: 150 ug/kg/min via INTRAVENOUS

## 2021-10-24 MED ORDER — LACTATED RINGERS IV SOLN: INTRAVENOUS | Status: DC

## 2021-10-24 MED ORDER — PROPOFOL 10 MG/ML IV BOLUS
INTRAVENOUS | Status: DC | PRN
  Administered 2021-10-24: 100 mg via INTRAVENOUS

## 2021-10-24 NOTE — Interval H&P Note (Signed)
History and Physical Interval Note:  10/24/2021 8:37 AM  Anna Salinas  has presented today for surgery, with the diagnosis of cirrhosis,thrombocytopenia,IDA,unintentional wt loss, loose stools,colon cancer screening.  The various methods of treatment have been discussed with the patient and family. After consideration of risks, benefits and other options for treatment, the patient has consented to  Procedure(s) with comments: COLONOSCOPY WITH PROPOFOL (N/A) - 9:00 am ESOPHAGOGASTRODUODENOSCOPY (EGD) WITH PROPOFOL (N/A) as a surgical intervention.  The patient's history has been reviewed, patient examined, no change in status, stable for surgery.  I have reviewed the patient's chart and labs.  Questions were answered to the patient's satisfaction.     Eloise Harman

## 2021-10-24 NOTE — Op Note (Signed)
[facility] Patient Name: Anna Salinas Procedure Date: 10/24/2021 8:02 AM MRN: [id number] Date of Birth: 11/05/1970 Attending MD: Elon Alas. Edgar Frisk CSN: [id number] Age: 51 Admit Type: Outpatient Procedure:                Colonoscopy Indications:              Chronic diarrhea, Iron deficiency anemia Providers:                Elon Alas. Abbey Chatters, DO, Crystal Page, Ladoris Gene                            Technician, Technician, Aram Candela Referring MD:             Elon Alas. Abbey Chatters, DO Medicines:                See the Anesthesia note for documentation of the                            administered medications Complications:            No immediate complications. Estimated Blood Loss:     Estimated blood loss was minimal. Procedure:                Pre-Anesthesia Assessment:                           - The anesthesia plan was to use monitored                            anesthesia care (MAC).                           After obtaining informed consent, the colonoscope                            was passed under direct vision. Throughout the                            procedure, the patient's blood pressure, pulse, and                            oxygen saturations were monitored continuously. The                            PCF-HQ190L ([id number]) was introduced through the                            anus and advanced to the the cecum, identified by                            appendiceal orifice and ileocecal valve. The                            colonoscopy was performed without difficulty. The                            patient tolerated  the procedure well. The quality                            of the bowel preparation was evaluated using the                            BBPS [facility] Bowel Preparation Scale) with scores                            of: Right Colon = 2 (minor amount of residual                            staining, small fragments of stool and/or opaque                             liquid, but mucosa seen well), Transverse Colon = 2                            (minor amount of residual staining, small fragments                            of stool and/or opaque liquid, but mucosa seen                            well) and Left Colon = 2 (minor amount of residual                            staining, small fragments of stool and/or opaque                            liquid, but mucosa seen well). The total BBPS score                            equals 6. The quality of the bowel preparation was                            fair. Scope In: 8:50:17 AM Scope Out: 9:04:14 AM Scope Withdrawal Time: 0 hours 10 minutes 3 seconds  Total Procedure Duration: 0 hours 13 minutes 57 seconds  Findings:      The perianal and digital rectal examinations were normal.      Non-bleeding internal hemorrhoids were found during endoscopy.      A 4 mm polyp was found in the cecum. The polyp was sessile. The polyp       was removed with a cold snare. Resection and retrieval were complete.      A moderate amount of liquid stool was found in the entire colon, making       visualization difficult. Lavage of the area was performed using copious       amounts of sterile water, resulting in clearance with fair visualization. Impression:               - Preparation of the colon was fair.                           -  Non-bleeding internal hemorrhoids.                           - One 4 mm polyp in the cecum, removed with a cold                            snare. Resected and retrieved.                           - Stool in the entire examined colon. Moderate Sedation:      Per Anesthesia Care Recommendation:           - Patient has a contact number available for                            emergencies. The signs and symptoms of potential                            delayed complications were discussed with the                            patient. Return to normal activities tomorrow.                             Written discharge instructions were provided to the                            patient.                           - Resume previous diet.                           - Continue present medications.                           - Await pathology results.                           - Repeat colonoscopy in 5 years for surveillance                            and borderline prep                           - Return to GI clinic in 3 months. Procedure Code(s):        --- Professional ---                           [phone], Colonoscopy, flexible; with removal of                            tumor(s), polyp(s), or other lesion(s) by snare                            technique Diagnosis Code(s):        ---  Professional ---                           K64.8, Other hemorrhoids                           K63.5, Polyp of colon                           K52.9, Noninfective gastroenteritis and colitis,                            unspecified                           D50.9, Iron deficiency anemia, unspecified CPT copyright 2019 American [employer]. All rights reserved. The codes documented in this report are preliminary and upon coder review may  be revised to meet current compliance requirements. Elon Alas. Abbey Chatters, DO [place] Abbey Chatters, DO 10/24/2021 9:06:33 AM This report has been signed electronically. Number of Addenda: 0

## 2021-10-24 NOTE — Op Note (Signed)
[facility] Patient Name: Anna Salinas Procedure Date: 10/24/2021 8:03 AM MRN: [id number] Date of Birth: 1970/05/01 Attending MD: Elon Alas. Abbey Chatters DO CSN: [id number] Age: 51 Admit Type: Outpatient Procedure:                Upper GI endoscopy Indications:              Cirrhosis rule out esophageal varices Providers:                Elon Alas. Abbey Chatters, DO, Crystal Page, Ladoris Gene                            Technician, Technician, Aram Candela Referring MD:             Elon Alas. Abbey Chatters, DO Medicines:                See the Anesthesia note for documentation of the                            administered medications Complications:            No immediate complications. Estimated Blood Loss:     Estimated blood loss was minimal. Procedure:                Pre-Anesthesia Assessment:                           - The anesthesia plan was to use monitored                            anesthesia care (MAC).                           After obtaining informed consent, the endoscope was                            passed under direct vision. Throughout the                            procedure, the patient's blood pressure, pulse, and                            oxygen saturations were monitored continuously. The                            [id number]-H190 ([id number]) scope was introduced through the                            mouth, and advanced to the second part of duodenum.                            The upper GI endoscopy was accomplished without                            difficulty. The patient tolerated the procedure  well. Scope In: 8:43:44 AM Scope Out: 8:46:25 AM Total Procedure Duration: 0 hours 2 minutes 41 seconds  Findings:      Two columns of grade I, small (< 5 mm) varices with no bleeding and no       stigmata of recent bleeding were found in the lower third of the       esophagus,. No red wale signs were present.      Mild portal hypertensive gastropathy  was found in the entire examined       stomach.      Patchy mild inflammation characterized by erythema was found in the       entire examined stomach. Biopsies were taken with a cold forceps for       Helicobacter pylori testing.      The duodenal bulb, first portion of the duodenum and second portion of       the duodenum were normal. Impression:               - Grade I and small (< 5 mm) esophageal varices                            with no bleeding and no stigmata of recent bleeding.                           - Portal hypertensive gastropathy.                           - Gastritis. Biopsied.                           - Normal duodenal bulb, first portion of the                            duodenum and second portion of the duodenum. Moderate Sedation:      Per Anesthesia Care Recommendation:           - Patient has a contact number available for                            emergencies. The signs and symptoms of potential                            delayed complications were discussed with the                            patient. Return to normal activities tomorrow.                            Written discharge instructions were provided to the                            patient.                           - Resume previous diet.                           - Continue present  medications.                           - Await pathology results.                           - Repeat upper endoscopy in 2 years for                            surveillance.                           - Return to GI clinic in 3 months.                           - Consider NSBB for primary ppx for varices Procedure Code(s):        --- Professional ---                           [phone], Esophagogastroduodenoscopy, flexible,                            transoral; with biopsy, single or multiple Diagnosis Code(s):        --- Professional ---                           K74.60, Unspecified cirrhosis of liver                            I85.10, Secondary esophageal varices without                            bleeding                           K76.6, Portal hypertension                           K31.89, Other diseases of stomach and duodenum                           K29.70, Gastritis, unspecified, without bleeding CPT copyright 2019 American Medical Association. All rights reserved. The codes documented in this report are preliminary and upon coder review may  be revised to meet current compliance requirements. Elon Alas. Abbey Chatters, DO [name], DO 10/24/2021 8:49:26 AM This report has been signed electronically. Number of Addenda: 0

## 2021-10-24 NOTE — Anesthesia Postprocedure Evaluation (Signed)
Anesthesia Post Note  Patient: Anna Salinas  Procedure(s) Performed: COLONOSCOPY WITH PROPOFOL ESOPHAGOGASTRODUODENOSCOPY (EGD) WITH PROPOFOL BIOPSY POLYPECTOMY INTESTINAL  Patient location during evaluation: Phase II Anesthesia Type: General Level of consciousness: awake and alert and oriented Pain management: pain level controlled Vital Signs Assessment: post-procedure vital signs reviewed and stable Respiratory status: spontaneous breathing, nonlabored ventilation and respiratory function stable Cardiovascular status: blood pressure returned to baseline and stable Postop Assessment: no apparent nausea or vomiting Anesthetic complications: no   No notable events documented.   Last Vitals:  Vitals:   10/24/21 0908  BP: (!) 97/50  Pulse: 76  Resp: 16  Temp: 36.8 C  SpO2: 100%    Last Pain:  Vitals:   10/24/21 0908  TempSrc: Oral  PainSc: 0-No pain                 Nezzie Manera C Ch[place]

## 2021-10-24 NOTE — Transfer of Care (Signed)
Immediate Anesthesia Transfer of Care Note  Patient: [name]  Procedure(s) Performed: COLONOSCOPY WITH PROPOFOL ESOPHAGOGASTRODUODENOSCOPY (EGD) WITH PROPOFOL BIOPSY POLYPECTOMY INTESTINAL  Patient Location: Short Stay  Anesthesia Type:General  Level of Consciousness: drowsy  Airway & Oxygen Therapy: Patient Spontanous Breathing  Post-op Assessment: Report given to RN and Post -op Vital signs reviewed and stable  Post vital signs: Reviewed and stable  Last Vitals:  Vitals Value Taken Time  BP 97/50 10/24/21 0908  Temp 36.8 C 10/24/21 0908  Pulse 76 10/24/21 0908  Resp 16 10/24/21 0908  SpO2 100 % 10/24/21 0908    Last Pain:  Vitals:   10/24/21 0908  TempSrc: Oral  PainSc: 0-No pain         Complications: No notable events documented.

## 2021-10-24 NOTE — Anesthesia Preprocedure Evaluation (Signed)
Anesthesia Evaluation  Patient identified by MRN, date of birth, ID band Patient awake    Reviewed: Allergy & Precautions, NPO status , Patient's Chart, lab work & pertinent test results  Airway Mallampati: II  TM Distance: >3 FB Neck ROM: Full    Dental  (+) Dental Advisory Given, Missing, Chipped, Loose,    Pulmonary neg pulmonary ROS,    Pulmonary exam normal breath sounds clear to auscultation       Cardiovascular Exercise Tolerance: Good hypertension, Pt. on medications + Valvular Problems/Murmurs (AVR) AS  Rhythm:Regular Rate:Normal + Systolic murmurs    Neuro/Psych negative neurological ROS  negative psych ROS   GI/Hepatic negative GI ROS, (+) Cirrhosis   ascites    ,   Endo/Other  diabetes, Well Controlled, Type 2, Oral Hypoglycemic Agents, Insulin Dependent  Renal/GU negative Renal ROS  negative genitourinary   Musculoskeletal negative musculoskeletal ROS (+)   Abdominal   Peds negative pediatric ROS (+)  Hematology  (+) Blood dyscrasia, anemia ,   Anesthesia Other Findings   Reproductive/Obstetrics negative OB ROS                             Anesthesia Physical Anesthesia Plan  ASA: 3  Anesthesia Plan: General   Post-op Pain Management: Minimal or no pain anticipated   Induction: Intravenous  PONV Risk Score and Plan: Propofol infusion  Airway Management Planned: Nasal Cannula and Natural Airway  Additional Equipment:   Intra-op Plan:   Post-operative Plan:   Informed Consent: I have reviewed the patients History and Physical, chart, labs and discussed the procedure including the risks, benefits and alternatives for the proposed anesthesia with the patient or authorized representative who has indicated his/her understanding and acceptance.     Dental advisory given  Plan Discussed with: CRNA and Surgeon  Anesthesia Plan Comments:         Anesthesia  Quick Evaluation

## 2021-10-24 NOTE — Discharge Instructions (Addendum)
EGD Discharge instructions Please read the instructions outlined below and refer to this sheet in the next few weeks. These discharge instructions provide you with general information on caring for yourself after you leave the hospital. Your doctor may also give you specific instructions. While your treatment has been planned according to the most current medical practices available, unavoidable complications occasionally occur. If you have any problems or questions after discharge, please call your doctor. ACTIVITY You may resume your regular activity but move at a slower pace for the next 24 hours.  Take frequent rest periods for the next 24 hours.  Walking will help expel (get rid of) the air and reduce the bloated feeling in your abdomen.  No driving for 24 hours (because of the anesthesia (medicine) used during the test).  You may shower.  Do not sign any important legal documents or operate any machinery for 24 hours (because of the anesthesia used during the test).  NUTRITION Drink plenty of fluids.  You may resume your normal diet.  Begin with a light meal and progress to your normal diet.  Avoid alcoholic beverages for 24 hours or as instructed by your caregiver.  MEDICATIONS You may resume your normal medications unless your caregiver tells you otherwise.  WHAT YOU CAN EXPECT TODAY You may experience abdominal discomfort such as a feeling of fullness or "gas" pains.  FOLLOW-UP Your doctor will discuss the results of your test with you.  SEEK IMMEDIATE MEDICAL ATTENTION IF ANY OF THE FOLLOWING OCCUR: Excessive nausea (feeling sick to your stomach) and/or vomiting.  Severe abdominal pain and distention (swelling).  Trouble swallowing.  Temperature over 101 F (37.8 C).  Rectal bleeding or vomiting of blood.     Colonoscopy Discharge Instructions  Read the instructions outlined below and refer to this sheet in the next few weeks. These discharge instructions provide you with  general information on caring for yourself after you leave the hospital. Your doctor may also give you specific instructions. While your treatment has been planned according to the most current medical practices available, unavoidable complications occasionally occur.   ACTIVITY You may resume your regular activity, but move at a slower pace for the next 24 hours.  Take frequent rest periods for the next 24 hours.  Walking will help get rid of the air and reduce the bloated feeling in your belly (abdomen).  No driving for 24 hours (because of the medicine (anesthesia) used during the test).   Do not sign any important legal documents or operate any machinery for 24 hours (because of the anesthesia used during the test).  NUTRITION Drink plenty of fluids.  You may resume your normal diet as instructed by your doctor.  Begin with a light meal and progress to your normal diet. Heavy or fried foods are harder to digest and may make you feel sick to your stomach (nauseated).  Avoid alcoholic beverages for 24 hours or as instructed.  MEDICATIONS You may resume your normal medications unless your doctor tells you otherwise.  WHAT YOU CAN EXPECT TODAY Some feelings of bloating in the abdomen.  Passage of more gas than usual.  Spotting of blood in your stool or on the toilet paper.  IF YOU HAD POLYPS REMOVED DURING THE COLONOSCOPY: No aspirin products for 7 days or as instructed.  No alcohol for 7 days or as instructed.  Eat a soft diet for the next 24 hours.  FINDING OUT THE RESULTS OF YOUR TEST Not all test results are  available during your visit. If your test results are not back during the visit, make an appointment with your caregiver to find out the results. Do not assume everything is normal if you have not heard from your caregiver or the medical facility. It is important for you to follow up on all of your test results.  SEEK IMMEDIATE MEDICAL ATTENTION IF: You have more than a spotting of  blood in your stool.  Your belly is swollen (abdominal distention).  You are nauseated or vomiting.  You have a temperature over 101.  You have abdominal pain or discomfort that is severe or gets worse throughout the day.   Your EGD revealed mild amount inflammation in your stomach.  I took biopsies of this to rule out infection with a bacteria called H. pylori.  Await pathology results, my office will contact you.  You do have 2 columns of small esophageal varices.  We may consider treating these with medications.  Discuss further at follow-up visit.  Your colonoscopy revealed 1 polyp(s) which I removed successfully. Await pathology results, my office will contact you. I recommend repeating colonoscopy in 5 years for surveillance purposes.   I also took samples of your colon given your chronically loose stools.  Follow-up with GI in 2 to 3 months. Office will notify you.  I hope you have a great rest of your week!  Elon Alas. Abbey Chatters, D.O. Gastroenterology and Hepatology [facility]

## 2021-10-25 LAB — SURGICAL PATHOLOGY

## 2021-10-30 ENCOUNTER — Encounter (HOSPITAL_COMMUNITY): Payer: Self-pay | Admitting: Internal Medicine

## 2021-11-19 LAB — HEMOGLOBIN A1C: Hemoglobin A1C: 10

## 2021-11-19 LAB — LIPID PANEL
LDL Cholesterol: 59
Triglycerides: 138 (ref 40–160)

## 2021-12-09 NOTE — Progress Notes (Signed)
[place], [place] [address]   CLINIC:  Medical Oncology/Hematology  PCP:  Practice, [facility] [address] [place] [address] [phone]   HISTORY OF PRESENT ILLNESS: [name] (51 year old female) follows at our clinic for thrombocytopenia and iron deficiency anemia.  She was last seen by Tarri Abernethy PA-C on 09/07/2021.    In the interim since her last visit, she had EGD on 10/24/2021 which showed small esophageal varices, portal hypertensive gastropathy, and gastritis.  Colonoscopy showed non-bleeding internal hemorrhoids and polyp x 1.  She felt about the same after her IV iron in September/October 2023.  She continues to take Coumadin (history of mechanical aortic valve replacement).  She reports occasional gum bleeding when brushing her teeth.  She denies any other abnormal bleeding - no epistaxis, hematuria, hematochezia, melena.  She reports easy bruising but denies any large spontaneous bruises.  She continues to have scattered petechiae and brownish hyperpigmentation of her lower extremities which is worse after she has spent her 8-hour shift at work on her feet.  She denies any fever, chills, or night sweats.  She has not noticed any new lumps or bumps. She reports stable fatigue.  She is taking iron tablet daily.   Although she lost about 35 pounds in the past year, her weight is stable since her last visit 3 months ago.  She does continue to have early satiety but tries to maintain steady caloric intake.  She reports 65% energy with 90% appetite.     REVIEW OF SYSTEMS:      Review of Systems  Constitutional:  Positive for fatigue. Negative for appetite change, chills, diaphoresis, fever and unexpected weight change.       Early satiety  HENT:   Negative for lump/mass and nosebleeds.   Eyes:  Negative for eye problems.  Respiratory:  Negative for cough, hemoptysis and shortness of breath.   Cardiovascular:   Negative for chest pain, leg swelling and palpitations.  Gastrointestinal:  Negative for abdominal pain, blood in stool, constipation, diarrhea, nausea and vomiting.  Genitourinary:  Positive for frequency. Negative for hematuria.   Skin: Negative.   Neurological:  Positive for numbness. Negative for dizziness, headaches and light-headedness.  Hematological:  Bruises/bleeds easily.  Psychiatric/Behavioral:  Positive for sleep disturbance. Negative for depression.       PAST MEDICAL/SURGICAL HISTORY:  Past Medical History:  Diagnosis Date   Aortic stenosis due to bicuspid aortic valve    Blood transfusion without reported diagnosis    Essential hypertension, benign    Iron deficiency 09/07/2021   Palpitations    S/P laparoscopic hysterectomy 11/26/2017   Type 2 diabetes mellitus ([place])    Past Surgical History:  Procedure Laterality Date   ABDOMINAL HYSTERECTOMY     AORTIC VALVE REPLACEMENT N/A 02/14/2014   Procedure: AORTIC VALVE REPLACEMENT (AVR);  Surgeon: Gaye Pollack, MD;  Location: [facility];  Service: Open Heart Surgery;  Laterality: N/A;   BIOPSY  10/24/2021   Procedure: BIOPSY;  Surgeon: Eloise Harman, DO;  [facility];  Service: Endoscopy;;   CARDIAC CATHETERIZATION     CESAREAN SECTION  01/15/2007   CHEST TUBE INSERTION Right 03/27/2014   Procedure: CHEST TUBE INSERTION;  Surgeon: Grace Isaac, MD;  Location: [facility];  Service: Thoracic;  Laterality: Right;   COLONOSCOPY WITH PROPOFOL N/A 10/24/2021   Procedure: COLONOSCOPY WITH PROPOFOL;  Surgeon: Eloise Harman, DO;  [facility];  Service: Endoscopy;  Laterality: N/A;  9:00 am   CYSTOSCOPY N/A 11/26/2017   Procedure: CYSTOSCOPY;  Surgeon: Sherlyn Hay, DO;  Location: [facility];  Service: Gynecology;  Laterality: N/A;   ESOPHAGOGASTRODUODENOSCOPY (EGD) WITH PROPOFOL N/A 10/24/2021   Procedure: ESOPHAGOGASTRODUODENOSCOPY (EGD) WITH PROPOFOL;  Surgeon: Eloise Harman, DO;  [facility];  Service: Endoscopy;  Laterality: N/A;   INTRAOPERATIVE TRANSESOPHAGEAL ECHOCARDIOGRAM N/A 02/14/2014   Procedure: INTRAOPERATIVE TRANSESOPHAGEAL ECHOCARDIOGRAM;  Surgeon: Gaye Pollack, MD;  Location: [facility];  Service: Open Heart Surgery;  Laterality: N/A;   LEFT AND RIGHT HEART CATHETERIZATION WITH CORONARY ANGIOGRAM N/A 01/25/2014   Procedure: LEFT AND RIGHT HEART CATHETERIZATION WITH CORONARY ANGIOGRAM;  Surgeon: Peter M Martinique, MD;  Location: [facility];  Service: Cardiovascular;  Laterality: N/A;   PERICARDIAL FLUID DRAINAGE N/A 03/27/2014   Procedure: DRAINAGE OF PERICARDIAL FLUID;  Surgeon: Grace Isaac, MD;  Location: [facility];  Service: Thoracic;  Laterality: N/A;   PLEURAL EFFUSION DRAINAGE Right 03/27/2014   Procedure: DRAINAGE OF PLEURAL EFFUSION;  Surgeon: Grace Isaac, MD;  Location: [facility];  Service: Thoracic;  Laterality: Right;  Drainage of right pleural effusion   POLYPECTOMY  10/24/2021   Procedure: POLYPECTOMY INTESTINAL;  Surgeon: Eloise Harman, DO;  [facility];  Service: Endoscopy;;   SUBXYPHOID PERICARDIAL WINDOW N/A 03/27/2014   Procedure: SUBXYPHOID PERICARDIAL WINDOW;  Surgeon: Grace Isaac, MD;  Location: [facility];  Service: Thoracic;  Laterality: N/A;   TOTAL LAPAROSCOPIC HYSTERECTOMY WITH SALPINGECTOMY Bilateral 11/26/2017   Procedure: TOTAL LAPAROSCOPIC HYSTERECTOMY WITH BILATERAL SALPINGECTOMY LEFT OOPHERECTOMY;  Surgeon: Sherlyn Hay, DO;  Location: [facility];  Service: Gynecology;  Laterality: Bilateral;   TUBAL LIGATION       SOCIAL HISTORY:  Social History   Socioeconomic History   Marital status: Married    Spouse name: Not on file   Number of children: 3   Years of education: Not on file   Highest education level: Not on file  Occupational History   Occupation: Surveyor, quantity: [employer]  Tobacco Use   Smoking status: Never   Smokeless tobacco: Never  Vaping Use   Vaping Use: Never  used  Substance and Sexual Activity   Alcohol use: No    Alcohol/week: 0.0 standard drinks of alcohol   Drug use: No   Sexual activity: Not on file  Other Topics Concern   Not on file  Social History Narrative   Not on file   Social Determinants of Health   Financial Resource Strain: Not on file  Food Insecurity: Not on file  Transportation Needs: Not on file  Physical Activity: Not on file  Stress: Not on file  Social Connections: Not on file  Intimate Partner Violence: Not on file    FAMILY HISTORY:  Family History  Problem Relation Age of Onset   Diabetes Mother    Heart disease Mother    Brain cancer Brother    Crohn's disease Brother    Colon cancer Maternal Grandmother    Cancer - Colon Maternal Grandmother        90s    CURRENT MEDICATIONS:  Outpatient Encounter Medications as of 12/10/2021  Medication Sig   dicyclomine (BENTYL) 10 MG capsule Take 1 capsule (10 mg total) by mouth up to 3 (three) times daily before meals as needed for loose stools or abdominal cramping. Hold in the setting of constipation.   metFORMIN (GLUCOPHAGE-XR) 500 MG 24  hr tablet Take 1 tablet (500 mg total) by mouth 2 (two) times daily.   NOVOLOG MIX 70/30 FLEXPEN (70-30) 100 UNIT/ML FlexPen Inject 30-60 Units into the skin See admin instructions. 60 units in the morning. 30 units at night.   rosuvastatin (CRESTOR) 10 MG tablet Take 1 tablet (10 mg total) by mouth daily. (Patient taking differently: Take 5 mg by mouth at bedtime.)   warfarin (COUMADIN) 5 MG tablet Take 0.5-1 tablets (2.5-5 mg total) by mouth See admin instructions. Take 2.5 mg by mouth daily except take 5 mg by mouth on Monday, Wednesday and Friday (Patient taking differently: Take 2.5-5 mg by mouth See admin instructions. Take 5 mg  on Tuesday and Thursday, all the other days take 2.5 mg  in the evening)   No facility-administered encounter medications on file as of 12/10/2021.    ALLERGIES:  Allergies  Allergen  Reactions   Glipizide Nausea And Vomiting and Other (See Comments)    GI upset   Metoprolol Other (See Comments)    "Made me sick"   Shellfish Allergy Swelling    Throat and eyes were swollen.  Had difficulty breathing.  Was hospitalized.      PHYSICAL EXAM:    ECOG PERFORMANCE STATUS: 1 - Symptomatic but completely ambulatory  There were no vitals filed for this visit.  There were no vitals filed for this visit.  Physical Exam Constitutional:      Appearance: Normal appearance. She is obese.  HENT:     Head: Normocephalic and atraumatic.     Mouth/Throat:     Mouth: Mucous membranes are moist.  Eyes:     Extraocular Movements: Extraocular movements intact.     Pupils: Pupils are equal, round, and reactive to light.  Cardiovascular:     Rate and Rhythm: Normal rate and regular rhythm.     Pulses: Normal pulses.     Comments: Mechanical click auscultated. Pulmonary:     Effort: Pulmonary effort is normal.     Breath sounds: Normal breath sounds.  Abdominal:     General: Bowel sounds are normal.     Palpations: Abdomen is soft. There is no hepatomegaly or splenomegaly.     Tenderness: There is no abdominal tenderness.  Musculoskeletal:        General: No swelling.     Right lower leg: No edema.     Left lower leg: No edema.  Lymphadenopathy:     Head:     Right side of head: No submental, submandibular, tonsillar, preauricular, posterior auricular or occipital adenopathy.     Left side of head: No submental, submandibular, tonsillar, preauricular, posterior auricular or occipital adenopathy.     Cervical: No cervical adenopathy.     Right cervical: No superficial, deep or posterior cervical adenopathy.    Left cervical: No superficial, deep or posterior cervical adenopathy.     Upper Body:     Right upper body: No supraclavicular, axillary, pectoral or epitrochlear adenopathy.     Left upper body: No supraclavicular, axillary, pectoral or epitrochlear adenopathy.   Skin:    General: Skin is warm and dry.     Comments: A few scattered nonblanchable petechiae and patches of brownish pigmentation extending from below the knee to above the ankle, nontender, nonpruritic.  Neurological:     General: No focal deficit present.     Mental Status: She is alert and oriented to person, place, and time.  Psychiatric:        Mood and  Affect: Mood normal.        Behavior: Behavior normal.      LABORATORY DATA:  I have reviewed the labs as listed.  CBC    Component Value Date/Time   WBC 4.3 09/27/2021 0904   RBC 4.67 09/27/2021 0904   HGB 13.1 09/27/2021 0904   HGB 11.9 02/26/2018 1544   HCT 40.1 09/27/2021 0904   HCT 37.1 02/26/2018 1544   PLT 61 (L) 09/27/2021 0904   PLT 151 02/26/2018 1544   MCV 85.9 09/27/2021 0904   MCV 78 (L) 02/26/2018 1544   MCH 28.1 09/27/2021 0904   MCHC 32.7 09/27/2021 0904   RDW 13.6 09/27/2021 0904   RDW 16.0 (H) 02/26/2018 1544   LYMPHSABS 976 09/27/2021 0904   LYMPHSABS 1.8 02/26/2018 1544   MONOABS 0.4 08/17/2021 1014   EOSABS 69 09/27/2021 0904   EOSABS 0.2 02/26/2018 1544   BASOSABS 22 09/27/2021 0904   BASOSABS 0.1 02/26/2018 1544      Latest Ref Rng & Units 09/27/2021    9:04 AM 03/24/2019   10:10 AM 06/01/2018   11:12 AM  CMP  Glucose 65 - 139 mg/dL 223  250  308   BUN 7 - 25 mg/dL '12  10  13   '$ Creatinine 0.50 - 0.99 mg/dL 0.70  0.70  0.73   Sodium 135 - 146 mmol/L 140  136  135   Potassium 3.5 - 5.3 mmol/L 3.5  3.7  4.0   Chloride 98 - 110 mmol/L 102  102  100   CO2 20 - 32 mmol/L '29  26  21   '$ Calcium 8.6 - 10.2 mg/dL 9.2  8.9  9.5   Total Protein 6.1 - 8.1 g/dL 6.5  6.9  7.0   Total Bilirubin 0.2 - 1.2 mg/dL 0.9  1.0  0.6   Alkaline Phos 38 - 126 U/L  60  66   AST 10 - 35 U/L 26  26  33   ALT 6 - 29 U/L '28  24  24     '$ DIAGNOSTIC IMAGING:  I have independently reviewed the relevant imaging and discussed with the patient.  ASSESSMENT & PLAN: 1.  Intermittent thrombocytopenia: - Mild to  moderate thrombocytopenia since 2016, when she had her AVR with mechanical valve, with worsening platelet counts over the past 2+ years. - Nutritional deficiency work-up, hepatitis panel was negative.  ANA was positive with speckled pattern with elevation of 2320.  She does not have any clinical signs or symptoms of lupus. - No improvement in platelets after trial of Decadron 40 mg x 4 days  - CT abdomen/pelvis (09/01/2021): Decompensated liver cirrhosis with trace ascites and with splenomegaly (15 cm) - Following [facility] for liver cirrhosis in September 2023 - She is on Coumadin and denies any easy bruising or bleeding.   - She does exhibit petechial rash and hyperpigmentation of lower extremities (as of visit on 10/06/2020), suspected to be Schamberg's disease rather than due to thrombocytopenia, further discussed below  - Most recent labs (12/10/2021): Platelets unable to be calculated [facility]) - Patient reports 35 pound unintentional weight loss over the past year.  Reports early satiety, but states that she is still has the same caloric intake overall.  Weight today is stable compared to her visit 3 months ago. - PLAN: Repeat CBC and RTC in 3 months. - If platelet count < 50 K, would consider holding Coumadin (Hx mechanical AVR).  - Patient is aware of alarm  symptoms that would prompt immediate medical attention.  2.  Iron deficiency without anemia - She has a history of microcytic anemia, which has resolved.  Hemoglobin on 08/09/2021 is normal at 14.3/MCV 85.1 - She was previously noted (09/22/2020) to have mild iron deficiency with ferritin 25 and iron saturation 9% -- She had blood transfusions in 2019 due to anemia from menorrhagia.  She is now s/p hysterectomy (April 2019). - EGD (10/24/2021): Small esophageal varices, portal hypertensive gastropathy, and gastritis. - Colonoscopy (10/24/2021): Non-bleeding internal hemorrhoids and polyp x 1. - She takes  daily iron supplement, but required IV Venofer 300 mg x 3 in September/October 2023 - Most recent labs (12/10/2021): Hgb 13.7/MCV 86.9, ferritin 178, iron saturation 23% - No rectal bleeding or melena.   - Reports baseline fatigue - PLAN: No indication for IV iron at present. - Continue oral iron supplement.  We will recheck iron panel and CBC in 3 months.  3.  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  No peripheral edema.  Nonpruritic/nonpainful. - Lesions appear similar in characteristic to the pattern of Schamberg's disease (a.k.a. " progressive pigmented purpura") - Little suspicion that petechial rash is related to thrombocytopenia, as patient had the skin lesions even when her platelets were only mildly low - PLAN: Recommended to patient that she wear compression stockings during her work shifts to see if this alleviates her lesions   - Patient declined dermatology referral - Patient informed that she should notify us immediately if she has any worsening petechiae or if petechiae spread to other areas of the body    PLAN SUMMARY: >> Labs in 3 months (CBC/D, CMP, ferritin, iron/TIBC) >> PHONE visit 1 week after labs   All questions were answered. The patient knows to call the clinic with any problems, questions or concerns.  Medical decision making: Moderate  Time spent on visit: I spent 20 minutes counseling the patient face to face. The total time spent in the appointment was 30 minutes and more than 50% was on counseling.   Harriett Rush, PA-C  12/10/2021 2:43 PM

## 2021-12-10 ENCOUNTER — Inpatient Hospital Stay: Payer: BC Managed Care – PPO

## 2021-12-10 ENCOUNTER — Inpatient Hospital Stay: Payer: BC Managed Care – PPO | Attending: Physician Assistant | Admitting: Physician Assistant

## 2021-12-10 ENCOUNTER — Other Ambulatory Visit: Payer: Self-pay

## 2021-12-10 VITALS — BP 123/78 | HR 70 | Temp 96.8°F | Resp 18 | Ht 67.52 in | Wt 208.3 lb

## 2021-12-10 DIAGNOSIS — I8500 Esophageal varices without bleeding: Secondary | ICD-10-CM | POA: Diagnosis not present

## 2021-12-10 DIAGNOSIS — D696 Thrombocytopenia, unspecified: Secondary | ICD-10-CM

## 2021-12-10 DIAGNOSIS — K3189 Other diseases of stomach and duodenum: Secondary | ICD-10-CM | POA: Diagnosis not present

## 2021-12-10 DIAGNOSIS — R188 Other ascites: Secondary | ICD-10-CM

## 2021-12-10 DIAGNOSIS — R21 Rash and other nonspecific skin eruption: Secondary | ICD-10-CM | POA: Diagnosis not present

## 2021-12-10 DIAGNOSIS — R6881 Early satiety: Secondary | ICD-10-CM | POA: Diagnosis not present

## 2021-12-10 DIAGNOSIS — D649 Anemia, unspecified: Secondary | ICD-10-CM

## 2021-12-10 DIAGNOSIS — K2970 Gastritis, unspecified, without bleeding: Secondary | ICD-10-CM | POA: Diagnosis not present

## 2021-12-10 DIAGNOSIS — K766 Portal hypertension: Secondary | ICD-10-CM | POA: Diagnosis not present

## 2021-12-10 DIAGNOSIS — E611 Iron deficiency: Secondary | ICD-10-CM

## 2021-12-10 DIAGNOSIS — R634 Abnormal weight loss: Secondary | ICD-10-CM | POA: Diagnosis not present

## 2021-12-10 LAB — CBC WITH DIFFERENTIAL/PLATELET
Abs Immature Granulocytes: 0.02 10*3/uL (ref 0.00–0.07)
Basophils Absolute: 0 10*3/uL (ref 0.0–0.1)
Basophils Relative: 1 %
Eosinophils Absolute: 0.1 10*3/uL (ref 0.0–0.5)
Eosinophils Relative: 2 %
HCT: 41.1 % (ref 36.0–46.0)
Hemoglobin: 13.7 g/dL (ref 12.0–15.0)
Immature Granulocytes: 0 %
Lymphocytes Relative: 23 %
Lymphs Abs: 1.2 10*3/uL (ref 0.7–4.0)
MCH: 29 pg (ref 26.0–34.0)
MCHC: 33.3 g/dL (ref 30.0–36.0)
MCV: 86.9 fL (ref 80.0–100.0)
Monocytes Absolute: 0.3 10*3/uL (ref 0.1–1.0)
Monocytes Relative: 6 %
Neutro Abs: 3.6 10*3/uL (ref 1.7–7.7)
Neutrophils Relative %: 68 %
RBC: 4.73 MIL/uL (ref 3.87–5.11)
RDW: 14.6 % (ref 11.5–15.5)
WBC: 5.3 10*3/uL (ref 4.0–10.5)
nRBC: 0 % (ref 0.0–0.2)

## 2021-12-10 LAB — COMPREHENSIVE METABOLIC PANEL
ALT: 45 U/L — ABNORMAL HIGH (ref 0–44)
AST: 38 U/L (ref 15–41)
Albumin: 4.1 g/dL (ref 3.5–5.0)
Alkaline Phosphatase: 86 U/L (ref 38–126)
Anion gap: 10 (ref 5–15)
BUN: 11 mg/dL (ref 6–20)
CO2: 26 mmol/L (ref 22–32)
Calcium: 9.1 mg/dL (ref 8.9–10.3)
Chloride: 99 mmol/L (ref 98–111)
Creatinine, Ser: 0.62 mg/dL (ref 0.44–1.00)
GFR, Estimated: >60 mL/min (ref >60)
Glucose, Bld: 291 mg/dL — ABNORMAL HIGH (ref 70–99)
Potassium: 3.8 mmol/L (ref 3.5–5.1)
Sodium: 135 mmol/L (ref 135–145)
Total Bilirubin: 1 mg/dL (ref 0.3–1.2)
Total Protein: 6.9 g/dL (ref 6.5–8.1)

## 2021-12-10 LAB — IRON AND TIBC
Iron: 78 ug/dL (ref 28–170)
Saturation Ratios: 23 % (ref 10.4–31.8)
TIBC: 335 ug/dL (ref 250–450)
UIBC: 257 ug/dL

## 2021-12-10 LAB — FERRITIN: Ferritin: 178 ng/mL (ref 11–307)

## 2021-12-10 NOTE — Patient Instructions (Signed)
[facility] **   You were seen today by Tarri Abernethy PA-C for your low platelets and iron deficiency.    LOW PLATELETS Your low platelets are related to your cirrhosis and enlarged spleen. Overall your platelets are stable. Continue to follow-up with your GI provider for your cirrhosis. Seek immediate medical attention if you notice any severe spontaneous bruising or abnormal bleeding.  LOW IRON Your iron levels have improved after IV iron. You do not need any IV iron at this time. Continue to take iron tablet daily.  FOLLOW-UP APPOINTMENT: Labs in 3 months with phone visit 1 week after  ** Thank you for trusting me with your healthcare!  I strive to provide all of my patients with quality care at each visit.  If you receive a survey for this visit, I would be so grateful to you for taking the time to provide feedback.  Thank you in advance!  ~ [place]                   Dr. Derek Jack   &   Tarri Abernethy, PA-C   - - - - - - - - - - - - - - - - - -    Thank you for choosing [facility] to provide your oncology and hematology care.  To afford each patient quality time with our provider, please arrive at least 15 minutes before your scheduled appointment time.   If you have a lab appointment with the [facility] please come in thru the Main Entrance and check in at the main information desk.  You need to re-schedule your appointment should you arrive 10 or more minutes late.  We strive to give you quality time with our providers, and arriving late affects you and other patients whose appointments are after yours.  Also, if you no show three or more times for appointments you may be dismissed from the clinic at the providers discretion.     Again, thank you for choosing [facility].  Our hope is that these requests will decrease the amount of  time that you wait before being seen by our physicians.       _____________________________________________________________  Should you have questions after your visit to [facility], please contact our office at [phone] and follow the prompts.  Our office hours are 8:00 a.m. and 4:30 p.m. Monday - Friday.  Please note that voicemails left after 4:00 p.m. may not be returned until the following business day.  We are closed weekends and major holidays.  You do have access to a nurse 24-7, just call the main number to the clinic [phone] and do not press any options, hold on the line and a nurse will answer the phone.    For prescription refill requests, have your pharmacy contact our office and allow 72 hours.

## 2021-12-23 ENCOUNTER — Other Ambulatory Visit: Payer: Self-pay | Admitting: Gastroenterology

## 2021-12-23 DIAGNOSIS — R195 Other fecal abnormalities: Secondary | ICD-10-CM

## 2021-12-25 ENCOUNTER — Telehealth: Payer: Self-pay | Admitting: Internal Medicine

## 2021-12-25 NOTE — Telephone Encounter (Signed)
Patient had called 12/11 to schedule an appt but in looking at the recall Dr. Abbey Chatters wanted her to follow up with Anna Salinas.  I left patient a message asking if she wanted to wait and schedule the appt when Cyril Mourning returns in mid-February or go ahead and schedule for January.  Asked that she call the office back.

## 2022-03-03 ENCOUNTER — Encounter (INDEPENDENT_AMBULATORY_CARE_PROVIDER_SITE_OTHER): Payer: Self-pay

## 2022-03-04 ENCOUNTER — Ambulatory Visit: Payer: BC Managed Care – PPO | Admitting: Gastroenterology

## 2022-03-08 ENCOUNTER — Inpatient Hospital Stay: Payer: BC Managed Care – PPO | Attending: Hematology

## 2022-03-08 DIAGNOSIS — K7460 Unspecified cirrhosis of liver: Secondary | ICD-10-CM

## 2022-03-08 DIAGNOSIS — D509 Iron deficiency anemia, unspecified: Secondary | ICD-10-CM | POA: Diagnosis present

## 2022-03-08 DIAGNOSIS — D6959 Other secondary thrombocytopenia: Secondary | ICD-10-CM | POA: Diagnosis present

## 2022-03-08 DIAGNOSIS — D696 Thrombocytopenia, unspecified: Secondary | ICD-10-CM

## 2022-03-08 DIAGNOSIS — E611 Iron deficiency: Secondary | ICD-10-CM

## 2022-03-08 LAB — CBC WITH DIFFERENTIAL/PLATELET
Abs Immature Granulocytes: 0.01 10*3/uL (ref 0.00–0.07)
Basophils Absolute: 0 10*3/uL (ref 0.0–0.1)
Basophils Relative: 1 %
Eosinophils Absolute: 0.1 10*3/uL (ref 0.0–0.5)
Eosinophils Relative: 2 %
HCT: 41.4 % (ref 36.0–46.0)
Hemoglobin: 14.4 g/dL (ref 12.0–15.0)
Immature Granulocytes: 0 %
Lymphocytes Relative: 19 %
Lymphs Abs: 1 10*3/uL (ref 0.7–4.0)
MCH: 31.2 pg (ref 26.0–34.0)
MCHC: 34.8 g/dL (ref 30.0–36.0)
MCV: 89.6 fL (ref 80.0–100.0)
Monocytes Absolute: 0.3 10*3/uL (ref 0.1–1.0)
Monocytes Relative: 5 %
Neutro Abs: 3.9 10*3/uL (ref 1.7–7.7)
Neutrophils Relative %: 73 %
Platelets: 80 10*3/uL — ABNORMAL LOW (ref 150–400)
RBC: 4.62 MIL/uL (ref 3.87–5.11)
RDW: 13.9 % (ref 11.5–15.5)
WBC: 5.3 10*3/uL (ref 4.0–10.5)
nRBC: 0 % (ref 0.0–0.2)

## 2022-03-08 LAB — COMPREHENSIVE METABOLIC PANEL
ALT: 35 U/L (ref 0–44)
AST: 35 U/L (ref 15–41)
Albumin: 4 g/dL (ref 3.5–5.0)
Alkaline Phosphatase: 70 U/L (ref 38–126)
Anion gap: 6 (ref 5–15)
BUN: 11 mg/dL (ref 6–20)
CO2: 25 mmol/L (ref 22–32)
Calcium: 8.7 mg/dL — ABNORMAL LOW (ref 8.9–10.3)
Chloride: 102 mmol/L (ref 98–111)
Creatinine, Ser: 0.68 mg/dL (ref 0.44–1.00)
GFR, Estimated: >60 mL/min (ref >60)
Glucose, Bld: 241 mg/dL — ABNORMAL HIGH (ref 70–99)
Potassium: 3.6 mmol/L (ref 3.5–5.1)
Sodium: 133 mmol/L — ABNORMAL LOW (ref 135–145)
Total Bilirubin: 1.3 mg/dL — ABNORMAL HIGH (ref 0.3–1.2)
Total Protein: 6.9 g/dL (ref 6.5–8.1)
eGFR: 97.1

## 2022-03-08 LAB — IRON AND TIBC
Iron: 74 ug/dL (ref 28–170)
Saturation Ratios: 22 % (ref 10.4–31.8)
TIBC: 343 ug/dL (ref 250–450)
UIBC: 269 ug/dL

## 2022-03-08 LAB — TSH: TSH: 1.44 (ref 0.41–5.90)

## 2022-03-08 LAB — BASIC METABOLIC PANEL
BUN: 9 (ref 4–21)
Creatinine: 0.8 (ref 0.5–1.1)
Glucose: 294

## 2022-03-08 LAB — FERRITIN: Ferritin: 126 ng/mL (ref 11–307)

## 2022-03-08 LAB — HEMOGLOBIN A1C: Hemoglobin A1C: 9.4

## 2022-03-14 NOTE — Progress Notes (Signed)
VIRTUAL VISIT via [place]   I connected with Anna Salinas  on 03/15/22 at  2:32 PM by telephone and verified that I am speaking with the correct person using two identifiers.  Location: Patient: Home Provider: [facility]   I discussed the limitations, risks, security and privacy concerns of performing an evaluation and management service by telephone and the availability of in person appointments. I also discussed with the patient that there may be a patient responsible charge related to this service. The patient expressed understanding and agreed to proceed.  REASON FOR VISIT: Thrombocytopenia (cirrhosis/splenomegaly) + iron deficiency anemia  CURRENT THERAPY: Intermittent IV iron  INTERVAL HISTORY:  Anna Salinas is contacted today for follow-up of thrombocytopenia and iron deficiency anemia.  She was last seen by Tarri Abernethy PA-C on 12/10/2021.     At today's visit, she reports feeling fair.  She denies any surgeries, hospitalizations, or major changes in her baseline health status since her last visit, but has been struggling with grief after her mother passed away on Christmas Day 2023.   Her overall energy remains low at about 50%.  She continues to take Coumadin (history of mechanical aortic valve replacement).  She reports occasional gum bleeding when brushing her teeth.  She denies any other abnormal bleeding - no epistaxis, hematuria, hematochezia, melena.  She reports easy bruising but denies any large spontaneous bruises.  She continues to have scattered petechiae and brownish hyperpigmentation of her lower extremities which is worse after she has spent her 8-hour shift at work on her feet.  She denies any fever, chills, or night sweats.  She has not noticed any new lumps or bumps. She is taking iron tablet daily She reports 50% energy and 50% appetite. She is maintaining a stable weight.  REVIEW OF SYSTEMS:    Review of Systems  Constitutional:  Positive for malaise/fatigue. Negative for chills, diaphoresis, fever and weight loss.  Respiratory:  Negative for cough and shortness of breath.   Cardiovascular:  Negative for chest pain and palpitations.  Gastrointestinal:  Negative for abdominal pain, blood in stool, melena, nausea and vomiting.  Genitourinary:  Positive for frequency (Especially at night).  Neurological:  Positive for tingling. Negative for dizziness and headaches.  Psychiatric/Behavioral:  The patient has insomnia.      PHYSICAL EXAM: (per limitations of virtual telephone visit)  The patient is alert and oriented x 3, exhibiting adequate mentation, good mood, and ability to speak in full sentences and execute sound judgement.  ASSESSMENT & PLAN:  1.  Thrombocytopenia, secondary to splenic sequestration (liver cirrhosis/splenomegaly) - Mild to moderate thrombocytopenia since 2016, when she had her AVR with mechanical valve, with worsening platelet counts over the past 2+ years. - Nutritional deficiency work-up, hepatitis panel was negative.  ANA was positive with speckled pattern with elevation of 2320.  She does not have any clinical signs or symptoms of lupus. - No improvement in platelets after trial of Decadron 40 mg x 4 days  - CT abdomen/pelvis (09/01/2021): Decompensated liver cirrhosis with trace ascites and with splenomegaly (15 cm) - Following with [facility] for liver cirrhosis in September 2023 - She is on Coumadin and denies any easy bruising or bleeding.   - She does exhibit petechial rash and hyperpigmentation of lower extremities (as of visit on 10/06/2020), suspected to be Schamberg's disease rather than due to thrombocytopenia, further discussed below  - Most recent labs (03/08/2022): Platelets 80,  at baseline.  CBC otherwise normal. - PLAN: Repeat CBC and RTC in 6 months  - If platelet count < 50 K, would consider holding Coumadin (Hx  mechanical AVR).  - Patient is aware of alarm symptoms that would prompt immediate medical attention.   2.  Iron deficiency without anemia - She has a history of microcytic anemia, which has resolved.  Hemoglobin on 08/09/2021 is normal at 14.3/MCV 85.1 - She was previously noted (09/22/2020) to have mild iron deficiency with ferritin 25 and iron saturation 9% -- She had blood transfusions in 2019 due to anemia from menorrhagia.  She is now s/p hysterectomy (April 2019). - EGD (10/24/2021): Small esophageal varices, portal hypertensive gastropathy, and gastritis. - Colonoscopy (10/24/2021): Non-bleeding internal hemorrhoids and polyp x 1. - She takes daily iron supplement, but required IV Venofer 300 mg x 3 in September/October 2023 - Most recent labs (03/08/2022): Hgb 14.4/MCV 89.6, ferritin 126, iron saturation 22% - No rectal bleeding or melena. - Reports baseline fatigue - PLAN: No indication for IV iron at present. - Continue oral iron supplement.  We will recheck iron panel and CBC in 6 months    3.  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  No peripheral edema.  Nonpruritic/nonpainful. - Lesions appear similar in characteristic to the pattern of Schamberg's disease (a.k.a. " progressive pigmented purpura") - Little suspicion that petechial rash is related to thrombocytopenia, as patient had the skin lesions even when her platelets were > 100 - PLAN: Recommended to patient that she wear compression stockings during her work shifts to see if this alleviates her lesions   - Patient declined dermatology referral - Patient informed that she should notify us immediately if she has any worsening petechiae or if  petechiae spread to other areas of the body    PLAN SUMMARY: >> Labs in 6 months = CBC/D, CMP, ferritin, iron/TIBC >> PHONE visit in 6 months (1 to 2 days after labs)     I discussed the assessment and treatment plan with the patient. The patient was provided an opportunity to ask questions and all were answered. The patient agreed with the plan and demonstrated an understanding of the instructions.   The patient was advised to call back or seek an in-person evaluation if the symptoms worsen or if the condition fails to improve as anticipated.  I provided 18 minutes of non-face-to-face time during this encounter.  Harriett Rush, PA-C 03/15/22 4:50 PM

## 2022-03-15 ENCOUNTER — Inpatient Hospital Stay: Payer: BC Managed Care – PPO | Attending: Physician Assistant | Admitting: Physician Assistant

## 2022-03-15 DIAGNOSIS — D696 Thrombocytopenia, unspecified: Secondary | ICD-10-CM

## 2022-03-15 DIAGNOSIS — E611 Iron deficiency: Secondary | ICD-10-CM | POA: Diagnosis not present

## 2022-03-18 NOTE — Progress Notes (Unsigned)
Referring Provider: [facility] Primary Care Physician:  [facility] Primary GI Physician: Dr. Abbey Chatters  No chief complaint on file.   HPI:   Anna Salinas is a 52 y.o. female with history of aortic stenosis s/p mechanical valve replacement on Warfarin, HTN, diabetes, IDA, thrombocytopenia, cirrhosis, IBS, presenting today for follow-up. ***  Last  seen in our office 09/27/21 for recently diagnosed cirrhosis as well as weight loss, loose stools with abdominal cramping.  She had a recent CT A/P with contrast in August showing cirrhosis, splenomegaly, trace perihepatic and pelvic free fluid.  Clinically, she remains well compensated. Suspected etiology secondary to fatty liver but planned to complete additional workup.  Regarding history of IDA, she denied overt GI bleeding or NSAID use.  She had no significant upper GI symptoms.  Lower GI symptoms consisted of chronic postprandial bowel movement with associated abdominal cramping that improves with a bowel movement with stools ranging from soft to mushy.  She was reporting unintentional weight loss over the last several months.  Noted she had lost about 40 pounds over the last 15 months with weight stable for the last month. Recent CT A/P with contrast without findings to explain weight loss. No prior EGD or colonoscopy. Planned for labs to evaluate cirrhosis, EGD, colonoscopy, start dicyclomine.   Lab results:  ANA positive with 1:320 titer with nuclear dense fine speckled pattern. ASMA, AMA, immunoglobulins wnl. No alpha 1 antitrypsin deficiency. Ceruloplasmin normal. Hep A, B, C all negative without immunity to Hep A/B. Celiac screen negative. TSH normal. LFTs normal. Platelets low at 61. INR elevated at 2.9 in the setting of warfarin. Discussed with Dr. Abbey Chatters. If her liver enzymes were to become elevated at some point in the future, we may need to consider liver biopsy. Recommended Hep A/B vaccine.   Procedures  10/24/21: EGD: Grade 1 esophageal varices, portal hypertensive gastropathy, gastritis biopsied.  Recommended EGD in 2 years and consider NSBB for primary ppx for varices.  Pathology with minimal chronic gastritis.   Colonoscopy: Non bleeding internal hemorrhoids, one 4 mm polyps in the cecum removed, fair prep.   Pathology with sessile serrated adenoma. Random colon biopsies benign.   Repeat in 5 years.    Today:   Cirrhosis:  Labs: Reent labs 03/08/22 with LFTs normal, bilirubin 1.3, albumin 4.0, sodium 133, Cr 0.68. No INR MELD: MELD is inaccurate in the setting of warfarin. Last complete labs in September 2023 with MELD 3.0 17, but only because INR is 2.9. All other values were within normal limits at that time.  [place]: Overdue. Last imaging CT A/P with contrast August 2023.  Hep A/B vaccination:  EGD: Grade 1 varices October 2023. Surveillance in 2025.  NSBB:  Ascites/peripheral edema:  Diuretics:  Encephalopathy:   IDA:  Hgb 14.4 03/08/32. Ferritin 126, iron 74, saturation 22% 03/08/22  Loose stools/IBS:   Weight loss:   Past Medical History:  Diagnosis Date   Aortic stenosis due to bicuspid aortic valve    Blood transfusion without reported diagnosis    Essential hypertension, benign    Iron deficiency 09/07/2021   Palpitations    S/P laparoscopic hysterectomy 11/26/2017   Type 2 diabetes mellitus ([place])     Past Surgical History:  Procedure Laterality Date   ABDOMINAL HYSTERECTOMY     AORTIC VALVE REPLACEMENT N/A 02/14/2014   Procedure: AORTIC VALVE REPLACEMENT (AVR);  Surgeon: Gaye Pollack, MD;  Location: [place];  Service: Open Heart Surgery;  Laterality: N/A;  BIOPSY  10/24/2021   Procedure: BIOPSY;  Surgeon: Eloise Harman, DO;  [facility];  Service: Endoscopy;;   CARDIAC CATHETERIZATION     CESAREAN SECTION  01/15/2007   CHEST TUBE INSERTION Right 03/27/2014   Procedure: CHEST TUBE INSERTION;  Surgeon: Grace Isaac, MD;  Location: [place];   Service: Thoracic;  Laterality: Right;   COLONOSCOPY WITH PROPOFOL N/A 10/24/2021   Procedure: COLONOSCOPY WITH PROPOFOL;  Surgeon: Eloise Harman, DO;  [facility];  Service: Endoscopy;  Laterality: N/A;  9:00 am   CYSTOSCOPY N/A 11/26/2017   Procedure: CYSTOSCOPY;  Surgeon: Sherlyn Hay, DO;  Location: [facility];  Service: Gynecology;  Laterality: N/A;   ESOPHAGOGASTRODUODENOSCOPY (EGD) WITH PROPOFOL N/A 10/24/2021   Procedure: ESOPHAGOGASTRODUODENOSCOPY (EGD) WITH PROPOFOL;  Surgeon: Eloise Harman, DO;  [facility];  Service: Endoscopy;  Laterality: N/A;   INTRAOPERATIVE TRANSESOPHAGEAL ECHOCARDIOGRAM N/A 02/14/2014   Procedure: INTRAOPERATIVE TRANSESOPHAGEAL ECHOCARDIOGRAM;  Surgeon: Gaye Pollack, MD;  Location: [place] [facility];  Service: Open Heart Surgery;  Laterality: N/A;   LEFT AND RIGHT HEART CATHETERIZATION WITH CORONARY ANGIOGRAM N/A 01/25/2014   Procedure: LEFT AND RIGHT HEART CATHETERIZATION WITH CORONARY ANGIOGRAM;  Surgeon: Peter M Martinique, MD;  Location: [facility];  Service: Cardiovascular;  Laterality: N/A;   PERICARDIAL FLUID DRAINAGE N/A 03/27/2014   Procedure: DRAINAGE OF PERICARDIAL FLUID;  Surgeon: Grace Isaac, MD;  Location: [place];  Service: Thoracic;  Laterality: N/A;   PLEURAL EFFUSION DRAINAGE Right 03/27/2014   Procedure: DRAINAGE OF PLEURAL EFFUSION;  Surgeon: Grace Isaac, MD;  Location: [place];  Service: Thoracic;  Laterality: Right;  Drainage of right pleural effusion   POLYPECTOMY  10/24/2021   Procedure: POLYPECTOMY INTESTINAL;  Surgeon: Eloise Harman, DO;  [facility];  Service: Endoscopy;;   SUBXYPHOID PERICARDIAL WINDOW N/A 03/27/2014   Procedure: SUBXYPHOID PERICARDIAL WINDOW;  Surgeon: Grace Isaac, MD;  Location: [place];  Service: Thoracic;  Laterality: N/A;   TOTAL LAPAROSCOPIC HYSTERECTOMY WITH SALPINGECTOMY Bilateral 11/26/2017   Procedure: TOTAL LAPAROSCOPIC HYSTERECTOMY WITH BILATERAL  SALPINGECTOMY LEFT OOPHERECTOMY;  Surgeon: Sherlyn Hay, DO;  Location: [place] [facility];  Service: Gynecology;  Laterality: Bilateral;   TUBAL LIGATION      Current Outpatient Medications  Medication Sig Dispense Refill   dicyclomine (BENTYL) 10 MG capsule TAKE 1 CAPSULE BY MOUTH 3 TIMES A DAY BEFORE MEALS AS NEEDED FOR LOOSE STOOLS/ABDOMINAL CRAMPING 270 capsule 3   metFORMIN (GLUCOPHAGE-XR) 500 MG 24 hr tablet Take 1 tablet (500 mg total) by mouth 2 (two) times daily. 180 tablet 1   NOVOLOG MIX 70/30 FLEXPEN (70-30) 100 UNIT/ML FlexPen Inject 30-60 Units into the skin See admin instructions. 60 units in the morning. 30 units at night.     rosuvastatin (CRESTOR) 10 MG tablet Take 1 tablet (10 mg total) by mouth daily. (Patient taking differently: Take 5 mg by mouth at bedtime.) 90 tablet 3   warfarin (COUMADIN) 5 MG tablet Take 0.5-1 tablets (2.5-5 mg total) by mouth See admin instructions. Take 2.5 mg by mouth daily except take 5 mg by mouth on Monday, Wednesday and Friday (Patient taking differently: Take 2.5-5 mg by mouth See admin instructions. Take 5 mg  on Tuesday and Thursday, all the other days take 2.5 mg  in the evening) 30 tablet 5   No current facility-administered medications for this visit.    Allergies as of 03/20/2022 - Review Complete 03/15/2022  Allergen Reaction Noted  Glipizide Nausea And Vomiting and Other (See Comments) 10/27/2013   Metoprolol Other (See Comments) 03/26/2014   Shellfish allergy Swelling 01/21/2014    Family History  Problem Relation Age of Onset   Diabetes Mother    Heart disease Mother    Brain cancer Brother    Crohn's disease Brother    Colon cancer Maternal Grandmother    Cancer - Colon Maternal Grandmother        90s    Social History   Socioeconomic History   Marital status: Married    Spouse name: Not on file   Number of children: 3   Years of education: Not on file   Highest education level: Not on file  Occupational History    Occupation: Surveyor, quantity: [employer]  Tobacco Use   Smoking status: Never   Smokeless tobacco: Never  Vaping Use   Vaping Use: Never used  Substance and Sexual Activity   Alcohol use: No    Alcohol/week: 0.0 standard drinks of alcohol   Drug use: No   Sexual activity: Not on file  Other Topics Concern   Not on file  Social History Narrative   Not on file   Social Determinants of Health   Financial Resource Strain: Not on file  Food Insecurity: Not on file  Transportation Needs: Not on file  Physical Activity: Not on file  Stress: Not on file  Social Connections: Not on file    Review of Systems: Gen: Denies fever, chills, anorexia. Denies fatigue, weakness, weight loss.  CV: Denies chest pain, palpitations, syncope, peripheral edema, and claudication. Resp: Denies dyspnea at rest, cough, wheezing, coughing up blood, and pleurisy. GI: Denies vomiting blood, jaundice, and fecal incontinence.   Denies dysphagia or odynophagia. Derm: Denies rash, itching, dry skin Psych: Denies depression, anxiety, memory loss, confusion. No homicidal or suicidal ideation.  Heme: Denies bruising, bleeding, and enlarged lymph nodes.  Physical Exam: LMP 04/14/2017  General:   Alert and oriented. No distress noted. Pleasant and cooperative.  Head:  Normocephalic and atraumatic. Eyes:  Conjuctiva clear without scleral icterus. Heart:  S1, S2 present without murmurs appreciated. Lungs:  Clear to auscultation bilaterally. No wheezes, rales, or rhonchi. No distress.  Abdomen:  +BS, soft, non-tender and non-distended. No rebound or guarding. No HSM or masses noted. Msk:  Symmetrical without gross deformities. Normal posture. Extremities:  Without edema. Neurologic:  Alert and  oriented x4 Psych:  Normal mood and affect.    Assessment:     Plan:  ***   Aliene Altes, PA-C [facility] 03/20/2022

## 2022-03-20 ENCOUNTER — Encounter: Payer: Self-pay | Admitting: Gastroenterology

## 2022-03-20 ENCOUNTER — Ambulatory Visit: Payer: BC Managed Care – PPO | Admitting: Gastroenterology

## 2022-03-20 VITALS — BP 135/80 | HR 70 | Temp 96.9°F | Ht 69.0 in | Wt 219.0 lb

## 2022-03-20 DIAGNOSIS — D509 Iron deficiency anemia, unspecified: Secondary | ICD-10-CM

## 2022-03-20 DIAGNOSIS — K580 Irritable bowel syndrome with diarrhea: Secondary | ICD-10-CM

## 2022-03-20 DIAGNOSIS — I8500 Esophageal varices without bleeding: Secondary | ICD-10-CM | POA: Diagnosis not present

## 2022-03-20 DIAGNOSIS — K7460 Unspecified cirrhosis of liver: Secondary | ICD-10-CM

## 2022-03-20 MED ORDER — CARVEDILOL 3.125 MG PO TABS: 3.1250 mg | ORAL_TABLET | Freq: Two times a day (BID) | ORAL | 3 refills | Status: DC

## 2022-03-20 NOTE — Patient Instructions (Addendum)
We will arrange you to have an ultrasound of your abdomen at [facility].  Please have hepatitis A/B vaccine completed.  We are going to start you on carvedilol 3.125 mg twice daily to help prevent bleeding of your esophageal varices. Please keep a log of your blood pressure and heart rate for the next couple of weeks and bring this by.  We will need to titrate this medication in order to achieve a resting heart rate of 55-60.  This will depend on your blood pressure as well.  If your heart rate is consistently below 55 or your blood pressure top number drops below 100, please let me know.  If you develop nausea with carvedilol, please let me know.  Continue to follow-up with hematology for monitoring of your hemoglobin.  Continue dicyclomine for loose stools and abdominal cramping.  We will follow-up with you in 3 months.  Do not hesitate to call sooner if you have questions or concerns.  It was good to see you again today!

## 2022-03-21 ENCOUNTER — Encounter: Payer: Self-pay | Admitting: *Deleted

## 2022-03-21 ENCOUNTER — Telehealth: Payer: Self-pay | Admitting: *Deleted

## 2022-03-21 NOTE — Telephone Encounter (Signed)
LMOVM with pt identifying herself of appointment for [place] scheduled for 04/01/22, arrive at 10:15 am, NPO after midnight. Any questions to give me a call back.

## 2022-03-26 ENCOUNTER — Other Ambulatory Visit: Payer: Self-pay | Admitting: Gastroenterology

## 2022-03-26 DIAGNOSIS — K7460 Unspecified cirrhosis of liver: Secondary | ICD-10-CM

## 2022-03-26 DIAGNOSIS — I8500 Esophageal varices without bleeding: Secondary | ICD-10-CM

## 2022-04-01 ENCOUNTER — Ambulatory Visit (HOSPITAL_COMMUNITY)
Admission: RE | Admit: 2022-04-01 | Discharge: 2022-04-01 | Disposition: A | Discharge status: Home / Self Care | Payer: BC Managed Care – PPO | Source: Ambulatory Visit | Attending: Gastroenterology | Admitting: Gastroenterology

## 2022-04-01 DIAGNOSIS — K7460 Unspecified cirrhosis of liver: Secondary | ICD-10-CM | POA: Insufficient documentation

## 2022-05-30 ENCOUNTER — Encounter: Payer: Self-pay | Admitting: Gastroenterology

## 2022-06-17 LAB — LIPID PANEL
LDL Cholesterol: 57
Triglycerides: 110 (ref 40–160)

## 2022-06-17 LAB — HEMOGLOBIN A1C: Hemoglobin A1C: 11.1

## 2022-09-10 ENCOUNTER — Ambulatory Visit: Payer: BC Managed Care – PPO | Admitting: Nurse Practitioner

## 2022-09-16 NOTE — Progress Notes (Signed)
Referring Provider: [facility] Primary Care Physician:  [facility] Primary GI Physician: Dr. Marletta Lor  Chief Complaint  Patient presents with   Follow-up    Follow up. No problems     HPI:   Anna Salinas is a 52 y.o. female with history of aortic stenosis s/p mechanical valve replacement on Warfarin, HTN, diabetes, IDA, thrombocytopenia, cirrhosis suspected to be secondary to MASLD, IBS, adenomatous colon polyp due for surveillance in 2028, presenting today for follow-up.   Serologic workup of cirrhosis: ANA positive with 1:320 titer with nuclear dense fine speckled pattern. ASMA, AMA, immunoglobulins wnl. No alpha 1 antitrypsin deficiency. Ceruloplasmin normal. Hep A, B, C all negative without immunity to Hep A/B. Celiac screen negative. TSH normal. LFTs normal. Platelets low at 61. INR elevated at 2.9 in the setting of warfarin. Discussed with Dr. Marletta Lor. If her liver enzymes were to become elevated at some point in the future, we may need to consider liver biopsy. Recommended Hep A/B vaccine.   Last seen in our office 03/20/2022.  She reported some intermittent swelling in her legs and abdomen, but she had no appreciable edema or ascites on exam.  No other symptoms of decompensated liver disease.  She was overdue for ultrasound and this was scheduled.  She also needed to start nonselective beta-blocker for history of varices and was prescribed carvedilol 3.125 mg twice daily.  IBS well-controlled with dicyclomine.  No overt GI bleeding.  Regarding her history of IDA, her most recent hemoglobin was stable and she last received IV iron in October 2023.  She was not taking oral iron.  Recommended continuing to monitor hemoglobin.  Could consider givens capsule if hemoglobin/iron begins to decline again.  Suspected portal hypertensive gastropathy and prior dysfunctional uterine bleeding likely etiology of IDA.  Today:  Last labs 03/08/2022.  She is scheduled to  have routine labs with hematology 9/6.  Patient reports PCP will check INR next week.  MELD 3.0: MELD is inaccurate in the setting of warfarin. Last complete labs in September 2023 with MELD 3.0 17, but only because INR is 2.9. All other values were within normal limits at that time. MELD 3.0 would be 21 with most recent labs in February but INR in September.  [place]: 04/01/2022-no focal liver lesion. + Splenomegaly AFP: 4.8  on 09/27/21 Hep A/B vaccination: Started series this with [facility] earlier this year.  EGD:  Grade 1 varices October 2023. Surveillance in 2025.  BB: Stopped taking carvedilol 3.125 mg BID this due to lightheadedness.  Ascites/peripheral edema: None Diuretics: None Paracentesis: Never History of SBP: No Encephalopathy:  No   IDA: No brbpr or melena.   IBS-D: Bowels moving well. Taking dicyclomine about 3 times a week. No brbpr or melena.   Notes some aching in bilateral ribcage area. On her feet for about 9 hours a day. Started a couple weeks ago. No nausea or vomiting. No affect by eating or bowel movements.  Has been having heartburn recently. Total of 3 occurrence in the last 2 weeks. Taking tums. No dysphagia.   Past Medical History:  Diagnosis Date   Aortic stenosis due to bicuspid aortic valve    Blood transfusion without reported diagnosis    Essential hypertension, benign    Iron deficiency 09/07/2021   Palpitations    S/P laparoscopic hysterectomy 11/26/2017   Type 2 diabetes mellitus (HCC)     Past Surgical History:  Procedure Laterality Date   ABDOMINAL HYSTERECTOMY  AORTIC VALVE REPLACEMENT N/A 02/14/2014   Procedure: AORTIC VALVE REPLACEMENT (AVR);  Surgeon: Alleen Borne, MD;  Location: [facility] OR;  Service: Open Heart Surgery;  Laterality: N/A;   BIOPSY  10/24/2021   Procedure: BIOPSY;  Surgeon: Lanelle Bal, DO;  Location: [facility] SUITE;  Service: Endoscopy;;   CARDIAC CATHETERIZATION     CESAREAN SECTION  01/15/2007   CHEST TUBE  INSERTION Right 03/27/2014   Procedure: CHEST TUBE INSERTION;  Surgeon: Delight Ovens, MD;  Location: [facility] OR;  Service: Thoracic;  Laterality: Right;   COLONOSCOPY WITH PROPOFOL N/A 10/24/2021   Surgeon: Lanelle Bal, DO; Non bleeding internal hemorrhoids, one 4 mm sessile serrated adenoma. Random colon biopsies benign. Repeat in 5 years.   CYSTOSCOPY N/A 11/26/2017   Procedure: CYSTOSCOPY;  Surgeon: Edwinna Areola, DO;  Location: [facility] ORS;  Service: Gynecology;  Laterality: N/A;   ESOPHAGOGASTRODUODENOSCOPY (EGD) WITH PROPOFOL N/A 10/24/2021   Surgeon: Lanelle Bal, DO;   Grade 1 esophageal varices, portal hypertensive gastropathy, gastritis biopsied. Pathology with minimal chronic gastritis   INTRAOPERATIVE TRANSESOPHAGEAL ECHOCARDIOGRAM N/A 02/14/2014   Procedure: INTRAOPERATIVE TRANSESOPHAGEAL ECHOCARDIOGRAM;  Surgeon: Alleen Borne, MD;  Location[facility] OR;  Service: Open Heart Surgery;  Laterality: N/A;   LEFT AND RIGHT HEART CATHETERIZATION WITH CORONARY ANGIOGRAM N/A 01/25/2014   Procedure: LEFT AND RIGHT HEART CATHETERIZATION WITH CORONARY ANGIOGRAM;  Surgeon: Peter M Swaziland, MD;  Location: [facility] CATH LAB;  Service: Cardiovascular;  Laterality: N/A;   PERICARDIAL FLUID DRAINAGE N/A 03/27/2014   Procedure: DRAINAGE OF PERICARDIAL FLUID;  Surgeon: Delight Ovens, MD;  Location: [facility] OR;  Service: Thoracic;  Laterality: N/A;   PLEURAL EFFUSION DRAINAGE Right 03/27/2014   Procedure: DRAINAGE OF PLEURAL EFFUSION;  Surgeon: Delight Ovens, MD;  Location: [facility] OR;  Service: Thoracic;  Laterality: Right;  Drainage of right pleural effusion   POLYPECTOMY  10/24/2021   Procedure: POLYPECTOMY INTESTINAL;  Surgeon: Lanelle Bal, DO;  Location: [facility];  Service: Endoscopy;;   SUBXYPHOID PERICARDIAL WINDOW N/A 03/27/2014   Procedure: SUBXYPHOID PERICARDIAL WINDOW;  Surgeon: Delight Ovens, MD;  Location: [facility] OR;  Service: Thoracic;  Laterality: N/A;   TOTAL LAPAROSCOPIC  HYSTERECTOMY WITH SALPINGECTOMY Bilateral 11/26/2017   Procedure: TOTAL LAPAROSCOPIC HYSTERECTOMY WITH BILATERAL SALPINGECTOMY LEFT OOPHERECTOMY;  Surgeon: Edwinna Areola, DO;  Location: [facility] ORS;  Service: Gynecology;  Laterality: Bilateral;   TUBAL LIGATION      Current Outpatient Medications  Medication Sig Dispense Refill   dicyclomine (BENTYL) 10 MG capsule TAKE 1 CAPSULE BY MOUTH 3 TIMES A DAY BEFORE MEALS AS NEEDED FOR LOOSE STOOLS/ABDOMINAL CRAMPING 270 capsule 3   metFORMIN (GLUCOPHAGE-XR) 500 MG 24 hr tablet Take 1 tablet (500 mg total) by mouth 2 (two) times daily. 180 tablet 1   nadolol (CORGARD) 20 MG tablet Take 1 tablet (20 mg total) by mouth daily. 30 tablet 3   NOVOLOG MIX 70/30 FLEXPEN (70-30) 100 UNIT/ML FlexPen Inject 30-60 Units into the skin See admin instructions. 60 units in the morning. 30 units at night.     rosuvastatin (CRESTOR) 10 MG tablet Take 1 tablet (10 mg total) by mouth daily. (Patient taking differently: Take 5 mg by mouth at bedtime.) 90 tablet 3   warfarin (COUMADIN) 5 MG tablet Take 0.5-1 tablets (2.5-5 mg total) by mouth See admin instructions. Take 2.5 mg by mouth daily except take 5 mg by mouth on Monday, Wednesday and Friday (Patient taking differently: Take 2.5-5 mg by mouth See  admin instructions. Take 5 mg  on Tuesday and Thursday, all the other days take 2.5 mg  in the evening) 30 tablet 5   No current facility-administered medications for this visit.    Allergies as of 09/18/2022 - Review Complete 09/18/2022  Allergen Reaction Noted   Glipizide Nausea And Vomiting and Other (See Comments) 10/27/2013   Metoprolol Other (See Comments) 03/26/2014   Shellfish allergy Swelling 01/21/2014    Family History  Problem Relation Age of Onset   Diabetes Mother    Heart disease Mother    Brain cancer Brother    Crohn's disease Brother    Colon cancer Maternal Grandmother    Cancer - Colon Maternal Grandmother        90s    Social History    Socioeconomic History   Marital status: Married    Spouse name: Not on file   Number of children: 3   Years of education: Not on file   Highest education level: Not on file  Occupational History   Occupation: Lobbyist: [employer]  Tobacco Use   Smoking status: Never   Smokeless tobacco: Never  Vaping Use   Vaping status: Never Used  Substance and Sexual Activity   Alcohol use: No    Alcohol/week: 0.0 standard drinks of alcohol   Drug use: No   Sexual activity: Not on file  Other Topics Concern   Not on file  Social History Narrative   Not on file   Social Determinants of Health   Financial Resource Strain: Not on file  Food Insecurity: Not on file  Transportation Needs: Not on file  Physical Activity: Not on file  Stress: Not on file  Social Connections: Not on file    Review of Systems: Gen: Denies fever, chills, cold or flulike symptoms, presyncope, syncope. CV: Denies chest pain, palpitations. Resp: Denies dyspnea, cough.  GI: See HPI  Heme: See HPI  Physical Exam: BP 138/77 (BP Location: Right Arm, Patient Position: Sitting, Cuff Size: Normal)   Pulse 78   Temp 97.8 F (36.6 C) (Temporal)   Ht 5\' 9"  (1.753 m)   Wt 222 lb (100.7 kg)   LMP 04/14/2017   SpO2 99%   BMI 32.78 kg/m  General:   Alert and oriented. No distress noted. Pleasant and cooperative.  Head:  Normocephalic and atraumatic. Eyes:  Conjuctiva clear without scleral icterus. Heart:  S1, S2 present without murmurs appreciated. Lungs:  Clear to auscultation bilaterally. No wheezes, rales, or rhonchi. No distress.  Abdomen:  +BS, soft, non-tender and non-distended. No rebound or guarding. No HSM or masses noted. Msk:  Symmetrical without gross deformities. Normal posture. Extremities:  Without edema. Neurologic:  Alert and  oriented x4 Psych:  Normal mood and affect.    Assessment:  52 y.o. female with history of aortic stenosis s/p mechanical valve replacement  on Warfarin, HTN, diabetes, IDA, thrombocytopenia, cirrhosis suspected to be secondary to MASLD, IBS, adenomatous colon polyp due for surveillance in 2028, presenting today for follow-up.   Cirrhosis:  Secondary to MASLD. Clinically, she is well compensated though cirrhosis is complicated by thrombocytopenia and esophageal varices.  MELD is an accurate in setting of warfarin though most recent labs in February with slightly low sodium of 133, bilirubin 1.3, creatinine, albumin within normal limits, platelets 80.  EGD up-to-date in October 2023 with grade 1 varices, due for surveillance in 2025.  Previously tried her on carvedilol 3.125 mg twice daily, but patient discontinued  this due to lightheadedness.  I will try her on nadolol 20 mg daily.  She is due for [facility] screening and routine labs.  She has a lab appointment on 9/6 with hematology and reports her INR will be checked by PCP next week.  I will add AFP to upcoming labs.  She has started hepatitis A/B vaccination series with PCP.  IBS-D:  Doing well with dicyclomine as needed.  Heartburn:  Fairly infrequent symptoms, likely related to diet.  Responds well to Tums.  Advised that she could try to take Tums or over-the-counter Pepcid prior to eating her known dietary triggers.  IDA:  Following with hematology and received iron infusions last year, last in October 2023. No oral iron at this time. Most recent labs February 2024 with Hgb 14.4, iron panel wnl. She denies overt GI bleeding. EGD and colonoscopy completed  10/24/21 with Grade 1 esophageal varices, portal hypertensive gastropathy, minimal chronic gastritis, non bleeding internal hemorrhoids, one 4 mm sessile serrated adenoma removed from the colon.    Oozing from portal hypertensive gastropathy may be contributing to IDA. Also previously with dysfunctional uterine bleeding s/p hysterectomy in 2019.    As Hgb and iron panel are wnl, will continue to monitor for now. Could consider givens  capsule if hemoglobin/iron begins to decline again.     Plan:  Ordering AFP to be completed with her scheduled labs on 9/6 that been ordered by hematology (CBC, CMP, iron panel). Patient will have INR checked next week with PCP.  Requested lab result to be faxed to [place]. RUQ ultrasound. Start nadolol 20 mg daily.  Requested patient to keep a log of blood pressure and heart rate daily x 1 week, then send results to the office via MyChart, telephone call, or bring them by.  She will let me know if she develops lightheadedness. Complete hepatitis A/B vaccine series with PCP. Continue dicyclomine as needed. Take Tums or over-the-counter Pepcid prior to consuming known dietary triggers of heartburn. Follow-up in 6 months or sooner if needed.   Ermalinda Memos, PA-C [facility] 09/18/2022

## 2022-09-18 ENCOUNTER — Encounter: Payer: Self-pay | Admitting: Gastroenterology

## 2022-09-18 ENCOUNTER — Encounter: Payer: Self-pay | Admitting: *Deleted

## 2022-09-18 ENCOUNTER — Ambulatory Visit (INDEPENDENT_AMBULATORY_CARE_PROVIDER_SITE_OTHER): Payer: BC Managed Care – PPO | Admitting: Gastroenterology

## 2022-09-18 VITALS — BP 138/77 | HR 78 | Temp 97.8°F | Ht 69.0 in | Wt 222.0 lb

## 2022-09-18 DIAGNOSIS — K7460 Unspecified cirrhosis of liver: Secondary | ICD-10-CM | POA: Diagnosis not present

## 2022-09-18 DIAGNOSIS — R12 Heartburn: Secondary | ICD-10-CM | POA: Diagnosis not present

## 2022-09-18 DIAGNOSIS — I8510 Secondary esophageal varices without bleeding: Secondary | ICD-10-CM | POA: Diagnosis not present

## 2022-09-18 DIAGNOSIS — K580 Irritable bowel syndrome with diarrhea: Secondary | ICD-10-CM | POA: Diagnosis not present

## 2022-09-18 DIAGNOSIS — I8500 Esophageal varices without bleeding: Secondary | ICD-10-CM

## 2022-09-18 MED ORDER — NADOLOL 20 MG PO TABS: 20.0000 mg | ORAL_TABLET | Freq: Every day | ORAL | 3 refills | Status: DC

## 2022-09-18 NOTE — Patient Instructions (Addendum)
Please have AFP completed with your routine blood work that you are having done on Friday.  When you have your INR checked next week with your primary care doctor, please have this result faxed back to our office.  I am going to start you on nadolol 20 mg daily due to your history of esophageal varices.  Please monitor your heart rate and blood pressure daily for 1 week after starting this medication and let me know what the results are.  You can call the office, bring by a paper with the recordings, or send me a MyChart message. If you have any trouble with lightheadedness or feel bad with this medication, please let me know.  We will also schedule you for an ultrasound of your liver.  Please follow-up with your primary doctor to see if you have completed your hepatitis A/B vaccination series.  For your heartburn related to certain dietary triggers, you can try taking Tums prior to eating your known triggers.  If this does not prevent symptoms, you could try over-the-counter Pepcid 20 mg before your dietary triggers.  Continue taking dicyclomine as needed for abdominal cramping or loose stool.  We will follow-up with you in the office in 6 months or sooner if needed.  It was great to see you again today!  Ermalinda Memos, PA-C [facility]

## 2022-09-20 ENCOUNTER — Ambulatory Visit (HOSPITAL_COMMUNITY)
Admission: RE | Admit: 2022-09-20 | Discharge: 2022-09-20 | Disposition: A | Discharge status: Home / Self Care | Payer: BC Managed Care – PPO | Source: Ambulatory Visit | Attending: Gastroenterology | Admitting: Gastroenterology

## 2022-09-20 ENCOUNTER — Encounter: Payer: Self-pay | Admitting: Gastroenterology

## 2022-09-20 ENCOUNTER — Inpatient Hospital Stay: Payer: BC Managed Care – PPO | Attending: Hematology

## 2022-09-20 DIAGNOSIS — D696 Thrombocytopenia, unspecified: Secondary | ICD-10-CM

## 2022-09-20 DIAGNOSIS — K7460 Unspecified cirrhosis of liver: Secondary | ICD-10-CM | POA: Diagnosis not present

## 2022-09-20 DIAGNOSIS — R161 Splenomegaly, not elsewhere classified: Secondary | ICD-10-CM | POA: Diagnosis not present

## 2022-09-20 DIAGNOSIS — D6959 Other secondary thrombocytopenia: Secondary | ICD-10-CM | POA: Diagnosis present

## 2022-09-20 DIAGNOSIS — E611 Iron deficiency: Secondary | ICD-10-CM

## 2022-09-20 LAB — COMPREHENSIVE METABOLIC PANEL
ALT: 40 U/L (ref 0–44)
AST: 35 U/L (ref 15–41)
Albumin: 3.7 g/dL (ref 3.5–5.0)
Alkaline Phosphatase: 78 U/L (ref 38–126)
Anion gap: 6 (ref 5–15)
BUN: 10 mg/dL (ref 6–20)
CO2: 26 mmol/L (ref 22–32)
Calcium: 8.2 mg/dL — ABNORMAL LOW (ref 8.9–10.3)
Chloride: 101 mmol/L (ref 98–111)
Creatinine, Ser: 0.63 mg/dL (ref 0.44–1.00)
GFR, Estimated: >60 mL/min (ref >60)
Glucose, Bld: 414 mg/dL — ABNORMAL HIGH (ref 70–99)
Potassium: 3.6 mmol/L (ref 3.5–5.1)
Sodium: 133 mmol/L — ABNORMAL LOW (ref 135–145)
Total Bilirubin: 1.6 mg/dL — ABNORMAL HIGH (ref 0.3–1.2)
Total Protein: 6.6 g/dL (ref 6.5–8.1)

## 2022-09-20 LAB — CBC WITH DIFFERENTIAL/PLATELET
Abs Immature Granulocytes: 0.01 10*3/uL (ref 0.00–0.07)
Basophils Absolute: 0 10*3/uL (ref 0.0–0.1)
Basophils Relative: 1 %
Eosinophils Absolute: 0.1 10*3/uL (ref 0.0–0.5)
Eosinophils Relative: 2 %
HCT: 39.3 % (ref 36.0–46.0)
Hemoglobin: 12.8 g/dL (ref 12.0–15.0)
Immature Granulocytes: 0 %
Lymphocytes Relative: 24 %
Lymphs Abs: 0.8 10*3/uL (ref 0.7–4.0)
MCH: 29 pg (ref 26.0–34.0)
MCHC: 32.6 g/dL (ref 30.0–36.0)
MCV: 88.9 fL (ref 80.0–100.0)
Monocytes Absolute: 0.2 10*3/uL (ref 0.1–1.0)
Monocytes Relative: 7 %
Neutro Abs: 2.3 10*3/uL (ref 1.7–7.7)
Neutrophils Relative %: 66 %
Platelets: 50 10*3/uL — ABNORMAL LOW (ref 150–400)
RBC: 4.42 MIL/uL (ref 3.87–5.11)
RDW: 14.1 % (ref 11.5–15.5)
WBC: 3.4 10*3/uL — ABNORMAL LOW (ref 4.0–10.5)
nRBC: 0 % (ref 0.0–0.2)

## 2022-09-20 LAB — FERRITIN: Ferritin: 79 ng/mL (ref 11–307)

## 2022-09-20 LAB — IRON AND TIBC
Iron: 57 ug/dL (ref 28–170)
Saturation Ratios: 17 % (ref 10.4–31.8)
TIBC: 337 ug/dL (ref 250–450)
UIBC: 280 ug/dL

## 2022-09-20 NOTE — Progress Notes (Unsigned)
VIRTUAL VISIT via TELEPHONE NOTE [facility]   I connected with Anna Salinas  on 09/23/22 at  10:39 AM by telephone and verified that I am speaking with the correct person using two identifiers.  Location: Patient: Home Provider: [facility]   I discussed the limitations, risks, security and privacy concerns of performing an evaluation and management service by telephone and the availability of in person appointments. I also discussed with the patient that there may be a patient responsible charge related to this service. The patient expressed understanding and agreed to proceed.  REASON FOR VISIT: Thrombocytopenia (cirrhosis/splenomegaly) + iron deficiency anemia  CURRENT THERAPY: Intermittent IV iron  INTERVAL HISTORY:  Anna Salinas is contacted today for follow-up of thrombocytopenia and iron deficiency anemia.  She was last evaluated via telemedicine visit by Rojelio Brenner PA-C on 03/15/2022.     At today's visit, she reports feeling fairly well.  She denies any surgeries, hospitalizations, or major changes in her baseline health status since her last visit.   Her overall energy remains low at about 50%.   She continues to take Coumadin (history of mechanical aortic valve replacement).   She reports occasional gum bleeding when brushing her teeth.  She denies any other abnormal bleeding - no epistaxis, hematuria, hematochezia, melena.  She reports easy bruising in her extremities, but denies any large spontaneous bruises.  She continues to have scattered petechiae and brownish hyperpigmentation of her lower extremities which is worse after she has spent her 8-hour shift at work on her feet.  She denies any fever, chills, or night sweats.  She has not noticed any new lumps or bumps.  She is taking iron tablet daily.  She reports 50% energy and 100% appetite. She is maintaining a stable weight.  REVIEW OF SYSTEMS:   Review of Systems   Constitutional:  Positive for malaise/fatigue. Negative for chills, diaphoresis, fever and weight loss.  Respiratory:  Negative for cough and shortness of breath.   Cardiovascular:  Positive for leg swelling. Negative for chest pain and palpitations.  Gastrointestinal:  Negative for abdominal pain, blood in stool, melena, nausea and vomiting.  Genitourinary:  Positive for flank pain. Negative for dysuria and frequency (Especially at night).  Neurological:  Positive for dizziness and tingling. Negative for headaches.  Psychiatric/Behavioral:  The patient has insomnia.      PHYSICAL EXAM: (per limitations of virtual telephone visit)  The patient is alert and oriented x 3, exhibiting adequate mentation, good mood, and ability to speak in full sentences and execute sound judgement.  ASSESSMENT & PLAN:  1.  Thrombocytopenia, secondary to splenic sequestration (liver cirrhosis/splenomegaly) - Mild to moderate thrombocytopenia since 2016, when she had her AVR with mechanical valve, with worsening platelet counts over the past 2+ years. - Nutritional deficiency work-up, hepatitis panel was negative.  ANA was positive with speckled pattern with elevation of 2320.  She does not have any clinical signs or symptoms of lupus. - No improvement in platelets after trial of Decadron 40 mg x 4 days  - CT abdomen/pelvis (09/01/2021): Decompensated liver cirrhosis with trace ascites and with splenomegaly (15 cm) - Following with [facility] for liver cirrhosis diagnosed in September 2023 - She is on Coumadin and denies any easy bruising or bleeding. - She does exhibit petechial rash and hyperpigmentation of lower extremities (as of visit on 10/06/2020), suspected to be Schamberg's disease rather than due to thrombocytopenia, further discussed below  - Most  recent labs (09/20/2022): Platelets 50, WBC 3.4 with normal differential. - PLAN: Repeat CBC and RTC in 6 months  - If platelet count  < 50 K, would consider holding Coumadin (Hx mechanical AVR).  - Patient is aware of alarm symptoms that would prompt immediate medical attention.   2.  Iron deficiency without anemia - She has a history of microcytic anemia, which has resolved.  Hemoglobin on 08/09/2021 is normal at 14.3/MCV 85.1 - She was previously noted (09/22/2020) to have mild iron deficiency with ferritin 25 and iron saturation 9% -- She had blood transfusions in 2019 due to anemia from menorrhagia.  She is now s/p hysterectomy (April 2019). - EGD (10/24/2021): Small esophageal varices, portal hypertensive gastropathy, and gastritis. - Colonoscopy (10/24/2021): Non-bleeding internal hemorrhoids and polyp x 1. - She takes daily iron supplement, but required IV Venofer 300 mg x 3 in September/October 2023 - Most recent labs (09/20/2022): Hgb 12.8/MCV 88.9, ferritin 79, iron saturation 17% - No rectal bleeding or melena. - Reports baseline fatigue - Per GI, portal hypertensive gastropathy likely etiology of IDA.   - PLAN: Recommend IV Venofer 300 mg x 2 - Continue oral iron supplement.  We will recheck iron panel and CBC in 6 months    3.  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  No peripheral edema.  Nonpruritic/nonpainful. - Lesions appear similar in characteristic to the pattern of Schamberg's disease (a.k.a. " progressive pigmented purpura") - Little suspicion that petechial rash is related to thrombocytopenia, as patient had the skin lesions even when her platelets were > 100 - PLAN: Recommended to patient that she wear compression stockings during her work shifts to see if this alleviates her lesions   - Patient declined dermatology  referral - Patient informed that she should notify us immediately if she has any worsening petechiae or if petechiae spread to other areas of the body   PLAN SUMMARY: >> Venofer 300 mg x 2 >> Labs in 6 months = CBC/D, CMP, ferritin, iron/TIBC >> OFFICE visit in 6 months      I discussed the assessment and treatment plan with the patient. The patient was provided an opportunity to ask questions and all were answered. The patient agreed with the plan and demonstrated an understanding of the instructions.   The patient was advised to call back or seek an in-person evaluation if the symptoms worsen or if the condition fails to improve as anticipated.  I provided 18 minutes of non-face-to-face time during this encounter.  Carnella Guadalajara, PA-C 09/23/22 11:02 AM

## 2022-09-23 ENCOUNTER — Encounter: Payer: Self-pay | Admitting: Physician Assistant

## 2022-09-23 ENCOUNTER — Inpatient Hospital Stay (HOSPITAL_BASED_OUTPATIENT_CLINIC_OR_DEPARTMENT_OTHER): Payer: BC Managed Care – PPO | Admitting: Physician Assistant

## 2022-09-23 DIAGNOSIS — D696 Thrombocytopenia, unspecified: Secondary | ICD-10-CM | POA: Diagnosis not present

## 2022-09-23 DIAGNOSIS — E611 Iron deficiency: Secondary | ICD-10-CM | POA: Diagnosis not present

## 2022-09-23 LAB — AFP TUMOR MARKER: AFP-Tumor Marker: 5.4 ng/mL

## 2022-09-25 NOTE — Patient Instructions (Signed)

## 2022-09-26 ENCOUNTER — Encounter: Payer: BC Managed Care – PPO | Admitting: Nurse Practitioner

## 2022-09-26 DIAGNOSIS — E1165 Type 2 diabetes mellitus with hyperglycemia: Secondary | ICD-10-CM

## 2022-09-26 DIAGNOSIS — Z7984 Long term (current) use of oral hypoglycemic drugs: Secondary | ICD-10-CM

## 2022-09-26 DIAGNOSIS — Z794 Long term (current) use of insulin: Secondary | ICD-10-CM

## 2022-09-26 DIAGNOSIS — E782 Mixed hyperlipidemia: Secondary | ICD-10-CM

## 2022-09-26 NOTE — Progress Notes (Signed)
Erroneous encounter

## 2022-10-01 ENCOUNTER — Inpatient Hospital Stay: Payer: BC Managed Care – PPO

## 2022-10-08 ENCOUNTER — Inpatient Hospital Stay: Payer: BC Managed Care – PPO

## 2022-10-15 ENCOUNTER — Other Ambulatory Visit: Payer: Self-pay | Admitting: Gastroenterology

## 2022-10-15 DIAGNOSIS — I8500 Esophageal varices without bleeding: Secondary | ICD-10-CM

## 2022-10-29 ENCOUNTER — Ambulatory Visit: Payer: BC Managed Care – PPO | Admitting: Cardiology

## 2022-12-11 ENCOUNTER — Ambulatory Visit (INDEPENDENT_AMBULATORY_CARE_PROVIDER_SITE_OTHER): Payer: BC Managed Care – PPO | Admitting: Nurse Practitioner

## 2022-12-11 ENCOUNTER — Other Ambulatory Visit: Payer: Self-pay | Admitting: Nurse Practitioner

## 2022-12-11 ENCOUNTER — Encounter: Payer: Self-pay | Admitting: Nurse Practitioner

## 2022-12-11 VITALS — BP 119/79 | HR 64 | Ht 69.0 in | Wt 215.2 lb

## 2022-12-11 DIAGNOSIS — Z794 Long term (current) use of insulin: Secondary | ICD-10-CM | POA: Diagnosis not present

## 2022-12-11 DIAGNOSIS — Z7984 Long term (current) use of oral hypoglycemic drugs: Secondary | ICD-10-CM

## 2022-12-11 DIAGNOSIS — E1165 Type 2 diabetes mellitus with hyperglycemia: Secondary | ICD-10-CM

## 2022-12-11 MED ORDER — DEXCOM G7 SENSOR MISC: 1.0000 | 3 refills | Status: DC

## 2022-12-11 NOTE — Patient Instructions (Signed)

## 2022-12-11 NOTE — Progress Notes (Signed)
Endocrinology Consult Note       12/11/2022, 4:04 PM   Subjective:    Patient ID: Anna Salinas, female    DOB: 1970-09-27.  Anna Salinas is being seen in consultation for management of currently uncontrolled symptomatic diabetes requested by  Juliette Alcide, MD.   Past Medical History:  Diagnosis Date   Aortic stenosis due to bicuspid aortic valve    Blood transfusion without reported diagnosis    Essential hypertension, benign    Iron deficiency 09/07/2021   Palpitations    S/P laparoscopic hysterectomy 11/26/2017   Type 2 diabetes mellitus (HCC)     Past Surgical History:  Procedure Laterality Date   ABDOMINAL HYSTERECTOMY     AORTIC VALVE REPLACEMENT N/A 02/14/2014   Procedure: AORTIC VALVE REPLACEMENT (AVR);  Surgeon: Alleen Borne, MD;  Location: [facility] OR;  Service: Open Heart Surgery;  Laterality: N/A;   BIOPSY  10/24/2021   Procedure: BIOPSY;  Surgeon: Lanelle Bal, DO;  Location: [facility];  Service: Endoscopy;;   CARDIAC CATHETERIZATION     CESAREAN SECTION  01/15/2007   CHEST TUBE INSERTION Right 03/27/2014   Procedure: CHEST TUBE INSERTION;  Surgeon: Delight Ovens, MD;  Location: [facility] OR;  Service: Thoracic;  Laterality: Right;   COLONOSCOPY WITH PROPOFOL N/A 10/24/2021   Surgeon: Lanelle Bal, DO; Non bleeding internal hemorrhoids, one 4 mm sessile serrated adenoma. Random colon biopsies benign. Repeat in 5 years.   CYSTOSCOPY N/A 11/26/2017   Procedure: CYSTOSCOPY;  Surgeon: Edwinna Areola, DO;  Location: [facility] ORS;  Service: Gynecology;  Laterality: N/A;   ESOPHAGOGASTRODUODENOSCOPY (EGD) WITH PROPOFOL N/A 10/24/2021   Surgeon: Lanelle Bal, DO;   Grade 1 esophageal varices, portal hypertensive gastropathy, gastritis biopsied. Pathology with minimal chronic gastritis   INTRAOPERATIVE TRANSESOPHAGEAL ECHOCARDIOGRAM N/A 02/14/2014   Procedure: INTRAOPERATIVE  TRANSESOPHAGEAL ECHOCARDIOGRAM;  Surgeon: Alleen Borne, MD;  Location: [facility] OR;  Service: Open Heart Surgery;  Laterality: N/A;   LEFT AND RIGHT HEART CATHETERIZATION WITH CORONARY ANGIOGRAM N/A 01/25/2014   Procedure: LEFT AND RIGHT HEART CATHETERIZATION WITH CORONARY ANGIOGRAM;  Surgeon: Peter M Swaziland, MD;  Location: [facility] CATH LAB;  Service: Cardiovascular;  Laterality: N/A;   PERICARDIAL FLUID DRAINAGE N/A 03/27/2014   Procedure: DRAINAGE OF PERICARDIAL FLUID;  Surgeon: Delight Ovens, MD;  Location: [facility] OR;  Service: Thoracic;  Laterality: N/A;   PLEURAL EFFUSION DRAINAGE Right 03/27/2014   Procedure: DRAINAGE OF PLEURAL EFFUSION;  Surgeon: Delight Ovens, MD;  Location: [facility] OR;  Service: Thoracic;  Laterality: Right;  Drainage of right pleural effusion   POLYPECTOMY  10/24/2021   Procedure: POLYPECTOMY INTESTINAL;  Surgeon: Lanelle Bal, DO;  Location: [facility];  Service: Endoscopy;;   SUBXYPHOID PERICARDIAL WINDOW N/A 03/27/2014   Procedure: SUBXYPHOID PERICARDIAL WINDOW;  Surgeon: Delight Ovens, MD;  Location: [facility] OR;  Service: Thoracic;  Laterality: N/A;   TOTAL LAPAROSCOPIC HYSTERECTOMY WITH SALPINGECTOMY Bilateral 11/26/2017   Procedure: TOTAL LAPAROSCOPIC HYSTERECTOMY WITH BILATERAL SALPINGECTOMY LEFT OOPHERECTOMY;  Surgeon: Edwinna Areola, DO;  Location: [facility] ORS;  Service: Gynecology;  Laterality: Bilateral;   TUBAL LIGATION      Social History  Socioeconomic History   Marital status: Married    Spouse name: Not on file   Number of children: 3   Years of education: Not on file   Highest education level: Not on file  Occupational History   Occupation: Lobbyist: [employer]  Tobacco Use   Smoking status: Never   Smokeless tobacco: Never  Vaping Use   Vaping status: Never Used  Substance and Sexual Activity   Alcohol use: No    Alcohol/week: 0.0 standard drinks of alcohol   Drug use: No   Sexual activity: Not on file  Other  Topics Concern   Not on file  Social History Narrative   Not on file   Social Determinants of Health   Financial Resource Strain: Not on file  Food Insecurity: Not on file  Transportation Needs: Not on file  Physical Activity: Not on file  Stress: Not on file  Social Connections: Not on file    Family History  Problem Relation Age of Onset   Diabetes Mother    Heart disease Mother    Brain cancer Brother    Crohn's disease Brother    Colon cancer Maternal Grandmother    Cancer - Colon Maternal Grandmother        90s    Outpatient Encounter Medications as of 12/11/2022  Medication Sig   amoxicillin (AMOXIL) 500 MG capsule Take 1,000 mg by mouth 2 (two) times daily.   Continuous Glucose Sensor (DEXCOM G7 SENSOR) MISC Inject 1 Application into the skin as directed. Change sensor every 10 days as directed.   dicyclomine (BENTYL) 10 MG capsule TAKE 1 CAPSULE BY MOUTH 3 TIMES A DAY BEFORE MEALS AS NEEDED FOR LOOSE STOOLS/ABDOMINAL CRAMPING   metFORMIN (GLUCOPHAGE-XR) 500 MG 24 hr tablet Take 1 tablet (500 mg total) by mouth 2 (two) times daily.   nadolol (CORGARD) 20 MG tablet TAKE 1 TABLET BY MOUTH EVERY DAY   NOVOLOG MIX 70/30 FLEXPEN (70-30) 100 UNIT/ML FlexPen Inject 30-60 Units into the skin See admin instructions. 60 units in the morning. 30 units at night.   rosuvastatin (CRESTOR) 10 MG tablet Take 1 tablet (10 mg total) by mouth daily. (Patient taking differently: Take 5 mg by mouth at bedtime.)   warfarin (COUMADIN) 5 MG tablet Take 0.5-1 tablets (2.5-5 mg total) by mouth See admin instructions. Take 2.5 mg by mouth daily except take 5 mg by mouth on Monday, Wednesday and Friday (Patient taking differently: Take 2.5-5 mg by mouth See admin instructions. Take 5 mg  on Tuesday and Thursday, all the other days take 2.5 mg  in the evening)   No facility-administered encounter medications on file as of 12/11/2022.    ALLERGIES: Allergies  Allergen Reactions   Glipizide Nausea  And Vomiting and Other (See Comments)    GI upset   Metoprolol Other (See Comments)    "Made me sick"   Shellfish Allergy Swelling    Throat and eyes were swollen.  Had difficulty breathing.  Was hospitalized.      VACCINATION STATUS: Immunization History  Administered Date(s) Administered   Influenza,inj,Quad PF,6+ Mos 10/13/2015, 10/28/2016, 10/16/2017   Influenza-Unspecified 11/28/2014   Pneumococcal Polysaccharide-23 11/27/2017   Tdap 04/01/2007    Diabetes She presents for her initial diabetic visit. She has type 2 diabetes mellitus. Onset time: diagnosed at approx age of 89. Her disease course has been worsening. There are no hypoglycemic associated symptoms. Associated symptoms include fatigue, polydipsia, polyuria and weight loss. There  are no hypoglycemic complications. Symptoms are stable. There are no diabetic complications. Risk factors for coronary artery disease include diabetes mellitus and family history. Current diabetic treatment includes insulin injections and oral agent (monotherapy). She is compliant with treatment most of the time. Her weight is fluctuating minimally. She is following a generally unhealthy diet. When asked about meal planning, she reported none. She has not had a previous visit with a dietitian. She participates in exercise intermittently. (She presents today for her consultation, accompanied by her son, with no meter or logs to review.  She notes she does not have a meter at home, thus has not been checking her glucose.  Her most recent A1c on 9/6 was 11.6%, increasing.  She has been dealing with oral infection, on antibiotics.  She drinks mostly water, and occasionally a ginger ale every now and then, eats 2-3 meals per day and does not snack.  She engages in routine physical activity at work, walking.  She is UTD on eye exam, has never seen podiatry in the past.) An ACE inhibitor/angiotensin II receptor blocker is not being taken. She does not see a  podiatrist.Eye exam is current.     Review of systems  Constitutional: + Minimally fluctuating body weight, current Body mass index is 31.78 kg/m., no fatigue, no subjective hyperthermia, no subjective hypothermia Eyes: no blurry vision, no xerophthalmia ENT: no sore throat, no nodules palpated in throat, no dysphagia/odynophagia, no hoarseness Cardiovascular: no chest pain, no shortness of breath, no palpitations, no leg swelling Respiratory: no cough, no shortness of breath Gastrointestinal: no nausea/vomiting/diarrhea Musculoskeletal: no muscle/joint aches Skin: no rashes, no hyperemia Neurological: no tremors, no numbness, no tingling, no dizziness Psychiatric: no depression, no anxiety  Objective:     BP 119/79 (BP Location: Left Arm, Patient Position: Sitting, Cuff Size: Large)   Pulse 64   Ht 5\' 9"  (1.753 m)   Wt 215 lb 3.2 oz (97.6 kg)   LMP 04/14/2017   BMI 31.78 kg/m   Wt Readings from Last 3 Encounters:  12/11/22 215 lb 3.2 oz (97.6 kg)  09/18/22 222 lb (100.7 kg)  03/20/22 219 lb (99.3 kg)     BP Readings from Last 3 Encounters:  12/11/22 119/79  09/18/22 138/77  03/20/22 135/80     Physical Exam- Limited  Constitutional:  Body mass index is 31.78 kg/m. , not in acute distress, normal state of mind Eyes:  EOMI, no exophthalmos Neck: Supple Cardiovascular: RRR, + murmur, rubs, or gallops, no edema Respiratory: Adequate breathing efforts, no crackles, rales, rhonchi, or wheezing Musculoskeletal: no gross deformities, strength intact in all four extremities, no gross restriction of joint movements Skin:  no rashes, no hyperemia Neurological: no tremor with outstretched hands   Diabetic Foot Exam - Simple   No data filed      CMP ( most recent) CMP     Component Value Date/Time   NA 133 (L) 09/20/2022 1245   NA 135 06/01/2018 1112   K 3.6 09/20/2022 1245   CL 101 09/20/2022 1245   CO2 26 09/20/2022 1245   GLUCOSE 414 (H) 09/20/2022 1245    BUN 10 09/20/2022 1245   BUN 9 03/08/2022 0000   CREATININE 0.63 09/20/2022 1245   CREATININE 0.70 09/27/2021 0904   CALCIUM 8.2 (L) 09/20/2022 1245   PROT 6.6 09/20/2022 1245   PROT 7.0 06/01/2018 1112   ALBUMIN 3.7 09/20/2022 1245   ALBUMIN 4.2 06/01/2018 1112   AST 35 09/20/2022 1245   ALT 40  09/20/2022 1245   ALKPHOS 78 09/20/2022 1245   BILITOT 1.6 (H) 09/20/2022 1245   BILITOT 0.6 06/01/2018 1112   EGFR 97.1 03/08/2022 0000   GFRNONAA >60 09/20/2022 1245     Diabetic Labs (most recent): Lab Results  Component Value Date   HGBA1C 11.1 06/17/2022   HGBA1C 9.4 03/08/2022   HGBA1C 10 11/19/2021   MICROALBUR negative 04/14/2014     Lipid Panel ( most recent) Lipid Panel     Component Value Date/Time   CHOL 102 06/01/2018 1112   TRIG 110 06/17/2022 0000   TRIG 198 (H) 04/14/2014 1456   HDL 32 (L) 06/01/2018 1112   HDL 25 (L) 04/14/2014 1456   CHOLHDL 3.2 06/01/2018 1112   CHOLHDL 8.6 04/12/2014 0915   VLDL 33 04/12/2014 0915   LDLCALC 57 06/17/2022 0000   LDLCALC 47 06/01/2018 1112   LABVLDL 23 06/01/2018 1112      Lab Results  Component Value Date   TSH 1.44 03/08/2022   TSH 0.79 09/27/2021   TSH 0.93 01/03/2016   TSH 1.220 03/31/2015           Assessment & Plan:   1) Type 2 diabetes mellitus with hyperglycemia, with long-term current use of insulin (HCC)  She presents today for her consultation, accompanied by her son, with no meter or logs to review.  She notes she does not have a meter at home, thus has not been checking her glucose.  Her most recent A1c on 9/6 was 11.6%, increasing.  She has been dealing with oral infection, on antibiotics.  She drinks mostly water, and occasionally a ginger ale every now and then, eats 2-3 meals per day and does not snack.  She engages in routine physical activity at work, walking.  She is UTD on eye exam, has never seen podiatry in the past.  - Anna Salinas has currently uncontrolled symptomatic type 2 DM  since 52 years of age, with most recent A1c of 11.6 %.   -Recent labs reviewed.  - I had a long discussion with her about the progressive nature of diabetes and the pathology behind its complications. -her diabetes is not currently complicated but she remains at a high risk for more acute and chronic complications which include CAD, CVA, CKD, retinopathy, and neuropathy. These are all discussed in detail with her.  The following Lifestyle Medicine recommendations according to American College of Lifestyle Medicine [facility]) were discussed and offered to patient and she agrees to start the journey:  A. Whole Foods, Plant-based plate comprising of fruits and vegetables, plant-based proteins, whole-grain carbohydrates was discussed in detail with the patient.   A list for source of those nutrients were also provided to the patient.  Patient will use only water or unsweetened tea for hydration. B.  The need to stay away from risky substances including alcohol, smoking; obtaining 7 to 9 hours of restorative sleep, at least 150 minutes of moderate intensity exercise weekly, the importance of healthy social connections,  and stress reduction techniques were discussed. C.  A full color page of  Calorie density of various food groups per pound showing examples of each food groups was provided to the patient.  - I have counseled her on diet and weight management by adopting a carbohydrate restricted/protein rich diet. Patient is encouraged to switch to unprocessed or minimally processed complex starch and increased protein intake (animal or plant source), fruits, and vegetables. -  she is advised to stick to a routine mealtimes  to eat 3 meals a day and avoid unnecessary snacks (to snack only to correct hypoglycemia).   - she acknowledges that there is a room for improvement in her food and drink choices. - Suggestion is made for her to avoid simple carbohydrates from her diet including Cakes, Sweet Desserts, Ice  Cream, Soda (diet and regular), Sweet Tea, Candies, Chips, Cookies, Store Bought Juices, Alcohol in Excess of 1-2 drinks a day, Artificial Sweeteners, Coffee Creamer, and "Sugar-free" Products. This will help patient to have more stable blood glucose profile and potentially avoid unintended weight gain.  - I have approached her with the following individualized plan to manage her diabetes and patient agrees:   -She is advised to continue her 70/30 60 units with breakfast and 30 units with supper if glucose is above 90 and she is eating.    -she is encouraged to start/continue monitoring glucose 4 times daily, before meals and before bed, to log their readings on the clinic sheets provided, and bring them to review at follow up appointment in 3 months.  I did give her sample Dexcom G7 today, assisted her in applying the device and sent in for sensors to her pharmacy.  I anticipate needing to do a PA for this.  - she is warned not to take insulin without proper monitoring per orders. - Adjustment parameters are given to her for hypo and hyperglycemia in writing. - she is encouraged to call clinic for blood glucose levels less than 70 or above 300 mg /dl. - she is advised to continue Metformin 1000 mg ER once daily (was splitting doses), therapeutically suitable for patient .  -She did not tolerate Glipizide in the past, had significant nausea and vomiting with it.  - she will be considered for incretin therapy as appropriate next visit.  - Specific targets for  A1c; LDL, HDL, and Triglycerides were discussed with the patient.  2) Blood Pressure /Hypertension:  her blood pressure is controlled to target without the use of antihypertensive medications.  3) Lipids/Hyperlipidemia:    Review of her recent lipid panel from 06/17/22 showed controlled LDL at 57 .  she is advised to continue Crestor 5 mg daily at bedtime.  Side effects and precautions discussed with her.  4)  Weight/Diet:  her Body mass  index is 31.78 kg/m.  -  clearly complicating her diabetes care.   she is a candidate for weight loss. I discussed with her the fact that loss of 5 - 10% of her  current body weight will have the most impact on her diabetes management.  Exercise, and detailed carbohydrates information provided  -  detailed on discharge instructions.  5) Chronic Care/Health Maintenance: -she is not on ACEI/ARB and is on Statin medications and is encouraged to initiate and continue to follow up with Ophthalmology, Dentist, Podiatrist at least yearly or according to recommendations, and advised to stay away from smoking. I have recommended yearly flu vaccine and pneumonia vaccine at least every 5 years; moderate intensity exercise for up to 150 minutes weekly; and sleep for at least 7 hours a day.  - she is advised to maintain close follow up with Burdine, Ananias Pilgrim, MD for primary care needs, as well as her other providers for optimal and coordinated care.   - Time spent in this patient care: 60 min, of which > 50% was spent in counseling her about her diabetes and the rest reviewing her blood glucose logs, discussing her hypoglycemia and hyperglycemia episodes, reviewing her  current and previous labs/studies (including abstraction from other facilities) and medications doses and developing a long term treatment plan based on the latest standards of care/guidelines; and documenting her care.    Please refer to Patient Instructions for Blood Glucose Monitoring and Insulin/Medications Dosing Guide" in media tab for additional information. Please also refer to "Patient Self Inventory" in the Media tab for reviewed elements of pertinent patient history.  Anna Salinas participated in the discussions, expressed understanding, and voiced agreement with the above plans.  All questions were answered to her satisfaction. she is encouraged to contact clinic should she have any questions or concerns prior to her return  visit.     Follow up plan: - Return in about 3 months (around 03/13/2023) for Diabetes F/U with A1c in office, No previsit labs, Bring meter and logs.    Ronny Bacon, [facility] [address] [place], [place] [address] Phone: [phone] Fax: [phone]  12/11/2022, 4:04 PM

## 2022-12-19 ENCOUNTER — Telehealth: Payer: Self-pay

## 2022-12-19 ENCOUNTER — Telehealth: Payer: Self-pay | Admitting: *Deleted

## 2022-12-19 ENCOUNTER — Other Ambulatory Visit (HOSPITAL_COMMUNITY): Payer: Self-pay

## 2022-12-19 ENCOUNTER — Encounter: Payer: Self-pay | Admitting: Hematology

## 2022-12-19 NOTE — Telephone Encounter (Signed)
Per the patient's pharmacy, she will need a PA for the Dexcom G7 sensor. Sending to the PA team .

## 2022-12-19 NOTE — Telephone Encounter (Signed)
Pharmacy Patient Advocate Encounter   Received notification from Pt Calls Messages that prior authorization for Dexcom G7 sensor is required/requested.   Insurance verification completed.   The patient is insured through [employer] [facility] .   Per test claim: PA required; PA submitted to above mentioned insurance via CoverMyMeds Key/confirmation #/EOC [id number] Status is pending

## 2022-12-20 NOTE — Telephone Encounter (Signed)
Pharmacy Patient Advocate Encounter  Received notification from [employer] that Prior Authorization for Dexcom G7 sensor has been APPROVED from 12/19/22 to 12/19/23   PA #/Case ID/Reference #: [id number]

## 2022-12-20 NOTE — Telephone Encounter (Signed)
Patient was called and a message was left, letting the patient know that her sensors had been approved.

## 2023-01-02 NOTE — Progress Notes (Deleted)
CARDIOLOGY OFFICE NOTE  Date:  01/02/2023    Anna Salinas Date of Birth: 15-Dec-1970 Medical Record #[id number]  PCP:  Juliette Alcide, MD  Cardiologist:  Renna Kilmer [place]  MD  No chief complaint on file.    History of Present Illness: Anna Salinas is a 52 y.o. female who presents for follow up AVR.   She has a history of AVR in 02/2014 with #21 [facility] valve for severe AS with a bicuspid AV. She is on chronic coumadin.  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 These were drained. Repeat CXR and Echo in April 2016 showed resolution of effusions and normal valve function.   In Dec 2017 she was complaining of fatigue with periods of exhaustion. No specific cardiac cause found. Felt to be related to poorly controlled DM with A1c 10.5%. She was started on insulin along with metformin.   She was admitted 2019 with severe anemia related to Dysfunctional uterine bleeding. Hgb down to 6.6. Transfused 2 units PRBCs and Hgb improved to 8.8. She did have placement of IUD in April to try and help with the heavy menses. Unable to stop Coumadin due to mechanical AV.  During hospitalization she was noted to have elevated troponin to 0.48. This was felt to be related to demand ischemia secondary to anemia.   On November 13 she underwent laparoscopic total hysterectomy for definitive treatment of her uterine bleeding.   She had elbow surgery in July 2020 after she fell and fractured it.   She reports she is doing well. She has thrombocytopenia and is being  evaluated by hematology.  No bleeding issues although she states she bruises easily.   She is active with work. She has lost 34 lbs but sugar control still difficult. Last A1c 11.8%.  No palpitations, dizziness, SOB.   Past Medical History:  Diagnosis Date   Aortic stenosis due to bicuspid aortic valve    Blood transfusion without reported diagnosis    Essential hypertension, benign     Iron deficiency 09/07/2021   Palpitations    S/P laparoscopic hysterectomy 11/26/2017   Type 2 diabetes mellitus (HCC)     Past Surgical History:  Procedure Laterality Date   ABDOMINAL HYSTERECTOMY     AORTIC VALVE REPLACEMENT N/A 02/14/2014   Procedure: AORTIC VALVE REPLACEMENT (AVR);  Surgeon: Alleen Borne, MD;  [facility] OR;  Service: Open Heart Surgery;  Laterality: N/A;   BIOPSY  10/24/2021   Procedure: BIOPSY;  Surgeon: Lanelle Bal, DO;  Location: [facility] SUITE;  Service: Endoscopy;;   CARDIAC CATHETERIZATION     CESAREAN SECTION  01/15/2007   CHEST TUBE INSERTION Right 03/27/2014   Procedure: CHEST TUBE INSERTION;  Surgeon: Delight Ovens, MD;  Location: [facility] OR;  Service: Thoracic;  Laterality: Right;   COLONOSCOPY WITH PROPOFOL N/A 10/24/2021   Surgeon: Lanelle Bal, DO; Non bleeding internal hemorrhoids, one 4 mm sessile serrated adenoma. Random colon biopsies benign. Repeat in 5 years.   CYSTOSCOPY N/A 11/26/2017   Procedure: CYSTOSCOPY;  Surgeon: Edwinna Areola, DO;  Location: [facility] ORS;  Service: Gynecology;  Laterality: N/A;   ESOPHAGOGASTRODUODENOSCOPY (EGD) WITH PROPOFOL N/A 10/24/2021   Surgeon: Lanelle Bal, DO;   Grade 1 esophageal varices, portal hypertensive gastropathy, gastritis biopsied. Pathology with minimal chronic gastritis   INTRAOPERATIVE TRANSESOPHAGEAL ECHOCARDIOGRAM N/A 02/14/2014   Procedure: INTRAOPERATIVE TRANSESOPHAGEAL ECHOCARDIOGRAM;  Surgeon: Alleen Borne, MD;  Location: [facility] OR;  Service: Open Heart Surgery;  Laterality: N/A;   LEFT AND RIGHT HEART CATHETERIZATION WITH CORONARY ANGIOGRAM N/A 01/25/2014   Procedure: LEFT AND RIGHT HEART CATHETERIZATION WITH CORONARY ANGIOGRAM;  Surgeon: Elimelech Houseman M [place], MD;  Location[facility] CATH LAB;  Service: Cardiovascular;  Laterality: N/A;   PERICARDIAL FLUID DRAINAGE N/A 03/27/2014   Procedure: DRAINAGE OF PERICARDIAL FLUID;  Surgeon: Delight Ovens, MD;  Location: [facility] OR;  Service:  Thoracic;  Laterality: N/A;   PLEURAL EFFUSION DRAINAGE Right 03/27/2014   Procedure: DRAINAGE OF PLEURAL EFFUSION;  Surgeon: Delight Ovens, MD;  Location[facility] OR;  Service: Thoracic;  Laterality: Right;  Drainage of right pleural effusion   POLYPECTOMY  10/24/2021   Procedure: POLYPECTOMY INTESTINAL;  Surgeon: Lanelle Bal, DO;  Location: [facility];  Service: Endoscopy;;   SUBXYPHOID PERICARDIAL WINDOW N/A 03/27/2014   Procedure: SUBXYPHOID PERICARDIAL WINDOW;  Surgeon: Delight Ovens, MD;  Location: [facility] OR;  Service: Thoracic;  Laterality: N/A;   TOTAL LAPAROSCOPIC HYSTERECTOMY WITH SALPINGECTOMY Bilateral 11/26/2017   Procedure: TOTAL LAPAROSCOPIC HYSTERECTOMY WITH BILATERAL SALPINGECTOMY LEFT OOPHERECTOMY;  Surgeon: Edwinna Areola, DO;  Location: [facility] ORS;  Service: Gynecology;  Laterality: Bilateral;   TUBAL LIGATION       Medications: Current Outpatient Medications  Medication Sig Dispense Refill   amoxicillin (AMOXIL) 500 MG capsule Take 1,000 mg by mouth 2 (two) times daily.     Continuous Glucose Sensor (DEXCOM G7 SENSOR) MISC INJECT 1 APPLICATION INTO THE SKIN AS DIRECTED. CHANGE SENSOR EVERY 10 DAYS AS DIRECTED. 9 each 3   dicyclomine (BENTYL) 10 MG capsule TAKE 1 CAPSULE BY MOUTH 3 TIMES A DAY BEFORE MEALS AS NEEDED FOR LOOSE STOOLS/ABDOMINAL CRAMPING 270 capsule 3   metFORMIN (GLUCOPHAGE-XR) 500 MG 24 hr tablet Take 1 tablet (500 mg total) by mouth 2 (two) times daily. 180 tablet 1   nadolol (CORGARD) 20 MG tablet TAKE 1 TABLET BY MOUTH EVERY DAY 90 tablet 0   NOVOLOG MIX 70/30 FLEXPEN (70-30) 100 UNIT/ML FlexPen Inject 30-60 Units into the skin See admin instructions. 60 units in the morning. 30 units at night.     rosuvastatin (CRESTOR) 10 MG tablet Take 1 tablet (10 mg total) by mouth daily. (Patient taking differently: Take 5 mg by mouth at bedtime.) 90 tablet 3   warfarin (COUMADIN) 5 MG tablet Take 0.5-1 tablets (2.5-5 mg total) by mouth See admin  instructions. Take 2.5 mg by mouth daily except take 5 mg by mouth on Monday, Wednesday and Friday (Patient taking differently: Take 2.5-5 mg by mouth See admin instructions. Take 5 mg  on Tuesday and Thursday, all the other days take 2.5 mg  in the evening) 30 tablet 5   No current facility-administered medications for this visit.    Allergies: Allergies  Allergen Reactions   Glipizide Nausea And Vomiting and Other (See Comments)    GI upset   Metoprolol Other (See Comments)    "Made me sick"   Shellfish Allergy Swelling    Throat and eyes were swollen.  Had difficulty breathing.  Was hospitalized.      Social History: The patient  reports that she has never smoked. She has never used smokeless tobacco. She reports that she does not drink alcohol and does not use drugs.   Family History: The patient's family history includes Brain cancer in her brother; Cancer - Colon in her maternal grandmother; Colon cancer in her maternal grandmother; Crohn's disease in her brother; Diabetes in her mother; Heart disease  in her mother.   Review of Systems: Please see the history of present illness.  Glucose has been elevated.  All other systems are reviewed and negative.   Physical Exam: VS:  LMP 04/14/2017  .  BMI There is no height or weight on file to calculate BMI.  Wt Readings from Last 3 Encounters:  12/11/22 215 lb 3.2 oz (97.6 kg)  09/18/22 222 lb (100.7 kg)  03/20/22 219 lb (99.3 kg)   GENERAL:  Well appearing obese WF in NAD HEENT:  PERRL, EOMI, sclera are clear. Oropharynx is clear. NECK:  No jugular venous distention, carotid upstroke brisk and symmetric, no bruits, no thyromegaly or adenopathy LUNGS:  Clear to auscultation bilaterally CHEST:  Unremarkable HEART:  RRR,  PMI not displaced or sustained,good mechanical S2. , no S3, no S4:  no rubs ABD:  Soft, nontender. BS +, no masses or bruits. No hepatomegaly, no splenomegaly EXT:  2 + pulses throughout, no edema, no cyanosis no  clubbing SKIN:  Warm and dry. Chronic splotchy brown discoloration below knees.  NEURO:  Alert and oriented x 3. Cranial nerves II through XII intact. PSYCH:  Cognitively intact    LABORATORY DATA:  EKG:  EKG is ordered today. NSR rate 70.  Minor nonspecific ST abnormality.  I have personally reviewed and interpreted this study.   Lab Results  Component Value Date   WBC 3.4 (L) 09/20/2022   HGB 12.8 09/20/2022   HCT 39.3 09/20/2022   PLT 50 (L) 09/20/2022   GLUCOSE 414 (H) 09/20/2022   CHOL 102 06/01/2018   TRIG 110 06/17/2022   HDL 32 (L) 06/01/2018   LDLCALC 57 06/17/2022   ALT 40 09/20/2022   AST 35 09/20/2022   NA 133 (L) 09/20/2022   K 3.6 09/20/2022   CL 101 09/20/2022   CREATININE 0.63 09/20/2022   BUN 10 09/20/2022   CO2 26 09/20/2022   TSH 1.44 03/08/2022   INR 2.9 (H) 09/27/2021   HGBA1C 11.1 06/17/2022   MICROALBUR negative 04/14/2014   Dated 11/10/18: A1c 9.1%, CBC and CMET normal.  Cholesterol 97, triglycerides 98, HDL 33, LDL 47. Dated 08/06/19: cholesterol 111, triglycerides 100, HDL 30, LDL 62.  Dated 01/06/20: A1c 8.2%. CMET normal. Dated 04/18/21: cholesterol 119, triglycerides 30, HDL 33, LDL 77. A1c 12.5%. CMET and TSH normal. Dated 08/01/21: normal LFTs, TSH. A1c 11.8%.  Dated 12/25/22: A1c 11.8%.   BNP (last 3 results) No results for input(s): "BNP" in the last 8760 hours.  ProBNP (last 3 results) No results for input(s): "PROBNP" in the last 8760 hours.   Other Studies Reviewed Today:   Echo: 03/26/2014 Study Conclusions - Left ventricle: The cavity size was normal. Wall thickness was   normal. Systolic function was vigorous. The estimated ejection   fraction was in the range of 65% to 70%. Wall motion was normal;   there were no regional wall motion abnormalities. - Aortic valve: A [employer] mechanical prosthesis was   present. No obvious perivalvular leak based on limited views.   There was no significant regurgitation. Mean  gradient (S): 15 mm   Hg. Peak gradient (S): 28 mm Hg. - Right ventricle: The cavity size was below normal. - Tricuspid valve: There was trivial regurgitation. - Pulmonary arteries: Systolic pressure could not be accurately   estimated. - Pericardium, extracardiac: A large pericardial effusion was   identified circumferential to the heart, measures approximately 4   cm posteriorly and 3 cm anteriorly. There is compression of  the   right ventricle noted in the subcostal view and further evidence   of tamponade physiology based on respiratory variation in mitral   inflow. There was a right pleural effusion. Impressions: - Normal LV wall thickness with LVEF 65-70%. [employer] mechanical   AVR without obvious perivalvular leak based on limited and with   grossly normal gradients. A large pericardial effusion was   identified circumferential to the heart, measures approximately 4   cm posteriorly and 3-4 cm anteriorly. There is compression of the   right ventricle noted in the subcostal view and further evidence   of tamponade physiology based on respiratory variation in mitral   inflow. Right pleural effusion also noted. Results discussed with   Dr. Tyrone Sage.  Echo 06/19/17: Study Conclusions   - Left ventricle: The cavity size was normal. Systolic function was   normal. The estimated ejection fraction was in the range of 60%   to 65%. Wall motion was normal; there were no regional wall   motion abnormalities. Left ventricular diastolic function   parameters were normal. - Aortic valve: There was very mild stenosis. - Right ventricle: The cavity size was normal. Wall thickness was   normal. Systolic function was normal. - Right atrium: The atrium was normal in size. - Tricuspid valve: Structurally normal valve. There was mild   regurgitation. - Pulmonary arteries: The main pulmonary artery was normal-sized.   Systolic pressure was within the normal range. - Inferior vena cava: The  vessel was normal in size. - Pericardium, extracardiac: There was no pericardial effusion.   Impressions:   - S/P AVR with a mechanical valve. Leaflets are not well   visualized. Transaortic gradients are slightly higher than on a   prior study from 04/15/2014 now peak/mean 38/19 mmHg, peak velocity   3.1 m/s, but still within upper normal range for this type of   prosthesis.  CT abdomen/pelvis: 09/01/21: IMPRESSION: 1. No acute intra-abdominal or pelvic pathology. 2. Decompensated cirrhosis with splenomegaly and trace ascites. 3. Aortic Atherosclerosis (ICD10-I70.0).      Assessment/Plan: 1. S/p AVR in 2016 with mechanical prosthesis. Echo in 2019 showed normal prosthetic valve function. Exam is normal today and Ecg looks good. Continue long term coumadin. Reviewed recommendations for SBE prophylaxis. Thrombocytopenia is a concern with her being on Coumadin but this appears to likely be related to splenic sequestration. Will continue coumadin for now.   2. DM-  Per primary care. On insulin. Reports last A1c 11.8%. poor control. She has lost weight but if doesn't improve may need endocrinology evaluation.    3. Chronic coumadin therapy. INR monitored by primary care.   4. Thrombocytopenia. Work up per hematology. Recent CT suggests this is related to splenic sequestration.   5. Cirrhosis. Noted on CT. Etiology is unclear. No history of Etoh. Consider hepatitis (prior transfusions), ? NASH. Further work up per hematology and PCP. No significant edema on exam.    Disposition:   FU with me in one year  Signed: Sharilyn Geisinger [place] MD, [facility]    01/02/2023 1:41 PM

## 2023-01-09 ENCOUNTER — Ambulatory Visit: Payer: BC Managed Care – PPO | Admitting: Cardiology

## 2023-01-12 ENCOUNTER — Other Ambulatory Visit: Payer: Self-pay | Admitting: Gastroenterology

## 2023-01-12 DIAGNOSIS — R195 Other fecal abnormalities: Secondary | ICD-10-CM

## 2023-01-22 NOTE — Progress Notes (Signed)
 CARDIOLOGY OFFICE NOTE  Date:  01/28/2023    Anna Salinas Eth Date of Birth: January 27, 1970 Medical Record #[id number]  PCP:  Lari Elspeth BRAVO, MD  Cardiologist:  Sosie Gato  MD  No chief complaint on file.    History of Present Illness: Anna Salinas is a 53 y.o. female who presents for follow up AVR.   She has a history of AVR in 02/2014 with #21 [facility] valve for severe AS with a bicuspid AV. She is on chronic coumadin .  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 These were drained. Repeat CXR and Echo in April 2016 showed resolution of effusions and normal valve function.   In Dec 2017 she was complaining of fatigue with periods of exhaustion. No specific cardiac cause found. Felt to be related to poorly controlled DM with A1c 10.5%. She was started on insulin  along with metformin .   She was admitted 2019 with severe anemia related to Dysfunctional uterine bleeding. Hgb down to 6.6. Transfused 2 units PRBCs and Hgb improved to 8.8. She did have placement of IUD in April to try and help with the heavy menses. Unable to stop Coumadin  due to mechanical AV.  During hospitalization she was noted to have elevated troponin to 0.48. This was felt to be related to demand ischemia secondary to anemia.   On November 13 she underwent laparoscopic total hysterectomy for definitive treatment of her uterine bleeding.   She reports she is doing well. She has thrombocytopenia and is being  followed by hematology. This is felt to be related to splenic sequestration from her cirrhosis.  No bleeding issues. Some petechia. Notes last plt count 49K.    She is active with work. Sugar control still difficult. Last A1c 11.8%. now seeing endocrinology- Dr Benton Rio.  No palpitations, dizziness, SOB. Reports last INR 2.1. if she were to have significant bleeding we would need to reconsider.   Past Medical History:  Diagnosis Date   Aortic  stenosis due to bicuspid aortic valve    Blood transfusion without reported diagnosis    Essential hypertension, benign    Iron  deficiency 09/07/2021   Palpitations    S/P laparoscopic hysterectomy 11/26/2017   Type 2 diabetes mellitus (HCC)     Past Surgical History:  Procedure Laterality Date   ABDOMINAL HYSTERECTOMY     AORTIC VALVE REPLACEMENT N/A 02/14/2014   Procedure: AORTIC VALVE REPLACEMENT (AVR);  Surgeon: Dorise MARLA Fellers, MD;  Location: [facility] OR;  Service: Open Heart Surgery;  Laterality: N/A;   BIOPSY  10/24/2021   Procedure: BIOPSY;  Surgeon: Cindie Carlin MARLA, DO;  [facility]: [facility];  Service: Endoscopy;;   CARDIAC CATHETERIZATION     CESAREAN SECTION  01/15/2007   CHEST TUBE INSERTION Right 03/27/2014   Procedure: CHEST TUBE INSERTION;  Surgeon: [place] KATHEE Jude, MD;  Location: [facility] OR;  Service: Thoracic;  Laterality: Right;   COLONOSCOPY WITH PROPOFOL  N/A 10/24/2021   Surgeon: Cindie Carlin MARLA, DO; Non bleeding internal hemorrhoids, one 4 mm sessile serrated adenoma. Random colon biopsies benign. Repeat in 5 years.   CYSTOSCOPY N/A 11/26/2017   Procedure: CYSTOSCOPY;  Surgeon: Delana Ted Morrison, DO;  Location: [facility] ORS;  Service: Gynecology;  Laterality: N/A;   ESOPHAGOGASTRODUODENOSCOPY (EGD) WITH PROPOFOL  N/A 10/24/2021   Surgeon: Cindie Carlin MARLA, DO;   Grade 1 esophageal varices, portal hypertensive gastropathy, gastritis biopsied. Pathology with minimal chronic gastritis   INTRAOPERATIVE TRANSESOPHAGEAL ECHOCARDIOGRAM N/A 02/14/2014  Procedure: INTRAOPERATIVE TRANSESOPHAGEAL ECHOCARDIOGRAM;  Surgeon: Dorise MARLA Fellers, MD;  Location: [facility] OR;  Service: Open Heart Surgery;  Laterality: N/A;   LEFT AND RIGHT HEART CATHETERIZATION WITH CORONARY ANGIOGRAM N/A 01/25/2014   Procedure: LEFT AND RIGHT HEART CATHETERIZATION WITH CORONARY ANGIOGRAM;  Surgeon: Scotlyn Mccranie M Sopheap Boehle, MD;  Location: [facility] CATH LAB;  Service: Cardiovascular;  Laterality: N/A;   PERICARDIAL FLUID DRAINAGE  N/A 03/27/2014   Procedure: DRAINAGE OF PERICARDIAL FLUID;  Surgeon: [place] KATHEE Jude, MD;  Location: [facility] OR;  Service: Thoracic;  Laterality: N/A;   PLEURAL EFFUSION DRAINAGE Right 03/27/2014   Procedure: DRAINAGE OF PLEURAL EFFUSION;  Surgeon: [place] KATHEE Jude, MD;  Location: [facility] OR;  Service: Thoracic;  Laterality: Right;  Drainage of right pleural effusion   POLYPECTOMY  10/24/2021   Procedure: POLYPECTOMY INTESTINAL;  Surgeon: Cindie Carlin MARLA, DO;  [facility]: [facility];  Service: Endoscopy;;   SUBXYPHOID PERICARDIAL WINDOW N/A 03/27/2014   Procedure: SUBXYPHOID PERICARDIAL WINDOW;  Surgeon: [place] KATHEE Jude, MD;  Location: [facility] OR;  Service: Thoracic;  Laterality: N/A;   TOTAL LAPAROSCOPIC HYSTERECTOMY WITH SALPINGECTOMY Bilateral 11/26/2017   Procedure: TOTAL LAPAROSCOPIC HYSTERECTOMY WITH BILATERAL SALPINGECTOMY LEFT OOPHERECTOMY;  Surgeon: Delana Ted Morrison, DO;  Location: [facility] ORS;  Service: Gynecology;  Laterality: Bilateral;   TUBAL LIGATION       Medications: Current Outpatient Medications  Medication Sig Dispense Refill   amoxicillin (AMOXIL) 500 MG capsule Take 1,000 mg by mouth 2 (two) times daily.     Continuous Glucose Sensor (DEXCOM G7 SENSOR) MISC INJECT 1 APPLICATION INTO THE SKIN AS DIRECTED. CHANGE SENSOR EVERY 10 DAYS AS DIRECTED. 9 each 3   dicyclomine  (BENTYL ) 10 MG capsule TAKE 1 CAPSULE BY MOUTH 3 TIMES A DAY BEFORE MEALS AS NEEDED FOR LOOSE STOOLS/ABDOMINAL CRAMPING 270 capsule 3   metFORMIN  (GLUCOPHAGE -XR) 500 MG 24 hr tablet Take 1 tablet (500 mg total) by mouth 2 (two) times daily. 180 tablet 1   nadolol  (CORGARD ) 20 MG tablet TAKE 1 TABLET BY MOUTH EVERY DAY 90 tablet 0   NOVOLOG  MIX 70/30 FLEXPEN (70-30) 100 UNIT/ML FlexPen Inject 30-60 Units into the skin See admin instructions. 60 units in the morning. 30 units at night.     rosuvastatin  (CRESTOR ) 10 MG tablet Take 1 tablet (10 mg total) by mouth daily. (Patient taking differently: Take 5 mg by mouth at  bedtime.) 90 tablet 3   warfarin (COUMADIN ) 5 MG tablet Take 0.5-1 tablets (2.5-5 mg total) by mouth See admin instructions. Take 2.5 mg by mouth daily except take 5 mg by mouth on Monday, Wednesday and Friday (Patient taking differently: Take 2.5-5 mg by mouth See admin instructions. Take 5 mg  on Tuesday and Thursday, all the other days take 2.5 mg  in the evening) 30 tablet 5   No current facility-administered medications for this visit.    Allergies: Allergies  Allergen Reactions   Glipizide Nausea And Vomiting and Other (See Comments)    GI upset   Metoprolol  Other (See Comments)    Made me sick   Shellfish Allergy Swelling    Throat and eyes were swollen.  Had difficulty breathing.  Was hospitalized.      Social History: The patient  reports that she has never smoked. She has never used smokeless tobacco. She reports that she does not drink alcohol and does not use drugs.   Family History: The patient's family history includes Brain cancer in her brother; Cancer - Colon in her maternal grandmother; Colon cancer  in her maternal grandmother; Crohn's disease in her brother; Diabetes in her mother; Heart disease in her mother.   Review of Systems: Please see the history of present illness.  Glucose has been elevated.  All other systems are reviewed and negative.   Physical Exam: VS:  BP 132/74   Pulse (!) 58   Ht 5' 9 (1.753 m)   Wt 218 lb 12.8 oz (99.2 kg)   LMP 04/14/2017   SpO2 97%   BMI 32.31 kg/m  .  BMI Body mass index is 32.31 kg/m.  Wt Readings from Last 3 Encounters:  01/28/23 218 lb 12.8 oz (99.2 kg)  12/11/22 215 lb 3.2 oz (97.6 kg)  09/18/22 222 lb (100.7 kg)   GENERAL:  Well appearing obese WF in NAD HEENT:  PERRL, EOMI, sclera are clear. Oropharynx is clear. NECK:  No jugular venous distention, carotid upstroke brisk and symmetric, no bruits, no thyromegaly or adenopathy LUNGS:  Clear to auscultation bilaterally CHEST:  Unremarkable HEART:  RRR,  PMI  not displaced or sustained,good mechanical S2. , no S3, no S4:  no rubs ABD:  Soft, nontender. BS +, no masses or bruits. No hepatomegaly, no splenomegaly EXT:  2 + pulses throughout, no edema, no cyanosis no clubbing SKIN:  Warm and dry. Chronic splotchy brown discoloration below knees.  NEURO:  Alert and oriented x 3. Cranial nerves II through XII intact. PSYCH:  Cognitively intact    LABORATORY DATA:  EKG Interpretation Date/Time:  Tuesday January 28 2023 09:55:43 EST Ventricular Rate:  58 PR Interval:  164 QRS Duration:  82 QT Interval:  426 QTC Calculation: 418 R Axis:   43  Text Interpretation: Sinus bradycardia Nonspecific ST abnormality When compared with ECG of 01-Jun-2017 08:27, ST now depressed in Lateral leads T wave inversion no longer evident in Inferior leads Confirmed by Littie Chiem [phone]) on 01/28/2023 10:05:31 AM     Lab Results  Component Value Date   WBC 3.4 (L) 09/20/2022   HGB 12.8 09/20/2022   HCT 39.3 09/20/2022   PLT 50 (L) 09/20/2022   GLUCOSE 414 (H) 09/20/2022   CHOL 102 06/01/2018   TRIG 110 06/17/2022   HDL 32 (L) 06/01/2018   LDLCALC 57 06/17/2022   ALT 40 09/20/2022   AST 35 09/20/2022   NA 133 (L) 09/20/2022   K 3.6 09/20/2022   CL 101 09/20/2022   CREATININE 0.63 09/20/2022   BUN 10 09/20/2022   CO2 26 09/20/2022   TSH 1.44 03/08/2022   INR 2.9 (H) 09/27/2021   HGBA1C 11.1 06/17/2022   MICROALBUR negative 04/14/2014   Dated 11/10/18: A1c 9.1%, CBC and CMET normal.  Cholesterol 97, triglycerides 98, HDL 33, LDL 47. Dated 08/06/19: cholesterol 111, triglycerides 100, HDL 30, LDL 62.  Dated 01/06/20: A1c 8.2%. CMET normal. Dated 04/18/21: cholesterol 119, triglycerides 30, HDL 33, LDL 77. A1c 12.5%. CMET and TSH normal. Dated 08/01/21: normal LFTs, TSH. A1c 11.8%.  Dated 12/25/22: A1c 11.8%.   BNP (last 3 results) No results for input(s): BNP in the last 8760 hours.  ProBNP (last 3 results) No results for input(s): PROBNP in  the last 8760 hours.   Other Studies Reviewed Today:   Echo: 03/26/2014 Study Conclusions - Left ventricle: The cavity size was normal. Wall thickness was   normal. Systolic function was vigorous. The estimated ejection   fraction was in the range of 65% to 70%. Wall motion was normal;   there were no regional wall motion abnormalities. - Aortic  valve: A [employer] mechanical prosthesis was   present. No obvious perivalvular leak based on limited views.   There was no significant regurgitation. Mean gradient (S): 15 mm   Hg. Peak gradient (S): 28 mm Hg. - Right ventricle: The cavity size was below normal. - Tricuspid valve: There was trivial regurgitation. - Pulmonary arteries: Systolic pressure could not be accurately   estimated. - Pericardium, extracardiac: A large pericardial effusion was   identified circumferential to the heart, measures approximately 4   cm posteriorly and 3 cm anteriorly. There is compression of the   right ventricle noted in the subcostal view and further evidence   of tamponade physiology based on respiratory variation in mitral   inflow. There was a right pleural effusion. Impressions: - Normal LV wall thickness with LVEF 65-70%. St. Jude mechanical   AVR without obvious perivalvular leak based on limited and with   grossly normal gradients. A large pericardial effusion was   identified circumferential to the heart, measures approximately 4   cm posteriorly and 3-4 cm anteriorly. There is compression of the   right ventricle noted in the subcostal view and further evidence   of tamponade physiology based on respiratory variation in mitral   inflow. Right pleural effusion also noted. Results discussed with   Dr. Army.  Echo 06/19/17: Study Conclusions   - Left ventricle: The cavity size was normal. Systolic function was   normal. The estimated ejection fraction was in the range of 60%   to 65%. Wall motion was normal; there were no regional  wall   motion abnormalities. Left ventricular diastolic function   parameters were normal. - Aortic valve: There was very mild stenosis. - Right ventricle: The cavity size was normal. Wall thickness was   normal. Systolic function was normal. - Right atrium: The atrium was normal in size. - Tricuspid valve: Structurally normal valve. There was mild   regurgitation. - Pulmonary arteries: The main pulmonary artery was normal-sized.   Systolic pressure was within the normal range. - Inferior vena cava: The vessel was normal in size. - Pericardium, extracardiac: There was no pericardial effusion.   Impressions:   - S/P AVR with a mechanical valve. Leaflets are not well   visualized. Transaortic gradients are slightly higher than on a   prior study from 04/15/2014 now peak/mean 38/19 mmHg, peak velocity   3.1 m/s, but still within upper normal range for this type of   prosthesis.  CT abdomen/pelvis: 09/01/21: IMPRESSION: 1. No acute intra-abdominal or pelvic pathology. 2. Decompensated cirrhosis with splenomegaly and trace ascites. 3. Aortic Atherosclerosis (ICD10-I70.0).      Assessment/Plan: 1. S/p AVR in 2016 with mechanical prosthesis. Echo in 2019 showed normal prosthetic valve function. Exam is normal today and Ecg looks good. Continue long term coumadin . Reviewed recommendations for SBE prophylaxis. Thrombocytopenia is a concern with her being on Coumadin  but this appears to likely be related to splenic sequestration. Her plt count has been between 50-60K for the most part. Will continue coumadin  for now. I would certainly target INR in the low therapeutic range 2-2.5.   2. DM-  Per primary care. On insulin . Reports last A1c 11.8%. poor control. Now seeing endocrinology.  3. Chronic coumadin  therapy. INR monitored by primary care.   4. Thrombocytopenia. Work up per hematology. This is related to splenic sequestration from her cirrhosis.   5. Cirrhosis. Noted on CT. Etiology is  unclear. Seen by GI and felt to be from MASLD. No history  of Etoh. Now on Coreg .    Disposition:   FU with me in one year  Signed: Pharaoh Pio MD, [facility]    01/28/2023 10:05 AM          CARDIOLOGY OFFICE NOTE  Date:  01/28/2023    Anna Salinas Eth Date of Birth: [birth year] Medical Record #[id number]  PCP:  Lari Elspeth BRAVO, MD  Cardiologist:  Wade Sigala  MD  No chief complaint on file.    History of Present Illness: Anna Salinas is a 53 y.o. female who presents for follow up AVR.   She has a history of AVR in 02/2014 with #21 [facility] valve for severe AS with a bicuspid AV. She is on chronic coumadin .  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 These were drained. Repeat CXR and Echo in April 2016 showed resolution of effusions and normal valve function.   In Dec 2017 she was complaining of fatigue with periods of exhaustion. No specific cardiac cause found. Felt to be related to poorly controlled DM with A1c 10.5%. She was started on insulin  along with metformin .   She was admitted 2019 with severe anemia related to Dysfunctional uterine bleeding. Hgb down to 6.6. Transfused 2 units PRBCs and Hgb improved to 8.8. She did have placement of IUD in April to try and help with the heavy menses. Unable to stop Coumadin  due to mechanical AV.  During hospitalization she was noted to have elevated troponin to 0.48. This was felt to be related to demand ischemia secondary to anemia.   On November 13 she underwent laparoscopic total hysterectomy for definitive treatment of her uterine bleeding.   She had elbow surgery in July 2020 after she fell and fractured it.   She reports she is doing well. She has thrombocytopenia and is being  evaluated by hematology.  No bleeding issues although she states she bruises easily.   She is active with work. She has lost 34 lbs but sugar control still difficult. Last A1c 11.8%.  No palpitations,  dizziness, SOB.   Past Medical History:  Diagnosis Date   Aortic stenosis due to bicuspid aortic valve    Blood transfusion without reported diagnosis    Essential hypertension, benign    Iron  deficiency 09/07/2021   Palpitations    S/P laparoscopic hysterectomy 11/26/2017   Type 2 diabetes mellitus (HCC)     Past Surgical History:  Procedure Laterality Date   ABDOMINAL HYSTERECTOMY     AORTIC VALVE REPLACEMENT N/A 02/14/2014   Procedure: AORTIC VALVE REPLACEMENT (AVR);  Surgeon: Dorise MARLA Fellers, MD;  Location: [facility] OR;  Service: Open Heart Surgery;  Laterality: N/A;   BIOPSY  10/24/2021   Procedure: BIOPSY;  Surgeon: Cindie Carlin MARLA, DO;  [facility];  Service: Endoscopy;;   CARDIAC CATHETERIZATION     CESAREAN SECTION  01/15/2007   CHEST TUBE INSERTION Right 03/27/2014   Procedure: CHEST TUBE INSERTION;  Surgeon: [place] KATHEE Jude, MD;  Location: [facility] OR;  Service: Thoracic;  Laterality: Right;   COLONOSCOPY WITH PROPOFOL  N/A 10/24/2021   Surgeon: Cindie Carlin MARLA, DO; Non bleeding internal hemorrhoids, one 4 mm sessile serrated adenoma. Random colon biopsies benign. Repeat in 5 years.   CYSTOSCOPY N/A 11/26/2017   Procedure: CYSTOSCOPY;  Surgeon: Delana Ted Morrison, DO;  Location: [facility] ORS;  Service: Gynecology;  Laterality: N/A;   ESOPHAGOGASTRODUODENOSCOPY (EGD) WITH PROPOFOL  N/A 10/24/2021   Surgeon: Cindie Carlin MARLA, DO;   Grade 1  esophageal varices, portal hypertensive gastropathy, gastritis biopsied. Pathology with minimal chronic gastritis   INTRAOPERATIVE TRANSESOPHAGEAL ECHOCARDIOGRAM N/A 02/14/2014   Procedure: INTRAOPERATIVE TRANSESOPHAGEAL ECHOCARDIOGRAM;  Surgeon: Dorise MARLA Fellers, MD;  Location[facility] OR;  Service: Open Heart Surgery;  Laterality: N/A;   LEFT AND RIGHT HEART CATHETERIZATION WITH CORONARY ANGIOGRAM N/A 01/25/2014   Procedure: LEFT AND RIGHT HEART CATHETERIZATION WITH CORONARY ANGIOGRAM;  Surgeon: Emberlie Gotcher M Anzley Dibbern, MD;  Location: [facility] CATH LAB;   Service: Cardiovascular;  Laterality: N/A;   PERICARDIAL FLUID DRAINAGE N/A 03/27/2014   Procedure: DRAINAGE OF PERICARDIAL FLUID;  Surgeon: [place] KATHEE Jude, MD;  Location: [facility] OR;  Service: Thoracic;  Laterality: N/A;   PLEURAL EFFUSION DRAINAGE Right 03/27/2014   Procedure: DRAINAGE OF PLEURAL EFFUSION;  Surgeon: [place] KATHEE Jude, MD;  Location: [facility] OR;  Service: Thoracic;  Laterality: Right;  Drainage of right pleural effusion   POLYPECTOMY  10/24/2021   Procedure: POLYPECTOMY INTESTINAL;  Surgeon: Cindie Carlin MARLA, DO;  [facility];  Service: Endoscopy;;   SUBXYPHOID PERICARDIAL WINDOW N/A 03/27/2014   Procedure: SUBXYPHOID PERICARDIAL WINDOW;  Surgeon: [place] KATHEE Jude, MD;  Location: [facility] OR;  Service: Thoracic;  Laterality: N/A;   TOTAL LAPAROSCOPIC HYSTERECTOMY WITH SALPINGECTOMY Bilateral 11/26/2017   Procedure: TOTAL LAPAROSCOPIC HYSTERECTOMY WITH BILATERAL SALPINGECTOMY LEFT OOPHERECTOMY;  Surgeon: Delana Ted Morrison, DO;  Location: [facility] ORS;  Service: Gynecology;  Laterality: Bilateral;   TUBAL LIGATION       Medications: Current Outpatient Medications  Medication Sig Dispense Refill   amoxicillin (AMOXIL) 500 MG capsule Take 1,000 mg by mouth 2 (two) times daily.     Continuous Glucose Sensor (DEXCOM G7 SENSOR) MISC INJECT 1 APPLICATION INTO THE SKIN AS DIRECTED. CHANGE SENSOR EVERY 10 DAYS AS DIRECTED. 9 each 3   dicyclomine  (BENTYL ) 10 MG capsule TAKE 1 CAPSULE BY MOUTH 3 TIMES A DAY BEFORE MEALS AS NEEDED FOR LOOSE STOOLS/ABDOMINAL CRAMPING 270 capsule 3   metFORMIN  (GLUCOPHAGE -XR) 500 MG 24 hr tablet Take 1 tablet (500 mg total) by mouth 2 (two) times daily. 180 tablet 1   nadolol  (CORGARD ) 20 MG tablet TAKE 1 TABLET BY MOUTH EVERY DAY 90 tablet 0   NOVOLOG  MIX 70/30 FLEXPEN (70-30) 100 UNIT/ML FlexPen Inject 30-60 Units into the skin See admin instructions. 60 units in the morning. 30 units at night.     rosuvastatin  (CRESTOR ) 10 MG tablet Take 1 tablet (10 mg  total) by mouth daily. (Patient taking differently: Take 5 mg by mouth at bedtime.) 90 tablet 3   warfarin (COUMADIN ) 5 MG tablet Take 0.5-1 tablets (2.5-5 mg total) by mouth See admin instructions. Take 2.5 mg by mouth daily except take 5 mg by mouth on Monday, Wednesday and Friday (Patient taking differently: Take 2.5-5 mg by mouth See admin instructions. Take 5 mg  on Tuesday and Thursday, all the other days take 2.5 mg  in the evening) 30 tablet 5   No current facility-administered medications for this visit.    Allergies: Allergies  Allergen Reactions   Glipizide Nausea And Vomiting and Other (See Comments)    GI upset   Metoprolol  Other (See Comments)    Made me sick   Shellfish Allergy Swelling    Throat and eyes were swollen.  Had difficulty breathing.  Was hospitalized.      Social History: The patient  reports that she has never smoked. She has never used smokeless tobacco. She reports that she does not drink alcohol and does not use drugs.  Family History: The patient's family history includes Brain cancer in her brother; Cancer - Colon in her maternal grandmother; Colon cancer in her maternal grandmother; Crohn's disease in her brother; Diabetes in her mother; Heart disease in her mother.   Review of Systems: Please see the history of present illness.  Glucose has been elevated.  All other systems are reviewed and negative.   Physical Exam: VS:  BP 132/74   Pulse (!) 58   Ht 5' 9 (1.753 m)   Wt 218 lb 12.8 oz (99.2 kg)   LMP 04/14/2017   SpO2 97%   BMI 32.31 kg/m  .  BMI Body mass index is 32.31 kg/m.  Wt Readings from Last 3 Encounters:  01/28/23 218 lb 12.8 oz (99.2 kg)  12/11/22 215 lb 3.2 oz (97.6 kg)  09/18/22 222 lb (100.7 kg)   GENERAL:  Well appearing obese WF in NAD HEENT:  PERRL, EOMI, sclera are clear. Oropharynx is clear. NECK:  No jugular venous distention, carotid upstroke brisk and symmetric, no bruits, no thyromegaly or adenopathy LUNGS:   Clear to auscultation bilaterally CHEST:  Unremarkable HEART:  RRR,  PMI not displaced or sustained,good mechanical S2. , no S3, no S4:  no rubs ABD:  Soft, nontender. BS +, no masses or bruits. No hepatomegaly, no splenomegaly EXT:  2 + pulses throughout, no edema, no cyanosis no clubbing SKIN:  Warm and dry. Chronic splotchy brown discoloration below knees.  NEURO:  Alert and oriented x 3. Cranial nerves II through XII intact. PSYCH:  Cognitively intact    LABORATORY DATA:  EKG:  EKG is ordered today. NSR rate 70.  Minor nonspecific ST abnormality.  I have personally reviewed and interpreted this study.   Lab Results  Component Value Date   WBC 3.4 (L) 09/20/2022   HGB 12.8 09/20/2022   HCT 39.3 09/20/2022   PLT 50 (L) 09/20/2022   GLUCOSE 414 (H) 09/20/2022   CHOL 102 06/01/2018   TRIG 110 06/17/2022   HDL 32 (L) 06/01/2018   LDLCALC 57 06/17/2022   ALT 40 09/20/2022   AST 35 09/20/2022   NA 133 (L) 09/20/2022   K 3.6 09/20/2022   CL 101 09/20/2022   CREATININE 0.63 09/20/2022   BUN 10 09/20/2022   CO2 26 09/20/2022   TSH 1.44 03/08/2022   INR 2.9 (H) 09/27/2021   HGBA1C 11.1 06/17/2022   MICROALBUR negative 04/14/2014   Dated 11/10/18: A1c 9.1%, CBC and CMET normal.  Cholesterol 97, triglycerides 98, HDL 33, LDL 47. Dated 08/06/19: cholesterol 111, triglycerides 100, HDL 30, LDL 62.  Dated 01/06/20: A1c 8.2%. CMET normal. Dated 04/18/21: cholesterol 119, triglycerides 30, HDL 33, LDL 77. A1c 12.5%. CMET and TSH normal. Dated 08/01/21: normal LFTs, TSH. A1c 11.8%.  Dated 12/25/22: A1c 11.8%.   BNP (last 3 results) No results for input(s): BNP in the last 8760 hours.  ProBNP (last 3 results) No results for input(s): PROBNP in the last 8760 hours.   Other Studies Reviewed Today:   Echo: 03/26/2014 Study Conclusions - Left ventricle: The cavity size was normal. Wall thickness was   normal. Systolic function was vigorous. The estimated ejection   fraction  was in the range of 65% to 70%. Wall motion was normal;   there were no regional wall motion abnormalities. - Aortic valve: A [employer] mechanical prosthesis was   present. No obvious perivalvular leak based on limited views.   There was no significant regurgitation. Mean gradient (S): 15 mm  Hg. Peak gradient (S): 28 mm Hg. - Right ventricle: The cavity size was below normal. - Tricuspid valve: There was trivial regurgitation. - Pulmonary arteries: Systolic pressure could not be accurately   estimated. - Pericardium, extracardiac: A large pericardial effusion was   identified circumferential to the heart, measures approximately 4   cm posteriorly and 3 cm anteriorly. There is compression of the   right ventricle noted in the subcostal view and further evidence   of tamponade physiology based on respiratory variation in mitral   inflow. There was a right pleural effusion. Impressions: - Normal LV wall thickness with LVEF 65-70%. [employer] Jude mechanical   AVR without obvious perivalvular leak based on limited and with   grossly normal gradients. A large pericardial effusion was   identified circumferential to the heart, measures approximately 4   cm posteriorly and 3-4 cm anteriorly. There is compression of the   right ventricle noted in the subcostal view and further evidence   of tamponade physiology based on respiratory variation in mitral   inflow. Right pleural effusion also noted. Results discussed with   Dr. Army.  Echo 06/19/17: Study Conclusions   - Left ventricle: The cavity size was normal. Systolic function was   normal. The estimated ejection fraction was in the range of 60%   to 65%. Wall motion was normal; there were no regional wall   motion abnormalities. Left ventricular diastolic function   parameters were normal. - Aortic valve: There was very mild stenosis. - Right ventricle: The cavity size was normal. Wall thickness was   normal. Systolic function was  normal. - Right atrium: The atrium was normal in size. - Tricuspid valve: Structurally normal valve. There was mild   regurgitation. - Pulmonary arteries: The main pulmonary artery was normal-sized.   Systolic pressure was within the normal range. - Inferior vena cava: The vessel was normal in size. - Pericardium, extracardiac: There was no pericardial effusion.   Impressions:   - S/P AVR with a mechanical valve. Leaflets are not well   visualized. Transaortic gradients are slightly higher than on a   prior study from 04/15/2014 now peak/mean 38/19 mmHg, peak velocity   3.1 m/s, but still within upper normal range for this type of   prosthesis.  CT abdomen/pelvis: 09/01/21: IMPRESSION: 1. No acute intra-abdominal or pelvic pathology. 2. Decompensated cirrhosis with splenomegaly and trace ascites. 3. Aortic Atherosclerosis (ICD10-I70.0).      Assessment/Plan: 1. S/p AVR in 2016 with mechanical prosthesis. Echo in 2019 showed normal prosthetic valve function. Exam is normal today and Ecg looks good. Continue long term coumadin . Reviewed recommendations for SBE prophylaxis. Thrombocytopenia is a concern with her being on Coumadin  but this appears to likely be related to splenic sequestration. Will continue coumadin  for now.   2. DM-  Per primary care. On insulin . Reports last A1c 11.8%. poor control. She has lost weight but if doesn't improve may need endocrinology evaluation.    3. Chronic coumadin  therapy. INR monitored by primary care.   4. Thrombocytopenia. Work up per hematology. Recent CT suggests this is related to splenic sequestration.   5. Cirrhosis. Noted on CT. Etiology is unclear. No history of Etoh. Consider hepatitis (prior transfusions), ? NASH. Further work up per hematology and PCP. No significant edema on exam.    Disposition:   FU with me in one year  Signed: Jalien Weakland MD, FACC    01/28/2023 10:05 AM

## 2023-01-28 ENCOUNTER — Encounter: Payer: Self-pay | Admitting: Cardiology

## 2023-01-28 ENCOUNTER — Ambulatory Visit: Payer: BC Managed Care – PPO | Attending: Cardiology | Admitting: Cardiology

## 2023-01-28 VITALS — BP 132/74 | HR 58 | Ht 69.0 in | Wt 218.8 lb

## 2023-01-28 DIAGNOSIS — I10 Essential (primary) hypertension: Secondary | ICD-10-CM

## 2023-01-28 DIAGNOSIS — R011 Cardiac murmur, unspecified: Secondary | ICD-10-CM | POA: Diagnosis not present

## 2023-01-28 DIAGNOSIS — Z952 Presence of prosthetic heart valve: Secondary | ICD-10-CM | POA: Diagnosis not present

## 2023-01-28 NOTE — Patient Instructions (Signed)
 Medication Instructions:  NO changes *If you need a refill on your cardiac medications before your next appointment, please call your pharmacy*     Follow-Up: At [facility], you and your health needs are our priority.  As part of our continuing mission to provide you with exceptional heart care, we have created designated Provider Care Teams.  These Care Teams include your primary Cardiologist (physician) and Advanced Practice Providers (APPs -  Physician Assistants and Nurse Practitioners) who all work together to provide you with the care you need, when you need it.   Your next appointment:   1 year(s)  Provider:   Peter Jordan, MD

## 2023-03-11 ENCOUNTER — Encounter: Payer: Self-pay | Admitting: Gastroenterology

## 2023-03-17 ENCOUNTER — Inpatient Hospital Stay: Payer: BC Managed Care – PPO | Attending: Hematology

## 2023-03-17 DIAGNOSIS — R161 Splenomegaly, not elsewhere classified: Secondary | ICD-10-CM | POA: Insufficient documentation

## 2023-03-17 DIAGNOSIS — E611 Iron deficiency: Secondary | ICD-10-CM | POA: Diagnosis present

## 2023-03-17 DIAGNOSIS — D696 Thrombocytopenia, unspecified: Secondary | ICD-10-CM

## 2023-03-17 DIAGNOSIS — D6959 Other secondary thrombocytopenia: Secondary | ICD-10-CM | POA: Insufficient documentation

## 2023-03-17 DIAGNOSIS — K7460 Unspecified cirrhosis of liver: Secondary | ICD-10-CM | POA: Diagnosis not present

## 2023-03-17 LAB — CBC WITH DIFFERENTIAL/PLATELET
Abs Immature Granulocytes: 0.01 10*3/uL (ref 0.00–0.07)
Basophils Absolute: 0 10*3/uL (ref 0.0–0.1)
Basophils Relative: 1 %
Eosinophils Absolute: 0.1 10*3/uL (ref 0.0–0.5)
Eosinophils Relative: 3 %
HCT: 37.6 % (ref 36.0–46.0)
Hemoglobin: 12.4 g/dL (ref 12.0–15.0)
Immature Granulocytes: 0 %
Lymphocytes Relative: 26 %
Lymphs Abs: 0.7 10*3/uL (ref 0.7–4.0)
MCH: 29.6 pg (ref 26.0–34.0)
MCHC: 33 g/dL (ref 30.0–36.0)
MCV: 89.7 fL (ref 80.0–100.0)
Monocytes Absolute: 0.2 10*3/uL (ref 0.1–1.0)
Monocytes Relative: 8 %
Neutro Abs: 1.7 10*3/uL (ref 1.7–7.7)
Neutrophils Relative %: 62 %
Platelets: 49 10*3/uL — ABNORMAL LOW (ref 150–400)
RBC: 4.19 MIL/uL (ref 3.87–5.11)
RDW: 13.7 % (ref 11.5–15.5)
WBC: 2.8 10*3/uL — ABNORMAL LOW (ref 4.0–10.5)
nRBC: 0 % (ref 0.0–0.2)

## 2023-03-17 LAB — COMPREHENSIVE METABOLIC PANEL
ALT: 31 U/L (ref 0–44)
AST: 30 U/L (ref 15–41)
Albumin: 3.5 g/dL (ref 3.5–5.0)
Alkaline Phosphatase: 67 U/L (ref 38–126)
Anion gap: 10 (ref 5–15)
BUN: 11 mg/dL (ref 6–20)
CO2: 25 mmol/L (ref 22–32)
Calcium: 8.8 mg/dL — ABNORMAL LOW (ref 8.9–10.3)
Chloride: 97 mmol/L — ABNORMAL LOW (ref 98–111)
Creatinine, Ser: 0.67 mg/dL (ref 0.44–1.00)
GFR, Estimated: >60 mL/min (ref >60)
Glucose, Bld: 469 mg/dL — ABNORMAL HIGH (ref 70–99)
Potassium: 3.8 mmol/L (ref 3.5–5.1)
Sodium: 132 mmol/L — ABNORMAL LOW (ref 135–145)
Total Bilirubin: 1.2 mg/dL (ref 0.0–1.2)
Total Protein: 6.3 g/dL — ABNORMAL LOW (ref 6.5–8.1)

## 2023-03-17 LAB — IRON AND TIBC
Iron: 44 ug/dL (ref 28–170)
Saturation Ratios: 12 % (ref 10.4–31.8)
TIBC: 365 ug/dL (ref 250–450)
UIBC: 321 ug/dL

## 2023-03-17 LAB — FERRITIN: Ferritin: 62 ng/mL (ref 11–307)

## 2023-03-19 ENCOUNTER — Ambulatory Visit: Payer: BC Managed Care – PPO | Admitting: Nurse Practitioner

## 2023-03-21 NOTE — Progress Notes (Signed)
 [facility] [address], [place] [address]   CLINIC:  Medical Oncology/Hematology  PCP:  Juliette Alcide, MD [address] [place] [address] [phone]   REASON FOR VISIT: Thrombocytopenia (cirrhosis/splenomegaly) + iron deficiency anemia   CURRENT THERAPY: Intermittent IV iron  INTERVAL HISTORY:   Anna Salinas 53 y.o. female returns for routine follow-up of thrombocytopenia and iron deficiency anemia.  She was last evaluated via telemedicine visit by Rojelio Brenner PA-C on 09/23/2022.     At today's visit, she reports feeling fair.  She denies any surgeries, hospitalizations, or major changes in her baseline health status since her last visit.  Her overall energy remains low at about 50%.  IV iron was recommended after last visit, but she was unable to complete as recommended due to interfering external circumstances.  She continues to take Coumadin (history of mechanical aortic valve replacement).  She reports occasional gum bleeding when brushing her teeth.  She denies any other abnormal bleeding - no epistaxis, hematuria, hematochezia, melena.  She reports easy bruising in her extremities, but denies any large spontaneous bruises.  She continues to have scattered petechiae and brownish hyperpigmentation of her lower extremities which is worse after she has spent her 8-hour shift at work on her feet.  She denies any fever, chills, or night sweats.  She has not noticed any new lumps or bumps.  She is taking iron tablet daily.  She reports 50% energy and 70% appetite. She is maintaining a stable weight.  ASSESSMENT & PLAN:  1.  Thrombocytopenia, secondary to splenic sequestration (liver cirrhosis/splenomegaly) - Mild to moderate thrombocytopenia since 2016, when she had her AVR with mechanical valve, with worsening platelet counts over the past 2+ years. - Nutritional deficiency work-up, hepatitis panel was negative.  ANA was positive with speckled pattern  with elevation of 2320.  She does not have any clinical signs or symptoms of lupus. - No improvement in platelets after trial of Decadron 40 mg x 4 days  - CT abdomen/pelvis (09/01/2021): Decompensated liver cirrhosis with trace ascites and with splenomegaly (15 cm) - Following with [facility] for liver cirrhosis diagnosed in September 2023 - She is on Coumadin due to goal AVR.  She admits to easy bruising, but denies any major bleeding events. - She does exhibit petechial rash and hyperpigmentation of lower extremities (as of visit on 10/06/2020), suspected to be Schamberg's disease rather than due to thrombocytopenia, further discussed below  - Most recent labs (03/17/2023): Platelets 49, WBC 2.8 with normal differential. - PLAN: Repeat CBC and RTC in 6 months  - Due to platelet count < 50 K, recommend cautiously continuing Coumadin (Hx mechanical AVR).  If she has evidence of bleeding or platelets <30, we would consider starting avatrombopag.  Patient should seek immediate medical attention if any evidence of bleeding, and use extreme caution to avoid injuries that could cause blood loss.  We will check CBC/D every 2 months.   - Patient is aware of alarm symptoms that would prompt immediate medical attention.   2.  Iron deficiency without anemia - She has a history of microcytic anemia, which has resolved.  Hemoglobin on 08/09/2021 is normal at 14.3/MCV 85.1 - She was previously noted (09/22/2020) to have mild iron deficiency with ferritin 25 and iron saturation 9% -- She had blood transfusions in 2019 due to anemia from menorrhagia.  She is now s/p hysterectomy (April 2019). - EGD (10/24/2021): Small esophageal varices, portal hypertensive gastropathy,  and gastritis. - Colonoscopy (10/24/2021): Non-bleeding internal hemorrhoids and polyp x 1. - She takes daily iron supplement, but required IV Venofer 300 mg x 3 in September/October 2023 - Recommended IV iron in September  2024, but unable to complete at that time due to unforeseen circumstances. - Most recent labs (03/17/2023): Hgb 12.4/MCV 89.7, ferritin 62, iron saturation 12% - No rectal bleeding or melena. - Reports baseline fatigue - Per GI, portal hypertensive gastropathy likely etiology of IDA.   - PLAN: Recommend IV Venofer 300 mg x 2 - Continue oral iron supplement.  We will recheck iron panel and CBC in 6 months    3.  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  No peripheral edema.  Nonpruritic/nonpainful. - Lesions appear similar in characteristic to the pattern of Schamberg's disease (a.k.a. " progressive pigmented purpura") - Little suspicion that petechial rash is related to thrombocytopenia, as patient had the skin lesions even when her platelets were > 100 - PLAN: Recommended to patient that she wear compression stockings during her work shifts to see if this alleviates her lesions   - Patient declined dermatology referral - Patient informed that she should notify us immediately if she has any worsening petechiae or if petechiae spread to other areas of the body   PLAN SUMMARY:  >> Venofer 300 mg x 2 >> CBC/D every 2 months >> Labs in 6 months = CBC/D, CMP, ferritin, iron/TIBC >> OFFICE visit in 6 months      REVIEW OF SYSTEMS:   Review of Systems  Constitutional:  Positive for fatigue. Negative for appetite change, chills, diaphoresis, fever and unexpected weight change.  HENT:   Negative for lump/mass and nosebleeds.   Eyes:  Negative for eye problems.  Respiratory:  Negative for cough, hemoptysis and shortness of breath.   Cardiovascular:  Positive for chest pain (at times) and palpitations. Negative  for leg swelling.  Gastrointestinal:  Negative for abdominal pain, blood in stool, constipation, diarrhea, nausea and vomiting.  Genitourinary:  Negative for hematuria.   Skin: Negative.   Neurological:  Positive for dizziness and numbness. Negative for headaches and light-headedness.  Hematological:  Does not bruise/bleed easily.  Psychiatric/Behavioral:  Positive for depression and sleep disturbance. The patient is nervous/anxious.      PHYSICAL EXAM:  ECOG PERFORMANCE STATUS: 1 - Symptomatic but completely ambulatory  Vitals:   03/24/23 1322  BP: 119/76  Pulse: 69  Resp: 18  Temp: 98.2 F (36.8 C)  SpO2: 97%   Filed Weights   03/24/23 1322  Weight: 214 lb (97.1 kg)   Physical Exam Constitutional:      Appearance: Normal appearance. She is obese.  Cardiovascular:     Rate and Rhythm: Normal rate and regular rhythm.     Comments: Mechanical click Pulmonary:     Breath sounds: Normal breath sounds.  Neurological:     General: No focal deficit present.     Mental Status: Mental status is at baseline.  Psychiatric:        Behavior: Behavior normal. Behavior is cooperative.     PAST MEDICAL/SURGICAL HISTORY:  Past Medical History:  Diagnosis Date   Aortic stenosis due to bicuspid aortic valve    Blood transfusion without reported diagnosis  Essential hypertension, benign    Iron deficiency 09/07/2021   Palpitations    S/P laparoscopic hysterectomy 11/26/2017   Type 2 diabetes mellitus [facility])    Past Surgical History:  Procedure Laterality Date   ABDOMINAL HYSTERECTOMY     AORTIC VALVE REPLACEMENT N/A 02/14/2014   Procedure: AORTIC VALVE REPLACEMENT (AVR);  Surgeon: Alleen Borne, MD;  Location: [facility] OR;  Service: Open Heart Surgery;  Laterality: N/A;   BIOPSY  10/24/2021   Procedure: BIOPSY;  Surgeon: Lanelle Bal, DO;  Location: [facility] SUITE;  Service: Endoscopy;;   CARDIAC CATHETERIZATION     CESAREAN SECTION  01/15/2007   CHEST TUBE INSERTION Right  03/27/2014   Procedure: CHEST TUBE INSERTION;  Surgeon: Delight Ovens, MD;  Location: [facility] OR;  Service: Thoracic;  Laterality: Right;   COLONOSCOPY WITH PROPOFOL N/A 10/24/2021   Surgeon: Lanelle Bal, DO; Non bleeding internal hemorrhoids, one 4 mm sessile serrated adenoma. Random colon biopsies benign. Repeat in 5 years.   CYSTOSCOPY N/A 11/26/2017   Procedure: CYSTOSCOPY;  Surgeon: Edwinna Areola, DO;  Location: [facility] ORS;  Service: Gynecology;  Laterality: N/A;   ESOPHAGOGASTRODUODENOSCOPY (EGD) WITH PROPOFOL N/A 10/24/2021   Surgeon: Lanelle Bal, DO;   Grade 1 esophageal varices, portal hypertensive gastropathy, gastritis biopsied. Pathology with minimal chronic gastritis   INTRAOPERATIVE TRANSESOPHAGEAL ECHOCARDIOGRAM N/A 02/14/2014   Procedure: INTRAOPERATIVE TRANSESOPHAGEAL ECHOCARDIOGRAM;  Surgeon: Alleen Borne, MD;  Location: [facility] OR;  Service: Open Heart Surgery;  Laterality: N/A;   LEFT AND RIGHT HEART CATHETERIZATION WITH CORONARY ANGIOGRAM N/A 01/25/2014   Procedure: LEFT AND RIGHT HEART CATHETERIZATION WITH CORONARY ANGIOGRAM;  Surgeon: Peter M Swaziland, MD;  Location: [facility] CATH LAB;  Service: Cardiovascular;  Laterality: N/A;   PERICARDIAL FLUID DRAINAGE N/A 03/27/2014   Procedure: DRAINAGE OF PERICARDIAL FLUID;  Surgeon: Delight Ovens, MD;  Location: [facility] OR;  Service: Thoracic;  Laterality: N/A;   PLEURAL EFFUSION DRAINAGE Right 03/27/2014   Procedure: DRAINAGE OF PLEURAL EFFUSION;  Surgeon: Delight Ovens, MD;  Location: [facility] OR;  Service: Thoracic;  Laterality: Right;  Drainage of right pleural effusion   POLYPECTOMY  10/24/2021   Procedure: POLYPECTOMY INTESTINAL;  Surgeon: Lanelle Bal, DO;  Location: [facility];  Service: Endoscopy;;   SUBXYPHOID PERICARDIAL WINDOW N/A 03/27/2014   Procedure: SUBXYPHOID PERICARDIAL WINDOW;  Surgeon: Delight Ovens, MD;  Location: [facility] OR;  Service: Thoracic;  Laterality: N/A;   TOTAL LAPAROSCOPIC HYSTERECTOMY WITH  SALPINGECTOMY Bilateral 11/26/2017   Procedure: TOTAL LAPAROSCOPIC HYSTERECTOMY WITH BILATERAL SALPINGECTOMY LEFT OOPHERECTOMY;  Surgeon: Edwinna Areola, DO;  Location: [facility] ORS;  Service: Gynecology;  Laterality: Bilateral;   TUBAL LIGATION      SOCIAL HISTORY:  Social History   Socioeconomic History   Marital status: Married    Spouse name: Not on file   Number of children: 3   Years of education: Not on file   Highest education level: Not on file  Occupational History   Occupation: Lobbyist: [employer]  Tobacco Use   Smoking status: Never   Smokeless tobacco: Never  Vaping Use   Vaping status: Never Used  Substance and Sexual Activity   Alcohol use: No    Alcohol/week: 0.0 standard drinks of alcohol   Drug use: No   Sexual activity: Not on file  Other Topics Concern   Not on file  Social History Narrative   Not on file   Social Drivers of Health  Financial Resource Strain: Not on file  Food Insecurity: Not on file  Transportation Needs: Not on file  Physical Activity: Not on file  Stress: Not on file  Social Connections: Not on file  Intimate Partner Violence: Not on file    FAMILY HISTORY:  Family History  Problem Relation Age of Onset   Diabetes Mother    Heart disease Mother    Brain cancer Brother    Crohn's disease Brother    Colon cancer Maternal Grandmother    Cancer - Colon Maternal Grandmother        90s    CURRENT MEDICATIONS:  Outpatient Encounter Medications as of 03/24/2023  Medication Sig   amoxicillin (AMOXIL) 500 MG capsule Take 1,000 mg by mouth 2 (two) times daily.   carvedilol (COREG) 3.125 MG tablet Take by mouth.   Continuous Glucose Sensor (DEXCOM G7 SENSOR) MISC INJECT 1 APPLICATION INTO THE SKIN AS DIRECTED. CHANGE SENSOR EVERY 10 DAYS AS DIRECTED.   dicyclomine (BENTYL) 10 MG capsule TAKE 1 CAPSULE BY MOUTH 3 TIMES A DAY BEFORE MEALS AS NEEDED FOR LOOSE STOOLS/ABDOMINAL CRAMPING   metFORMIN  (GLUCOPHAGE-XR) 500 MG 24 hr tablet Take 1 tablet (500 mg total) by mouth 2 (two) times daily.   nadolol (CORGARD) 20 MG tablet TAKE 1 TABLET BY MOUTH EVERY DAY   NOVOLOG MIX 70/30 FLEXPEN (70-30) 100 UNIT/ML FlexPen Inject 30-60 Units into the skin See admin instructions. 60 units in the morning. 30 units at night.   rosuvastatin (CRESTOR) 10 MG tablet Take 1 tablet (10 mg total) by mouth daily. (Patient taking differently: Take 5 mg by mouth at bedtime.)   warfarin (COUMADIN) 5 MG tablet Take 0.5-1 tablets (2.5-5 mg total) by mouth See admin instructions. Take 2.5 mg by mouth daily except take 5 mg by mouth on Monday, Wednesday and Friday (Patient taking differently: Take 2.5-5 mg by mouth See admin instructions. Take 5 mg  on Tuesday and Thursday, all the other days take 2.5 mg  in the evening)   No facility-administered encounter medications on file as of 03/24/2023.    ALLERGIES:  Allergies  Allergen Reactions   Glipizide Nausea And Vomiting and Other (See Comments)    GI upset   Metoprolol Other (See Comments)    "Made me sick"   Shellfish Allergy Swelling    Throat and eyes were swollen.  Had difficulty breathing.  Was hospitalized.      LABORATORY DATA:  I have reviewed the labs as listed.  CBC    Component Value Date/Time   WBC 2.8 (L) 03/17/2023 1347   RBC 4.19 03/17/2023 1347   HGB 12.4 03/17/2023 1347   HGB 11.9 02/26/2018 1544   HCT 37.6 03/17/2023 1347   HCT 37.1 02/26/2018 1544   PLT 49 (L) 03/17/2023 1347   PLT 151 02/26/2018 1544   MCV 89.7 03/17/2023 1347   MCV 78 (L) 02/26/2018 1544   MCH 29.6 03/17/2023 1347   MCHC 33.0 03/17/2023 1347   RDW 13.7 03/17/2023 1347   RDW 16.0 (H) 02/26/2018 1544   LYMPHSABS 0.7 03/17/2023 1347   LYMPHSABS 1.8 02/26/2018 1544   MONOABS 0.2 03/17/2023 1347   EOSABS 0.1 03/17/2023 1347   EOSABS 0.2 02/26/2018 1544   BASOSABS 0.0 03/17/2023 1347   BASOSABS 0.1 02/26/2018 1544      Latest Ref Rng & Units 03/17/2023    1:47 PM  09/20/2022   12:45 PM 03/08/2022   11:19 AM  CMP  Glucose 70 - 99 mg/dL  469  414  241   BUN 6 - 20 mg/dL 11  10  11    Creatinine 0.44 - 1.00 mg/dL 5.78  4.69  6.29   Sodium 135 - 145 mmol/L 132  133  133   Potassium 3.5 - 5.1 mmol/L 3.8  3.6  3.6   Chloride 98 - 111 mmol/L 97  101  102   CO2 22 - 32 mmol/L 25  26  25    Calcium 8.9 - 10.3 mg/dL 8.8  8.2  8.7   Total Protein 6.5 - 8.1 g/dL 6.3  6.6  6.9   Total Bilirubin 0.0 - 1.2 mg/dL 1.2  1.6  1.3   Alkaline Phos 38 - 126 U/L 67  78  70   AST 15 - 41 U/L 30  35  35   ALT 0 - 44 U/L 31  40  35     DIAGNOSTIC IMAGING:  I have independently reviewed the relevant imaging and discussed with the patient.   WRAP UP:  All questions were answered. The patient knows to call the clinic with any problems, questions or concerns.  Medical decision making: Moderate  Time spent on visit: I spent 20 minutes counseling the patient face to face. The total time spent in the appointment was 30 minutes and more than 50% was on counseling.  Carnella Guadalajara, PA-C  03/24/23 2:03 PM

## 2023-03-24 ENCOUNTER — Inpatient Hospital Stay (HOSPITAL_BASED_OUTPATIENT_CLINIC_OR_DEPARTMENT_OTHER): Payer: BC Managed Care – PPO | Admitting: Physician Assistant

## 2023-03-24 VITALS — BP 119/76 | HR 69 | Temp 98.2°F | Resp 18 | Ht 69.0 in | Wt 214.0 lb

## 2023-03-24 DIAGNOSIS — D696 Thrombocytopenia, unspecified: Secondary | ICD-10-CM | POA: Diagnosis not present

## 2023-03-24 DIAGNOSIS — E611 Iron deficiency: Secondary | ICD-10-CM

## 2023-03-24 DIAGNOSIS — D6959 Other secondary thrombocytopenia: Secondary | ICD-10-CM | POA: Diagnosis not present

## 2023-03-24 NOTE — Patient Instructions (Signed)
 [facility] at [facility] **VISIT SUMMARY & IMPORTANT INSTRUCTIONS **   You were seen today by Rojelio Brenner PA-C for your low platelets and iron deficiency.    LOW PLATELETS Your low platelets are related to your cirrhosis and enlarged spleen. Your platelets are slightly lower than what they have been. Since your platelets are lower than 50, please use EXTREME CAUTION to avoid any injury that could cause bleeding. Seek IMMEDIATE medical attention if you notice any abnormal bleeding or severe spontaneous bruising. We will keep a closer eye on your blood counts due to your worsening platelet levels.  Will check your blood every 2 months. If your platelet levels drop to less than 30, we would consider starting you on a medication to improve your platelet counts.  LOW IRON Your iron levels are low, which could be causing some of your fatigue. We will schedule you for IV iron x 2. Continue to take iron tablet daily.  FOLLOW-UP APPOINTMENT: 6 months  ** Thank you for trusting me with your healthcare!  I strive to provide all of my patients with quality care at each visit.  If you receive a survey for this visit, I would be so grateful to you for taking the time to provide feedback.  Thank you in advance!  ~ Beila Purdie                   Dr. Doreatha Massed   &   Rojelio Brenner, PA-C   - - - - - - - - - - - - - - - - - -    Thank you for choosing [facility] at [facility] to provide your oncology and hematology care.  To afford each patient quality time with our provider, please arrive at least 15 minutes before your scheduled appointment time.   If you have a lab appointment with the Cancer Center please come in thru the Main Entrance and check in at the main information desk.  You need to re-schedule your appointment should you arrive 10 or more minutes late.  We strive to give you quality time with our providers, and arriving late  affects you and other patients whose appointments are after yours.  Also, if you no show three or more times for appointments you may be dismissed from the clinic at the providers discretion.     Again, thank you for choosing [facility].  Our hope is that these requests will decrease the amount of time that you wait before being seen by our physicians.       _____________________________________________________________  Should you have questions after your visit to [facility], please contact our office at [phone] and follow the prompts.  Our office hours are 8:00 a.m. and 4:30 p.m. Monday - Friday.  Please note that voicemails left after 4:00 p.m. may not be returned until the following business day.  We are closed weekends and major holidays.  You do have access to a nurse 24-7, just call the main number to the clinic [phone] and do not press any options, hold on the line and a nurse will answer the phone.    For prescription refill requests, have your pharmacy contact our office and allow 72 hours.

## 2023-04-02 ENCOUNTER — Inpatient Hospital Stay

## 2023-04-09 ENCOUNTER — Inpatient Hospital Stay

## 2023-04-09 VITALS — BP 123/63 | HR 76 | Temp 97.3°F | Resp 16

## 2023-04-09 DIAGNOSIS — E611 Iron deficiency: Secondary | ICD-10-CM

## 2023-04-09 DIAGNOSIS — D6959 Other secondary thrombocytopenia: Secondary | ICD-10-CM | POA: Diagnosis not present

## 2023-04-09 MED ORDER — IRON SUCROSE 20 MG/ML IV SOLN
300.0000 mg | Freq: Once | INTRAVENOUS | Status: AC
  Administered 2023-04-09: 300 mg via INTRAVENOUS
  Filled 2023-04-09: qty 300

## 2023-04-09 MED ORDER — SODIUM CHLORIDE 0.9 % IV SOLN: Freq: Once | INTRAVENOUS | Status: AC

## 2023-04-09 MED ORDER — CETIRIZINE HCL 10 MG PO TABS
10.0000 mg | ORAL_TABLET | Freq: Once | ORAL | Status: AC
  Administered 2023-04-09: 10 mg via ORAL
  Filled 2023-04-09: qty 1

## 2023-04-09 NOTE — Progress Notes (Signed)
 Patient presents today for Venofer infusion per providers order.  Vital signs WNL.  Patient has no new complaints at this time.  Peripheral IV started and blood return noted pre and post infusion.  Stable during infusion without adverse affects.  Vital signs stable.  No complaints at this time.  Discharge from clinic ambulatory in stable condition.  Alert and oriented X 3.  Follow up with [facility] as scheduled.

## 2023-04-09 NOTE — Patient Instructions (Signed)

## 2023-04-16 ENCOUNTER — Inpatient Hospital Stay: Attending: Hematology

## 2023-04-16 ENCOUNTER — Inpatient Hospital Stay

## 2023-04-16 VITALS — BP 133/73 | HR 64 | Temp 96.6°F | Resp 18

## 2023-04-16 DIAGNOSIS — D509 Iron deficiency anemia, unspecified: Secondary | ICD-10-CM | POA: Diagnosis present

## 2023-04-16 DIAGNOSIS — E611 Iron deficiency: Secondary | ICD-10-CM

## 2023-04-16 MED ORDER — SODIUM CHLORIDE 0.9 % IV SOLN: Freq: Once | INTRAVENOUS | Status: AC

## 2023-04-16 MED ORDER — SODIUM CHLORIDE 0.9 % IV SOLN
300.0000 mg | Freq: Once | INTRAVENOUS | Status: AC
  Administered 2023-04-16: 300 mg via INTRAVENOUS
  Filled 2023-04-16: qty 300

## 2023-04-16 NOTE — Patient Instructions (Signed)

## 2023-04-16 NOTE — Progress Notes (Signed)
 Patient presents today for Venofer infusion per providers order.  Vital signs and labs WNL.  Patient took premedications at home prior to appointment.  No new complaints at this time.   Peripheral IV started and blood return noted pre and post infusion.  Treatment given today per MD orders.  Stable during infusion without adverse affects.  Vital signs stable.  No complaints at this time.  Discharge from clinic ambulatory in stable condition.  Alert and oriented X 3.  Follow up with [facility] as scheduled.

## 2023-05-13 ENCOUNTER — Encounter: Payer: Self-pay | Admitting: Podiatry

## 2023-05-13 ENCOUNTER — Ambulatory Visit: Admitting: Podiatry

## 2023-05-13 VITALS — Ht 69.0 in | Wt 214.0 lb

## 2023-05-13 DIAGNOSIS — L97521 Non-pressure chronic ulcer of other part of left foot limited to breakdown of skin: Secondary | ICD-10-CM | POA: Diagnosis not present

## 2023-05-13 DIAGNOSIS — E1142 Type 2 diabetes mellitus with diabetic polyneuropathy: Secondary | ICD-10-CM | POA: Diagnosis not present

## 2023-05-13 DIAGNOSIS — Z794 Long term (current) use of insulin: Secondary | ICD-10-CM

## 2023-05-13 DIAGNOSIS — L84 Corns and callosities: Secondary | ICD-10-CM | POA: Diagnosis not present

## 2023-05-13 DIAGNOSIS — L97511 Non-pressure chronic ulcer of other part of right foot limited to breakdown of skin: Secondary | ICD-10-CM | POA: Diagnosis not present

## 2023-05-13 MED ORDER — CEPHALEXIN 500 MG PO CAPS: 500.0000 mg | ORAL_CAPSULE | Freq: Three times a day (TID) | ORAL | 0 refills | Status: AC

## 2023-05-13 MED ORDER — GENTAMICIN SULFATE 0.1 % EX OINT: 1.0000 | TOPICAL_OINTMENT | Freq: Every day | CUTANEOUS | 0 refills | Status: AC

## 2023-05-13 NOTE — Progress Notes (Signed)
 Subjective:  Patient ID: Anna Salinas, female    DOB: April 13, 1970,  MRN: [id number]  Chief Complaint  Patient presents with   Callouses    Pt is here due to callous on the left foot, she states they have been there for about a month, at first they were not painful but now they are, pt is a diabetic.    Discussed the use of AI scribe software for clinical note transcription with the patient, who gave verbal consent to proceed.  History of Present Illness Anna Salinas is a 53 year old female with diabetes who presents with calluses and foot pain.  She has calluses on her foot, with one on the side being more painful than the one on the toe. These calluses have been present for about a month and resemble dried blood on the skin. They have developed from pressure points due to a bunion and a tailor's bunion, causing increased pressure on her foot.  She has diabetes with a recent A1c of 10.9, indicating poor glycemic control. She experiences constant numbness in her feet, attributed to neuropathy. No drainage from the calluses is noted.  She stands on concrete floors all day at work and has been wearing her son's shoes recently because her own shoes feel tight. She typically wears [employer], and [employer], but finds the latter uncomfortable. She is required to wear closed shoes for work.  She is on warfarin due to an aortic valve replacement in 2016. She does not smoke.      Objective:    Physical Exam VASCULAR: DP and PT pulse palpable. Foot is warm and well-perfused. Capillary fill time is brisk. DERMATOLOGIC: Large calluses with blood blister deep to the end of the right medial first gray toe and the lateral fifth metatarsal. Partial thickness skin breakdown. No active signs of infection, cellulitis, purulence, or drainage. No exposed bone, tendon, or joint. NEUROLOGIC: Diffuse polyneuropathy with lack of protective sensation. No paresthesias on  examination. ORTHOPEDIC: Smooth pain-free range of motion of all examined joints. No ecchymosis or bruising. No gross deformity. No pain to palpation.       Results Procedure: Callus debridement Description: Sharply debrided the calluses on the right medial first great toe and the lateral fifth metatarsal. Observed large calluses with blood blister deep to the skin. Partial thickness skin breakdown noted. No active signs of infection, cellulitis, purulence, or drainage. No exposed bone, tendon, or joint.  LABS HbA1c: 10.9%   Assessment:   1. Ulcer of both feet, limited to breakdown of skin (HCC)   2. Type 2 diabetes mellitus with diabetic polyneuropathy, with long-term current use of insulin  (HCC)   3. Pre-ulcerative calluses      Plan:  Patient was evaluated and treated and all questions answered.  Assessment and Plan Assessment & Plan Type 2 diabetes mellitus with diabetic foot ulcer Diabetic foot ulcer present for about a month on the right medial first gray toe and lateral fifth metatarsal. Ulcers are shallow with partial thickness skin breakdown, no signs of infection, cellulitis, purulence, or drainage. Likely due to pressure points from bunions and standing on concrete floors. Elevated A1c at 10.9 indicates poor glycemic control, contributing to ulcer development and healing challenges. Surgical intervention for bunions deferred until better glycemic control is achieved. - Prescribe oral antibiotics for one week to prevent infection. - Prescribe gentamicin ointment for daily application. - Advise covering ulcers with a Band-Aid post-bathing. - Recommend wearing a surgical shoe to offload pressure  from ulcers. - Advise taking a week off work for healing and shoe fitting. - Discuss with employer potential for temporary seated or light duty role upon return to work.  Diabetic polyneuropathy Diffuse polyneuropathy with lack of protective sensation, contributing to foot ulcer  development. No active paresthesias on examination.  Bunion and tailor's bunion Bunion and tailor's bunion causing pressure points, contributing to callus and ulcer formation. Surgical intervention deferred until better glycemic control is achieved. Emphasis on appropriate footwear to alleviate pressure. - Recommend shoes with a wide toe box and width to reduce pressure on bunions. - Suggest visiting [employer] or [employer] for shoe fitting and recommendations.  Aortic valve replacement Aortic valve replacement in 2016, currently on warfarin therapy. No immediate concerns related to current foot issues.      Return in about 2 weeks (around 05/27/2023) for wound care.

## 2023-05-21 ENCOUNTER — Other Ambulatory Visit (HOSPITAL_COMMUNITY): Payer: Self-pay | Admitting: Gastroenterology

## 2023-05-21 DIAGNOSIS — R188 Other ascites: Secondary | ICD-10-CM

## 2023-05-23 ENCOUNTER — Inpatient Hospital Stay

## 2023-05-26 ENCOUNTER — Ambulatory Visit (HOSPITAL_COMMUNITY)
Admission: RE | Admit: 2023-05-26 | Discharge: 2023-05-26 | Disposition: A | Discharge status: Home / Self Care | Source: Ambulatory Visit | Attending: Gastroenterology | Admitting: Gastroenterology

## 2023-05-26 ENCOUNTER — Inpatient Hospital Stay: Attending: Hematology

## 2023-05-26 DIAGNOSIS — D509 Iron deficiency anemia, unspecified: Secondary | ICD-10-CM | POA: Insufficient documentation

## 2023-05-26 DIAGNOSIS — K7460 Unspecified cirrhosis of liver: Secondary | ICD-10-CM | POA: Insufficient documentation

## 2023-05-26 DIAGNOSIS — D696 Thrombocytopenia, unspecified: Secondary | ICD-10-CM

## 2023-05-26 DIAGNOSIS — R188 Other ascites: Secondary | ICD-10-CM | POA: Diagnosis present

## 2023-05-26 LAB — CBC WITH DIFFERENTIAL/PLATELET
Abs Immature Granulocytes: 0.01 10*3/uL (ref 0.00–0.07)
Basophils Absolute: 0 10*3/uL (ref 0.0–0.1)
Basophils Relative: 1 %
Eosinophils Absolute: 0.1 10*3/uL (ref 0.0–0.5)
Eosinophils Relative: 3 %
HCT: 41.8 % (ref 36.0–46.0)
Hemoglobin: 13.3 g/dL (ref 12.0–15.0)
Immature Granulocytes: 0 %
Lymphocytes Relative: 24 %
Lymphs Abs: 0.9 10*3/uL (ref 0.7–4.0)
MCH: 28.9 pg (ref 26.0–34.0)
MCHC: 31.8 g/dL (ref 30.0–36.0)
MCV: 90.7 fL (ref 80.0–100.0)
Monocytes Absolute: 0.3 10*3/uL (ref 0.1–1.0)
Monocytes Relative: 7 %
Neutro Abs: 2.5 10*3/uL (ref 1.7–7.7)
Neutrophils Relative %: 65 %
Platelets: 47 10*3/uL — ABNORMAL LOW (ref 150–400)
RBC: 4.61 MIL/uL (ref 3.87–5.11)
RDW: 14 % (ref 11.5–15.5)
WBC: 3.8 10*3/uL — ABNORMAL LOW (ref 4.0–10.5)
nRBC: 0 % (ref 0.0–0.2)

## 2023-05-29 ENCOUNTER — Encounter: Payer: Self-pay | Admitting: Podiatry

## 2023-05-29 ENCOUNTER — Ambulatory Visit: Admitting: Podiatry

## 2023-05-29 VITALS — Ht 69.0 in | Wt 214.0 lb

## 2023-05-29 DIAGNOSIS — L97511 Non-pressure chronic ulcer of other part of right foot limited to breakdown of skin: Secondary | ICD-10-CM | POA: Diagnosis not present

## 2023-05-29 DIAGNOSIS — L97521 Non-pressure chronic ulcer of other part of left foot limited to breakdown of skin: Secondary | ICD-10-CM

## 2023-06-01 NOTE — Progress Notes (Signed)
  Subjective:  Patient ID: Anna Salinas, female    DOB: Jul 02, 1970,  MRN: [id number]  Chief Complaint  Patient presents with   Wound Check    Patient is here for wound care on right foot. Pt states there is no pain associated with the wounds on right foot.    Discussed the use of AI scribe software for clinical note transcription with the patient, who gave verbal consent to proceed.  History of Present Illness Anna Salinas is a 53 year old female with diabetes who presents with calluses and foot pain. She notes improvement      Objective:    Physical Exam VASCULAR: DP and PT pulse palpable. Foot is warm and well-perfused. Capillary fill time is brisk. DERMATOLOGIC: Large calluses with blood blister deep to the end of the right medial first toe and the lateral fifth metatarsal. Partial thickness skin breakdown. No active signs of infection, cellulitis, purulence, or drainage. No exposed bone, tendon, or joint.  Lateral ulcer has essentially healed with callus NEUROLOGIC: Diffuse polyneuropathy with lack of protective sensation. No paresthesias on examination. ORTHOPEDIC: Smooth pain-free range of motion of all examined joints. No ecchymosis or bruising. No gross deformity. No pain to palpation.       Results Procedure: Callus debridement Description: Sharply debrided the calluses on the right medial first great toe and the lateral fifth metatarsal. Observed large calluses with blood blister deep to the skin. Partial thickness skin breakdown noted. No active signs of infection, cellulitis, purulence, or drainage. No exposed bone, tendon, or joint.  LABS HbA1c: 10.9%   Assessment:   1. Ulcer of both feet, limited to breakdown of skin [facility])       Plan:  Patient was evaluated and treated and all questions answered.  Assessment and Plan Assessment & Plan Type 2 diabetes mellitus with diabetic foot ulcer Diabetic foot ulcer present for about a month on the right  medial first gray toe and lateral fifth metatarsal. Ulcers are shallow with partial thickness skin breakdown, no signs of infection, cellulitis, purulence, or drainage. Likely due to pressure points from bunions and standing on concrete floors. Elevated A1c at 10.9 indicates poor glycemic control, contributing to ulcer development and healing challenges.  - Improved, debrided the overlying hyperkeratosis and only a small amount of the medial hallux ulcer remains.  Callus on fifth metatarsal has essentially healed.  Continue use of gentamicin  ointment.  Return in 3 to 4 weeks to reevaluate      Return in about 3 weeks (around 06/19/2023) for wound care.

## 2023-06-05 ENCOUNTER — Ambulatory Visit (HOSPITAL_COMMUNITY)

## 2023-06-26 ENCOUNTER — Encounter: Payer: Self-pay | Admitting: Podiatry

## 2023-06-26 ENCOUNTER — Ambulatory Visit (INDEPENDENT_AMBULATORY_CARE_PROVIDER_SITE_OTHER): Admitting: Podiatry

## 2023-06-26 DIAGNOSIS — L97511 Non-pressure chronic ulcer of other part of right foot limited to breakdown of skin: Secondary | ICD-10-CM

## 2023-06-26 DIAGNOSIS — L97521 Non-pressure chronic ulcer of other part of left foot limited to breakdown of skin: Secondary | ICD-10-CM | POA: Diagnosis not present

## 2023-06-26 NOTE — Progress Notes (Signed)
  Subjective:  Patient ID: Anna Salinas, female    DOB: Apr 05, 1970,  MRN: [id number]  Chief Complaint  Patient presents with   Wound Check    I thought it was doing better but it's going back the way it was.    Discussed the use of AI scribe software for clinical note transcription with the patient, who gave verbal consent to proceed.  History of Present Illness ELISHEBA MCDONNELL is a 53 year old female with diabetes who presents with calluses and foot pain. She notes buildup of the callus has not had active drainage from it though but there is bruising      Objective:    Physical Exam VASCULAR: DP and PT pulse palpable. Foot is warm and well-perfused. Capillary fill time is brisk. DERMATOLOGIC: Medial callus bilateral medial hallux, right side has subdermal hemorrhage  NEUROLOGIC: Diffuse polyneuropathy with lack of protective sensation. No paresthesias on examination. ORTHOPEDIC: Smooth pain-free range of motion of all examined joints. No ecchymosis or bruising. No gross deformity. No pain to palpation.       Results Procedure: Callus debridement Description: Sharply debrided the calluses on the right medial first great toe and the lateral fifth metatarsal. Observed large calluses with blood blister deep to the skin. Partial thickness skin breakdown noted. No active signs of infection, cellulitis, purulence, or drainage. No exposed bone, tendon, or joint.  LABS HbA1c: 10.9%   Assessment:   1. Ulcer of both feet, limited to breakdown of skin [facility])       Plan:  Patient was evaluated and treated and all questions answered.  Assessment and Plan Assessment & Plan Type 2 diabetes mellitus with diabetic foot ulcer Improved overall, her blood sugar continues to improve and hopefully should continue to heal.  Calluses debrided of the nonviable tissue and hyperkeratosis to alleviate pressure and there is no active drainage, Iodosorb was applied to help with maceration  deep to the callus on the right side and she will resume using the gentamicin  ointment tomorrow.  We discussed how her hallux valgus deformity contributes to the formation of this and reduction of any spurring or correction of the deformity long-term may be of benefit if her A1c has reduced to less than 8%.      Return in about 3 weeks (around 07/17/2023) for wound care.

## 2023-07-15 ENCOUNTER — Ambulatory Visit: Admitting: Podiatry

## 2023-07-17 ENCOUNTER — Ambulatory Visit: Admitting: Podiatry

## 2023-07-25 ENCOUNTER — Inpatient Hospital Stay

## 2023-08-01 ENCOUNTER — Inpatient Hospital Stay

## 2023-08-11 ENCOUNTER — Ambulatory Visit (INDEPENDENT_AMBULATORY_CARE_PROVIDER_SITE_OTHER)

## 2023-08-11 ENCOUNTER — Ambulatory Visit (INDEPENDENT_AMBULATORY_CARE_PROVIDER_SITE_OTHER): Admitting: Podiatry

## 2023-08-11 VITALS — Ht 69.0 in | Wt 214.0 lb

## 2023-08-11 DIAGNOSIS — M2011 Hallux valgus (acquired), right foot: Secondary | ICD-10-CM | POA: Diagnosis not present

## 2023-08-11 DIAGNOSIS — M21611 Bunion of right foot: Secondary | ICD-10-CM

## 2023-08-11 DIAGNOSIS — M2012 Hallux valgus (acquired), left foot: Secondary | ICD-10-CM

## 2023-08-11 DIAGNOSIS — L97511 Non-pressure chronic ulcer of other part of right foot limited to breakdown of skin: Secondary | ICD-10-CM

## 2023-08-11 DIAGNOSIS — L97521 Non-pressure chronic ulcer of other part of left foot limited to breakdown of skin: Secondary | ICD-10-CM

## 2023-08-11 DIAGNOSIS — M21612 Bunion of left foot: Secondary | ICD-10-CM

## 2023-08-11 NOTE — Progress Notes (Signed)
  Subjective:  Patient ID: Anna Salinas, female    DOB: 1970/08/07,  MRN: [id number]  Chief Complaint  Patient presents with   Wound Check    RM 19 Patient is here for f/up wound check of ulcer of the left and right hallux.  Ulcers are scabbed over with no drainage.Patient states no pain or discomfort.     Discussed the use of AI scribe software for clinical note transcription with the patient, who gave verbal consent to proceed.  History of Present Illness [name] is a 53 year old female with diabetes who presents with calluses and foot pain. She notes buildup of the callus has not had active drainage from it though but there is bruising      Objective:    Physical Exam VASCULAR: DP and PT pulse palpable. Foot is warm and well-perfused. Capillary fill time is brisk. DERMATOLOGIC: Bilateral hallux medial callus with subdermal hemorrhage NEUROLOGIC: Diffuse polyneuropathy with lack of protective sensation. No paresthesias on examination. ORTHOPEDIC: Smooth pain-free range of motion of all examined joints. No ecchymosis or bruising. No gross deformity. No pain to palpation.       Results Procedure: Callus debridement Description: Sharply debrided the calluses on the bilateral medial first great toe. Observed large calluses with blood blister deep to the skin. Partial thickness skin breakdown noted. No active signs of infection, cellulitis, purulence, or drainage. No exposed bone, tendon, or joint.  LABS HbA1c: 10.9%    Radiographs of both feet taken today show hallux valgus deformity with prominence and some spurring of medial base of distal phalanx Assessment:   1. Hallux valgus with bunions, right   2. Hallux valgus with bunions, left   3. Ulcer of both feet, limited to breakdown of skin [facility])       Plan:  Patient was evaluated and treated and all questions answered.  Assessment and Plan Assessment & Plan Type 2 diabetes mellitus with diabetic foot  ulcer Continues to improve.  Calluses debrided to remove the overlying hyperkeratosis.  No deep ulceration is present.  Betadine and Band-Aid applied may leave open to air at home.  We reviewed her x-rays taken today and discussed surgical correction is possible likely would need Akin osteotomy and reduction of the bony prominence.  Would not proceed with this until her A1c is lower under 8%, she is due for check with this with her PCP in the upcoming weeks and she will have these results forwarded to me.  Return in 5 to 6 weeks for removal of callus again.      No follow-ups on file.

## 2023-08-13 ENCOUNTER — Other Ambulatory Visit: Payer: Self-pay | Admitting: Gastroenterology

## 2023-08-13 DIAGNOSIS — R188 Other ascites: Secondary | ICD-10-CM

## 2023-08-22 ENCOUNTER — Other Ambulatory Visit

## 2023-08-28 ENCOUNTER — Ambulatory Visit
Admission: RE | Admit: 2023-08-28 | Discharge: 2023-08-28 | Disposition: A | Discharge status: Home / Self Care | Source: Ambulatory Visit | Attending: Gastroenterology | Admitting: Gastroenterology

## 2023-08-28 DIAGNOSIS — K7460 Unspecified cirrhosis of liver: Secondary | ICD-10-CM

## 2023-09-18 ENCOUNTER — Ambulatory Visit: Admitting: Podiatry

## 2023-09-24 ENCOUNTER — Inpatient Hospital Stay

## 2023-10-01 ENCOUNTER — Inpatient Hospital Stay: Admitting: Physician Assistant

## 2023-10-16 ENCOUNTER — Ambulatory Visit (INDEPENDENT_AMBULATORY_CARE_PROVIDER_SITE_OTHER): Admitting: Podiatry

## 2023-10-16 ENCOUNTER — Encounter: Payer: Self-pay | Admitting: Podiatry

## 2023-10-16 VITALS — Ht 69.0 in | Wt 214.0 lb

## 2023-10-16 DIAGNOSIS — L84 Corns and callosities: Secondary | ICD-10-CM | POA: Diagnosis not present

## 2023-10-16 DIAGNOSIS — M21611 Bunion of right foot: Secondary | ICD-10-CM

## 2023-10-16 DIAGNOSIS — M2011 Hallux valgus (acquired), right foot: Secondary | ICD-10-CM

## 2023-10-16 DIAGNOSIS — M21612 Bunion of left foot: Secondary | ICD-10-CM

## 2023-10-16 DIAGNOSIS — M2012 Hallux valgus (acquired), left foot: Secondary | ICD-10-CM

## 2023-10-16 NOTE — Progress Notes (Signed)
  Subjective:  Patient ID: Anna Salinas, female    DOB: [birth year],  MRN: [id number]  Chief Complaint  Patient presents with   Bunions    Hallux valgus with bunions, right patient states that they are still the same no change    Discussed the use of AI scribe software for clinical note transcription with the patient, who gave verbal consent to proceed.  History of Present Illness Anna Salinas is a 53 year old female with diabetes who presents with calluses and foot pain. She notes buildup of the callus has not had active drainage from it though but there is bruising      Objective:    Physical Exam VASCULAR: DP and PT pulse palpable. Foot is warm and well-perfused. Capillary fill time is brisk. DERMATOLOGIC: Bilateral hallux medial callus with subdermal hemorrhage NEUROLOGIC: Diffuse polyneuropathy with lack of protective sensation. No paresthesias on examination. ORTHOPEDIC: Smooth pain-free range of motion of all examined joints. No ecchymosis or bruising. No gross deformity. No pain to palpation.       Results Procedure: Callus debridement Description: Sharply debrided the calluses on the bilateral medial first great toe. Observed large calluses with blood blister deep to the skin. Partial thickness skin breakdown noted. No active signs of infection, cellulitis, purulence, or drainage. No exposed bone, tendon, or joint.  LABS HbA1c: 10.6%    Radiographs of both feet taken previously show hallux valgus deformity with prominence and some spurring of medial base of distal phalanx Assessment:   1. Pre-ulcerative calluses   2. Hallux valgus with bunions, right   3. Hallux valgus with bunions, left       Plan:  Patient was evaluated and treated and all questions answered.  Assessment and Plan Assessment & Plan Type 2 diabetes mellitus with diabetic foot ulcer Calluses sharply debrided to reduce the prominence over the bony areas.  Her A1c was checked since  last visit and is 10.6 still too elevated to proceed with any surgical correction of the deformity.  Follow-up in 6 weeks for callus treatment again and she will continue to monitor her blood sugar closely.  Would not be due for another check in a few months      No follow-ups on file.

## 2023-11-27 ENCOUNTER — Ambulatory Visit: Admitting: Podiatry

## 2023-12-30 ENCOUNTER — Ambulatory Visit: Admitting: Podiatry

## 2023-12-30 VITALS — Ht 69.0 in | Wt 214.0 lb

## 2023-12-30 DIAGNOSIS — L97521 Non-pressure chronic ulcer of other part of left foot limited to breakdown of skin: Secondary | ICD-10-CM | POA: Diagnosis not present

## 2023-12-30 DIAGNOSIS — L97511 Non-pressure chronic ulcer of other part of right foot limited to breakdown of skin: Secondary | ICD-10-CM | POA: Diagnosis not present

## 2024-01-01 NOTE — Progress Notes (Signed)
°  Subjective:  Patient ID: Anna Salinas, female    DOB: [birth year],  MRN: [id number]  Chief Complaint  Patient presents with   Wound Check    RM 2 Patient is here for preulcerative calluses of the right and left hallux. Calluses have a dark discoloration.    Discussed the use of AI scribe software for clinical note transcription with the patient, who gave verbal consent to proceed.  History of Present Illness Anna Salinas is a 53 year old female with diabetes who presents with calluses and foot pain. She notes buildup of the callus has had a lot of bleeding beneath the skin surface and bruising      Objective:    Physical Exam VASCULAR: DP and PT pulse palpable. Foot is warm and well-perfused. Capillary fill time is brisk. DERMATOLOGIC: Bilateral hallux medial callus with subdermal hemorrhage NEUROLOGIC: Diffuse polyneuropathy with lack of protective sensation. No paresthesias on examination. ORTHOPEDIC: Smooth pain-free range of motion of all examined joints. No ecchymosis or bruising. No gross deformity. No pain to palpation.       Results Procedure: Callus debridement Description: Sharply debrided the calluses on the bilateral medial first great toe. Observed large calluses with blood blister deep to the skin. Partial thickness skin breakdown noted. No active signs of infection, cellulitis, purulence, or drainage. No exposed bone, tendon, or joint.  LABS HbA1c: 10.6%    Radiographs of both feet taken previously show hallux valgus deformity with prominence and some spurring of medial base of distal phalanx Assessment:   1. Ulcer of both feet, limited to breakdown of skin [facility])       Plan:  Patient was evaluated and treated and all questions answered.  Assessment and Plan Assessment & Plan Type 2 diabetes mellitus with diabetic foot ulcer Calluses sharply debrided to reduce the prominence over the bony areas.  Her A1c is still too elevated to proceed with  any surgical correction of the deformity.  Follow-up in 6 weeks for callus treatment again and she will continue to monitor her blood sugar closely.         No follow-ups on file.

## 2024-01-05 ENCOUNTER — Emergency Department (HOSPITAL_COMMUNITY)

## 2024-01-05 ENCOUNTER — Other Ambulatory Visit: Payer: Self-pay

## 2024-01-05 ENCOUNTER — Encounter (HOSPITAL_COMMUNITY): Payer: Self-pay | Admitting: *Deleted

## 2024-01-05 ENCOUNTER — Emergency Department (HOSPITAL_COMMUNITY): Admission: EM | Admit: 2024-01-05 | Discharge: 2024-01-05 | Disposition: A | Discharge status: Home / Self Care

## 2024-01-05 DIAGNOSIS — Z7901 Long term (current) use of anticoagulants: Secondary | ICD-10-CM | POA: Diagnosis not present

## 2024-01-05 DIAGNOSIS — R079 Chest pain, unspecified: Secondary | ICD-10-CM | POA: Diagnosis present

## 2024-01-05 DIAGNOSIS — K30 Functional dyspepsia: Secondary | ICD-10-CM | POA: Diagnosis not present

## 2024-01-05 DIAGNOSIS — R0789 Other chest pain: Secondary | ICD-10-CM

## 2024-01-05 LAB — CBC WITH DIFFERENTIAL/PLATELET
Abs Immature Granulocytes: 0.02 K/uL (ref 0.00–0.07)
Basophils Absolute: 0 K/uL (ref 0.0–0.1)
Basophils Relative: 0 %
Eosinophils Absolute: 0.1 K/uL (ref 0.0–0.5)
Eosinophils Relative: 2 %
HCT: 38.5 % (ref 36.0–46.0)
Hemoglobin: 12.8 g/dL (ref 12.0–15.0)
Immature Granulocytes: 0 %
Lymphocytes Relative: 14 %
Lymphs Abs: 0.7 K/uL (ref 0.7–4.0)
MCH: 29.8 pg (ref 26.0–34.0)
MCHC: 33.2 g/dL (ref 30.0–36.0)
MCV: 89.7 fL (ref 80.0–100.0)
Monocytes Absolute: 0.3 K/uL (ref 0.1–1.0)
Monocytes Relative: 6 %
Neutro Abs: 3.8 K/uL (ref 1.7–7.7)
Neutrophils Relative %: 78 %
Platelets: 39 K/uL — ABNORMAL LOW (ref 150–400)
RBC: 4.29 MIL/uL (ref 3.87–5.11)
RDW: 14.4 % (ref 11.5–15.5)
WBC: 4.9 K/uL (ref 4.0–10.5)
nRBC: 0 % (ref 0.0–0.2)

## 2024-01-05 LAB — TROPONIN T, HIGH SENSITIVITY
Troponin T High Sensitivity: <15 ng/L (ref 0–19)
Troponin T High Sensitivity: <15 ng/L (ref 0–19)

## 2024-01-05 LAB — HEPATIC FUNCTION PANEL
ALT: 51 U/L — ABNORMAL HIGH (ref 0–44)
AST: 105 U/L — ABNORMAL HIGH (ref 15–41)
Albumin: 3.8 g/dL (ref 3.5–5.0)
Alkaline Phosphatase: 106 U/L (ref 38–126)
Bilirubin, Direct: 1 mg/dL — ABNORMAL HIGH (ref 0.0–0.2)
Indirect Bilirubin: 0.6 mg/dL (ref 0.3–0.9)
Total Bilirubin: 1.6 mg/dL — ABNORMAL HIGH (ref 0.0–1.2)
Total Protein: 6.6 g/dL (ref 6.5–8.1)

## 2024-01-05 LAB — BASIC METABOLIC PANEL WITH GFR
Anion gap: 12 (ref 5–15)
BUN: 8 mg/dL (ref 6–20)
CO2: 23 mmol/L (ref 22–32)
Calcium: 8.7 mg/dL — ABNORMAL LOW (ref 8.9–10.3)
Chloride: 101 mmol/L (ref 98–111)
Creatinine, Ser: 0.57 mg/dL (ref 0.44–1.00)
GFR, Estimated: >60 mL/min
Glucose, Bld: 357 mg/dL — ABNORMAL HIGH (ref 70–99)
Potassium: 4.2 mmol/L (ref 3.5–5.1)
Sodium: 135 mmol/L (ref 135–145)

## 2024-01-05 LAB — LIPASE, BLOOD: Lipase: 45 U/L (ref 11–51)

## 2024-01-05 MED ORDER — FAMOTIDINE 40 MG PO TABS: 40.0000 mg | ORAL_TABLET | Freq: Every day | ORAL | 0 refills | Status: AC

## 2024-01-05 MED ORDER — ALUM & MAG HYDROXIDE-SIMETH 200-200-20 MG/5ML PO SUSP
30.0000 mL | Freq: Once | ORAL | Status: AC
  Administered 2024-01-05: 30 mL via ORAL
  Filled 2024-01-05: qty 30

## 2024-01-05 MED ORDER — LIDOCAINE VISCOUS HCL 2 % MT SOLN
15.0000 mL | Freq: Once | OROMUCOSAL | Status: AC
  Administered 2024-01-05: 15 mL via ORAL
  Filled 2024-01-05: qty 15

## 2024-01-05 NOTE — ED Notes (Signed)
 X-ray at bedside.

## 2024-01-05 NOTE — Discharge Instructions (Signed)
 Take your Pepcid  as prescribed.  Call and follow-up with your primary care doctor in 1 to 2 weeks.  Return to the ER for any new or worsening symptoms.

## 2024-01-05 NOTE — ED Triage Notes (Signed)
 Pt states she woke up this am feeling dizzy, so pt ate a cookie thinking her sugar was low when she suddenly had central chest pain with pain radiating down right arm and around upper back to both sides  Pt has some nausea with pain

## 2024-01-05 NOTE — ED Provider Notes (Signed)
 " [place] EMERGENCY DEPARTMENT AT [facility] Provider Note   CSN: [id number] Arrival date & time: 01/05/24  9262     Patient presents with: Chest Pain   Anna Salinas is a 53 y.o. female.   53 year old female presents for evaluation of chest pain.  States she got up this morning felt lightheaded and thought maybe her sugar was low.  States she went to eat a cookie and then after that had some right sided chest and shoulder pain that radiated around to her back.  She states it comes and goes and feels like a pressure.  States it feels like indigestion.  States when it first happened she got sweaty and short of breath.  She denies any other symptoms or concerns.   Chest Pain Associated symptoms: no abdominal pain, no back pain, no cough, no fever, no palpitations, no shortness of breath and no vomiting        Prior to Admission medications  Medication Sig Start Date End Date Taking? Authorizing Provider  famotidine  (PEPCID ) 40 MG tablet Take 1 tablet (40 mg total) by mouth daily. 01/05/24  Yes Monque Haggar L, DO  carvedilol  (COREG ) 3.125 MG tablet Take by mouth. 02/10/23 02/05/24  [provider]  cephALEXin  (KEFLEX ) 500 MG capsule Take 1 capsule (500 mg total) by mouth 3 (three) times daily. 05/13/23   McDonald, Juliene SAUNDERS, DPM  Continuous Glucose Sensor (DEXCOM G7 SENSOR) MISC INJECT 1 APPLICATION INTO THE SKIN AS DIRECTED. CHANGE SENSOR EVERY 10 DAYS AS DIRECTED. 12/19/22   Therisa Benton PARAS, NP  dicyclomine  (BENTYL ) 10 MG capsule TAKE 1 CAPSULE BY MOUTH 3 TIMES A DAY BEFORE MEALS AS NEEDED FOR LOOSE STOOLS/ABDOMINAL CRAMPING 01/15/23   Rudy Josette RAMAN, PA-C  gentamicin  ointment (GARAMYCIN ) 0.1 % Apply 1 Application topically daily. Apply to wound daily 05/13/23   McDonald, Juliene SAUNDERS, DPM  metFORMIN  (GLUCOPHAGE -XR) 500 MG 24 hr tablet Take 1 tablet (500 mg total) by mouth 2 (two) times daily. 06/01/18   Gladis Mustard, FNP  nadolol  (CORGARD ) 20 MG tablet TAKE 1  TABLET BY MOUTH EVERY DAY 10/15/22   Rudy Josette RAMAN, PA-C  NOVOLOG  MIX 70/30 FLEXPEN (70-30) 100 UNIT/ML FlexPen Inject 30-60 Units into the skin See admin instructions. 60 units in the morning. 30 units at night. 07/21/21   [provider]  rosuvastatin  (CRESTOR ) 10 MG tablet Take 1 tablet (10 mg total) by mouth daily. Patient taking differently: Take 5 mg by mouth at bedtime. 06/01/18   Gladis Mary-Margaret, FNP  warfarin (COUMADIN ) 5 MG tablet Take 0.5-1 tablets (2.5-5 mg total) by mouth See admin instructions. Take 2.5 mg by mouth daily except take 5 mg by mouth on Monday, Wednesday and Friday Patient taking differently: Take 2.5-5 mg by mouth See admin instructions. Take 5 mg  on Tuesday and Thursday, all the other days take 2.5 mg  in the evening 06/01/18   Gladis Mustard, FNP    Allergies: Glipizide, Metoprolol , and Shellfish allergy    Review of Systems  Constitutional:  Negative for chills and fever.  HENT:  Negative for ear pain and sore throat.   Eyes:  Negative for pain and visual disturbance.  Respiratory:  Negative for cough and shortness of breath.   Cardiovascular:  Positive for chest pain. Negative for palpitations.  Gastrointestinal:  Negative for abdominal pain and vomiting.  Genitourinary:  Negative for dysuria and hematuria.  Musculoskeletal:  Negative for arthralgias and back pain.  Skin:  Negative for color change  and rash.  Neurological:  Negative for seizures and syncope.  All other systems reviewed and are negative.   Updated Vital Signs BP (!) 125/59   Pulse 66   Temp 97.9 F (36.6 C) (Oral)   Resp 15   Ht 5' 9 (1.753 m)   Wt 93.9 kg   LMP 04/14/2017   SpO2 100%   BMI 30.57 kg/m   Physical Exam Vitals and nursing note reviewed.  Constitutional:      General: She is not in acute distress.    Appearance: She is well-developed. She is not ill-appearing.  HENT:     Head: Normocephalic and atraumatic.  Eyes:     Conjunctiva/sclera:  Conjunctivae normal.  Cardiovascular:     Rate and Rhythm: Normal rate and regular rhythm.     Heart sounds: No murmur heard. Pulmonary:     Effort: Pulmonary effort is normal. No respiratory distress.     Breath sounds: Normal breath sounds.  Abdominal:     Palpations: Abdomen is soft.     Tenderness: There is no abdominal tenderness.  Musculoskeletal:        General: No swelling.     Cervical back: Neck supple.  Skin:    General: Skin is warm and dry.     Capillary Refill: Capillary refill takes less than 2 seconds.  Neurological:     Mental Status: She is alert.  Psychiatric:        Mood and Affect: Mood normal.     (all labs ordered are listed, but only abnormal results are displayed) Labs Reviewed  BASIC METABOLIC PANEL WITH GFR - Abnormal; Notable for the following components:      Result Value   Glucose, Bld 357 (*)    Calcium  8.7 (*)    All other components within normal limits  CBC WITH DIFFERENTIAL/PLATELET - Abnormal; Notable for the following components:   Platelets 39 (*)    All other components within normal limits  HEPATIC FUNCTION PANEL - Abnormal; Notable for the following components:   AST 105 (*)    ALT 51 (*)    Total Bilirubin 1.6 (*)    Bilirubin, Direct 1.0 (*)    All other components within normal limits  LIPASE, BLOOD  TROPONIN T, HIGH SENSITIVITY  TROPONIN T, HIGH SENSITIVITY    EKG: None  Radiology: DG Chest 1 View Result Date: 01/05/2024 CLINICAL DATA:  Chest pain EXAM: DG CHEST 1V COMPARISON:  None Available. FINDINGS: The heart size and mediastinal contours are within normal limits. Both lungs are clear. The visualized skeletal structures are unremarkable. IMPRESSION: No active disease. Electronically Signed   By: Camellia Candle M.D.   On: 01/05/2024 08:20     Procedures   Medications Ordered in the ED  alum & mag hydroxide-simeth (MAALOX/MYLANTA) 200-200-20 MG/5ML suspension 30 mL (30 mLs Oral Given 01/05/24 0904)    And   lidocaine  (XYLOCAINE ) 2 % viscous mouth solution 15 mL (15 mLs Oral Given 01/05/24 9095)                                    Medical Decision Making Cardiac monitor interpretation: Sinus rhythm, no ectopy  Patient here for chest pain that has resolved at this time.  Troponins are negative x 2 and workup unremarkable.  Feeling better after GI cocktail she has had no recurrence of her symptoms in the ER.  I will place her  on Pepcid  to take daily, advised to Supple primary care doctor and otherwise return to the ER for new or worsening symptoms.  Patient and family bedside feel comfortable with plan be discharged home.  All results were discussed with them.  Problems Addressed: Atypical chest pain: acute illness or injury Indigestion: acute illness or injury  Amount and/or Complexity of Data Reviewed External Data Reviewed: notes.    Details: No prior ED records for review Labs: ordered. Decision-making details documented in ED Course.    Details: Ordered and reviewed by me and unremarkable, negative troponins x 2 Radiology: ordered and independent interpretation performed. Decision-making details documented in ED Course.    Details: Ordered and interpreted by me independently of radiology Chest x-ray: Shows no acute abnormality ECG/medicine tests: ordered and independent interpretation performed. Decision-making details documented in ED Course.    Details: Ordered and interpreted by me in the absence of cardiology and shows sinus rhythm, no STEMI, or significant change when compared to prior EKG  Risk OTC drugs. Prescription drug management.     Final diagnoses:  Atypical chest pain  Indigestion    ED Discharge Orders          Ordered    famotidine  (PEPCID ) 40 MG tablet  Daily        01/05/24 [address] Duwaine CROME, DO 01/05/24 1449  "

## 2024-02-05 ENCOUNTER — Ambulatory Visit: Admitting: Podiatry

## 2024-03-01 ENCOUNTER — Ambulatory Visit: Admitting: Cardiology

## 2024-03-04 ENCOUNTER — Ambulatory Visit: Admitting: Podiatry
